# Patient Record
Sex: Male | Born: 1955 | Race: White | Hispanic: No | Marital: Single | Smoking: Never smoker
Health system: Southern US, Community
[De-identification: ages and names within clinical notes are randomized; demographics above are authoritative.]

## PROBLEM LIST (undated history)

## (undated) DIAGNOSIS — F419 Anxiety disorder, unspecified: Secondary | ICD-10-CM

## (undated) DIAGNOSIS — C801 Malignant (primary) neoplasm, unspecified: Secondary | ICD-10-CM

## (undated) DIAGNOSIS — K219 Gastro-esophageal reflux disease without esophagitis: Secondary | ICD-10-CM

## (undated) DIAGNOSIS — M169 Osteoarthritis of hip, unspecified: Secondary | ICD-10-CM

## (undated) DIAGNOSIS — E119 Type 2 diabetes mellitus without complications: Secondary | ICD-10-CM

## (undated) DIAGNOSIS — I509 Heart failure, unspecified: Secondary | ICD-10-CM

## (undated) DIAGNOSIS — K279 Peptic ulcer, site unspecified, unspecified as acute or chronic, without hemorrhage or perforation: Secondary | ICD-10-CM

## (undated) DIAGNOSIS — I10 Essential (primary) hypertension: Secondary | ICD-10-CM

## (undated) DIAGNOSIS — E669 Obesity, unspecified: Secondary | ICD-10-CM

## (undated) DIAGNOSIS — D509 Iron deficiency anemia, unspecified: Secondary | ICD-10-CM

## (undated) HISTORY — PX: GASTRIC BYPASS: SHX52

## (undated) HISTORY — PX: NASAL SINUS SURGERY: SHX719

## (undated) HISTORY — DX: Heart failure, unspecified: I50.9

## (undated) HISTORY — PX: COLONOSCOPY: SHX174

## (undated) HISTORY — DX: Essential (primary) hypertension: I10

## (undated) HISTORY — PX: ESOPHAGOGASTRODUODENOSCOPY: SHX1529

---

## 2015-03-25 ENCOUNTER — Emergency Department: Payer: 59

## 2015-03-25 ENCOUNTER — Emergency Department
Admission: EM | Admit: 2015-03-25 | Discharge: 2015-03-25 | Disposition: A | Discharge status: Home / Self Care | Payer: 59 | Attending: Emergency Medicine | Admitting: Emergency Medicine

## 2015-03-25 ENCOUNTER — Encounter: Payer: Self-pay | Admitting: Occupational Medicine

## 2015-03-25 DIAGNOSIS — Y998 Other external cause status: Secondary | ICD-10-CM | POA: Diagnosis not present

## 2015-03-25 DIAGNOSIS — M5431 Sciatica, right side: Secondary | ICD-10-CM | POA: Diagnosis not present

## 2015-03-25 DIAGNOSIS — M4726 Other spondylosis with radiculopathy, lumbar region: Secondary | ICD-10-CM | POA: Diagnosis not present

## 2015-03-25 DIAGNOSIS — W010XXA Fall on same level from slipping, tripping and stumbling without subsequent striking against object, initial encounter: Secondary | ICD-10-CM | POA: Diagnosis not present

## 2015-03-25 DIAGNOSIS — S90122A Contusion of left lesser toe(s) without damage to nail, initial encounter: Secondary | ICD-10-CM | POA: Diagnosis not present

## 2015-03-25 DIAGNOSIS — Y9389 Activity, other specified: Secondary | ICD-10-CM | POA: Diagnosis not present

## 2015-03-25 DIAGNOSIS — E119 Type 2 diabetes mellitus without complications: Secondary | ICD-10-CM | POA: Insufficient documentation

## 2015-03-25 DIAGNOSIS — Y9289 Other specified places as the place of occurrence of the external cause: Secondary | ICD-10-CM | POA: Diagnosis not present

## 2015-03-25 DIAGNOSIS — S3992XA Unspecified injury of lower back, initial encounter: Secondary | ICD-10-CM | POA: Diagnosis present

## 2015-03-25 HISTORY — DX: Type 2 diabetes mellitus without complications: E11.9

## 2015-03-25 MED ORDER — FENTANYL CITRATE (PF) 100 MCG/2ML IJ SOLN
100.0000 ug | Freq: Once | INTRAMUSCULAR | Status: AC
  Administered 2015-03-25: 100 ug via INTRAVENOUS

## 2015-03-25 MED ORDER — OXYCODONE-ACETAMINOPHEN 5-325 MG PO TABS
ORAL_TABLET | ORAL | Status: AC
  Administered 2015-03-25: 2 via ORAL
  Filled 2015-03-25: qty 2

## 2015-03-25 MED ORDER — OXYCODONE-ACETAMINOPHEN 5-325 MG PO TABS
2.0000 | ORAL_TABLET | Freq: Once | ORAL | Status: AC
  Administered 2015-03-25: 2 via ORAL

## 2015-03-25 MED ORDER — FENTANYL CITRATE (PF) 100 MCG/2ML IJ SOLN
INTRAMUSCULAR | Status: AC
  Administered 2015-03-25: 100 ug via INTRAVENOUS
  Filled 2015-03-25: qty 2

## 2015-03-25 MED ORDER — ONDANSETRON HCL 4 MG/2ML IJ SOLN
4.0000 mg | Freq: Once | INTRAMUSCULAR | Status: AC
  Administered 2015-03-25: 4 mg via INTRAVENOUS
  Filled 2015-03-25: qty 2

## 2015-03-25 MED ORDER — OXYCODONE-ACETAMINOPHEN 5-325 MG PO TABS: 2.0000 | ORAL_TABLET | Freq: Four times a day (QID) | ORAL | Status: DC | PRN

## 2015-03-25 MED ORDER — FENTANYL CITRATE (PF) 100 MCG/2ML IJ SOLN
50.0000 ug | Freq: Once | INTRAMUSCULAR | Status: AC
  Administered 2015-03-25: 50 ug via INTRAVENOUS
  Filled 2015-03-25: qty 2

## 2015-03-25 NOTE — ED Provider Notes (Signed)
[facility] Emergency Department Provider Note  ____________________________________________  Time seen: 5:30 AM  I have reviewed the triage vital signs and the nursing notes.   HISTORY  Chief Complaint Back Pain      HPI William Gorter. is a 59 y.o. male presents status post accidental trip and fall with 10 out of 10 low back pain that radiating down the posterior right leg as well as left third toe pain. Patient denies any weakness no numbness.     Past medical history Obesity  Past surgical history Gastric bypass  Allergies Opium  No family history on file.  Social History History  Substance Use Topics  . Smoking status: Not on file  . Smokeless tobacco: Not on file  . Alcohol Use: Not on file    Review of Systems  Constitutional: Negative for fever. Eyes: Negative for visual changes. ENT: Negative for sore throat. Cardiovascular: Negative for chest pain. Respiratory: Negative for shortness of breath. Gastrointestinal: Negative for abdominal pain, vomiting and diarrhea. Genitourinary: Negative for dysuria. Musculoskeletal: Positive for back pain and left third toe pain Skin: Negative for rash. Neurological: Negative for headaches, focal weakness or numbness.   10-point ROS otherwise negative.  ____________________________________________   PHYSICAL EXAM:  VITAL SIGNS: ED Triage Vitals  Enc Vitals Group     BP 03/25/15 0428 134/89 mmHg     Pulse Rate 03/25/15 0428 74     Resp --      Temp 03/25/15 0428 98.2 F (36.8 C)     Temp Source 03/25/15 0428 Oral     SpO2 03/25/15 0428 97 %     Weight 03/25/15 0428 298 lb (135.172 kg)     Height 03/25/15 0428 5\' 11"  (1.803 m)     Head Cir --      Peak Flow --      Pain Score 03/25/15 0430 10     Pain Loc --      Pain Edu? --      Excl. in [place]? --      Constitutional: Alert and oriented. Apparent distress Eyes: Conjunctivae are normal. PERRL. Normal extraocular  movements. ENT   Head: Normocephalic and atraumatic.   Nose: No congestion/rhinnorhea.   Mouth/Throat: Mucous membranes are moist.   Neck: No stridor. Hematological/Lymphatic/Immunilogical: No cervical lymphadenopathy. Cardiovascular: Normal rate, regular rhythm. Normal and symmetric distal pulses are present in all extremities. No murmurs, rubs, or gallops. Respiratory: Normal respiratory effort without tachypnea nor retractions. Breath sounds are clear and equal bilaterally. No wheezes/rales/rhonchi. Gastrointestinal: Soft and nontender. No distention. There is no CVA tenderness. Genitourinary: deferred Musculoskeletal: Nontender with normal range of motion in all extremities. No joint effusions.  No lower extremity tenderness nor edema. Neurologic:  Normal speech and language. No gross focal neurologic deficits are appreciated. Speech is normal.  Skin:  Skin is warm, dry and intact. No rash noted. Psychiatric: Mood and affect are normal. Speech and behavior are normal. Patient exhibits appropriate insight and judgment.  ____________________________________________     RADIOLOGY Narrative:    CLINICAL DATA: Fall last night, shooting back pain.  EXAM: LUMBAR SPINE - 2-3 VIEW  COMPARISON: None.  FINDINGS: Lumbar vertebral bodies intact, maintenance of lumbar lordosis. Mild L4-5, moderate L5-S1 disc height loss, endplate spurring consistent with degenerative disc. At Least mild facet arthropathy. No destructive bony lesions. Sacroiliac joints are symmetric. Large amount of retained large bowel stool, apparent bowel surgical anastomosis LEFT mid abdomen. Surgical clips at the GE junction.  IMPRESSION:  Degenerative lumbar spine without acute fracture deformity or malalignment.  Large amount of retained large bowel stool partially imaged.   Electronically Signed By: Elon Alas M.D. On: 03/25/2015 06:40          DG Foot Complete Left (Final  result) Result time: 03/25/15 06:38:14   Final result by Rad Results In Interface (03/25/15 06:38:14)   Narrative:   CLINICAL DATA: Fall last night, third digit injury, swelling.  EXAM: LEFT FOOT - COMPLETE 3+ VIEW  COMPARISON: None.  FINDINGS: There is no evidence of fracture or dislocation. Os perineum. Moderate plantar calcaneal spur. There is no evidence of arthropathy or other focal bone abnormality. Soft tissues are unremarkable.  IMPRESSION: Negative.   Electronically Signed By: Elon Alas M.D. On: 03/25/2015 06:38     INITIAL IMPRESSION / ASSESSMENT AND PLAN / ED COURSE  Pertinent labs & imaging results that were available during my care of the patient were reviewed by me and considered in my medical decision making (see chart for details).  Patient received IV fentanyl 25 mcg prior to my evaluation with minimal pain relief as such fentanyl 50 mcg given pain improvement.  ____________________________________________   FINAL CLINICAL IMPRESSION(S) / ED DIAGNOSES  Final diagnoses:  Sciatica, right  Toe contusion, left, initial encounter  Osteoarthritis of spine with radiculopathy, lumbar region      Gregor Hams, MD 03/25/15 [phone]

## 2015-03-25 NOTE — ED Notes (Signed)
Pt to ED via EMS c/o fall. C/o lower back pain and L 3rd toe pain.

## 2015-03-25 NOTE — Discharge Instructions (Signed)
Sciatica Sciatica is pain, weakness, numbness, or tingling along the path of the sciatic nerve. The nerve starts in the lower back and runs down the back of each leg. The nerve controls the muscles in the lower leg and in the back of the knee, while also providing sensation to the back of the thigh, lower leg, and the sole of your foot. Sciatica is a symptom of another medical condition. For instance, nerve damage or certain conditions, such as a herniated disk or bone spur on the spine, pinch or put pressure on the sciatic nerve. This causes the pain, weakness, or other sensations normally associated with sciatica. Generally, sciatica only affects one side of the body. CAUSES   Herniated or slipped disc.  Degenerative disk disease.  A pain disorder involving the narrow muscle in the buttocks (piriformis syndrome).  Pelvic injury or fracture.  Pregnancy.  Tumor (rare). SYMPTOMS  Symptoms can vary from mild to very severe. The symptoms usually travel from the low back to the buttocks and down the back of the leg. Symptoms can include:  Mild tingling or dull aches in the lower back, leg, or hip.  Numbness in the back of the calf or sole of the foot.  Burning sensations in the lower back, leg, or hip.  Sharp pains in the lower back, leg, or hip.  Leg weakness.  Severe back pain inhibiting movement. These symptoms may get worse with coughing, sneezing, laughing, or prolonged sitting or standing. Also, being overweight may worsen symptoms. DIAGNOSIS  Your caregiver will perform a physical exam to look for common symptoms of sciatica. He or she may ask you to do certain movements or activities that would trigger sciatic nerve pain. Other tests may be performed to find the cause of the sciatica. These may include:  Blood tests.  X-rays.  Imaging tests, such as an MRI or CT scan. TREATMENT  Treatment is directed at the cause of the sciatic pain. Sometimes, treatment is not necessary  and the pain and discomfort goes away on its own. If treatment is needed, your caregiver may suggest:  Over-the-counter medicines to relieve pain.  Prescription medicines, such as anti-inflammatory medicine, muscle relaxants, or narcotics.  Applying heat or ice to the painful area.  Steroid injections to lessen pain, irritation, and inflammation around the nerve.  Reducing activity during periods of pain.  Exercising and stretching to strengthen your abdomen and improve flexibility of your spine. Your caregiver may suggest losing weight if the extra weight makes the back pain worse.  Physical therapy.  Surgery to eliminate what is pressing or pinching the nerve, such as a bone spur or part of a herniated disk. HOME CARE INSTRUCTIONS   Only take over-the-counter or prescription medicines for pain or discomfort as directed by your caregiver.  Apply ice to the affected area for 20 minutes, 3-4 times a day for the first 48-72 hours. Then try heat in the same way.  Exercise, stretch, or perform your usual activities if these do not aggravate your pain.  Attend physical therapy sessions as directed by your caregiver.  Keep all follow-up appointments as directed by your caregiver.  Do not wear high heels or shoes that do not provide proper support.  Check your mattress to see if it is too soft. A firm mattress may lessen your pain and discomfort. SEEK IMMEDIATE MEDICAL CARE IF:   You lose control of your bowel or bladder (incontinence).  You have increasing weakness in the lower back, pelvis, buttocks,   or legs.  You have redness or swelling of your back.  You have a burning sensation when you urinate.  You have pain that gets worse when you lie down or awakens you at night.  Your pain is worse than you have experienced in the past.  Your pain is lasting longer than 4 weeks.  You are suddenly losing weight without reason. MAKE SURE YOU:  Understand these  instructions.  Will watch your condition.  Will get help right away if you are not doing well or get worse. Document Released: 08/20/2001 Document Revised: 02/25/2012 Document Reviewed: 01/05/2012 Exit[employer] Patient Information 2015 [employer]. This information is not intended to replace advice given to you by your health care provider. Make sure you discuss any questions you have with your health care provider.  

## 2015-08-29 ENCOUNTER — Other Ambulatory Visit: Payer: Self-pay | Admitting: Internal Medicine

## 2015-08-29 DIAGNOSIS — M5430 Sciatica, unspecified side: Secondary | ICD-10-CM

## 2015-08-29 DIAGNOSIS — M549 Dorsalgia, unspecified: Secondary | ICD-10-CM

## 2015-10-03 ENCOUNTER — Ambulatory Visit
Admission: RE | Admit: 2015-10-03 | Discharge: 2015-10-03 | Disposition: A | Discharge status: Home / Self Care | Payer: 59 | Source: Ambulatory Visit | Attending: Internal Medicine | Admitting: Internal Medicine

## 2015-10-03 DIAGNOSIS — M549 Dorsalgia, unspecified: Secondary | ICD-10-CM | POA: Insufficient documentation

## 2015-10-03 DIAGNOSIS — M5430 Sciatica, unspecified side: Secondary | ICD-10-CM | POA: Insufficient documentation

## 2016-08-07 ENCOUNTER — Encounter
Admission: RE | Admit: 2016-08-07 | Discharge: 2016-08-07 | Disposition: A | Discharge status: Home / Self Care | Payer: 59 | Source: Ambulatory Visit | Attending: Orthopedic Surgery | Admitting: Orthopedic Surgery

## 2016-08-07 DIAGNOSIS — Z01818 Encounter for other preprocedural examination: Secondary | ICD-10-CM | POA: Diagnosis not present

## 2016-08-07 DIAGNOSIS — E119 Type 2 diabetes mellitus without complications: Secondary | ICD-10-CM | POA: Insufficient documentation

## 2016-08-07 HISTORY — DX: Obesity, unspecified: E66.9

## 2016-08-07 HISTORY — DX: Gastro-esophageal reflux disease without esophagitis: K21.9

## 2016-08-07 HISTORY — DX: Osteoarthritis of hip, unspecified: M16.9

## 2016-08-07 HISTORY — DX: Iron deficiency anemia, unspecified: D50.9

## 2016-08-07 HISTORY — DX: Peptic ulcer, site unspecified, unspecified as acute or chronic, without hemorrhage or perforation: K27.9

## 2016-08-07 HISTORY — DX: Anxiety disorder, unspecified: F41.9

## 2016-08-07 LAB — CBC
HCT: 46 % (ref 40.0–52.0)
Hemoglobin: 15.2 g/dL (ref 13.0–18.0)
MCH: 29.2 pg (ref 26.0–34.0)
MCHC: 33.1 g/dL (ref 32.0–36.0)
MCV: 88 fL (ref 80.0–100.0)
PLATELETS: 223 10*3/uL (ref 150–440)
RBC: 5.22 MIL/uL (ref 4.40–5.90)
RDW: 15.4 % — AB (ref 11.5–14.5)
WBC: 8 10*3/uL (ref 3.8–10.6)

## 2016-08-07 LAB — BASIC METABOLIC PANEL
Anion gap: 6 (ref 5–15)
BUN: 15 mg/dL (ref 6–20)
CO2: 30 mmol/L (ref 22–32)
CREATININE: 0.68 mg/dL (ref 0.61–1.24)
Calcium: 9.2 mg/dL (ref 8.9–10.3)
Chloride: 102 mmol/L (ref 101–111)
GFR calc Af Amer: >60 mL/min (ref >60)
GFR calc non Af Amer: >60 mL/min (ref >60)
GLUCOSE: 97 mg/dL (ref 65–99)
Potassium: 4.4 mmol/L (ref 3.5–5.1)
Sodium: 138 mmol/L (ref 135–145)

## 2016-08-07 LAB — URINALYSIS COMPLETE WITH MICROSCOPIC (ARMC ONLY)
Bacteria, UA: NONE SEEN
Bilirubin Urine: NEGATIVE
Glucose, UA: NEGATIVE mg/dL
HGB URINE DIPSTICK: NEGATIVE
KETONES UR: NEGATIVE mg/dL
LEUKOCYTES UA: NEGATIVE
NITRITE: NEGATIVE
PH: 5 (ref 5.0–8.0)
PROTEIN: NEGATIVE mg/dL
SPECIFIC GRAVITY, URINE: 1.02 (ref 1.005–1.030)
Squamous Epithelial / LPF: NONE SEEN

## 2016-08-07 LAB — TYPE AND SCREEN
ABO/RH(D): A POS
Antibody Screen: NEGATIVE

## 2016-08-07 LAB — APTT: aPTT: 31 seconds (ref 24–36)

## 2016-08-07 LAB — SURGICAL PCR SCREEN
MRSA, PCR: NEGATIVE
Staphylococcus aureus: POSITIVE — AB

## 2016-08-07 LAB — PROTIME-INR
INR: 0.94
Prothrombin Time: 12.6 seconds (ref 11.4–15.2)

## 2016-08-07 LAB — SEDIMENTATION RATE: Sed Rate: 6 mm/hr (ref 0–20)

## 2016-08-07 NOTE — Patient Instructions (Addendum)
  Your procedure is scheduled on: Tuesday, August 20, 2016 Report to Same Day Surgery 2nd floor medical mall ([place] Entrance-take elevator on left to 2nd floor.  Check in with surgery information desk.) To find out your arrival time please call [phone] between 1PM - 3PM on Monday, December 11th  Remember: Instructions that are not followed completely may result in serious medical risk, up to and including death, or upon the discretion of your surgeon and anesthesiologist your surgery may need to be rescheduled.    _x___ 1. Do not eat food or drink liquids after midnight. No gum chewing or hard candies.     __x__ 2. No Alcohol for 24 hours before or after surgery.   __x__3. No Smoking for 24 prior to surgery.   ____  4. Bring all medications with you on the day of surgery if instructed.    __x__ 5. Notify your doctor if there is any change in your medical condition     (cold, fever, infections).     Do not wear jewelry, make-up, hairpins, clips or nail polish.  Do not wear lotions, powders, or perfumes. You may wear deodorant.  Do not shave 48 hours prior to surgery. Men may shave face and neck.  Do not bring valuables to the hospital.    [facility] is not responsible for any belongings or valuables.               Contacts, dentures or bridgework may not be worn into surgery.  Leave your suitcase in the car. After surgery it may be brought to your room.  For patients admitted to the hospital, discharge time is determined by your treatment team.   Patients discharged the day of surgery will not be allowed to drive home.  You will need someone to drive you home and stay with you the night of your procedure.    Please read over the following fact sheets that you were given:   [facility] Preparing for Surgery and or MRSA Information   _x___ Take these medicines the morning of surgery with A SIP OF WATER:    1.  PROTONIX (TAKE 1 TABLET THE NIGHT BEFORE AND 1 TABLET THE  MORNING OF SURGERY)  2.  TRAMADOL  3.  4.  5.  6.  ____Fleets enema or Magnesium Citrate as directed.   _x___ Use CHG Soap or sage wipes as directed on instruction sheet   ____ Use inhalers on the day of surgery and bring to hospital day of surgery  __X__ Stop metformin 2 days prior to surgery. LAST DOSE WILL BE ON Saturday, December 9th.   ____ Take 1/2 of usual insulin dose the night before surgery and none on the morning of           surgery.   ____ Stop Aspirin, Coumadin, Pllavix ,Eliquis, Effient, or Pradaxa  x__ Stop Anti-inflammatories such as Advil, Aleve, Ibuprofen, Motrin, Naproxen,          Naprosyn, Goodies powders or aspirin products. Ok to take Tylenol.   ____ Stop supplements until after surgery.    ____ Bring C-Pap to the hospital.

## 2016-08-07 NOTE — Pre-Procedure Instructions (Signed)
MEDICAL CLEARANCE REQUEST/EKG,AS INSTRUCTED BY DR J ADAMS CALLED AND FAXED TO DR B Caryl Comes. SPOKE WITH CONNIE ALSO FAXED AND CALLED TO CINDY AT DR [facility]

## 2016-08-08 LAB — URINE CULTURE: CULTURE: NO GROWTH

## 2016-08-08 NOTE — Pre-Procedure Instructions (Signed)
Positive staph called to dr Theodore Demark office No LMP for male patient. for William [name]

## 2016-08-13 NOTE — Pre-Procedure Instructions (Signed)
Referred by dr b Caryl Comes to cardiology and seen by dr Nehemiah Massed with echo done 08/12/16. To have stress test and see dr Nehemiah Massed again 08/15/16

## 2016-08-19 MED ORDER — TRANEXAMIC ACID 1000 MG/10ML IV SOLN
1500.0000 mg | INTRAVENOUS | Status: AC
  Administered 2016-08-20: 1500 mg via INTRAVENOUS
  Filled 2016-08-19: qty 15

## 2016-08-19 MED ORDER — CEFAZOLIN SODIUM-DEXTROSE 2-4 GM/100ML-% IV SOLN
2.0000 g | Freq: Once | INTRAVENOUS | Status: AC
  Administered 2016-08-20: 2 g via INTRAVENOUS

## 2016-08-19 NOTE — Pre-Procedure Instructions (Signed)
CLEARED BY DR KOWALSKI LOW RISK 

## 2016-08-20 ENCOUNTER — Encounter: Payer: Self-pay | Admitting: Anesthesiology

## 2016-08-20 ENCOUNTER — Inpatient Hospital Stay: Payer: 59 | Admitting: Certified Registered Nurse Anesthetist

## 2016-08-20 ENCOUNTER — Inpatient Hospital Stay: Payer: 59

## 2016-08-20 ENCOUNTER — Inpatient Hospital Stay
Admission: RE | Admit: 2016-08-20 | Discharge: 2016-08-22 | DRG: 470 | Disposition: A | Discharge status: Skilled Nursing Facility | Payer: 59 | Source: Ambulatory Visit | Attending: Orthopedic Surgery | Admitting: Orthopedic Surgery

## 2016-08-20 ENCOUNTER — Encounter
Admission: RE | Disposition: A | Discharge status: Skilled Nursing Facility | Payer: Self-pay | Source: Ambulatory Visit | Attending: Orthopedic Surgery

## 2016-08-20 DIAGNOSIS — Z8711 Personal history of peptic ulcer disease: Secondary | ICD-10-CM | POA: Diagnosis not present

## 2016-08-20 DIAGNOSIS — M1611 Unilateral primary osteoarthritis, right hip: Principal | ICD-10-CM | POA: Diagnosis present

## 2016-08-20 DIAGNOSIS — F419 Anxiety disorder, unspecified: Secondary | ICD-10-CM | POA: Diagnosis present

## 2016-08-20 DIAGNOSIS — M6281 Muscle weakness (generalized): Secondary | ICD-10-CM

## 2016-08-20 DIAGNOSIS — D509 Iron deficiency anemia, unspecified: Secondary | ICD-10-CM | POA: Diagnosis present

## 2016-08-20 DIAGNOSIS — R262 Difficulty in walking, not elsewhere classified: Secondary | ICD-10-CM

## 2016-08-20 DIAGNOSIS — Z419 Encounter for procedure for purposes other than remedying health state, unspecified: Secondary | ICD-10-CM

## 2016-08-20 DIAGNOSIS — Z9884 Bariatric surgery status: Secondary | ICD-10-CM | POA: Diagnosis not present

## 2016-08-20 DIAGNOSIS — G8918 Other acute postprocedural pain: Secondary | ICD-10-CM

## 2016-08-20 DIAGNOSIS — E669 Obesity, unspecified: Secondary | ICD-10-CM | POA: Diagnosis present

## 2016-08-20 DIAGNOSIS — E119 Type 2 diabetes mellitus without complications: Secondary | ICD-10-CM | POA: Diagnosis present

## 2016-08-20 DIAGNOSIS — D62 Acute posthemorrhagic anemia: Secondary | ICD-10-CM | POA: Diagnosis not present

## 2016-08-20 DIAGNOSIS — M25551 Pain in right hip: Secondary | ICD-10-CM | POA: Diagnosis present

## 2016-08-20 DIAGNOSIS — Z7984 Long term (current) use of oral hypoglycemic drugs: Secondary | ICD-10-CM | POA: Diagnosis not present

## 2016-08-20 DIAGNOSIS — Z833 Family history of diabetes mellitus: Secondary | ICD-10-CM | POA: Diagnosis not present

## 2016-08-20 DIAGNOSIS — K219 Gastro-esophageal reflux disease without esophagitis: Secondary | ICD-10-CM | POA: Diagnosis present

## 2016-08-20 DIAGNOSIS — Z6839 Body mass index (BMI) 39.0-39.9, adult: Secondary | ICD-10-CM | POA: Diagnosis not present

## 2016-08-20 HISTORY — PX: TOTAL HIP ARTHROPLASTY: SHX124

## 2016-08-20 LAB — GLUCOSE, CAPILLARY
GLUCOSE-CAPILLARY: 155 mg/dL — AB (ref 65–99)
Glucose-Capillary: 110 mg/dL — ABNORMAL HIGH (ref 65–99)
Glucose-Capillary: 107 mg/dL — ABNORMAL HIGH (ref 65–99)
Glucose-Capillary: 153 mg/dL — ABNORMAL HIGH (ref 65–99)

## 2016-08-20 LAB — CBC
HEMATOCRIT: 40.2 % (ref 40.0–52.0)
Hemoglobin: 13.8 g/dL (ref 13.0–18.0)
MCH: 29.5 pg (ref 26.0–34.0)
MCHC: 34.3 g/dL (ref 32.0–36.0)
MCV: 85.9 fL (ref 80.0–100.0)
PLATELETS: 203 10*3/uL (ref 150–440)
RBC: 4.68 MIL/uL (ref 4.40–5.90)
RDW: 15.2 % — AB (ref 11.5–14.5)
WBC: 9.4 10*3/uL (ref 3.8–10.6)

## 2016-08-20 LAB — CREATININE, SERUM
Creatinine, Ser: 0.6 mg/dL — ABNORMAL LOW (ref 0.61–1.24)
GFR calc non Af Amer: >60 mL/min (ref >60)

## 2016-08-20 LAB — ABO/RH: ABO/RH(D): A POS

## 2016-08-20 SURGERY — ARTHROPLASTY, HIP, TOTAL, ANTERIOR APPROACH
Anesthesia: Spinal | Laterality: Right | Wound class: Clean

## 2016-08-20 MED ORDER — ACETAMINOPHEN 10 MG/ML IV SOLN
INTRAVENOUS | Status: DC | PRN
  Administered 2016-08-20: 1000 mg via INTRAVENOUS

## 2016-08-20 MED ORDER — NEOMYCIN-POLYMYXIN B GU 40-200000 IR SOLN
Status: DC | PRN
  Administered 2016-08-20: 4 mL

## 2016-08-20 MED ORDER — VITAMIN B-12 1000 MCG PO TABS
2500.0000 ug | ORAL_TABLET | Freq: Every day | ORAL | Status: DC
  Administered 2016-08-21 – 2016-08-22 (×2): 2500 ug via ORAL
  Filled 2016-08-20 (×2): qty 3

## 2016-08-20 MED ORDER — BUPIVACAINE-EPINEPHRINE (PF) 0.25% -1:200000 IJ SOLN
INTRAMUSCULAR | Status: AC
  Filled 2016-08-20: qty 30

## 2016-08-20 MED ORDER — ZOLPIDEM TARTRATE 5 MG PO TABS
5.0000 mg | ORAL_TABLET | Freq: Every evening | ORAL | Status: DC | PRN
Start: 2016-08-20 — End: 2016-08-22
  Administered 2016-08-21 (×2): 5 mg via ORAL
  Filled 2016-08-20 (×2): qty 1

## 2016-08-20 MED ORDER — MAGNESIUM HYDROXIDE 400 MG/5ML PO SUSP
30.0000 mL | Freq: Every day | ORAL | Status: DC | PRN
  Administered 2016-08-21 – 2016-08-22 (×2): 30 mL via ORAL
  Filled 2016-08-20 (×2): qty 30

## 2016-08-20 MED ORDER — METHOCARBAMOL 500 MG PO TABS
500.0000 mg | ORAL_TABLET | Freq: Four times a day (QID) | ORAL | Status: DC | PRN
  Administered 2016-08-20 – 2016-08-21 (×5): 500 mg via ORAL
  Filled 2016-08-20 (×5): qty 1

## 2016-08-20 MED ORDER — ADULT MULTIVITAMIN W/MINERALS CH
1.0000 | ORAL_TABLET | Freq: Every day | ORAL | Status: DC
  Administered 2016-08-21 – 2016-08-22 (×2): 1 via ORAL
  Filled 2016-08-20 (×2): qty 1

## 2016-08-20 MED ORDER — FAMOTIDINE 20 MG PO TABS
20.0000 mg | ORAL_TABLET | Freq: Once | ORAL | Status: AC
  Administered 2016-08-20: 20 mg via ORAL

## 2016-08-20 MED ORDER — FLUOXETINE HCL 20 MG PO CAPS
20.0000 mg | ORAL_CAPSULE | Freq: Every evening | ORAL | Status: DC
  Administered 2016-08-20 – 2016-08-21 (×2): 20 mg via ORAL
  Filled 2016-08-20 (×2): qty 1

## 2016-08-20 MED ORDER — KETOROLAC TROMETHAMINE 15 MG/ML IJ SOLN
15.0000 mg | Freq: Once | INTRAMUSCULAR | Status: AC
  Administered 2016-08-20: 15 mg via INTRAVENOUS
  Filled 2016-08-20: qty 1

## 2016-08-20 MED ORDER — DIPHENHYDRAMINE HCL 12.5 MG/5ML PO ELIX
12.5000 mg | ORAL_SOLUTION | ORAL | Status: DC | PRN
Start: 2016-08-20 — End: 2016-08-22

## 2016-08-20 MED ORDER — METOCLOPRAMIDE HCL 10 MG PO TABS: 5.0000 mg | ORAL_TABLET | Freq: Three times a day (TID) | ORAL | Status: DC | PRN

## 2016-08-20 MED ORDER — SODIUM CHLORIDE 0.9 % IV SOLN
INTRAVENOUS | Status: DC
  Administered 2016-08-20 (×2): via INTRAVENOUS

## 2016-08-20 MED ORDER — KETAMINE HCL 50 MG/ML IJ SOLN
INTRAMUSCULAR | Status: DC | PRN
  Administered 2016-08-20: 25 mg via INTRAMUSCULAR

## 2016-08-20 MED ORDER — DOCUSATE SODIUM 100 MG PO CAPS
100.0000 mg | ORAL_CAPSULE | Freq: Two times a day (BID) | ORAL | Status: DC
  Administered 2016-08-20 – 2016-08-22 (×4): 100 mg via ORAL
  Filled 2016-08-20 (×4): qty 1

## 2016-08-20 MED ORDER — OXYCODONE HCL ER 20 MG PO T12A
20.0000 mg | EXTENDED_RELEASE_TABLET | Freq: Two times a day (BID) | ORAL | Status: DC
Start: 2016-08-20 — End: 2016-08-22
  Administered 2016-08-20 – 2016-08-22 (×4): 20 mg via ORAL
  Filled 2016-08-20 (×4): qty 1

## 2016-08-20 MED ORDER — ONDANSETRON HCL 4 MG/2ML IJ SOLN
INTRAMUSCULAR | Status: DC | PRN
  Administered 2016-08-20: 4 mg via INTRAVENOUS

## 2016-08-20 MED ORDER — FERROUS SULFATE 325 (65 FE) MG PO TABS
54.0000 mg | ORAL_TABLET | Freq: Every day | ORAL | Status: DC
  Administered 2016-08-21 – 2016-08-22 (×2): 325 mg via ORAL
  Filled 2016-08-20 (×2): qty 1

## 2016-08-20 MED ORDER — ACETAMINOPHEN 325 MG PO TABS: 650.0000 mg | ORAL_TABLET | Freq: Four times a day (QID) | ORAL | Status: DC | PRN

## 2016-08-20 MED ORDER — PROPOFOL 10 MG/ML IV BOLUS
INTRAVENOUS | Status: DC | PRN
  Administered 2016-08-20 (×2): 30 mg via INTRAVENOUS
  Administered 2016-08-20: 20 mg via INTRAVENOUS

## 2016-08-20 MED ORDER — FENTANYL CITRATE (PF) 100 MCG/2ML IJ SOLN
25.0000 ug | INTRAMUSCULAR | Status: DC | PRN
  Administered 2016-08-20 (×2): 25 ug via INTRAVENOUS

## 2016-08-20 MED ORDER — HYDROMORPHONE HCL 1 MG/ML IJ SOLN
1.0000 mg | INTRAMUSCULAR | Status: DC | PRN
  Administered 2016-08-20 – 2016-08-21 (×3): 1 mg via INTRAVENOUS
  Filled 2016-08-20 (×3): qty 1

## 2016-08-20 MED ORDER — ONDANSETRON HCL 4 MG/2ML IJ SOLN: 4.0000 mg | Freq: Four times a day (QID) | INTRAMUSCULAR | Status: DC | PRN

## 2016-08-20 MED ORDER — BIOTIN 10000 MCG PO TABS: 10000.0000 ug | ORAL_TABLET | Freq: Every day | ORAL | Status: DC

## 2016-08-20 MED ORDER — ACETAMINOPHEN 10 MG/ML IV SOLN
INTRAVENOUS | Status: AC
  Filled 2016-08-20: qty 100

## 2016-08-20 MED ORDER — CEFAZOLIN SODIUM-DEXTROSE 2-4 GM/100ML-% IV SOLN
INTRAVENOUS | Status: AC
  Filled 2016-08-20: qty 100

## 2016-08-20 MED ORDER — BISACODYL 10 MG RE SUPP
10.0000 mg | Freq: Every day | RECTAL | Status: DC | PRN
  Administered 2016-08-22: 10 mg via RECTAL
  Filled 2016-08-20: qty 1

## 2016-08-20 MED ORDER — METFORMIN HCL 500 MG PO TABS: 500.0000 mg | ORAL_TABLET | Freq: Two times a day (BID) | ORAL | Status: DC

## 2016-08-20 MED ORDER — ONDANSETRON HCL 4 MG/2ML IJ SOLN: 4.0000 mg | Freq: Once | INTRAMUSCULAR | Status: DC | PRN

## 2016-08-20 MED ORDER — PHENYLEPHRINE HCL 10 MG/ML IJ SOLN
INTRAMUSCULAR | Status: DC | PRN
  Administered 2016-08-20: 100 ug via INTRAVENOUS
  Administered 2016-08-20: 50 ug via INTRAVENOUS
  Administered 2016-08-20: 100 ug via INTRAVENOUS

## 2016-08-20 MED ORDER — LIDOCAINE HCL (CARDIAC) 20 MG/ML IV SOLN
INTRAVENOUS | Status: DC | PRN
  Administered 2016-08-20: 50 mg via INTRAVENOUS

## 2016-08-20 MED ORDER — FENTANYL CITRATE (PF) 100 MCG/2ML IJ SOLN
INTRAMUSCULAR | Status: AC
  Administered 2016-08-20: 25 ug via INTRAVENOUS
  Filled 2016-08-20: qty 2

## 2016-08-20 MED ORDER — MIDAZOLAM HCL 2 MG/2ML IJ SOLN
1.0000 mg | Freq: Once | INTRAMUSCULAR | Status: AC
  Administered 2016-08-20: 1 mg via INTRAVENOUS

## 2016-08-20 MED ORDER — SODIUM CHLORIDE 0.9 % IV SOLN
INTRAVENOUS | Status: DC
  Administered 2016-08-20 (×2): via INTRAVENOUS

## 2016-08-20 MED ORDER — MIDAZOLAM HCL 2 MG/2ML IJ SOLN
INTRAMUSCULAR | Status: AC
  Administered 2016-08-20: 1 mg via INTRAVENOUS
  Filled 2016-08-20: qty 2

## 2016-08-20 MED ORDER — PANTOPRAZOLE SODIUM 40 MG PO TBEC
40.0000 mg | DELAYED_RELEASE_TABLET | Freq: Every day | ORAL | Status: DC
  Administered 2016-08-21 – 2016-08-22 (×2): 40 mg via ORAL
  Filled 2016-08-20 (×2): qty 1

## 2016-08-20 MED ORDER — OXYCODONE HCL 5 MG PO TABS
5.0000 mg | ORAL_TABLET | ORAL | Status: DC | PRN
  Administered 2016-08-20 (×3): 5 mg via ORAL
  Administered 2016-08-20 – 2016-08-22 (×6): 10 mg via ORAL
  Filled 2016-08-20: qty 2
  Filled 2016-08-20: qty 1
  Filled 2016-08-20 (×4): qty 2
  Filled 2016-08-20: qty 1
  Filled 2016-08-20 (×2): qty 2

## 2016-08-20 MED ORDER — MENTHOL 3 MG MT LOZG
1.0000 | LOZENGE | OROMUCOSAL | Status: DC | PRN
  Filled 2016-08-20: qty 9

## 2016-08-20 MED ORDER — INSULIN ASPART 100 UNIT/ML ~~LOC~~ SOLN
0.0000 [IU] | Freq: Three times a day (TID) | SUBCUTANEOUS | Status: DC
  Administered 2016-08-20 – 2016-08-21 (×2): 3 [IU] via SUBCUTANEOUS
  Administered 2016-08-21 (×2): 2 [IU] via SUBCUTANEOUS
  Administered 2016-08-22: 3 [IU] via SUBCUTANEOUS
  Filled 2016-08-20: qty 3
  Filled 2016-08-20: qty 2
  Filled 2016-08-20 (×2): qty 3
  Filled 2016-08-20: qty 2

## 2016-08-20 MED ORDER — PHENOL 1.4 % MT LIQD
1.0000 | OROMUCOSAL | Status: DC | PRN
  Filled 2016-08-20: qty 177

## 2016-08-20 MED ORDER — METFORMIN HCL 500 MG PO TABS
1000.0000 mg | ORAL_TABLET | Freq: Every day | ORAL | Status: DC
  Administered 2016-08-21 – 2016-08-22 (×2): 1000 mg via ORAL
  Filled 2016-08-20 (×2): qty 2

## 2016-08-20 MED ORDER — FENTANYL CITRATE (PF) 100 MCG/2ML IJ SOLN
INTRAMUSCULAR | Status: DC | PRN
  Administered 2016-08-20 (×2): 50 ug via INTRAVENOUS

## 2016-08-20 MED ORDER — ONDANSETRON HCL 4 MG PO TABS: 4.0000 mg | ORAL_TABLET | Freq: Four times a day (QID) | ORAL | Status: DC | PRN

## 2016-08-20 MED ORDER — FAMOTIDINE 20 MG PO TABS
ORAL_TABLET | ORAL | Status: AC
  Administered 2016-08-20: 20 mg via ORAL
  Filled 2016-08-20: qty 1

## 2016-08-20 MED ORDER — METOCLOPRAMIDE HCL 5 MG/ML IJ SOLN: 5.0000 mg | Freq: Three times a day (TID) | INTRAMUSCULAR | Status: DC | PRN

## 2016-08-20 MED ORDER — METHOCARBAMOL 1000 MG/10ML IJ SOLN
500.0000 mg | Freq: Four times a day (QID) | INTRAVENOUS | Status: DC | PRN
  Filled 2016-08-20: qty 5

## 2016-08-20 MED ORDER — BUPIVACAINE-EPINEPHRINE 0.25% -1:200000 IJ SOLN
INTRAMUSCULAR | Status: DC | PRN
  Administered 2016-08-20: 30 mL

## 2016-08-20 MED ORDER — ACETAMINOPHEN 650 MG RE SUPP: 650.0000 mg | Freq: Four times a day (QID) | RECTAL | Status: DC | PRN

## 2016-08-20 MED ORDER — CEFAZOLIN SODIUM-DEXTROSE 2-4 GM/100ML-% IV SOLN
2.0000 g | Freq: Four times a day (QID) | INTRAVENOUS | Status: AC
  Administered 2016-08-20 – 2016-08-21 (×3): 2 g via INTRAVENOUS
  Filled 2016-08-20 (×3): qty 100

## 2016-08-20 MED ORDER — FAMOTIDINE 20 MG PO TABS
20.0000 mg | ORAL_TABLET | Freq: Two times a day (BID) | ORAL | Status: DC
  Administered 2016-08-21 – 2016-08-22 (×3): 20 mg via ORAL
  Filled 2016-08-20 (×3): qty 1

## 2016-08-20 MED ORDER — MAGNESIUM CITRATE PO SOLN
1.0000 | Freq: Once | ORAL | Status: DC | PRN
  Filled 2016-08-20: qty 296

## 2016-08-20 MED ORDER — METFORMIN HCL 500 MG PO TABS
500.0000 mg | ORAL_TABLET | Freq: Every day | ORAL | Status: DC
  Administered 2016-08-20 – 2016-08-21 (×2): 500 mg via ORAL
  Filled 2016-08-20 (×2): qty 1

## 2016-08-20 MED ORDER — BUPIVACAINE IN DEXTROSE 0.75-8.25 % IT SOLN
INTRATHECAL | Status: DC | PRN
Start: 2016-08-20 — End: 2016-08-20
  Administered 2016-08-20: 1.6 mL via INTRATHECAL

## 2016-08-20 MED ORDER — NEOMYCIN-POLYMYXIN B GU 40-200000 IR SOLN
Status: AC
  Filled 2016-08-20: qty 4

## 2016-08-20 MED ORDER — MIDAZOLAM HCL 5 MG/5ML IJ SOLN
INTRAMUSCULAR | Status: DC | PRN
  Administered 2016-08-20: 2 mg via INTRAVENOUS

## 2016-08-20 MED ORDER — PROPOFOL 500 MG/50ML IV EMUL
INTRAVENOUS | Status: DC | PRN
  Administered 2016-08-20: 75 ug/kg/min via INTRAVENOUS

## 2016-08-20 MED ORDER — ENOXAPARIN SODIUM 40 MG/0.4ML ~~LOC~~ SOLN
40.0000 mg | SUBCUTANEOUS | Status: DC
  Administered 2016-08-21 – 2016-08-22 (×2): 40 mg via SUBCUTANEOUS
  Filled 2016-08-20 (×2): qty 0.4

## 2016-08-20 MED ORDER — ALUM & MAG HYDROXIDE-SIMETH 200-200-20 MG/5ML PO SUSP: 30.0000 mL | ORAL | Status: DC | PRN

## 2016-08-20 SURGICAL SUPPLY — 49 items
BLADE SAW SAG 18.5X105 (BLADE) ×2 IMPLANT
BNDG COHESIVE 6X5 TAN STRL LF (GAUZE/BANDAGES/DRESSINGS) ×4 IMPLANT
CANISTER SUCT 1200ML W/VALVE (MISCELLANEOUS) ×2 IMPLANT
CAPT HIP TOTAL 3 ×2 IMPLANT
CATH FOL LEG HOLDER (MISCELLANEOUS) ×2 IMPLANT
CATH TRAY METER 16FR LF (MISCELLANEOUS) ×2 IMPLANT
CHLORAPREP W/TINT 26ML (MISCELLANEOUS) ×2 IMPLANT
DRAPE C-ARM XRAY 36X54 (DRAPES) ×2 IMPLANT
DRAPE INCISE IOBAN 66X60 STRL (DRAPES) IMPLANT
DRAPE POUCH INSTRU U-SHP 10X18 (DRAPES) ×2 IMPLANT
DRAPE SHEET LG 3/4 BI-LAMINATE (DRAPES) ×6 IMPLANT
DRAPE STERI IOBAN 125X83 (DRAPES) ×2 IMPLANT
DRAPE TABLE BACK 80X90 (DRAPES) ×2 IMPLANT
DRSG OPSITE POSTOP 4X8 (GAUZE/BANDAGES/DRESSINGS) ×4 IMPLANT
ELECT BLADE 6.5 EXT (BLADE) ×2 IMPLANT
ELECT REM PT RETURN 9FT ADLT (ELECTROSURGICAL) ×2
ELECTRODE REM PT RTRN 9FT ADLT (ELECTROSURGICAL) ×1 IMPLANT
GAUZE SPONGE 4X4 12PLY STRL (GAUZE/BANDAGES/DRESSINGS) ×2 IMPLANT
GLOVE BIOGEL PI IND STRL 9 (GLOVE) ×1 IMPLANT
GLOVE BIOGEL PI INDICATOR 9 (GLOVE) ×1
GLOVE SURG SYN 9.0  PF PI (GLOVE) ×2
GLOVE SURG SYN 9.0 PF PI (GLOVE) ×2 IMPLANT
GOWN SRG 2XL LVL 4 RGLN SLV (GOWNS) ×1 IMPLANT
GOWN STRL NON-REIN 2XL LVL4 (GOWNS) ×1
GOWN STRL REUS W/ TWL LRG LVL3 (GOWN DISPOSABLE) ×1 IMPLANT
GOWN STRL REUS W/TWL LRG LVL3 (GOWN DISPOSABLE) ×1
HEMOVAC 400CC 10FR (MISCELLANEOUS) IMPLANT
HOOD PEEL AWAY FLYTE STAYCOOL (MISCELLANEOUS) ×2 IMPLANT
KIT PREVENA INCISION MGT 13 (CANNISTER) ×2 IMPLANT
MAT BLUE FLOOR 46X72 FLO (MISCELLANEOUS) ×2 IMPLANT
NDL SAFETY 18GX1.5 (NEEDLE) ×2 IMPLANT
NEEDLE SPNL 18GX3.5 QUINCKE PK (NEEDLE) ×2 IMPLANT
NS IRRIG 1000ML POUR BTL (IV SOLUTION) ×2 IMPLANT
PACK HIP COMPR (MISCELLANEOUS) ×2 IMPLANT
SOL PREP PVP 2OZ (MISCELLANEOUS) ×2
SOLUTION PREP PVP 2OZ (MISCELLANEOUS) ×1 IMPLANT
STAPLER SKIN PROX 35W (STAPLE) ×2 IMPLANT
STRAP SAFETY BODY (MISCELLANEOUS) ×2 IMPLANT
SUT DVC 2 QUILL PDO  T11 36X36 (SUTURE) ×1
SUT DVC 2 QUILL PDO T11 36X36 (SUTURE) ×1 IMPLANT
SUT DVC QUILL MONODERM 30X30 (SUTURE) ×2 IMPLANT
SUT SILK 0 (SUTURE) ×1
SUT SILK 0 30XBRD TIE 6 (SUTURE) ×1 IMPLANT
SUT VIC AB 1 CT1 36 (SUTURE) ×2 IMPLANT
SYR 20CC LL (SYRINGE) ×2 IMPLANT
SYR 30ML LL (SYRINGE) ×2 IMPLANT
TAPE MICROFOAM 4IN (TAPE) ×2 IMPLANT
TOWEL OR 17X26 4PK STRL BLUE (TOWEL DISPOSABLE) ×2 IMPLANT
TUBE KAMVAC SUCTION (TUBING) IMPLANT

## 2016-08-20 NOTE — Anesthesia Preprocedure Evaluation (Addendum)
Anesthesia Evaluation  Patient identified by MRN, date of birth, ID band Patient awake    Reviewed: Allergy & Precautions, NPO status , Patient's Chart, lab work & pertinent test results, reviewed documented beta blocker date and time   Airway Mallampati: III  TM Distance: >3 FB     Dental  (+) Chipped   Pulmonary           Cardiovascular      Neuro/Psych Anxiety    GI/Hepatic PUD, GERD  Controlled,  Endo/Other  diabetes, Type 2Morbid obesity  Renal/GU      Musculoskeletal  (+) Arthritis ,   Abdominal   Peds  (+) ATTENTION DEFICIT DISORDER WITHOUT HYPERACTIVITY Hematology  (+) anemia ,   Anesthesia Other Findings Bypass. Seen by Nehemiah Massed for the abnormal EKG. Echo, stress test ok. 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  Reproductive/Obstetrics                           Anesthesia Physical Anesthesia Plan  ASA: III  Anesthesia Plan: Spinal   Post-op Pain Management:    Induction:   Airway Management Planned:   Additional Equipment:   Intra-op Plan:   Post-operative Plan:   Informed Consent: I have reviewed the patients History and Physical, chart, labs and discussed the procedure including the risks, benefits and alternatives for the proposed anesthesia with the patient or authorized representative who has indicated his/her understanding and acceptance.     Plan Discussed with: CRNA  Anesthesia Plan Comments:         Anesthesia Quick Evaluation

## 2016-08-20 NOTE — Progress Notes (Signed)

## 2016-08-20 NOTE — Progress Notes (Signed)
Patient given a total of 10mg , Oxycodone, 1mg  of Dilaudid, and Robaxin 500mg , but patient still in a 10/10 pain. Family expressed concerns about something more for pain. Notified Dr. Rudene Christians. Gave a one time order for Toradol 15mg  x1 now and OxyContin 20mg  BID

## 2016-08-20 NOTE — Anesthesia Procedure Notes (Signed)
Spinal  Patient location during procedure: OR Staffing Anesthesiologist: Gunnar Bulla Performed: anesthesiologist  Preanesthetic Checklist Completed: patient identified, site marked, surgical consent, pre-op evaluation, timeout performed, IV checked and risks and benefits discussed Spinal Block Patient position: sitting Prep: Betadine Patient monitoring: heart rate, cardiac monitor, continuous pulse ox and blood pressure Approach: midline Location: L3-4 Injection technique: single-shot Needle Needle type: Pencil-Tip  Needle gauge: 25 G Needle length: 9 cm Assessment Sensory level: T10 Additional Notes 1049 marcaine 1.35ml.

## 2016-08-20 NOTE — Transfer of Care (Signed)
Immediate Anesthesia Transfer of Care Note  Patient: William [name].  Procedure(s) Performed: Procedure(s): TOTAL HIP ARTHROPLASTY ANTERIOR APPROACH (Right)  Patient Location: PACU  Anesthesia Type:Spinal  Level of Consciousness: sedated  Airway & Oxygen Therapy: Patient Spontanous Breathing and Patient connected to face mask oxygen  Post-op Assessment: Report given to RN and Post -op Vital signs reviewed and stable  Post vital signs: Reviewed and stable  Last Vitals:  Vitals:   08/20/16 0924 08/20/16 1231  BP: (!) 149/71 113/68  Pulse: 84 74  Resp: 16 15  Temp: 36.9 C 36.2 C    Last Pain:  Vitals:   08/20/16 1231  TempSrc: Tympanic  PainSc:          Complications: No apparent anesthesia complications

## 2016-08-20 NOTE — Anesthesia Procedure Notes (Signed)
Date/Time: 08/20/2016 10:57 AM Performed by: Johnna Acosta Pre-anesthesia Checklist: Patient identified, Emergency Drugs available, Suction available, Patient being monitored and Timeout performed Oxygen Delivery Method: Simple face mask

## 2016-08-20 NOTE — H&P (Signed)
Reviewed paper H+P, will be scanned into chart. No changes noted.  

## 2016-08-20 NOTE — Progress Notes (Signed)
Patient is admitted to room 156 from surgery via room bed and OR staff. Prevena wound vac, NSL, TEDs, foot pumps, in place from OR. Bed alarm on for safety. Family at bedside. POC initiated.

## 2016-08-20 NOTE — Op Note (Signed)
08/20/2016  12:32 PM  PATIENT:  William [name].  60 y.o. male  PRE-OPERATIVE DIAGNOSIS:  OSTEOARTHRITIS HIP  POST-OPERATIVE DIAGNOSIS:  OSTEOARTHRITIS HIP  PROCEDURE:  Procedure(s): TOTAL HIP ARTHROPLASTY ANTERIOR APPROACH (Right)  SURGEON: Laurene Footman, MD  ASSISTANTS: none  ANESTHESIA:   spinal  EBL:  Total I/O In: 1000 [I.V.:1000] Out: 550 [Urine:350; Blood:200]  BLOOD ADMINISTERED:none  DRAINS: none   LOCAL MEDICATIONS USED:  MARCAINE     SPECIMEN:  Source of Specimen:  right femoral head  DISPOSITION OF SPECIMEN:  PATHOLOGY  COUNTS:  YES  TOURNIQUET:  * No tourniquets in log *  IMPLANTS: Medacta 6 standard AMIS stem, 60 mm Mpact cup DM with liner, M 28 mm head  DICTATION: .Dragon Dictation   The patient was brought to the operating room and after spinal anesthesia was obtained patient was placed on the operative table with the ipsilateral foot into the Medacta attachment, contralateral leg on a well-padded table. C-arm was brought in and preop template x-ray taken. After prepping and draping in usual sterile fashion appropriate patient identification and timeout procedures were completed. Anterior approach to the hip was obtained and centered over the greater trochanter and TFL muscle. The subcutaneous tissue was incised hemostasis being achieved by electrocautery. TFL fascia was incised and the muscle retracted laterally deep retractor placed. The lateral femoral circumflex vessels were identified and ligated. The anterior capsule was exposed and a capsulotomy performed. The neck was identified and a femoral neck cut carried out with a saw. The head was removed without difficulty and showed sclerotic femoral head and acetabulum. Reaming was carried out to 58 mm and a 60 mm cup trial gave appropriate tightness to the acetabular component a 60 cup was impacted into position. The leg was then externally rotated and ischiofemoral and pubofemoral releases carried out. The  femur was sequentially broached to a size 6, size 6 standard and M head trials were placed and the final components chosen. The 6 standard stem was inserted along with a M 28 mm head and 60 mm liner. The hip was reduced and was stable the wound was thoroughly irrigated with a dilute Betadine solution. The deep fascia view. Using a heavy Quill after infiltration of 30 cc of quarter percent Sensorcaine with epinephrine. 3-0 V-locl to close the skin with skin staples followed by wound vac Provena  PLAN OF CARE: Admit to inpatient

## 2016-08-20 NOTE — NC FL2 (Signed)
[place] LEVEL OF CARE SCREENING TOOL     IDENTIFICATION  Patient Name: William [name]. Birthdate: [birth year] Sex: male Admission Date (Current Location): 08/20/2016  [place] and [place] Number:  Engineering geologist and Address:  [facility], [address], [place], [place] [address]      Provider Number: [id number]  Attending Physician Name and Address:  Hessie Knows, MD  Relative Name and Phone Number:       Current Level of Care: Hospital Recommended Level of Care: [place] Prior Approval Number:    Date Approved/Denied:   PASRR Number:  ([id number])  Discharge Plan: SNF    Current Diagnoses: Patient Active Problem List   Diagnosis Date Noted  . 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  Obesity, unspecified    Encounter for blood transfusion    PUD (peptic ulcer disease)    Anxiety, unspecified    Diabetes mellitus type 2, uncomplicated (CMS-HCC)    Gastroesophageal reflux disease without esophagitis 10/28/2014   Lumbar degenerative disc disease  on films in ER after fall on 7/16  Anemia, iron deficiency       Orientation RESPIRATION BLADDER Height & Weight     Self, Time, Situation, Place  Normal Continent Weight: 285 lb (129.3 kg) Height:  5\' 11"  (180.3 cm)  BEHAVIORAL SYMPTOMS/MOOD NEUROLOGICAL BOWEL NUTRITION STATUS   (none)  (none) Continent Diet (Diet: Clear Liquid )  AMBULATORY STATUS COMMUNICATION OF NEEDS Skin   Extensive Assist Verbally Surgical wounds, Wound Vac (Incision: Right Hip. Provena wound vac disposable. )                       Personal Care Assistance Level of Assistance  Bathing, Feeding, Dressing Bathing Assistance: Limited assistance Feeding assistance: Independent Dressing Assistance: Limited assistance     Functional Limitations Info  Sight, Hearing, Speech Sight Info: Adequate Hearing  Info: Adequate Speech Info: Adequate    SPECIAL CARE FACTORS FREQUENCY  PT (By licensed PT), OT (By licensed OT)     PT Frequency:  (5) OT Frequency:  (5)            Contractures      Additional Factors Info  Code Status, Allergies Code Status Info:  (Full Code. ) Allergies Info:  (Morphine And Related, Nsaids, Opium)           Current Medications (08/20/2016):  This is the current hospital active medication list Current Facility-Administered Medications  Medication Dose Route Frequency Provider Last Rate Last Dose  . 0.9 %  sodium chloride infusion   Intravenous Continuous Hessie Knows, MD 100 mL/hr at 08/20/16 1421    . acetaminophen (TYLENOL) tablet 650 mg  650 mg Oral Q6H PRN Hessie Knows, MD       Or  . acetaminophen (TYLENOL) suppository 650 mg  650 mg Rectal Q6H PRN Hessie Knows, MD      . alum & mag hydroxide-simeth (MAALOX/MYLANTA) 200-200-20 MG/5ML suspension 30 mL  30 mL Oral Q4H PRN Hessie Knows, MD      . bisacodyl (DULCOLAX) suppository 10 mg  10 mg Rectal Daily PRN Hessie Knows, MD      . ceFAZolin (ANCEF) IVPB 2g/100 mL premix  2 g Intravenous Q6H Hessie Knows, MD      . diphenhydrAMINE (BENADRYL) 12.5 MG/5ML elixir 12.5-25 mg  12.5-25 mg Oral Q4H PRN Hessie Knows, MD      .  docusate sodium (COLACE) capsule 100 mg  100 mg Oral BID Hessie Knows, MD      . Derrill Memo ON 08/21/2016] enoxaparin (LOVENOX) injection 40 mg  40 mg Subcutaneous Q24H Hessie Knows, MD      . famotidine (PEPCID) tablet 20 mg  20 mg Oral BID Hessie Knows, MD      . ferrous sulfate tablet 325 mg  325 mg Oral Daily Hessie Knows, MD      . FLUoxetine (PROZAC) capsule 20 mg  20 mg Oral QPM Hessie Knows, MD      . HYDROmorphone (DILAUDID) injection 1 mg  1 mg Intravenous Q2H PRN Hessie Knows, MD   1 mg at 08/20/16 1543  . insulin aspart (novoLOG) injection 0-15 Units  0-15 Units Subcutaneous TID WC Hessie Knows, MD      . ketorolac (TORADOL) 15 MG/ML injection 15 mg  15 mg Intravenous Once  Hessie Knows, MD      . magnesium citrate solution 1 Bottle  1 Bottle Oral Once PRN Hessie Knows, MD      . magnesium hydroxide (MILK OF MAGNESIA) suspension 30 mL  30 mL Oral Daily PRN Hessie Knows, MD      . menthol-cetylpyridinium (CEPACOL) lozenge 3 mg  1 lozenge Oral PRN Hessie Knows, MD       Or  . phenol (CHLORASEPTIC) mouth spray 1 spray  1 spray Mouth/Throat PRN Hessie Knows, MD      . Derrill Memo ON 08/21/2016] metFORMIN (GLUCOPHAGE) tablet 1,000 mg  1,000 mg Oral Q breakfast Hessie Knows, MD      . metFORMIN (GLUCOPHAGE) tablet 500 mg  500 mg Oral Q supper Hessie Knows, MD      . methocarbamol (ROBAXIN) tablet 500 mg  500 mg Oral Q6H PRN Hessie Knows, MD   500 mg at 08/20/16 1419   Or  . methocarbamol (ROBAXIN) 500 mg in dextrose 5 % 50 mL IVPB  500 mg Intravenous Q6H PRN Hessie Knows, MD      . metoCLOPramide (REGLAN) tablet 5-10 mg  5-10 mg Oral Q8H PRN Hessie Knows, MD       Or  . metoCLOPramide (REGLAN) injection 5-10 mg  5-10 mg Intravenous Q8H PRN Hessie Knows, MD      . multivitamin with minerals tablet 1 tablet  1 tablet Oral Daily Hessie Knows, MD      . ondansetron [facility]) tablet 4 mg  4 mg Oral Q6H PRN Hessie Knows, MD       Or  . ondansetron [facility]) injection 4 mg  4 mg Intravenous Q6H PRN Hessie Knows, MD      . oxyCODONE (Oxy IR/ROXICODONE) immediate release tablet 5-10 mg  5-10 mg Oral Q3H PRN Hessie Knows, MD   5 mg at 08/20/16 1514  . oxyCODONE (OXYCONTIN) 12 hr tablet 20 mg  20 mg Oral Q12H Hessie Knows, MD      . pantoprazole (PROTONIX) EC tablet 40 mg  40 mg Oral Daily Hessie Knows, MD      . vitamin B-12 (CYANOCOBALAMIN) tablet 2,500 mcg  2,500 mcg Oral Daily Hessie Knows, MD      . zolpidem [facility]) tablet 5 mg  5 mg Oral QHS PRN,MR X 1 Hessie Knows, MD         Discharge Medications: Please see discharge summary for a list of discharge medications.  Relevant Imaging Results:  Relevant Lab Results:   Additional Information  (SSN: [id number])  Jayel Scaduto,  Veronia Beets, LCSW

## 2016-08-20 NOTE — Anesthesia Procedure Notes (Signed)
Spinal  Patient location during procedure: OR Staffing Anesthesiologist: Channell Quattrone Performed: anesthesiologist  Preanesthetic Checklist Completed: patient identified, site marked, surgical consent, pre-op evaluation, timeout performed, IV checked and risks and benefits discussed Spinal Block Patient position: sitting Prep: Betadine Patient monitoring: heart rate, cardiac monitor, continuous pulse ox and blood pressure Approach: midline Location: L3-4 Injection technique: single-shot Needle Needle type: Pencil-Tip  Needle gauge: 25 G Needle length: 9 cm Assessment Sensory level: T10     

## 2016-08-20 NOTE — Evaluation (Signed)
Physical Therapy Evaluation Patient Details Name: William [name]. MRN: [id number] DOB: [birth year] Today's Date: 08/20/2016   History of Present Illness  Pt is a 59 y/o M s/p R THA direct anterior approach.  Pt's PMH includes anxiety, obesity, anemia.  Clinical Impression  Pt is s/p R THA resulting in the deficits listed below (see PT Problem List). PTA pt was Ind with ADLs, IADLs and was using cane prn due to pain.  He currently requires min guard for bed mobility and transfers.  Unable to attempt ambulating today as pt reports feeling clammy and dizzy with transfer.  He has a full flight of steps to get to his bedroom upstairs.  Given pt's current mobility status and home layout, recommending SNF at d/c. Pt will benefit from skilled PT to increase their independence and safety with mobility to allow discharge to the venue listed below.     Follow Up Recommendations SNF    Equipment Recommendations  Rolling walker with 5" wheels    Recommendations for Other Services OT consult     Precautions / Restrictions Precautions Precautions: Fall Precaution Comments: direct anterior approach, no hip precautions Restrictions Weight Bearing Restrictions: Yes RLE Weight Bearing: Weight bearing as tolerated      Mobility  Bed Mobility Overal bed mobility: Needs Assistance Bed Mobility: Supine to Sit     Supine to sit: Min guard;HOB elevated     General bed mobility comments: Increased time and use of bed rail to pull to sitting.  Cues for sequencing.  Transfers Overall transfer level: Needs assistance Equipment used: Rolling walker (2 wheeled) Transfers: Sit to/from [facility] Sit to Stand: Min guard Stand pivot transfers: Min guard       General transfer comment: Upon first sit>stand from bed pt reports feeling "clamy and dizzy" and pt sat back down on EOB until these symptoms resolved within 1 minute.  On second attempt pt denied these symptoms and cues were  provided for managing RW.  Close min guard for safety as pt demonstrates mild instability.  Ambulation/Gait             General Gait Details: deferred this session due to reports of dizziness/clammy  Stairs            Wheelchair Mobility    Modified Rankin (Stroke Patients Only)       Balance Overall balance assessment: Needs assistance Sitting-balance support: No upper extremity supported;Feet supported Sitting balance-Leahy Scale: Fair Sitting balance - Comments: Pt very restless attempting to find a comfortable position sitting EOB   Standing balance support: Bilateral upper extremity supported;During functional activity Standing balance-Leahy Scale: Poor Standing balance comment: Relies on UE support from RW                             Pertinent Vitals/Pain Pain Assessment: 0-10 Pain Score:  (14/10 upon PT arrival, down to 6/10 at end of session) Pain Location: R hip Pain Descriptors / Indicators: Aching;Grimacing;Guarding;Moaning;Restless Pain Intervention(s): Limited activity within patient's tolerance;Monitored during session;Repositioned;Premedicated before session    [place] expects to be discharged to:: Private residence Living Arrangements: Alone Available Help at Discharge: Family;Friend(s);Available 24 hours/day Type of Home: House Home Access: Stairs to enter Entrance Stairs-Rails: Left;Right;Can reach both Entrance Stairs-Number of Steps: 4 Home Layout: Two level;Bed/bath upstairs Home Equipment: Cane - single point;Shower seat - built in;Grab bars - toilet      Prior Function Level of Independence: Independent  with assistive device(s)         Comments: Pt using cane as needed due to pain.  Otherwise Ind with all ADLs, IADLs, mobility.  Denies any falls over the past 6 months.  Works full-time as Forensic psychologist.     Hand Dominance        Extremity/Trunk Assessment   Upper Extremity  Assessment: Overall WFL for tasks assessed           Lower Extremity Assessment: RLE deficits/detail RLE Deficits / Details: limited ROM and strength as expected s/p R THA       Communication   Communication: No difficulties  Cognition Arousal/Alertness: Awake/alert Behavior During Therapy: Restless Overall Cognitive Status: Within Functional Limits for tasks assessed                      General Comments General comments (skin integrity, edema, etc.): Pt had regained full sensation BLEs before start of session    Exercises Total Joint Exercises Ankle Circles/Pumps: AROM;Both;10 reps;Supine Quad Sets: Strengthening;Both;10 reps;Supine Gluteal Sets: Strengthening;Both;10 reps;Supine Heel Slides: Strengthening;Right;10 reps;Supine Straight Leg Raises: AAROM;Strengthening;Right;10 reps;Supine Long Arc Quad: Strengthening;Right;10 reps;Seated   Assessment/Plan    PT Assessment Patient needs continued PT services  PT Problem List Decreased strength;Decreased range of motion;Decreased activity tolerance;Decreased balance;Decreased mobility;Decreased knowledge of use of DME;Decreased safety awareness;Decreased knowledge of precautions;Pain          PT Treatment Interventions DME instruction;Gait training;Stair training;Functional mobility training;Therapeutic activities;Therapeutic exercise;Balance training;Neuromuscular re-education;Patient/family education;Modalities    PT Goals (Current goals can be found in the Care Plan section)  Acute Rehab PT Goals Patient Stated Goal: decreased pain PT Goal Formulation: With patient Time For Goal Achievement: 08/27/16 Potential to Achieve Goals: Good    Frequency BID   Barriers to discharge Inaccessible home environment Full flight of steps to get to bedroom on second floor of home    Co-evaluation               End of Session Equipment Utilized During Treatment: Gait belt Activity Tolerance: Treatment limited  secondary to medical complications (Comment) (reports of dizziness) Patient left: in chair;with call bell/phone within reach;with chair alarm set;with nursing/sitter in room;with family/visitor present Nurse Communication: Mobility status;Weight bearing status         Time: 1437-1510 PT Time Calculation (min) (ACUTE ONLY): 33 min   Charges:   PT Evaluation $PT Eval Low Complexity: 1 Procedure PT Treatments $Therapeutic Exercise: 8-22 mins   PT G Codes:        Collie Siad PT, DPT 08/20/2016, 3:46 PM

## 2016-08-21 LAB — CBC
HCT: 38.2 % — ABNORMAL LOW (ref 40.0–52.0)
Hemoglobin: 13.1 g/dL (ref 13.0–18.0)
MCH: 29.7 pg (ref 26.0–34.0)
MCHC: 34.3 g/dL (ref 32.0–36.0)
MCV: 86.4 fL (ref 80.0–100.0)
PLATELETS: 192 10*3/uL (ref 150–440)
RBC: 4.43 MIL/uL (ref 4.40–5.90)
RDW: 15.2 % — AB (ref 11.5–14.5)
WBC: 9.3 10*3/uL (ref 3.8–10.6)

## 2016-08-21 LAB — BASIC METABOLIC PANEL
Anion gap: 4 — ABNORMAL LOW (ref 5–15)
BUN: 11 mg/dL (ref 6–20)
CHLORIDE: 105 mmol/L (ref 101–111)
CO2: 27 mmol/L (ref 22–32)
CREATININE: 0.6 mg/dL — AB (ref 0.61–1.24)
Calcium: 8.3 mg/dL — ABNORMAL LOW (ref 8.9–10.3)
GFR calc Af Amer: >60 mL/min (ref >60)
GFR calc non Af Amer: >60 mL/min (ref >60)
Glucose, Bld: 133 mg/dL — ABNORMAL HIGH (ref 65–99)
Potassium: 4.3 mmol/L (ref 3.5–5.1)
SODIUM: 136 mmol/L (ref 135–145)

## 2016-08-21 LAB — GLUCOSE, CAPILLARY
GLUCOSE-CAPILLARY: 127 mg/dL — AB (ref 65–99)
GLUCOSE-CAPILLARY: 109 mg/dL — AB (ref 65–99)
Glucose-Capillary: 146 mg/dL — ABNORMAL HIGH (ref 65–99)
Glucose-Capillary: 138 mg/dL — ABNORMAL HIGH (ref 65–99)
Glucose-Capillary: 191 mg/dL — ABNORMAL HIGH (ref 65–99)

## 2016-08-21 NOTE — Anesthesia Postprocedure Evaluation (Signed)
Anesthesia Post Note  Patient: William [name].  Procedure(s) Performed: Procedure(s) (LRB): TOTAL HIP ARTHROPLASTY ANTERIOR APPROACH (Right)  Patient location during evaluation: Nursing Unit Anesthesia Type: Spinal Level of consciousness: awake, oriented and awake and alert Pain management: pain level controlled Vital Signs Assessment: post-procedure vital signs reviewed and stable Respiratory status: spontaneous breathing, nonlabored ventilation and respiratory function stable Cardiovascular status: blood pressure returned to baseline and stable Postop Assessment: no headache and no backache Anesthetic complications: no    Last Vitals:  Vitals:   08/21/16 0056 08/21/16 0506  BP: 119/73 134/79  Pulse: 87 88  Resp: 18 16  Temp: 37.1 C 36.8 C    Last Pain:  Vitals:   08/21/16 0624  TempSrc:   PainSc: 8                  [employer]

## 2016-08-21 NOTE — Progress Notes (Signed)
Physical Therapy Treatment Patient Details Name: William [name]. MRN: [id number] DOB: Dec 15, 1955 Today's Date: 08/21/2016    History of Present Illness Pt is a 60 y/o M s/p R THA direct anterior approach.  Pt's PMH includes anxiety, obesity, anemia.    PT Comments    William [name] reports having very painful R ant thigh muscle spasms yesterday afternoon so time was spent on gentle stretching to R quad today.  Pt reported relief following.  He requires frequent cues for safe technique with transfers and ambulation and cues to remain on task as pt is very talkative.  He tolerated all therapeutic exercises well this date.  SNF remains most appropriate d/c plan given pt's current mobility status, the fact that he lives alone, and that he has a full flight to get to his upstairs bedroom at home.     Follow Up Recommendations  SNF     Equipment Recommendations  Rolling walker with 5" wheels    Recommendations for Other Services       Precautions / Restrictions Precautions Precautions: Fall Precaution Comments: direct anterior approach, no hip precautions Restrictions Weight Bearing Restrictions: Yes RLE Weight Bearing: Weight bearing as tolerated    Mobility  Bed Mobility Overal bed mobility: Needs Assistance Bed Mobility: Supine to Sit     Supine to sit: Min guard;HOB elevated     General bed mobility comments: Pt moves quickly and requires cues to slow down.  He uses his momentum to elevate trunk to upright.    Transfers Overall transfer level: Needs assistance Equipment used: Rolling walker (2 wheeled) Transfers: Sit to/from Stand Sit to Stand: Min guard         General transfer comment: Pt moves very quickly with 2 legs of RW coming off of floor.  Cues to perform more cautiously and push more through UE on bed rather than UE on RW.  Cues to reach back for armrests to sit.  Pt stood from bed x1 and from commode x2.  Ambulation/Gait Ambulation/Gait assistance: Min  guard Ambulation Distance (Feet): 90 Feet Assistive device: Rolling walker (2 wheeled) Gait Pattern/deviations: Step-to pattern;Decreased stride length;Decreased weight shift to right;Decreased stance time - right;Decreased step length - left;Antalgic Gait velocity: decreased Gait velocity interpretation: Below normal speed for age/gender General Gait Details: Cues for upright posture, forward gaze, and to relax shoulders.  Pt with step to gait pattern and cues provided for correct sequencing using RW.  Pt is very talkative and requires multiple cues to remain focused on task.     Stairs            Wheelchair Mobility    Modified Rankin (Stroke Patients Only)       Balance Overall balance assessment: Needs assistance Sitting-balance support: No upper extremity supported;Feet supported Sitting balance-Leahy Scale: Good     Standing balance support: Bilateral upper extremity supported;During functional activity Standing balance-Leahy Scale: Poor Standing balance comment: Relies on UE support from RW                    Cognition Arousal/Alertness: Awake/alert Behavior During Therapy: Anxious Overall Cognitive Status: Within Functional Limits for tasks assessed                      Exercises Total Joint Exercises Ankle Circles/Pumps: AROM;Both;10 reps;Seated Quad Sets: Strengthening;Both;10 reps;Seated Gluteal Sets: Strengthening;Both;10 reps;Seated Hip ABduction/ADduction: AAROM;Strengthening;Right;10 reps;Seated Straight Leg Raises: AAROM;Strengthening;Right;10 reps;Seated Long Arc Quad: Strengthening;Right;10 reps;Seated Knee Flexion: Right;Other (comment);Standing;10 reps;AROM (for  a gentle stretch) Other Exercises Other Exercises: Encouraged pt to perform Bil knee press and ankle pumps throughout day to prevent musculature from tightening and spasming.     General Comments General comments (skin integrity, edema, etc.): Sister, mother, and brother  in law present during session      Pertinent Vitals/Pain Pain Assessment: 0-10 Pain Score: 7  Pain Location: R hip and ant thigh Pain Descriptors / Indicators: Aching;Grimacing;Guarding;Moaning;Restless Pain Intervention(s): Limited activity within patient's tolerance;Monitored during session;Repositioned    Home Living                      Prior Function            PT Goals (current goals can now be found in the care plan section) Acute Rehab PT Goals Patient Stated Goal: decreased pain, to be able to walk again PT Goal Formulation: With patient Time For Goal Achievement: 08/27/16 Potential to Achieve Goals: Good Progress towards PT goals: Progressing toward goals    Frequency    BID      PT Plan Current plan remains appropriate    Co-evaluation             End of Session Equipment Utilized During Treatment: Gait belt Activity Tolerance: Patient limited by fatigue Patient left: in chair;with call bell/phone within reach;with chair alarm set;with family/visitor present     Time: [id number] PT Time Calculation (min) (ACUTE ONLY): 42 min  Charges:  $Gait Training: 8-22 mins $Therapeutic Exercise: 8-22 mins $Therapeutic Activity: 8-22 mins                    G Codes:      Collie Siad PT, DPT 08/21/2016, 9:34 AM

## 2016-08-21 NOTE — Evaluation (Signed)
Occupational Therapy Evaluation Patient Details Name: William [name]. MRN: [id number] DOB: 1955-10-24 Today's Date: 08/21/2016    History of Present Illness Pt. is a 60 y.o. male who was admitted for a RIght THA. Pt. PMHx includes: Anxiety, and anemia.   Clinical Impression   Pt. Is a 60 y.o. Male who was admitted to [facility] for a Right THA. Pt. Presents with impaired strength, weakness, pain, and decreased functional mobility which hinder his ability to complete ADLs, and IADLs. Pt. could benefit from skilled OT services for ADL training, A/E training, UE there. Ex, functional mobility, and pt. education about home modifications, and DME. Pt. plans to go to his mother's home for recovery.    Follow Up Recommendations  No OT follow up    Equipment Recommendations       Recommendations for Other Services       Precautions / Restrictions Precautions Precautions: Fall;Anterior Hip Precaution Comments: direct anterior approach, no hip precautions Restrictions Weight Bearing Restrictions: Yes RLE Weight Bearing: Weight bearing as tolerated              ADL Overall ADL's : Needs assistance/impaired Eating/Feeding: Set up   Grooming: Set up               Lower Body Dressing: Maximal assistance                 General ADL Comments: Pt. education was provided about A/E use for LE ADLs. Pt. reports having a sockaide at home. Pt. has modified home for independence prior to hospitatlization.     Vision     Perception     Praxis      Pertinent Vitals/Pain Pain Assessment: 0-10 Pain Score: 8  Pain Location: Right Hip Pain Descriptors / Indicators: Aching;Grimacing;Guarding;Moaning;Restless Pain Intervention(s): Limited activity within patient's tolerance;Monitored during session     Hand Dominance Right   Extremity/Trunk Assessment Upper Extremity Assessment Upper Extremity Assessment: Overall WFL for tasks assessed           Communication  Communication Communication: No difficulties   Cognition Arousal/Alertness: Awake/alert Behavior During Therapy: Anxious Overall Cognitive Status: Within Functional Limits for tasks assessed                     General Comments       Exercises   Shoulder Instructions     Home Living Family/patient expects to be discharged to:: Private residence (Pt. plans to go to his mother's home for recovery.) Living Arrangements: Alone Available Help at Discharge: Family;Friend(s);Available 24 hours/day Type of Home: House Home Access: Stairs to enter [employer] of Steps: 4 Entrance Stairs-Rails: Right;Left;Can reach both Home Layout: Two level;Bed/bath upstairs Alternate Level Stairs-Number of Steps: 15 Alternate Level Stairs-Rails: Right Bathroom Shower/Tub: Occupational psychologist: Handicapped height Bathroom Accessibility: Yes   Home Equipment: Cane - single point;Shower seat - built in;Grab bars - toilet;Hand held shower head;Grab bars - tub/shower          Prior Functioning/Environment Level of Independence: Independent with assistive device(s)        Comments: Pt. is independent with ADLs/IADLs, and works full-time as a Mudlogger of an Conservator, museum/gallery.        OT Problem List: Decreased strength;Pain;Decreased activity tolerance;Decreased knowledge of use of DME or AE;Impaired UE functional use;Decreased cognition;Impaired sensation   OT Treatment/Interventions: Self-care/ADL training;Therapeutic exercise;DME and/or AE instruction;Therapeutic activities;Energy conservation;Patient/family education    OT Goals(Current goals can be found in the care  plan section) Acute Rehab OT Goals Patient Stated Goal: To regain independence OT Goal Formulation: With patient Potential to Achieve Goals: Good  OT Frequency: Min 1X/week   Barriers to D/C:            Co-evaluation              End of Session    Activity Tolerance: Patient tolerated  treatment well Patient left: in bed;with call bell/phone within reach;with bed alarm set   Time: 1110-1133 OT Time Calculation (min): 23 min Charges:  OT General Charges $OT Visit: 1 Procedure OT Evaluation $OT Eval Moderate Complexity: 1 Procedure G-Codes:    Harrel Carina, MS, OTR/L 08/21/2016, 11:47 AM

## 2016-08-21 NOTE — Progress Notes (Signed)
Physical Therapy Treatment Patient Details Name: William [name]. MRN: [id number] DOB: Jul 12, 1956 Today's Date: 08/21/2016    History of Present Illness Pt is a 60 y/o M s/p R THA direct anterior approach.  Pt's PMH includes anxiety, obesity, anemia.    PT Comments    Mr. Tellinghuisen is making good progress but continues to require min guard assist while ambulating due to instability.  Cues provided to achieve step through gait pattern this session.  Pt tolerated all interventions well this date.  SNF remains most appropriate plan at d/c. Pt will benefit from continued skilled PT services to increase functional independence and safety.   Follow Up Recommendations  SNF     Equipment Recommendations  Rolling walker with 5" wheels    Recommendations for Other Services       Precautions / Restrictions Precautions Precautions: Fall Precaution Comments: direct anterior approach, no hip precautions Restrictions Weight Bearing Restrictions: Yes RLE Weight Bearing: Weight bearing as tolerated    Mobility  Bed Mobility Overal bed mobility: Needs Assistance Bed Mobility: Supine to Sit     Supine to sit: Min guard;HOB elevated     General bed mobility comments: Increased effort and use of bed rail to pull up to sitting.  Transfers Overall transfer level: Needs assistance Equipment used: Rolling walker (2 wheeled) Transfers: Sit to/from Stand Sit to Stand: Min guard         General transfer comment: Pt with improved technique to stand, pushing from bed with BUEs.  Min guard for safety as pt moves quickly.  Ambulation/Gait Ambulation/Gait assistance: Min guard Ambulation Distance (Feet): 120 Feet Assistive device: Rolling walker (2 wheeled) Gait Pattern/deviations: Step-to pattern;Decreased stride length;Decreased weight shift to right;Decreased stance time - right;Decreased step length - left;Antalgic;Step-through pattern Gait velocity: decreased Gait velocity interpretation:  Below normal speed for age/gender General Gait Details: Cues for upright posture, forward gaze, and to relax shoulders.  Pt with step to gait pattern initially.  Demonstration and cues for step through gait pattern, pt returned demonstration, although relies heavily on BUEs through RW during R stance phase.   Stairs            Wheelchair Mobility    Modified Rankin (Stroke Patients Only)       Balance Overall balance assessment: Needs assistance Sitting-balance support: No upper extremity supported;Feet supported Sitting balance-Leahy Scale: Good     Standing balance support: Bilateral upper extremity supported;During functional activity Standing balance-Leahy Scale: Fair Standing balance comment: Pt able to maintain static stance without UE support but requires RW for dynamic activities                    Cognition Arousal/Alertness: Awake/alert Behavior During Therapy: Anxious Overall Cognitive Status: Within Functional Limits for tasks assessed                      Exercises Total Joint Exercises Ankle Circles/Pumps: AROM;Both;10 reps;Seated Quad Sets: Strengthening;Both;10 reps;Seated Gluteal Sets: Strengthening;Both;Seated;Other (comment) (25 reps) Towel Squeeze: Strengthening;Both;10 reps;Seated Hip ABduction/ADduction: AAROM;Strengthening;Right;10 reps;Seated;Standing (10 seated, 10 standing) Straight Leg Raises: AAROM;Strengthening;Right;10 reps;Seated Long Arc Quad: Strengthening;Right;10 reps;Seated Knee Flexion: Right;Other (comment);Standing;AROM;20 reps (2x10; for a gentle stretch) Standing Hip Extension: Strengthening;Right;10 reps;Standing    General Comments        Pertinent Vitals/Pain Pain Assessment: 0-10 Pain Score: 7  Pain Location: R ant thigh Pain Descriptors / Indicators: Aching;Grimacing;Guarding;Moaning;Restless Pain Intervention(s): Limited activity within patient's tolerance;Monitored during session;Repositioned     Home Living Family/patient  expects to be discharged to:: Private residence (Pt. plans to go to his mother's home for recovery.) Living Arrangements: Alone Available Help at Discharge: Family;Friend(s);Available 24 hours/day Type of Home: House Home Access: Stairs to enter Entrance Stairs-Rails: Right;Left;Can reach both Home Layout: Two level;Bed/bath upstairs Home Equipment: Cane - single point;Shower seat - built in;Grab bars - toilet;Hand held shower head;Grab bars - tub/shower      Prior Function Level of Independence: Independent with assistive device(s)      Comments: Pt. is independent with ADLs/IADLs, and works full-time as a M[employer] of an [employer].   PT Goals (current goals can now be found in the care plan section) Acute Rehab PT Goals Patient Stated Goal: decreased pain, to be able to walk again PT Goal Formulation: With patient Time For Goal Achievement: 08/27/16 Potential to Achieve Goals: Good Progress towards PT goals: Progressing toward goals    Frequency    BID      PT Plan Current plan remains appropriate    Co-evaluation             End of Session Equipment Utilized During Treatment: Gait belt Activity Tolerance: Patient limited by fatigue;Patient limited by pain Patient left: in chair;with call bell/phone within reach;with chair alarm set     Time: 1253-1330 PT Time Calculation (min) (ACUTE ONLY): 37 min  Charges:  $Gait Training: 8-22 mins $Therapeutic Exercise: 8-22 mins                    G Codes:      Collie Siad PT, DPT 08/21/2016, 1:48 PM

## 2016-08-21 NOTE — Progress Notes (Signed)
   Subjective: 1 Day Post-Op Procedure(s) (LRB): TOTAL HIP ARTHROPLASTY ANTERIOR APPROACH (Right) Patient reports pain as moderate to severe Patient is complaining of muscle spasms anterior thigh Denies any CP, SOB, ABD pain. We will continue therapy today.  Plan is to go Home after hospital stay.  Objective: Vital signs in last 24 hours: Temp:  [97.3 F (36.3 C)-98.7 F (37.1 C)] 98.4 F (36.9 C) (12/13 0728) Pulse Rate:  [56-88] 87 (12/13 0728) Resp:  [12-20] 18 (12/13 0728) BP: (113-149)/(66-82) 143/82 (12/13 0728) SpO2:  [96 %-100 %] 100 % (12/13 0728) FiO2 (%):  [21 %] 21 % (12/12 1245) Weight:  [123.4 kg (272 lb)-129.3 kg (285 lb)] 129.3 kg (285 lb) (12/12 1359)  Intake/Output from previous day: 12/12 0701 - 12/13 0700 In: 3198.3 [P.O.:240; I.V.:2958.3] Out: 1400 [Urine:1200; Blood:200] Intake/Output this shift: No intake/output data recorded.   Recent Labs  08/20/16 1400 08/21/16 0517  HGB 13.8 13.1    Recent Labs  08/20/16 1400 08/21/16 0517  WBC 9.4 9.3  RBC 4.68 4.43  HCT 40.2 38.2*  PLT 203 192    Recent Labs  08/20/16 1400 08/21/16 0517  NA  --  136  K  --  4.3  CL  --  105  CO2  --  27  BUN  --  11  CREATININE 0.60* 0.60*  GLUCOSE  --  133*  CALCIUM  --  8.3*   No results for input(s): LABPT, INR in the last 72 hours.  EXAM General - Patient is Alert, Appropriate and Oriented Extremity - Neurovascular intact Sensation intact distally Intact pulses distally Dorsiflexion/Plantar flexion intact No cellulitis present Compartment soft Dressing - dressing C/D/I and wound vac intact Motor Function - intact, moving foot and toes well on exam.   Past Medical History:  Diagnosis Date  . Anxiety   . Diabetes mellitus without complication ([place])   . GERD (gastroesophageal reflux disease)   . Iron deficiency anemia   . Obesity   . Osteoarthritis of hip    right  . Peptic ulcer disease     Assessment/Plan:   1 Day Post-Op  Procedure(s) (LRB): TOTAL HIP ARTHROPLASTY ANTERIOR APPROACH (Right) Active Problems:   Primary localized osteoarthritis of right hip  Estimated body mass index is 39.75 kg/m as calculated from the following:   Height as of this encounter: 5\' 11"  (1.803 m).   Weight as of this encounter: 129.3 kg (285 lb). Advance diet Up with therapy  Needs BM Recheck labs in the am CM to assist with discharge Continue with Muscle relaxer   DVT Prophylaxis - Lovenox, Foot Pumps and TED hose Weight-Bearing as tolerated to right leg   T. Rachelle Hora, PA-C [place] 08/21/2016, 8:19 AM

## 2016-08-22 LAB — CBC
HCT: 37.5 % — ABNORMAL LOW (ref 40.0–52.0)
HEMOGLOBIN: 12.8 g/dL — AB (ref 13.0–18.0)
MCH: 29.7 pg (ref 26.0–34.0)
MCHC: 34.1 g/dL (ref 32.0–36.0)
MCV: 87 fL (ref 80.0–100.0)
PLATELETS: 169 10*3/uL (ref 150–440)
RBC: 4.31 MIL/uL — AB (ref 4.40–5.90)
RDW: 15 % — ABNORMAL HIGH (ref 11.5–14.5)
WBC: 9.4 10*3/uL (ref 3.8–10.6)

## 2016-08-22 LAB — GLUCOSE, CAPILLARY
GLUCOSE-CAPILLARY: 116 mg/dL — AB (ref 65–99)
GLUCOSE-CAPILLARY: 159 mg/dL — AB (ref 65–99)

## 2016-08-22 LAB — SURGICAL PATHOLOGY

## 2016-08-22 LAB — BASIC METABOLIC PANEL
ANION GAP: 3 — AB (ref 5–15)
BUN: 11 mg/dL (ref 6–20)
CHLORIDE: 107 mmol/L (ref 101–111)
CO2: 28 mmol/L (ref 22–32)
Calcium: 8.1 mg/dL — ABNORMAL LOW (ref 8.9–10.3)
Creatinine, Ser: 0.61 mg/dL (ref 0.61–1.24)
GFR calc Af Amer: >60 mL/min (ref >60)
Glucose, Bld: 123 mg/dL — ABNORMAL HIGH (ref 65–99)
POTASSIUM: 3.9 mmol/L (ref 3.5–5.1)
SODIUM: 138 mmol/L (ref 135–145)

## 2016-08-22 MED ORDER — OXYCODONE HCL 5 MG PO TABS: 5.0000 mg | ORAL_TABLET | ORAL | 0 refills | Status: DC | PRN

## 2016-08-22 MED ORDER — ENOXAPARIN SODIUM 40 MG/0.4ML ~~LOC~~ SOLN: 40.0000 mg | SUBCUTANEOUS | 0 refills | Status: DC

## 2016-08-22 MED ORDER — OXYCODONE HCL ER 10 MG PO T12A: 10.0000 mg | EXTENDED_RELEASE_TABLET | Freq: Two times a day (BID) | ORAL | 0 refills | Status: AC

## 2016-08-22 NOTE — Progress Notes (Signed)
Physical Therapy Treatment Patient Details Name: William [name]. MRN: [id number] DOB: 1956-01-07 Today's Date: 08/22/2016    History of Present Illness Pt is a 60 y/o M s/p R THA direct anterior approach.  Pt's PMH includes anxiety, obesity, anemia.    PT Comments    Session focused on gait and bed mobility training with home environment simulation.  Pt able to ambulate around unit this session without seated rest.  Practiced supine to/from sit several times with family and self assist techniques.  He remains unable to move his leg onto the bed without assist.  Sister was able to help and pt was able to do without assist with use of leg lifter.   Pt and family more confident with bed mobility skills after session.   Follow Up Recommendations  Home health PT     Equipment Recommendations  Rolling walker with 5" wheels    Recommendations for Other Services       Precautions / Restrictions Precautions Precautions: Fall Precaution Comments: direct anterior approach, no hip precautions Restrictions Weight Bearing Restrictions: Yes RLE Weight Bearing: Weight bearing as tolerated    Mobility  Bed Mobility Overal bed mobility: Needs Assistance Bed Mobility: Supine to Sit;Sit to Supine     Supine to sit: Min guard Sit to supine: Min guard   General bed mobility comments: Bed mobility training with pt and family.  Pt unable to lift le up without assist at his time.  Family educated on assisting and discussed leg lifter options.  Transfers Overall transfer level: Modified independent Equipment used: Rolling walker (2 wheeled) Transfers: Sit to/from Stand Sit to Stand: Min guard Stand pivot transfers: Min guard       General transfer comment: good safety  Ambulation/Gait Ambulation/Gait assistance: Min guard Ambulation Distance (Feet): 160 Feet Assistive device: Rolling walker (2 wheeled) Gait Pattern/deviations: Step-through pattern Gait velocity: decreased Gait  velocity interpretation: Below normal speed for age/gender General Gait Details: irregular step pattern at times but generally safe with no lob's   Stairs Stairs: Yes   Stair Management: One rail Right Number of Stairs: 8 General stair comments: generally safe with good technique  Wheelchair Mobility    Modified Rankin (Stroke Patients Only)       Balance Overall balance assessment: Needs assistance Sitting-balance support: No upper extremity supported;Feet supported Sitting balance-Leahy Scale: Good     Standing balance support: Bilateral upper extremity supported;During functional activity Standing balance-Leahy Scale: Good                      Cognition Arousal/Alertness: Awake/alert Behavior During Therapy: WFL for tasks assessed/performed Overall Cognitive Status: Within Functional Limits for tasks assessed                      Exercises Other Exercises Other Exercises: standing ROM RLE prior to gait.    General Comments        Pertinent Vitals/Pain Pain Assessment: 0-10 Pain Score: 4  Pain Location: R ant thigh Pain Descriptors / Indicators: Sore Pain Intervention(s): Limited activity within patient's tolerance;Patient requesting pain meds-RN notified    Home Living                      Prior Function            PT Goals (current goals can now be found in the care plan section) Progress towards PT goals: Progressing toward goals    Frequency  BID      PT Plan Discharge plan needs to be updated    Co-evaluation             End of Session Equipment Utilized During Treatment: Gait belt Activity Tolerance: Patient tolerated treatment well Patient left: in bed;with bed alarm set;with call bell/phone within reach;with family/visitor present     Time: 1100-1115 PT Time Calculation (min) (ACUTE ONLY): 15 min  Charges:  $Gait Training: 8-22 mins $Therapeutic Exercise: 8-22 mins                    G Codes:       Chesley Noon 08-Sep-2016, 11:50 AM

## 2016-08-22 NOTE — Discharge Summary (Signed)
Physician Discharge Summary  Patient ID: William [name]. MRN: [id number] DOB/AGE: 1955/10/02 60 y.o.  Admit date: 08/20/2016 Discharge date: 08/22/2016  Admission Diagnoses:  [place] HIP   Discharge Diagnoses: Patient Active Problem List   Diagnosis Date Noted  . Primary localized osteoarthritis of right hip 08/20/2016    Past Medical History:  Diagnosis Date  . Anxiety   . Diabetes mellitus without complication ([place])   . GERD (gastroesophageal reflux disease)   . Iron deficiency anemia   . Obesity   . Osteoarthritis of hip    right  . Peptic ulcer disease      Transfusion: none   Consultants (if any):   Discharged Condition: Improved  Hospital Course: William [name]. is an 60 y.o. male who was admitted 08/20/2016 with a diagnosis of right hip osteoarthritis and went to the operating room on 08/20/2016 and underwent the above named procedures.    Surgeries: Procedure(s): TOTAL HIP ARTHROPLASTY ANTERIOR APPROACH on 08/20/2016 Patient tolerated the surgery well. Taken to PACU where she was stabilized and then transferred to the orthopedic floor.  Started on Lovenox 40 q 24 hrs. Foot pumps applied bilaterally at 80 mm. Heels elevated on bed with rolled towels. No evidence of DVT. Negative [place]. Physical therapy started on day #1 for gait training and transfer. OT started day #1 for ADL and assisted devices.  Patient's foley was d/c on day #1. Patient's IV was d/c on day #2.  On post op day #2 patient was stable and ready for discharge to SNF. Patient unable to go home due to numerous amounts of steps throughout his home.  Implants: Medacta 6 standard AMIS stem, 60 mm Mpact cup DM with liner, M 28 mm head  He was given perioperative antibiotics:  Anti-infectives    Start     Dose/Rate Route Frequency Ordered Stop   08/20/16 1700  ceFAZolin (ANCEF) IVPB 2g/100 mL premix     2 g 200 mL/hr over 30 Minutes Intravenous Every 6 hours 08/20/16 1244 08/21/16 0554    08/20/16 0844  ceFAZolin (ANCEF) 2-4 GM/100ML-% IVPB    Comments:  SCHNIER, JULIE: cabinet override      08/20/16 0844 08/20/16 1109   08/20/16 0000  ceFAZolin (ANCEF) IVPB 2g/100 mL premix     2 g 200 mL/hr over 30 Minutes Intravenous  Once 08/19/16 2315 08/20/16 1124    .  He was given sequential compression devices, early ambulation, and Loveonx for DVT prophylaxis.  He benefited maximally from the hospital stay and there were no complications.     Remove wound vac on 08/29/16 (or sooner if alarm sounds for wound vac to be removed). Dispose of wound vac completely. Apply sterile dressing and change as needed. Keep incision clean and dry. Follow up with [facility] in 2 weeks for staple removal and steri strip application.   Recent vital signs:  Vitals:   08/22/16 0355 08/22/16 0751  BP: 123/72 129/76  Pulse: 79 81  Resp: 18   Temp: 98.4 F (36.9 C) 98.4 F (36.9 C)    Recent laboratory studies:  Lab Results  Component Value Date   HGB 12.8 (L) 08/22/2016   HGB 13.1 08/21/2016   HGB 13.8 08/20/2016   Lab Results  Component Value Date   WBC 9.4 08/22/2016   PLT 169 08/22/2016   Lab Results  Component Value Date   INR 0.94 08/07/2016   Lab Results  Component Value Date   NA 138 08/22/2016  K 3.9 08/22/2016   CL 107 08/22/2016   CO2 28 08/22/2016   BUN 11 08/22/2016   CREATININE 0.61 08/22/2016   GLUCOSE 123 (H) 08/22/2016    Discharge Medications:     Medication List    STOP taking these medications   traMADol 50 MG tablet Commonly known as:  ULTRAM     TAKE these medications   B-12 2500 MCG Tabs Take 2,500 mcg by mouth daily.   Biotin 10000 MCG Tabs Take 10,000 mcg by mouth daily.   doxylamine (Sleep) 25 MG tablet Commonly known as:  UNISOM Take 25 mg by mouth at bedtime as needed for sleep.   enoxaparin 40 MG/0.4ML injection Commonly known as:  LOVENOX Inject 0.4 mLs (40 mg total) into the skin daily.   FLUoxetine 20 MG  capsule Commonly known as:  PROZAC Take 20 mg by mouth every evening.   Iron 28 MG Tabs Take 54 mg by mouth daily.   metFORMIN 500 MG tablet Commonly known as:  GLUCOPHAGE Take 500 mg by mouth 2 (two) times daily with a meal. 2 tablets (1000 mg) in the am, 1 tablet (500 mg) in the pm   multivitamin with minerals Tabs tablet Take 1 tablet by mouth daily.   oxyCODONE 10 mg 12 hr tablet Commonly known as:  OXYCONTIN Take 1 tablet (10 mg total) by mouth every 12 (twelve) hours. X 7 days.   oxyCODONE 5 MG immediate release tablet Commonly known as:  Oxy IR/ROXICODONE Take 1-2 tablets (5-10 mg total) by mouth every 3 (three) hours as needed for breakthrough pain.   pantoprazole 40 MG tablet Commonly known as:  PROTONIX Take 40 mg by mouth daily.   ranitidine 150 MG tablet Commonly known as:  ZANTAC Take 150 mg by mouth every evening.   VIAGRA 100 MG tablet Generic drug:  sildenafil Take 100 mg by mouth as needed for erectile dysfunction.       Diagnostic Studies: Dg Hip Operative Unilat W Or W/o Pelvis Right  Result Date: 08/20/2016 CLINICAL DATA:  60 year old male undergoing right total hip arthroplasty from anterior approach. Initial encounter. EXAM: OPERATIVE RIGHT HIP (WITH PELVIS IF PERFORMED) 1 VIEWS TECHNIQUE: Fluoroscopic spot image(s) were submitted for interpretation post-operatively. COMPARISON:  None. FLUOROSCOPY TIME:  0 minutes 42 seconds FINDINGS: AP coned down intraoperative fluoroscopic view of the right hip at 1306 hours. Right hip arthroplasty hardware in place and appears normally aligned on this single view. The acetabular component appears intact. The visible femoral component is intact, the distal aspect is not included. IMPRESSION: Right hip arthroplasty with no adverse features visible. Electronically Signed   By: Genevie Ann M.D.   On: 08/20/2016 12:38   Dg Hip Unilat W Or W/o Pelvis 2-3 Views Right  Result Date: 08/20/2016 CLINICAL DATA:  Post right hip  replaced EXAM: DG HIP (WITH OR WITHOUT PELVIS) 2-3V RIGHT COMPARISON:  None. FINDINGS: The components of the right total hip replacement are in good position with no complicating features. IMPRESSION: Right total hip replacement.  No complicating features. Electronically Signed   By: Ivar Drape M.D.   On: 08/20/2016 13:26    Disposition: 01-Home or Self Care      Signed: Dorise Hiss [facility] 08/22/2016, 8:13 AM

## 2016-08-22 NOTE — Progress Notes (Signed)
PT is recommending home health. Per PT patient walked around the nurses station and did the stairs. Patient will D/C home today. RN case manager aware of above. Please reconsult if future social work needs arise. CSW signing off.   [employer], LCSW [phone]

## 2016-08-22 NOTE — Progress Notes (Signed)
DISCHARGE NOTE:  Pt given discharge instructions and prescriptions (oxycodone, OxyContin, Lovenox). Pt verbalized understanding. Pt wheeled to car by staff.

## 2016-08-22 NOTE — Discharge Instructions (Signed)
ANTERIOR APPROACH TOTAL HIP REPLACEMENT POSTOPERATIVE DIRECTIONS   Hip Rehabilitation, Guidelines Following Surgery  The results of a hip operation are greatly improved after range of motion and muscle strengthening exercises. Follow all safety measures which are given to protect your hip. If any of these exercises cause increased pain or swelling in your joint, decrease the amount until you are comfortable again. Then slowly increase the exercises. Call your caregiver if you have problems or questions.   HOME CARE INSTRUCTIONS  Remove items at home which could result in a fall. This includes throw rugs or furniture in walking pathways.   ICE to the affected hip every three hours for 30 minutes at a time and then as needed for pain and swelling.  Continue to use ice on the hip for pain and swelling from surgery. You may notice swelling that will progress down to the foot and ankle.  This is normal after surgery.  Elevate the leg when you are not up walking on it.    Continue to use the breathing machine which will help keep your temperature down.  It is common for your temperature to cycle up and down following surgery, especially at night when you are not up moving around and exerting yourself.  The breathing machine keeps your lungs expanded and your temperature down.  Do not place pillow under knee, focus on keeping the knee straight while resting  DIET You may resume your previous home diet once your are discharged from the hospital.  DRESSING / WOUND CARE / SHOWERING Keep dressing clean and dry. Change dressing only as needed. You may shower after your first follow up appointment with [facility]. Remove wound vac on 08/29/16 (or sooner if alarm sounds for wound vac to be removed), apply sterile dressing and change as needed. Keep incision clean and dry. Follow up with [place] [facility] in 2 weeks for staple removal and steri strip application.  ACTIVITY Walk with your walker as  instructed. Use walker as long as suggested by your caregivers. Avoid periods of inactivity such as sitting longer than an hour when not asleep. This helps prevent blood clots.  You may resume a sexual relationship in one month or when given the OK by your doctor.  You may return to work once you are cleared by your doctor.  Do not drive a car for 6 weeks or until released by you surgeon.  Do not drive while taking narcotics.  WEIGHT BEARING Weight bearing as tolerated. Use walker/cane as needed for at least 4 weeks post op.  POSTOPERATIVE CONSTIPATION PROTOCOL Constipation - defined medically as fewer than three stools per week and severe constipation as less than one stool per week.  One of the most common issues patients have following surgery is constipation.  Even if you have a regular bowel pattern at home, your normal regimen is likely to be disrupted due to multiple reasons following surgery.  Combination of anesthesia, postoperative narcotics, change in appetite and fluid intake all can affect your bowels.  In order to avoid complications following surgery, here are some recommendations in order to help you during your recovery period.  Colace (docusate) - Pick up an over-the-counter form of Colace or another stool softener and take twice a day as long as you are requiring postoperative pain medications.  Take with a full glass of water daily.  If you experience loose stools or diarrhea, hold the colace until you stool forms back up.  If your symptoms do not  get better within 1 week or if they get worse, check with your doctor.  Dulcolax (bisacodyl) - Pick up over-the-counter and take as directed by the product packaging as needed to assist with the movement of your bowels.  Take with a full glass of water.  Use this product as needed if not relieved by Colace only.   MiraLax (polyethylene glycol) - Pick up over-the-counter to have on hand.  MiraLax is a solution that will increase the  amount of water in your bowels to assist with bowel movements.  Take as directed and can mix with a glass of water, juice, soda, coffee, or tea.  Take if you go more than two days without a movement. Do not use MiraLax more than once per day. Call your doctor if you are still constipated or irregular after using this medication for 7 days in a row.  If you continue to have problems with postoperative constipation, please contact the office for further assistance and recommendations.  If you experience "the worst abdominal pain ever" or develop nausea or vomiting, please contact the office immediatly for further recommendations for treatment.  ITCHING  If you experience itching with your medications, try taking only a single pain pill, or even half a pain pill at a time.  You can also use Benadryl over the counter for itching or also to help with sleep.   TED HOSE STOCKINGS Wear the elastic stockings on both legs for six weeks following surgery during the day but you may remove then at night for sleeping.  MEDICATIONS See your medication summary on the After Visit Summary that the nursing staff will review with you prior to discharge.  You may have some home medications which will be placed on hold until you complete the course of blood thinner medication.  It is important for you to complete the blood thinner medication as prescribed by your surgeon.  Continue your approved medications as instructed at time of discharge.  PRECAUTIONS If you experience chest pain or shortness of breath - call 911 immediately for transfer to the hospital emergency department.  If you develop a fever greater that 101 F, purulent drainage from wound, increased redness or drainage from wound, foul odor from the wound/dressing, or calf pain - CONTACT YOUR SURGEON.                                                   FOLLOW-UP APPOINTMENTS Make sure you keep all of your appointments after your operation with your surgeon  and caregivers. You should call the office at the above phone number and make an appointment for approximately two weeks after the date of your surgery or on the date instructed by your surgeon outlined in the "After Visit Summary".  RANGE OF MOTION AND STRENGTHENING EXERCISES  These exercises are designed to help you keep full movement of your hip joint. Follow your caregiver's or physical therapist's instructions. Perform all exercises about fifteen times, three times per day or as directed. Exercise both hips, even if you have had only one joint replacement. These exercises can be done on a training (exercise) mat, on the floor, on a table or on a bed. Use whatever works the best and is most comfortable for you. Use music or television while you are exercising so that the exercises are a pleasant break  in your day. This will make your life better with the exercises acting as a break in routine you can look forward to.  Lying on your back, slowly slide your foot toward your buttocks, raising your knee up off the floor. Then slowly slide your foot back down until your leg is straight again.  Lying on your back spread your legs as far apart as you can without causing discomfort.  Lying on your side, raise your upper leg and foot straight up from the floor as far as is comfortable. Slowly lower the leg and repeat.  Lying on your back, tighten up the muscle in the front of your thigh (quadriceps muscles). You can do this by keeping your leg straight and trying to raise your heel off the floor. This helps strengthen the largest muscle supporting your knee.  Lying on your back, tighten up the muscles of your buttocks both with the legs straight and with the knee bent at a comfortable angle while keeping your heel on the floor.   IF YOU ARE TRANSFERRED TO A SKILLED REHAB FACILITY If the patient is transferred to a skilled rehab facility following release from the hospital, a list of the current medications  will be sent to the facility for the patient to continue.  When discharged from the skilled rehab facility, please have the facility set up the patient's [place] prior to being released. Also, the skilled facility will be responsible for providing the patient with their medications at time of release from the facility to include their pain medication, the muscle relaxants, and their blood thinner medication. If the patient is still at the rehab facility at time of the two week follow up appointment, the skilled rehab facility will also need to assist the patient in arranging follow up appointment in our office and any transportation needs.  MAKE SURE YOU:  Understand these instructions.  Get help right away if you are not doing well or get worse.    Pick up stool softner and laxative for home use following surgery while on pain medications. Continue to use ice for pain and swelling after surgery. Do not use any lotions or creams on the incision until instructed by your surgeon.

## 2016-08-22 NOTE — Care Management Note (Signed)
Case Management Note  Patient Details  Name: William [name]. MRN: [id number] Date of Birth: [birth year]  Subjective/Objective:  POD # 1 Right THA. Met with patient at bedside. He is sitting up in chair. Patient normally lives alone. He will be discharging home to his mother's house:   - [address] [place], [place] -Patient cell- [phone] -Mothers home #- [phone]   He has a cane but will need a walker. Ordered from [facility]. Offered choice of home health agency. Referral to [facility].  Pharmacy: [place] [phone]. Call Lovenox 40 mg # 14, no refills.  PCP is bert Caryl Comes. Discharging today.    Action/Plan:   Expected Discharge Date:  08/22/16               Expected Discharge Plan:  [place]  In-House Referral:     Discharge planning Services  CM Consult  Post Acute Care Choice:  Durable Medical Equipment, Home Health Choice offered to:  Patient  DME Arranged:    DME Agency:  [place]:  PT [place]:  [facility]  Status of Service:  In process, will continue to follow  If discussed at Long Length of Stay Meetings, dates discussed:    Additional Comments:  Jolly Mango, RN 08/22/2016, 10:49 AM

## 2016-08-22 NOTE — Progress Notes (Signed)
   Subjective: 2 Days Post-Op Procedure(s) (LRB): TOTAL HIP ARTHROPLASTY ANTERIOR APPROACH (Right) Patient reports pain as mild to severe Patient is well, and has had no acute complaints or problems Denies any CP, SOB, ABD pain. We will continue therapy today.  Plan is to go Skilled nursing facility after hospital stay.  Objective: Vital signs in last 24 hours: Temp:  [98.2 F (36.8 C)-99.2 F (37.3 C)] 98.4 F (36.9 C) (12/14 0751) Pulse Rate:  [79-88] 81 (12/14 0751) Resp:  [18] 18 (12/14 0355) BP: (123-140)/(65-80) 129/76 (12/14 0751) SpO2:  [97 %-100 %] 97 % (12/14 0751)  Intake/Output from previous day: 12/13 0701 - 12/14 0700 In: 960 [P.O.:960] Out: -  Intake/Output this shift: No intake/output data recorded.   Recent Labs  08/20/16 1400 08/21/16 0517 08/22/16 0431  HGB 13.8 13.1 12.8*    Recent Labs  08/21/16 0517 08/22/16 0431  WBC 9.3 9.4  RBC 4.43 4.31*  HCT 38.2* 37.5*  PLT 192 169    Recent Labs  08/21/16 0517 08/22/16 0431  NA 136 138  K 4.3 3.9  CL 105 107  CO2 27 28  BUN 11 11  CREATININE 0.60* 0.61  GLUCOSE 133* 123*  CALCIUM 8.3* 8.1*   No results for input(s): LABPT, INR in the last 72 hours.  EXAM General - Patient is Alert, Appropriate and Oriented Extremity - Neurovascular intact Sensation intact distally Intact pulses distally Dorsiflexion/Plantar flexion intact No cellulitis present Compartment soft Dressing - dressing C/D/I and wound vac intact, no drainage Motor Function - intact, moving foot and toes well on exam.   Past Medical History:  Diagnosis Date  . Anxiety   . Diabetes mellitus without complication ([place])   . GERD (gastroesophageal reflux disease)   . Iron deficiency anemia   . Obesity   . Osteoarthritis of hip    right  . Peptic ulcer disease     Assessment/Plan:   2 Days Post-Op Procedure(s) (LRB): TOTAL HIP ARTHROPLASTY ANTERIOR APPROACH (Right) Active Problems:   Primary localized  osteoarthritis of right hip   Acute post op blood loss anemia , Hgb stable  Estimated body mass index is 39.75 kg/m as calculated from the following:   Height as of this encounter: 5\' 11"  (1.803 m).   Weight as of this encounter: 129.3 kg (285 lb). Advance diet Up with therapy  Discharge to SNF today pending BM Follow up with [place] [facility] in 2 weeks.   DVT Prophylaxis - Lovenox, Foot Pumps and TED hose Weight-Bearing as tolerated to right leg   T. Rachelle Hora, PA-C [place] 08/22/2016, 8:18 AM

## 2016-08-26 ENCOUNTER — Emergency Department
Admission: EM | Admit: 2016-08-26 | Discharge: 2016-08-26 | Disposition: A | Discharge status: Home / Self Care | Payer: 59 | Attending: Emergency Medicine | Admitting: Emergency Medicine

## 2016-08-26 ENCOUNTER — Encounter: Payer: Self-pay | Admitting: Emergency Medicine

## 2016-08-26 DIAGNOSIS — Z7984 Long term (current) use of oral hypoglycemic drugs: Secondary | ICD-10-CM | POA: Insufficient documentation

## 2016-08-26 DIAGNOSIS — E119 Type 2 diabetes mellitus without complications: Secondary | ICD-10-CM | POA: Insufficient documentation

## 2016-08-26 DIAGNOSIS — K922 Gastrointestinal hemorrhage, unspecified: Secondary | ICD-10-CM | POA: Diagnosis not present

## 2016-08-26 DIAGNOSIS — K625 Hemorrhage of anus and rectum: Secondary | ICD-10-CM | POA: Diagnosis present

## 2016-08-26 LAB — CBC
HCT: 38.6 % — ABNORMAL LOW (ref 40.0–52.0)
HCT: 41.7 % (ref 40.0–52.0)
HEMOGLOBIN: 13.3 g/dL (ref 13.0–18.0)
Hemoglobin: 14 g/dL (ref 13.0–18.0)
MCH: 29.1 pg (ref 26.0–34.0)
MCH: 28.9 pg (ref 26.0–34.0)
MCHC: 34.3 g/dL (ref 32.0–36.0)
MCHC: 33.7 g/dL (ref 32.0–36.0)
MCV: 85 fL (ref 80.0–100.0)
MCV: 85.8 fL (ref 80.0–100.0)
PLATELETS: 302 10*3/uL (ref 150–440)
Platelets: 295 10*3/uL (ref 150–440)
RBC: 4.55 MIL/uL (ref 4.40–5.90)
RBC: 4.86 MIL/uL (ref 4.40–5.90)
RDW: 14.5 % (ref 11.5–14.5)
RDW: 14.4 % (ref 11.5–14.5)
WBC: 7.5 10*3/uL (ref 3.8–10.6)
WBC: 9.5 10*3/uL (ref 3.8–10.6)

## 2016-08-26 LAB — COMPREHENSIVE METABOLIC PANEL
ALBUMIN: 3.3 g/dL — AB (ref 3.5–5.0)
ALK PHOS: 57 U/L (ref 38–126)
ALT: 16 U/L — ABNORMAL LOW (ref 17–63)
ANION GAP: 7 (ref 5–15)
AST: 20 U/L (ref 15–41)
BILIRUBIN TOTAL: 0.4 mg/dL (ref 0.3–1.2)
BUN: 14 mg/dL (ref 6–20)
CALCIUM: 8.8 mg/dL — AB (ref 8.9–10.3)
CO2: 28 mmol/L (ref 22–32)
Chloride: 106 mmol/L (ref 101–111)
Creatinine, Ser: 0.79 mg/dL (ref 0.61–1.24)
GLUCOSE: 135 mg/dL — AB (ref 65–99)
Potassium: 4.2 mmol/L (ref 3.5–5.1)
Sodium: 141 mmol/L (ref 135–145)
TOTAL PROTEIN: 7 g/dL (ref 6.5–8.1)

## 2016-08-26 LAB — GLUCOSE, CAPILLARY: GLUCOSE-CAPILLARY: 139 mg/dL — AB (ref 65–99)

## 2016-08-26 NOTE — Discharge Instructions (Signed)
As we discussed please return for any further black stools, fast heart rate, shortness of breath, weakness, abdominal pain or any other new or concerning symptoms.

## 2016-08-26 NOTE — ED Notes (Signed)
Pt up to stat desk complaining about wait time.  Pt informed he was next to go back to see md.  Pt unhappy about lengthy wait.  Charge nurse aware Presenter, broadcasting.

## 2016-08-26 NOTE — ED Notes (Signed)
Pt out of room, states, "I am just going to leave, nobody has been back in here, he said he wants to do more blood work but I am just going to leave."  RN to pt bedside at this time to collect repeat blood specimen.

## 2016-08-26 NOTE — ED Triage Notes (Signed)
States had black stool x 1 last night. Patient is on Lovenox past hip surgery 12/12.

## 2016-08-26 NOTE — ED Notes (Signed)
Pt states he needs to go out to the lobby to get something to eat/drink because he is diabetic and has not had anything since this morning.  Pt requesting that Dr. Archie Balboa call him with his CBC results.

## 2016-08-26 NOTE — ED Provider Notes (Signed)
[facility] Emergency Department Provider Note  ____________________________________________   I have reviewed the triage vital signs and the nursing notes.   HISTORY  Chief Complaint Rectal Bleeding   History limited by: Not Limited   HPI William Brutus. is a 60 y.o. male who presents to the emergency department today because of concern for black stool. The patient recently underwent hip surgery and is currently self administering lovenox. He states that since discharge his stool has been normal until last night. He had a large bowel movement that was black. He denies any associated abdominal pain. Since that bowel movement he has had another bowel movement that was brown. He has a history of gastric ulcers and states he is on antiacids for that. He denies any shortness of breath, chest pain.   Past Medical History:  Diagnosis Date  . Anxiety   . Diabetes mellitus without complication ([place])   . GERD (gastroesophageal reflux disease)   . Iron deficiency anemia   . Obesity   . Osteoarthritis of hip    right  . Peptic ulcer disease     Patient Active Problem List   Diagnosis Date Noted  . Primary localized osteoarthritis of right hip 08/20/2016    Past Surgical History:  Procedure Laterality Date  . COLONOSCOPY    . ESOPHAGOGASTRODUODENOSCOPY    . GASTRIC BYPASS     2009  . NASAL SINUS SURGERY    . TOTAL HIP ARTHROPLASTY Right 08/20/2016   Procedure: TOTAL HIP ARTHROPLASTY ANTERIOR APPROACH;  Surgeon: Hessie Knows, MD;  Location: [facility] ORS;  Service: Orthopedics;  Laterality: Right;    Prior to Admission medications   Medication Sig Start Date End Date Taking? Authorizing Provider  Biotin 10000 MCG TABS Take 10,000 mcg by mouth daily.    Historical Provider, MD  Cyanocobalamin (B-12) 2500 MCG TABS Take 2,500 mcg by mouth daily.    Historical Provider, MD  doxylamine, Sleep, (UNISOM) 25 MG tablet Take 25 mg by mouth at bedtime as needed for  sleep.    Historical Provider, MD  enoxaparin (LOVENOX) 40 MG/0.4ML injection Inject 0.4 mLs (40 mg total) into the skin daily. 08/22/16 09/05/16  Duanne Guess, PA-C  Ferrous Sulfate (IRON) 28 MG TABS Take 54 mg by mouth daily.    Historical Provider, MD  FLUoxetine (PROZAC) 20 MG capsule Take 20 mg by mouth every evening.  07/20/16   Historical Provider, MD  metFORMIN (GLUCOPHAGE) 500 MG tablet Take 500 mg by mouth 2 (two) times daily with a meal. 2 tablets (1000 mg) in the am, 1 tablet (500 mg) in the pm    Historical Provider, MD  Multiple Vitamin (MULTIVITAMIN WITH MINERALS) TABS tablet Take 1 tablet by mouth daily.    Historical Provider, MD  oxyCODONE (OXY IR/ROXICODONE) 5 MG immediate release tablet Take 1-2 tablets (5-10 mg total) by mouth every 3 (three) hours as needed for breakthrough pain. 08/22/16   Duanne Guess, PA-C  oxyCODONE (OXYCONTIN) 10 mg 12 hr tablet Take 1 tablet (10 mg total) by mouth every 12 (twelve) hours. X 7 days. 08/22/16 08/29/16  Duanne Guess, PA-C  pantoprazole (PROTONIX) 40 MG tablet Take 40 mg by mouth daily.     Historical Provider, MD  ranitidine (ZANTAC) 150 MG tablet Take 150 mg by mouth every evening.    Historical Provider, MD  VIAGRA 100 MG tablet Take 100 mg by mouth as needed for erectile dysfunction.  07/13/16   Historical Provider, MD  Allergies Morphine and related; Nsaids; and Opium  Family History  Problem Relation Age of Onset  . Dementia Mother   . Diabetes Father     Social History Social History  Substance Use Topics  . Smoking status: Never Smoker  . Smokeless tobacco: Never Used  . Alcohol use No    Review of Systems  Constitutional: Negative for fever. Cardiovascular: Negative for chest pain. Respiratory: Negative for shortness of breath. Gastrointestinal: Negative for abdominal pain. Positive for a black stool last night. Genitourinary: Negative for dysuria. Musculoskeletal: Negative for back pain. Skin: Negative  for rash. Neurological: Negative for headaches, focal weakness or numbness.  10-point ROS otherwise negative.  ____________________________________________   PHYSICAL EXAM:  VITAL SIGNS: ED Triage Vitals [08/26/16 1235]  Enc Vitals Group     BP 122/66     Pulse Rate 81     Resp 18     Temp 99.6 F (37.6 C)     Temp Source Oral     SpO2 99 %     Weight 272 lb (123.4 kg)     Height 5\' 11"  (1.803 m)     Head Circumference      Peak Flow      Pain Score 0    Constitutional: Alert and oriented. Well appearing and in no distress. Eyes: Conjunctivae are normal. Normal extraocular movements. ENT   Head: Normocephalic and atraumatic.   Nose: No congestion/rhinnorhea.   Mouth/Throat: Mucous membranes are moist.   Neck: No stridor. Hematological/Lymphatic/Immunilogical: No cervical lymphadenopathy. Cardiovascular: Normal rate, regular rhythm.  No murmurs, rubs, or gallops.  Respiratory: Normal respiratory effort without tachypnea nor retractions. Breath sounds are clear and equal bilaterally. No wheezes/rales/rhonchi. Gastrointestinal: Soft and non tender. No rebound. No guarding.  GUIAC positive. No melanotic stool on glove.  Genitourinary: Deferred Musculoskeletal: Normal range of motion in all extremities. No lower extremity edema. Neurologic:  Normal speech and language. No gross focal neurologic deficits are appreciated.  Skin:  Skin is warm, dry and intact. No rash noted. Psychiatric: Mood and affect are normal. Speech and behavior are normal. Patient exhibits appropriate insight and judgment.  ____________________________________________    LABS (pertinent positives/negatives)  Labs Reviewed  COMPREHENSIVE METABOLIC PANEL - Abnormal; Notable for the following:       Result Value   Glucose, Bld 135 (*)    Calcium 8.8 (*)    Albumin 3.3 (*)    ALT 16 (*)    All other components within normal limits  CBC - Abnormal; Notable for the following:    HCT 38.6  (*)    All other components within normal limits  GLUCOSE, CAPILLARY - Abnormal; Notable for the following:    Glucose-Capillary 139 (*)    All other components within normal limits  CBC     ____________________________________________   EKG  None  ____________________________________________    RADIOLOGY  None  ____________________________________________   PROCEDURES  Procedures  ____________________________________________   INITIAL IMPRESSION / ASSESSMENT AND PLAN / ED COURSE  Pertinent labs & imaging results that were available during my care of the patient were reviewed by me and considered in my medical decision making (see chart for details).  Patient presents to the emergency department today because of episode of melena last night. Since that time patient has had a brown stool. On exam there was guaiac positive however no melena on the glove. Patient had a repeat CBC done which did not show any worsening of blood level. In fact there was  some improvement. I did discussion with the patient. At this point I do not feel patient is having significant GI bleed. It does sound like it's improving. Patient is on antiacids. Discussed return precautions with the patient for further bleeding, tachycardia or other concerns.  ____________________________________________   FINAL CLINICAL IMPRESSION(S) / ED DIAGNOSES  Final diagnoses:  Gastrointestinal hemorrhage, unspecified gastrointestinal hemorrhage type     Note: This dictation was prepared with Dragon dictation. Any transcriptional errors that result from this process are unintentional     Nance Pear, MD 08/26/16 [id number]

## 2016-09-16 DIAGNOSIS — Z96641 Presence of right artificial hip joint: Secondary | ICD-10-CM | POA: Diagnosis not present

## 2016-09-16 DIAGNOSIS — Z471 Aftercare following joint replacement surgery: Secondary | ICD-10-CM | POA: Diagnosis not present

## 2016-09-16 DIAGNOSIS — E119 Type 2 diabetes mellitus without complications: Secondary | ICD-10-CM | POA: Diagnosis not present

## 2016-09-17 DIAGNOSIS — Z96641 Presence of right artificial hip joint: Secondary | ICD-10-CM | POA: Diagnosis not present

## 2016-09-17 DIAGNOSIS — Z471 Aftercare following joint replacement surgery: Secondary | ICD-10-CM | POA: Diagnosis not present

## 2016-09-17 DIAGNOSIS — E119 Type 2 diabetes mellitus without complications: Secondary | ICD-10-CM | POA: Diagnosis not present

## 2016-09-20 DIAGNOSIS — Z471 Aftercare following joint replacement surgery: Secondary | ICD-10-CM | POA: Diagnosis not present

## 2016-09-20 DIAGNOSIS — Z96641 Presence of right artificial hip joint: Secondary | ICD-10-CM | POA: Diagnosis not present

## 2016-09-20 DIAGNOSIS — E119 Type 2 diabetes mellitus without complications: Secondary | ICD-10-CM | POA: Diagnosis not present

## 2016-10-02 DIAGNOSIS — Z96641 Presence of right artificial hip joint: Secondary | ICD-10-CM | POA: Diagnosis not present

## 2016-10-23 DIAGNOSIS — E782 Mixed hyperlipidemia: Secondary | ICD-10-CM | POA: Diagnosis not present

## 2016-10-23 DIAGNOSIS — R0602 Shortness of breath: Secondary | ICD-10-CM | POA: Diagnosis not present

## 2016-10-23 DIAGNOSIS — I447 Left bundle-branch block, unspecified: Secondary | ICD-10-CM | POA: Diagnosis not present

## 2016-11-22 DIAGNOSIS — D508 Other iron deficiency anemias: Secondary | ICD-10-CM | POA: Diagnosis not present

## 2016-11-22 DIAGNOSIS — Z125 Encounter for screening for malignant neoplasm of prostate: Secondary | ICD-10-CM | POA: Diagnosis not present

## 2016-11-22 DIAGNOSIS — E119 Type 2 diabetes mellitus without complications: Secondary | ICD-10-CM | POA: Diagnosis not present

## 2016-11-29 DIAGNOSIS — D508 Other iron deficiency anemias: Secondary | ICD-10-CM | POA: Diagnosis not present

## 2016-11-29 DIAGNOSIS — M5136 Other intervertebral disc degeneration, lumbar region: Secondary | ICD-10-CM | POA: Diagnosis not present

## 2016-11-29 DIAGNOSIS — E782 Mixed hyperlipidemia: Secondary | ICD-10-CM | POA: Diagnosis not present

## 2016-12-11 DIAGNOSIS — K629 Disease of anus and rectum, unspecified: Secondary | ICD-10-CM | POA: Diagnosis not present

## 2016-12-19 DIAGNOSIS — D049 Carcinoma in situ of skin, unspecified: Secondary | ICD-10-CM | POA: Diagnosis not present

## 2016-12-19 DIAGNOSIS — K629 Disease of anus and rectum, unspecified: Secondary | ICD-10-CM | POA: Diagnosis not present

## 2017-01-08 DIAGNOSIS — D0471 Carcinoma in situ of skin of right lower limb, including hip: Secondary | ICD-10-CM | POA: Diagnosis not present

## 2017-01-08 DIAGNOSIS — C210 Malignant neoplasm of anus, unspecified: Secondary | ICD-10-CM | POA: Diagnosis not present

## 2017-01-08 DIAGNOSIS — D0472 Carcinoma in situ of skin of left lower limb, including hip: Secondary | ICD-10-CM | POA: Diagnosis not present

## 2017-01-30 DIAGNOSIS — C210 Malignant neoplasm of anus, unspecified: Secondary | ICD-10-CM | POA: Diagnosis not present

## 2017-01-30 DIAGNOSIS — L905 Scar conditions and fibrosis of skin: Secondary | ICD-10-CM | POA: Diagnosis not present

## 2017-02-17 DIAGNOSIS — D099 Carcinoma in situ, unspecified: Secondary | ICD-10-CM | POA: Diagnosis not present

## 2017-02-28 DIAGNOSIS — D508 Other iron deficiency anemias: Secondary | ICD-10-CM | POA: Diagnosis not present

## 2017-02-28 DIAGNOSIS — E782 Mixed hyperlipidemia: Secondary | ICD-10-CM | POA: Diagnosis not present

## 2017-02-28 DIAGNOSIS — E119 Type 2 diabetes mellitus without complications: Secondary | ICD-10-CM | POA: Diagnosis not present

## 2017-03-07 DIAGNOSIS — E119 Type 2 diabetes mellitus without complications: Secondary | ICD-10-CM | POA: Diagnosis not present

## 2017-03-07 DIAGNOSIS — K219 Gastro-esophageal reflux disease without esophagitis: Secondary | ICD-10-CM | POA: Diagnosis not present

## 2017-03-07 DIAGNOSIS — I428 Other cardiomyopathies: Secondary | ICD-10-CM | POA: Diagnosis not present

## 2017-04-04 DIAGNOSIS — M65342 Trigger finger, left ring finger: Secondary | ICD-10-CM | POA: Diagnosis not present

## 2017-04-11 DIAGNOSIS — I428 Other cardiomyopathies: Secondary | ICD-10-CM | POA: Diagnosis not present

## 2017-04-11 DIAGNOSIS — E782 Mixed hyperlipidemia: Secondary | ICD-10-CM | POA: Diagnosis not present

## 2017-04-11 DIAGNOSIS — I447 Left bundle-branch block, unspecified: Secondary | ICD-10-CM | POA: Diagnosis not present

## 2017-05-19 DIAGNOSIS — Z86008 Personal history of in-situ neoplasm of other site: Secondary | ICD-10-CM | POA: Diagnosis not present

## 2017-05-30 DIAGNOSIS — H2513 Age-related nuclear cataract, bilateral: Secondary | ICD-10-CM | POA: Diagnosis not present

## 2017-05-30 DIAGNOSIS — E119 Type 2 diabetes mellitus without complications: Secondary | ICD-10-CM | POA: Diagnosis not present

## 2017-05-30 DIAGNOSIS — E089 Diabetes mellitus due to underlying condition without complications: Secondary | ICD-10-CM | POA: Diagnosis not present

## 2017-06-13 DIAGNOSIS — E119 Type 2 diabetes mellitus without complications: Secondary | ICD-10-CM | POA: Diagnosis not present

## 2017-06-13 DIAGNOSIS — D509 Iron deficiency anemia, unspecified: Secondary | ICD-10-CM | POA: Diagnosis not present

## 2017-06-20 DIAGNOSIS — K279 Peptic ulcer, site unspecified, unspecified as acute or chronic, without hemorrhage or perforation: Secondary | ICD-10-CM | POA: Diagnosis not present

## 2017-06-20 DIAGNOSIS — I428 Other cardiomyopathies: Secondary | ICD-10-CM | POA: Diagnosis not present

## 2017-06-20 DIAGNOSIS — E119 Type 2 diabetes mellitus without complications: Secondary | ICD-10-CM | POA: Diagnosis not present

## 2017-10-03 DIAGNOSIS — M65331 Trigger finger, right middle finger: Secondary | ICD-10-CM | POA: Diagnosis not present

## 2017-10-20 DIAGNOSIS — Z8589 Personal history of malignant neoplasm of other organs and systems: Secondary | ICD-10-CM | POA: Diagnosis not present

## 2017-11-07 DIAGNOSIS — D508 Other iron deficiency anemias: Secondary | ICD-10-CM | POA: Diagnosis not present

## 2017-11-07 DIAGNOSIS — E119 Type 2 diabetes mellitus without complications: Secondary | ICD-10-CM | POA: Diagnosis not present

## 2017-11-14 DIAGNOSIS — I428 Other cardiomyopathies: Secondary | ICD-10-CM | POA: Diagnosis not present

## 2017-11-14 DIAGNOSIS — E119 Type 2 diabetes mellitus without complications: Secondary | ICD-10-CM | POA: Diagnosis not present

## 2017-11-14 DIAGNOSIS — K219 Gastro-esophageal reflux disease without esophagitis: Secondary | ICD-10-CM | POA: Diagnosis not present

## 2018-01-16 DIAGNOSIS — M65342 Trigger finger, left ring finger: Secondary | ICD-10-CM | POA: Diagnosis not present

## 2018-02-20 DIAGNOSIS — E782 Mixed hyperlipidemia: Secondary | ICD-10-CM | POA: Diagnosis not present

## 2018-02-20 DIAGNOSIS — D508 Other iron deficiency anemias: Secondary | ICD-10-CM | POA: Diagnosis not present

## 2018-02-20 DIAGNOSIS — I447 Left bundle-branch block, unspecified: Secondary | ICD-10-CM | POA: Diagnosis not present

## 2018-02-20 DIAGNOSIS — R0602 Shortness of breath: Secondary | ICD-10-CM | POA: Diagnosis not present

## 2018-02-20 DIAGNOSIS — E119 Type 2 diabetes mellitus without complications: Secondary | ICD-10-CM | POA: Diagnosis not present

## 2018-02-27 DIAGNOSIS — I428 Other cardiomyopathies: Secondary | ICD-10-CM | POA: Diagnosis not present

## 2018-02-27 DIAGNOSIS — K219 Gastro-esophageal reflux disease without esophagitis: Secondary | ICD-10-CM | POA: Diagnosis not present

## 2018-02-27 DIAGNOSIS — I350 Nonrheumatic aortic (valve) stenosis: Secondary | ICD-10-CM | POA: Diagnosis not present

## 2018-04-24 DIAGNOSIS — Z85828 Personal history of other malignant neoplasm of skin: Secondary | ICD-10-CM | POA: Diagnosis not present

## 2018-05-29 DIAGNOSIS — Z23 Encounter for immunization: Secondary | ICD-10-CM | POA: Diagnosis not present

## 2018-07-24 DIAGNOSIS — E119 Type 2 diabetes mellitus without complications: Secondary | ICD-10-CM | POA: Diagnosis not present

## 2018-07-24 DIAGNOSIS — H524 Presbyopia: Secondary | ICD-10-CM | POA: Diagnosis not present

## 2018-08-10 DIAGNOSIS — M65331 Trigger finger, right middle finger: Secondary | ICD-10-CM | POA: Diagnosis not present

## 2019-11-24 ENCOUNTER — Other Ambulatory Visit: Payer: Self-pay | Admitting: Family Medicine

## 2019-11-24 ENCOUNTER — Inpatient Hospital Stay
Admission: EM | Admit: 2019-11-24 | Discharge: 2019-11-27 | DRG: 292 | Disposition: A | Discharge status: Home / Self Care | Payer: Managed Care, Other (non HMO) | Attending: Internal Medicine | Admitting: Internal Medicine

## 2019-11-24 ENCOUNTER — Inpatient Hospital Stay: Payer: Managed Care, Other (non HMO)

## 2019-11-24 ENCOUNTER — Ambulatory Visit
Admission: RE | Admit: 2019-11-24 | Discharge: 2019-11-24 | Disposition: A | Discharge status: Home / Self Care | Payer: Managed Care, Other (non HMO) | Source: Ambulatory Visit | Attending: Family Medicine | Admitting: Family Medicine

## 2019-11-24 ENCOUNTER — Other Ambulatory Visit: Payer: Self-pay

## 2019-11-24 DIAGNOSIS — R000 Tachycardia, unspecified: Secondary | ICD-10-CM | POA: Diagnosis present

## 2019-11-24 DIAGNOSIS — R0781 Pleurodynia: Secondary | ICD-10-CM

## 2019-11-24 DIAGNOSIS — R0602 Shortness of breath: Secondary | ICD-10-CM

## 2019-11-24 DIAGNOSIS — I700 Atherosclerosis of aorta: Secondary | ICD-10-CM | POA: Diagnosis present

## 2019-11-24 DIAGNOSIS — Z885 Allergy status to narcotic agent status: Secondary | ICD-10-CM | POA: Diagnosis not present

## 2019-11-24 DIAGNOSIS — J90 Pleural effusion, not elsewhere classified: Secondary | ICD-10-CM | POA: Diagnosis present

## 2019-11-24 DIAGNOSIS — I5043 Acute on chronic combined systolic (congestive) and diastolic (congestive) heart failure: Secondary | ICD-10-CM

## 2019-11-24 DIAGNOSIS — Z886 Allergy status to analgesic agent status: Secondary | ICD-10-CM | POA: Diagnosis not present

## 2019-11-24 DIAGNOSIS — I2510 Atherosclerotic heart disease of native coronary artery without angina pectoris: Secondary | ICD-10-CM | POA: Diagnosis present

## 2019-11-24 DIAGNOSIS — I110 Hypertensive heart disease with heart failure: Principal | ICD-10-CM | POA: Diagnosis present

## 2019-11-24 DIAGNOSIS — Z7901 Long term (current) use of anticoagulants: Secondary | ICD-10-CM

## 2019-11-24 DIAGNOSIS — Z6841 Body Mass Index (BMI) 40.0 and over, adult: Secondary | ICD-10-CM

## 2019-11-24 DIAGNOSIS — I160 Hypertensive urgency: Secondary | ICD-10-CM | POA: Diagnosis present

## 2019-11-24 DIAGNOSIS — I214 Non-ST elevation (NSTEMI) myocardial infarction: Secondary | ICD-10-CM | POA: Diagnosis present

## 2019-11-24 DIAGNOSIS — E119 Type 2 diabetes mellitus without complications: Secondary | ICD-10-CM

## 2019-11-24 DIAGNOSIS — K219 Gastro-esophageal reflux disease without esophagitis: Secondary | ICD-10-CM | POA: Diagnosis present

## 2019-11-24 DIAGNOSIS — Z9884 Bariatric surgery status: Secondary | ICD-10-CM | POA: Diagnosis not present

## 2019-11-24 DIAGNOSIS — Z96641 Presence of right artificial hip joint: Secondary | ICD-10-CM | POA: Diagnosis present

## 2019-11-24 DIAGNOSIS — F419 Anxiety disorder, unspecified: Secondary | ICD-10-CM | POA: Diagnosis present

## 2019-11-24 DIAGNOSIS — F32A Depression, unspecified: Secondary | ICD-10-CM | POA: Diagnosis present

## 2019-11-24 DIAGNOSIS — I4892 Unspecified atrial flutter: Secondary | ICD-10-CM | POA: Diagnosis present

## 2019-11-24 DIAGNOSIS — F329 Major depressive disorder, single episode, unspecified: Secondary | ICD-10-CM | POA: Diagnosis present

## 2019-11-24 DIAGNOSIS — I447 Left bundle-branch block, unspecified: Secondary | ICD-10-CM | POA: Diagnosis present

## 2019-11-24 DIAGNOSIS — I5023 Acute on chronic systolic (congestive) heart failure: Secondary | ICD-10-CM | POA: Diagnosis present

## 2019-11-24 DIAGNOSIS — I420 Dilated cardiomyopathy: Secondary | ICD-10-CM | POA: Diagnosis present

## 2019-11-24 DIAGNOSIS — I4891 Unspecified atrial fibrillation: Secondary | ICD-10-CM | POA: Diagnosis present

## 2019-11-24 DIAGNOSIS — I5041 Acute combined systolic (congestive) and diastolic (congestive) heart failure: Secondary | ICD-10-CM

## 2019-11-24 DIAGNOSIS — Z7984 Long term (current) use of oral hypoglycemic drugs: Secondary | ICD-10-CM | POA: Diagnosis not present

## 2019-11-24 DIAGNOSIS — I509 Heart failure, unspecified: Secondary | ICD-10-CM

## 2019-11-24 DIAGNOSIS — N2889 Other specified disorders of kidney and ureter: Secondary | ICD-10-CM | POA: Diagnosis present

## 2019-11-24 DIAGNOSIS — Z79899 Other long term (current) drug therapy: Secondary | ICD-10-CM

## 2019-11-24 HISTORY — DX: Malignant (primary) neoplasm, unspecified: C80.1

## 2019-11-24 LAB — APTT: aPTT: 29 seconds (ref 24–36)

## 2019-11-24 LAB — COMPREHENSIVE METABOLIC PANEL
ALT: 64 U/L — ABNORMAL HIGH (ref 0–44)
AST: 41 U/L (ref 15–41)
Albumin: 3.8 g/dL (ref 3.5–5.0)
Alkaline Phosphatase: 75 U/L (ref 38–126)
Anion gap: 10 (ref 5–15)
BUN: 19 mg/dL (ref 8–23)
CO2: 19 mmol/L — ABNORMAL LOW (ref 22–32)
Calcium: 8.6 mg/dL — ABNORMAL LOW (ref 8.9–10.3)
Chloride: 109 mmol/L (ref 98–111)
Creatinine, Ser: 0.84 mg/dL (ref 0.61–1.24)
GFR calc Af Amer: >60 mL/min (ref >60)
GFR calc non Af Amer: >60 mL/min (ref >60)
Glucose, Bld: 193 mg/dL — ABNORMAL HIGH (ref 70–99)
Potassium: 4.2 mmol/L (ref 3.5–5.1)
Sodium: 138 mmol/L (ref 135–145)
Total Bilirubin: 0.8 mg/dL (ref 0.3–1.2)
Total Protein: 6.8 g/dL (ref 6.5–8.1)

## 2019-11-24 LAB — CBC WITH DIFFERENTIAL/PLATELET
Abs Immature Granulocytes: 0.04 10*3/uL (ref 0.00–0.07)
Basophils Absolute: 0 10*3/uL (ref 0.0–0.1)
Basophils Relative: 0 %
Eosinophils Absolute: 0.1 10*3/uL (ref 0.0–0.5)
Eosinophils Relative: 1 %
HCT: 39.1 % (ref 39.0–52.0)
Hemoglobin: 12.5 g/dL — ABNORMAL LOW (ref 13.0–17.0)
Immature Granulocytes: 0 %
Lymphocytes Relative: 22 %
Lymphs Abs: 2.1 10*3/uL (ref 0.7–4.0)
MCH: 26 pg (ref 26.0–34.0)
MCHC: 32 g/dL (ref 30.0–36.0)
MCV: 81.3 fL (ref 80.0–100.0)
Monocytes Absolute: 0.9 10*3/uL (ref 0.1–1.0)
Monocytes Relative: 10 %
Neutro Abs: 6.3 10*3/uL (ref 1.7–7.7)
Neutrophils Relative %: 67 %
Platelets: 297 10*3/uL (ref 150–400)
RBC: 4.81 MIL/uL (ref 4.22–5.81)
RDW: 16.9 % — ABNORMAL HIGH (ref 11.5–15.5)
WBC: 9.5 10*3/uL (ref 4.0–10.5)
nRBC: 0 % (ref 0.0–0.2)

## 2019-11-24 LAB — BRAIN NATRIURETIC PEPTIDE: B Natriuretic Peptide: 837 pg/mL — ABNORMAL HIGH (ref 0.0–100.0)

## 2019-11-24 LAB — PROTIME-INR
INR: 1.1 (ref 0.8–1.2)
Prothrombin Time: 14.3 seconds (ref 11.4–15.2)

## 2019-11-24 LAB — TROPONIN I (HIGH SENSITIVITY)
Troponin I (High Sensitivity): 100 ng/L (ref <18)
Troponin I (High Sensitivity): 95 ng/L — ABNORMAL HIGH (ref <18)
Troponin I (High Sensitivity): 93 ng/L — ABNORMAL HIGH (ref <18)

## 2019-11-24 LAB — GLUCOSE, CAPILLARY: Glucose-Capillary: 222 mg/dL — ABNORMAL HIGH (ref 70–99)

## 2019-11-24 MED ORDER — SODIUM CHLORIDE 0.9% FLUSH
3.0000 mL | Freq: Two times a day (BID) | INTRAVENOUS | Status: DC
  Administered 2019-11-24 – 2019-11-27 (×5): 3 mL via INTRAVENOUS

## 2019-11-24 MED ORDER — IOHEXOL 350 MG/ML SOLN
75.0000 mL | Freq: Once | INTRAVENOUS | Status: AC | PRN
  Administered 2019-11-24: 75 mL via INTRAVENOUS

## 2019-11-24 MED ORDER — HEPARIN BOLUS VIA INFUSION
4000.0000 [IU] | Freq: Once | INTRAVENOUS | Status: AC
  Administered 2019-11-24: 4000 [IU] via INTRAVENOUS
  Filled 2019-11-24: qty 4000

## 2019-11-24 MED ORDER — ADULT MULTIVITAMIN W/MINERALS CH
1.0000 | ORAL_TABLET | Freq: Every day | ORAL | Status: DC
  Administered 2019-11-24 – 2019-11-27 (×4): 1 via ORAL
  Filled 2019-11-24 (×4): qty 1

## 2019-11-24 MED ORDER — SODIUM CHLORIDE 0.9 % IV SOLN: 250.0000 mL | INTRAVENOUS | Status: DC | PRN

## 2019-11-24 MED ORDER — HEPARIN (PORCINE) 25000 UT/250ML-% IV SOLN
1950.0000 [IU]/h | INTRAVENOUS | Status: DC
  Administered 2019-11-24: 18:00:00 1300 [IU]/h via INTRAVENOUS
  Administered 2019-11-26: 01:00:00 1950 [IU]/h via INTRAVENOUS
  Filled 2019-11-24 (×3): qty 250

## 2019-11-24 MED ORDER — NITROGLYCERIN 2 % TD OINT: 0.5000 [in_us] | TOPICAL_OINTMENT | Freq: Four times a day (QID) | TRANSDERMAL | Status: DC

## 2019-11-24 MED ORDER — VITAMIN B-12 1000 MCG PO TABS
2500.0000 ug | ORAL_TABLET | Freq: Every day | ORAL | Status: DC
  Administered 2019-11-24 – 2019-11-27 (×4): 2500 ug via ORAL
  Filled 2019-11-24: qty 3
  Filled 2019-11-24 (×2): qty 2.5
  Filled 2019-11-24: qty 3

## 2019-11-24 MED ORDER — CARVEDILOL 12.5 MG PO TABS
12.5000 mg | ORAL_TABLET | Freq: Two times a day (BID) | ORAL | Status: DC
  Administered 2019-11-24 – 2019-11-25 (×2): 12.5 mg via ORAL
  Filled 2019-11-24: qty 2
  Filled 2019-11-24: qty 1
  Filled 2019-11-24: qty 2

## 2019-11-24 MED ORDER — PANTOPRAZOLE SODIUM 40 MG PO TBEC
40.0000 mg | DELAYED_RELEASE_TABLET | Freq: Every day | ORAL | Status: DC
  Administered 2019-11-24 – 2019-11-27 (×4): 40 mg via ORAL
  Filled 2019-11-24 (×4): qty 1

## 2019-11-24 MED ORDER — ASPIRIN 81 MG PO CHEW
324.0000 mg | CHEWABLE_TABLET | Freq: Once | ORAL | Status: AC
  Administered 2019-11-24: 17:00:00 324 mg via ORAL
  Filled 2019-11-24: qty 4

## 2019-11-24 MED ORDER — CLOPIDOGREL BISULFATE 75 MG PO TABS: 75.0000 mg | ORAL_TABLET | Freq: Every day | ORAL | Status: DC

## 2019-11-24 MED ORDER — FUROSEMIDE 10 MG/ML IJ SOLN
40.0000 mg | Freq: Two times a day (BID) | INTRAMUSCULAR | Status: DC
  Administered 2019-11-24 – 2019-11-25 (×3): 40 mg via INTRAVENOUS
  Filled 2019-11-24 (×3): qty 4

## 2019-11-24 MED ORDER — SODIUM CHLORIDE 0.9% FLUSH: 3.0000 mL | INTRAVENOUS | Status: DC | PRN

## 2019-11-24 MED ORDER — ACETAMINOPHEN 325 MG PO TABS: 650.0000 mg | ORAL_TABLET | ORAL | Status: DC | PRN

## 2019-11-24 MED ORDER — INSULIN DETEMIR 100 UNIT/ML ~~LOC~~ SOLN
10.0000 [IU] | Freq: Every day | SUBCUTANEOUS | Status: DC
  Administered 2019-11-24 – 2019-11-27 (×4): 10 [IU] via SUBCUTANEOUS
  Filled 2019-11-24 (×5): qty 0.1

## 2019-11-24 MED ORDER — ONDANSETRON HCL 4 MG/2ML IJ SOLN: 4.0000 mg | Freq: Four times a day (QID) | INTRAMUSCULAR | Status: DC | PRN

## 2019-11-24 MED ORDER — FERROUS SULFATE 325 (65 FE) MG PO TABS
325.0000 mg | ORAL_TABLET | Freq: Every day | ORAL | Status: DC
  Administered 2019-11-24 – 2019-11-27 (×4): 325 mg via ORAL
  Filled 2019-11-24 (×4): qty 1

## 2019-11-24 MED ORDER — LISINOPRIL 10 MG PO TABS
10.0000 mg | ORAL_TABLET | Freq: Every day | ORAL | Status: DC
  Filled 2019-11-24: qty 1

## 2019-11-24 MED ORDER — BIOTIN 10000 MCG PO TABS: 10000.0000 ug | ORAL_TABLET | Freq: Every day | ORAL | Status: DC

## 2019-11-24 MED ORDER — FLUOXETINE HCL 20 MG PO CAPS
20.0000 mg | ORAL_CAPSULE | Freq: Every evening | ORAL | Status: DC
  Administered 2019-11-24 – 2019-11-26 (×3): 20 mg via ORAL
  Filled 2019-11-24 (×4): qty 1

## 2019-11-24 NOTE — ED Notes (Signed)
Pt given sandwich try and beverage. No other needs addressed.

## 2019-11-24 NOTE — ED Notes (Signed)
No chest pain.  Sinus tach on monitor.  Pt watching tv.  Waiting on admission

## 2019-11-24 NOTE — ED Notes (Signed)
First Nurse Note  This RN received call from Dr. Caryl Comes; advises admission due to bilateral pleural effusions noted on CT scan today.    Pt alert, NAD noted upon arrival.

## 2019-11-24 NOTE — ED Notes (Signed)
Dr Ubaldo Glassing in with pt now.

## 2019-11-24 NOTE — Consult Note (Addendum)
Cardiology Consultation Note    Patient ID: William [name]., MRN: [id number], DOB/AGE: 02/16/1956 64 y.o. Admit date: 11/24/2019   Date of Consult: 11/24/2019 Primary Physician: Adin Hector, MD Primary Cardiologist: Dr. Ubaldo Glassing  Chief Complaint: tachycardia Reason for Consultation: abnormal troponin Requesting MD: Dr. Francine Graven  HPI: William [name]. is a 64 y.o. male with history of anxiety, diabetes, hypertension, and chronic lbbb who presented to the er with several day history of weakness and fatigue that occurred after getting his [employer] covid 19 shot this past Saturday. In the er he was noted to have what appeard to be sinus tachycardia with his usual left bundle. He has no chest pain. hsTroponin drawn per er protocol was 100 with subsequent vlue of 95. [name] He denies any rest or exertional chest pain. Chest ct revealed no pulmonary emboli, moderate bilateral pleural  Effusions. There was noted to be a 4.6 cm calcified mass off the upper pole of the right kidney. BNP was 837. Pt wwas referred for admission.   Past Medical History:  Diagnosis Date  . Anxiety   . 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 Diabetes mellitus without complication ([place])   . GERD (gastroesophageal reflux disease)   . Iron deficiency anemia   . Obesity   . Osteoarthritis of hip    right  . Peptic ulcer disease       Surgical History:  Past Surgical History:  Procedure Laterality Date  . COLONOSCOPY    . ESOPHAGOGASTRODUODENOSCOPY    . GASTRIC BYPASS     2009  . NASAL SINUS SURGERY    . TOTAL HIP ARTHROPLASTY Right 08/20/2016   Procedure: TOTAL HIP ARTHROPLASTY ANTERIOR APPROACH;  Surgeon: Hessie Knows, MD;  Location: [facility] ORS;  Service: Orthopedics;  Laterality: Right;     Home Meds: Prior to Admission medications   Medication Sig Start Date End Date Taking? Authorizing Provider  Biotin 10000 MCG TABS Take 10,000 mcg by mouth daily.    [provider]  Cyanocobalamin (B-12)  2500 MCG TABS Take 2,500 mcg by mouth daily.    [provider]  doxylamine, Sleep, (UNISOM) 25 MG tablet Take 25 mg by mouth at bedtime as needed for sleep.    [provider]  enoxaparin (LOVENOX) 40 MG/0.4ML injection Inject 0.4 mLs (40 mg total) into the skin daily. 08/22/16 09/05/16  William Guess, PA-C  Ferrous Sulfate (IRON) 28 MG TABS Take 54 mg by mouth daily.    [provider]  FLUoxetine (PROZAC) 20 MG capsule Take 20 mg by mouth every evening.  07/20/16   [provider]  metFORMIN (GLUCOPHAGE) 500 MG tablet Take 500 mg by mouth 2 (two) times daily with a meal. 2 tablets (1000 mg) in the am, 1 tablet (500 mg) in the pm    [provider]  Multiple Vitamin (MULTIVITAMIN WITH MINERALS) TABS tablet Take 1 tablet by mouth daily.    [provider]  oxyCODONE (OXY IR/ROXICODONE) 5 MG immediate release tablet Take 1-2 tablets (5-10 mg total) by mouth every 3 (three) hours as needed for breakthrough pain. 08/22/16   William Guess, PA-C  pantoprazole (PROTONIX) 40 MG tablet Take 40 mg by mouth daily.     [provider]  ranitidine (ZANTAC) 150 MG tablet Take 150 mg by mouth every evening.    [provider]  VIAGRA 100 MG tablet Take 100 mg by mouth as needed  for erectile dysfunction.  07/13/16   [provider]    Inpatient Medications:     Allergies:  Allergies  Allergen Reactions  . Morphine And Related Hives  . Nsaids Other (See Comments)    Gastric bypass  . Opium Hives    Opiate drip when had gastric bypass drug unknown (morphine drip)    Social History   Socioeconomic History  . Marital status: Single    Spouse name: Not on file  . Number of children: Not on file  . Years of education: Not on file  . Highest education level: Not on file  Occupational History  . Not on file  Tobacco Use  . Smoking status: Never Smoker  . Smokeless tobacco: Never Used  Substance and Sexual Activity   . Alcohol use: No  . Drug use: No  . Sexual activity: Not on file  Other Topics Concern  . Not on file  Social History Narrative  . Not on file   Social Determinants of Health   Financial Resource Strain:   . Difficulty of Paying Living Expenses:   Food Insecurity:   . Worried About Charity fundraiser in the Last Year:   . Arboriculturist in the Last Year:   Transportation Needs:   . Film/video editor (Medical):   [name] Lack of Transportation (Non-Medical):   Physical Activity:   . Days of Exercise per Week:   . Minutes of Exercise per Session:   Stress:   . Feeling of Stress :   Social Connections:   . Frequency of Communication with Friends and Family:   . Frequency of Social Gatherings with Friends and Family:   . Attends Religious Services:   . Active Member of Clubs or Organizations:   . Attends Archivist Meetings:   [name] Marital Status:   Intimate Partner Violence:   . Fear of Current or Ex-Partner:   . Emotionally Abused:   [name] Physically Abused:   . Sexually Abused:      Family History  Problem Relation Age of Onset  . Dementia Mother   . Diabetes Father      Review of Systems: A 12-system review of systems was performed and is negative except as noted in the HPI.  Labs: No results for input(s): CKTOTAL, CKMB, TROPONINI in the last 72 hours. Lab Results  Component Value Date   WBC 9.5 11/24/2019   HGB 12.5 (L) 11/24/2019   HCT 39.1 11/24/2019   MCV 81.3 11/24/2019   PLT 297 11/24/2019    Recent Labs  Lab 11/24/19 1536  NA 138  K 4.2  CL 109  CO2 19*  BUN 19  CREATININE 0.84  CALCIUM 8.6*  PROT 6.8  BILITOT 0.8  ALKPHOS 75  ALT 64*  AST 41  GLUCOSE 193*   No results found for: CHOL, HDL, LDLCALC, TRIG No results found for: DDIMER  Radiology/Studies:  CT ANGIO CHEST PE W OR WO CONTRAST  Result Date: 11/24/2019 CLINICAL DATA:  Recent COVID vaccine. Labored breathing. Pleuritic chest pain, shortness of breath. EXAM: CT  ANGIOGRAPHY CHEST WITH CONTRAST TECHNIQUE: Multidetector CT imaging of the chest was performed using the standard protocol during bolus administration of intravenous contrast. Multiplanar CT image reconstructions and MIPs were obtained to evaluate the vascular anatomy. CONTRAST:  11mL OMNIPAQUE IOHEXOL 350 MG/ML SOLN COMPARISON:  None. FINDINGS: Cardiovascular: No filling defects in the pulmonary arteries to suggest pulmonary emboli. Heart is mildly enlarged. Coronary artery and aortic  calcifications. No aneurysm. Mediastinum/Nodes: No mediastinal, hilar, or axillary adenopathy. Trachea and esophagus are unremarkable. Thyroid unremarkable. Lungs/Pleura: Moderate bilateral pleural effusions. Dependent and bibasilar ground-glass atelectasis. No confluent opacities otherwise. Upper Abdomen: Imaging into the upper abdomen shows no acute findings. Rim calcified mass off the upper pole of the right kidney measures 4.6 cm. Musculoskeletal: Chest wall soft tissues are unremarkable. No acute bony abnormality. Review of the MIP images confirms the above findings. IMPRESSION: No evidence of pulmonary embolus. Moderate bilateral pleural effusions. Dependent and bibasilar atelectasis. 4.6 cm rim calcified mass off the upper pole of the right kidney. This could be further evaluated with elective renal ultrasound or abdominal CT/MRI. Coronary artery disease, aortic atherosclerosis. Electronically Signed   By: Rolm Baptise M.D.   On: 11/24/2019 14:13    Wt Readings from Last 3 Encounters:  11/24/19 135.6 kg  08/26/16 123.4 kg  08/20/16 129.3 kg    EKG: sinus tachycardia with lbbb  Physical Exam:  Blood pressure (!) 144/104, pulse (!) 132, temperature 97.6 F (36.4 C), temperature source Oral, resp. rate 18, height 5\' 9"  (1.753 m), weight 135.6 kg, SpO2 98 %. Body mass index is 44.15 kg/m. General: Well developed, well nourished, in no acute distress. Head: Normocephalic, atraumatic, sclera non-icteric, no xanthomas,  nares are without discharge.  Neck: Negative for carotid bruits. JVD not elevated. Lungs: Clear bilaterally to auscultation without wheezes, rales, or rhonchi. Breathing is unlabored. Heart: tachycardic with no murmur Abdomen: Soft, non-tender, non-distended with normoactive bowel sounds. No hepatomegaly. No rebound/guarding. No obvious abdominal masses. Msk:  Strength and tone appear normal for age. Extremities: No clubbing or cyanosis. No edema.  Distal pedal pulses are 2+ and equal bilaterally. Neuro: Alert and oriented X 3. No facial asymmetry. No focal deficit. Moves all extremities spontaneously. Psych:  Responds to questions appropriately with a normal affect.     Assessment and Plan  Patient is a 64 year old male with history of a mild cardiomyopathy, likely nonischemic given negative functional study in the past with chronic left bundle branch block who presented to the ER with complaints of shortness of breath and fatigue which occurred after getting a COVID-19 [employer] vaccine several days ago.  He has felt poorly since this occurred.  He presented to the emergency room with complaints of shortness of breath and fatigue.  He was noted to be in tachycardia probable sinus tachycardia although SVT could be playing a role.  He denied any chest pain but did have some shortness of breath.  Patient had a serum troponin drawn apparently per ER protocol which initially was 100 with subsequent value of 95.  Again had no chest pain.  1.  Elevated troponin-likely demand secondary to the tachycardia versus possible myocarditis.  No clinical evidence of acute coronary syndrome.  This does not appear to be a non-STEMI.  Troponin is only mildly elevated consistent with demand.  Does not appear to require heparinization for this.  We will proceed with an echocardiogram when heart rate improves to evaluate LV function.  Patient has a known ejection fraction 45% from previously.  Would gently hydrate  and agree with carvedilol and lisinopril. Would likely discontinue heparin and plavix unless clinical picture changes.  2.  Pleural effusions-etiology unclear.  Will further evaluate with an echo.  Based on current scenario, patient does not appear to require invasive evaluation from a cardiac standpoint as he has no chest pain.  Signed, Teodoro Spray MD 11/24/2019, 5:15 PM Pager: [phone]

## 2019-11-24 NOTE — Progress Notes (Addendum)
ANTICOAGULATION CONSULT NOTE - Initial Consult  Pharmacy Consult for heparin Indication: chest pain/ACS  Allergies  Allergen Reactions  . Morphine And Related Hives  . Nsaids Other (See Comments)    Gastric bypass  . Opium Hives    Opiate drip when had gastric bypass drug unknown (morphine drip)    Patient Measurements: Height: 5\' 9"  (175.3 cm) Weight: 299 lb (135.6 kg) IBW/kg (Calculated) : 70.7 Heparin Dosing Weight: 102.6 kg  Vital Signs: Temp: 97.6 F (36.4 C) (03/17 1537) Temp Source: Oral (03/17 1537) BP: 144/104 (03/17 1537) Pulse Rate: 132 (03/17 1537)  Labs: Recent Labs    11/24/19 1536 11/24/19 1645  HGB 12.5*  --   HCT 39.1  --   PLT 297  --   LABPROT  --  14.3  INR  --  1.1  CREATININE 0.84  --   TROPONINIHS 100*  --     Estimated Creatinine Clearance: 123.1 mL/min (by C-G formula based on SCr of 0.84 mg/dL).  Assessment: 64 yo male to start on heparin drip for chest pain/ACS. No oral anticoagulants noted on PTA med list.   Goal of Therapy:  Heparin level 0.3-0.7 units/ml Monitor platelets by anticoagulation protocol: Yes   Plan:  Add on aPTT - called lab Heparin bolus 4000 units IV x1 then 1300 units/hr (=13 ml/hr) HL in 6h after start of infusion CBC in AM  Pharmacy will continue to continue.  Rocky Morel 11/24/2019,5:33 PM

## 2019-11-24 NOTE — ED Provider Notes (Signed)
[facility] Emergency Department Provider Note  ____________________________________________  Time seen: Approximately 5:03 PM  I have reviewed the triage vital signs and the nursing notes.   HISTORY  Chief Complaint Chest Pain and Shortness of Breath    HPI William [name]. is a 64 y.o. male with a history of obesity, diabetes, GERD who comes to the ED due to chest pain or shortness of breath.  The patient received the [employer] Covid vaccine 5 days ago.  About 4 days ago he had gradual onset of symptoms with malaise, fatigue, orthopnea and PND, exertional shortness of breath and chest pain that are better with rest.  Pain is nonradiating.  Currently he feels okay sitting upright in the treatment bed.  Denies leg swelling.   Went to primary care who did a Covid test which was negative.  He had a CT angiogram done today which is negative for PE, no evidence of pericardial effusion, but does show moderate bilateral pleural effusions.   Past Medical History:  Diagnosis Date  . Anxiety   . 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 Diabetes mellitus without complication ([place])   . GERD (gastroesophageal reflux disease)   . Iron deficiency anemia   . Obesity   . Osteoarthritis of hip    right  . Peptic ulcer disease      Patient Active Problem List   Diagnosis Date Noted  . Primary localized osteoarthritis of right hip 08/20/2016     Past Surgical History:  Procedure Laterality Date  . COLONOSCOPY    . ESOPHAGOGASTRODUODENOSCOPY    . GASTRIC BYPASS     2009  . NASAL SINUS SURGERY    . TOTAL HIP ARTHROPLASTY Right 08/20/2016   Procedure: TOTAL HIP ARTHROPLASTY ANTERIOR APPROACH;  Surgeon: Hessie Knows, MD;  Location: [facility] ORS;  Service: Orthopedics;  Laterality: Right;     Prior to Admission medications   Medication Sig Start Date End Date Taking? Authorizing Provider  Biotin 10000 MCG TABS Take 10,000 mcg by mouth daily.    [provider]  Cyanocobalamin (B-12) 2500 MCG TABS Take 2,500 mcg by mouth daily.    [provider]  doxylamine, Sleep, (UNISOM) 25 MG tablet Take 25 mg by mouth at bedtime as needed for sleep.    [provider]  enoxaparin (LOVENOX) 40 MG/0.4ML injection Inject 0.4 mLs (40 mg total) into the skin daily. 08/22/16 09/05/16  Duanne Guess, PA-C  Ferrous Sulfate (IRON) 28 MG TABS Take 54 mg by mouth daily.    [provider]  FLUoxetine (PROZAC) 20 MG capsule Take 20 mg by mouth every evening.  07/20/16   [provider]  metFORMIN (GLUCOPHAGE) 500 MG tablet Take 500 mg by mouth 2 (two) times daily with a meal. 2 tablets (1000 mg) in the am, 1 tablet (500 mg) in the pm    [provider]  Multiple Vitamin (MULTIVITAMIN WITH MINERALS) TABS tablet Take 1 tablet by mouth daily.    [provider]  oxyCODONE (OXY IR/ROXICODONE) 5 MG immediate release tablet Take 1-2 tablets (5-10 mg total) by mouth every 3 (three) hours as needed for breakthrough pain. 08/22/16   Duanne Guess, PA-C  pantoprazole (PROTONIX) 40 MG tablet Take 40 mg by mouth daily.     [provider]  ranitidine (ZANTAC) 150 MG tablet Take 150 mg by mouth every evening.    [provider]  VIAGRA 100 MG tablet  Take 100 mg by mouth as needed for erectile dysfunction.  07/13/16   [provider]     Allergies Morphine and related, Nsaids, and Opium   Family History  Problem Relation Age of Onset  . Dementia Mother   . Diabetes Father     Social History Social History   Tobacco Use  . Smoking status: Never Smoker  . Smokeless tobacco: Never Used  Substance Use Topics  . Alcohol use: No  . Drug use: No    Review of Systems  Constitutional:   No fever or chills.  ENT:   No sore throat. No rhinorrhea. Cardiovascular:   Positive chest pain as above without syncope. Respiratory: Positive shortness of breath as above without  cough. Gastrointestinal:   Negative for abdominal pain, vomiting and diarrhea.  Musculoskeletal:   Negative for focal pain or swelling All other systems reviewed and are negative except as documented above in ROS and HPI.  ____________________________________________   PHYSICAL EXAM:  VITAL SIGNS: ED Triage Vitals  Enc Vitals Group     BP 11/24/19 1537 (!) 144/104     Pulse Rate 11/24/19 1537 (!) 132     Resp 11/24/19 1537 18     Temp 11/24/19 1537 97.6 F (36.4 C)     Temp Source 11/24/19 1537 Oral     SpO2 11/24/19 1537 98 %     Weight 11/24/19 1540 299 lb (135.6 kg)     Height 11/24/19 1540 5\' 9"  (1.753 m)     Head Circumference --      Peak Flow --      Pain Score 11/24/19 1540 6     Pain Loc --      Pain Edu? --      Excl. in [place]? --     Vital signs reviewed, nursing assessments reviewed.   Constitutional:   Alert and oriented. Non-toxic appearance. Eyes:   Conjunctivae are normal. EOMI. PERRL. ENT      Head:   Normocephalic and atraumatic.      Nose:   Wearing a mask.      Mouth/Throat:   Wearing a mask.      Neck:   No meningismus. Full ROM. Hematological/Lymphatic/Immunilogical:   No cervical lymphadenopathy. Cardiovascular:   Tachycardia, heart rate 130, wide-complex on monitor. Symmetric bilateral radial and DP pulses.  No murmurs. Cap refill less than 2 seconds. Respiratory:   Normal respiratory effort without tachypnea/retractions.  Breath sounds diminished bilateral bases. Gastrointestinal:   Soft and nontender. Non distended. There is no CVA tenderness.  No rebound, rigidity, or guarding. Musculoskeletal:   Normal range of motion in all extremities. No joint effusions.  No lower extremity tenderness.  No edema. Neurologic:   Normal speech and language.  Motor grossly intact. No acute focal neurologic deficits are appreciated.  Skin:    Skin is warm, dry and intact. No rash noted.  No petechiae, purpura, or  bullae.  ____________________________________________    LABS (pertinent positives/negatives) (all labs ordered are listed, but only abnormal results are displayed) Labs Reviewed  COMPREHENSIVE METABOLIC PANEL - Abnormal; Notable for the following components:      Result Value   CO2 19 (*)    Glucose, Bld 193 (*)    Calcium 8.6 (*)    ALT 64 (*)    All other components within normal limits  BRAIN NATRIURETIC PEPTIDE - Abnormal; Notable for the following components:   B Natriuretic Peptide 837.0 (*)    All other components  within normal limits  CBC WITH DIFFERENTIAL/PLATELET - Abnormal; Notable for the following components:   Hemoglobin 12.5 (*)    RDW 16.9 (*)    All other components within normal limits  TROPONIN I (HIGH SENSITIVITY) - Abnormal; Notable for the following components:   Troponin I (High Sensitivity) 100 (*)    All other components within normal limits  PROTIME-INR  APTT  TROPONIN I (HIGH SENSITIVITY)   ____________________________________________   EKG  Interpreted by me Sinus tachycardia rate 133.  Right axis, left bundle branch block.  No acute ischemic changes.  Patient was noted to have left bundle branch block on EKG during a cardiology clinic visit in June 2019  ____________________________________________    RADIOLOGY  CT ANGIO CHEST PE W OR WO CONTRAST  Result Date: 11/24/2019 CLINICAL DATA:  Recent COVID vaccine. Labored breathing. Pleuritic chest pain, shortness of breath. EXAM: CT ANGIOGRAPHY CHEST WITH CONTRAST TECHNIQUE: Multidetector CT imaging of the chest was performed using the standard protocol during bolus administration of intravenous contrast. Multiplanar CT image reconstructions and MIPs were obtained to evaluate the vascular anatomy. CONTRAST:  58mL OMNIPAQUE IOHEXOL 350 MG/ML SOLN COMPARISON:  None. FINDINGS: Cardiovascular: No filling defects in the pulmonary arteries to suggest pulmonary emboli. Heart is mildly enlarged. Coronary  artery and aortic calcifications. No aneurysm. Mediastinum/Nodes: No mediastinal, hilar, or axillary adenopathy. Trachea and esophagus are unremarkable. Thyroid unremarkable. Lungs/Pleura: Moderate bilateral pleural effusions. Dependent and bibasilar ground-glass atelectasis. No confluent opacities otherwise. Upper Abdomen: Imaging into the upper abdomen shows no acute findings. Rim calcified mass off the upper pole of the right kidney measures 4.6 cm. Musculoskeletal: Chest wall soft tissues are unremarkable. No acute bony abnormality. Review of the MIP images confirms the above findings. IMPRESSION: No evidence of pulmonary embolus. Moderate bilateral pleural effusions. Dependent and bibasilar atelectasis. 4.6 cm rim calcified mass off the upper pole of the right kidney. This could be further evaluated with elective renal ultrasound or abdominal CT/MRI. Coronary artery disease, aortic atherosclerosis. Electronically Signed   By: Rolm Baptise M.D.   On: 11/24/2019 14:13    ____________________________________________   PROCEDURES .Critical Care Performed by: Carrie Mew, MD Authorized by: Carrie Mew, MD   Critical care provider statement:    Critical care time (minutes):  35   Critical care time was exclusive of:  Separately billable procedures and treating other patients   Critical care was necessary to treat or prevent imminent or life-threatening deterioration of the following conditions:  Cardiac failure   Critical care was time spent personally by me on the following activities:  Development of treatment plan with patient or surrogate, discussions with consultants, evaluation of patient's response to treatment, examination of patient, obtaining history from patient or surrogate, ordering and performing treatments and interventions, ordering and review of laboratory studies, ordering and review of radiographic studies, pulse oximetry, re-evaluation of patient's condition and review  of old charts    ____________________________________________  DIFFERENTIAL DIAGNOSIS   Non-STEMI, acute on chronic systolic heart failure, myocarditis, vaccine adverse event  CLINICAL IMPRESSION / ASSESSMENT AND PLAN / ED COURSE  Medications ordered in the ED: Medications  heparin bolus via infusion 4,000 Units (has no administration in time range)    Followed by  heparin ADULT infusion 100 units/mL (25000 units/220mL sodium chloride 0.45%) (has no administration in time range)  aspirin chewable tablet 324 mg (324 mg Oral Given 11/24/19 1709)    Pertinent labs & imaging results that were available during my care of the patient were  reviewed by me and considered in my medical decision making (see chart for details).  [name] was evaluated in Emergency Department on 11/24/2019 for the symptoms described in the history of present illness. He was evaluated in the context of the global COVID-19 pandemic, which necessitated consideration that the patient might be at risk for infection with the SARS-CoV-2 virus that causes COVID-19. Institutional protocols and algorithms that pertain to the evaluation of patients at risk for COVID-19 are in a state of rapid change based on information released by regulatory bodies including the CDC and federal and state organizations. These policies and algorithms were followed during the patient's care in the ED.   Patient presents with exertional chest pain and shortness of breath as well as CHF symptoms.  CT scan from earlier today reviewed, negative for PE or pericardial effusion but does show bilateral pleural effusion.  Suspect patient has developed either NSTEMI or myocarditis which is precipitating acute CHF.  Will start heparin and plan to admit.  EKG is not consistent with ventricular tachycardia, blood pressure is normal, patient is nontoxic.  Electronic medical record reviewed, echo and nuclear medicine myocardial scan from 2017 showed EF of  45% at that time.  Clinical Course as of Nov 24 1734  Wed Nov 24, 2019  1701 Presentation d/w cardiology Dr. Ubaldo Glassing who agrees with current plan of heparin and admission, no additional recommendations at this time.   [PS]    Clinical Course User Index [PS] Carrie Mew, MD    ----------------------------------------- 5:36 PM on 11/24/2019 -----------------------------------------  Discussed with Dr. Ubaldo Glassing again after his bedside evaluation of the patient.  Agrees EKG is sinus.  No additional recs at this time.   ____________________________________________   FINAL CLINICAL IMPRESSION(S) / ED DIAGNOSES    Final diagnoses:  Acute on chronic systolic CHF (congestive heart failure) (HCC)  NSTEMI (non-ST elevated myocardial infarction) [facility])     ED Discharge Orders    None      Portions of this note were generated with dragon dictation software. Dictation errors may occur despite best attempts at proofreading.   Carrie Mew, MD 11/24/19 1736

## 2019-11-24 NOTE — H&P (Addendum)
History and Physical    [name] [id number] DOB: 1955-09-11 DOA: 11/24/2019  PCP: Adin Hector, MD   Patient coming from: Home  I have personally briefly reviewed patient's old medical records in [place]  Chief Complaint: Shortness of breath  HPI: William [name]. is a 64 y.o. male with medical history significant for morbid obesity, GERD, Diabetes mellitus and hypertension who presents to the ER for evaluation of shortness of breath mostly with exertion associated with 2 pillow orthopnea, PND, malaise and fatigue.  Patient states that he received his [place] and [place]le[place] COVID Vaccine about 5 days ago and his symptoms started 2 days after he received the vaccine.  He also complains of exertional chest pain but denies having any nausea, vomiting, diaphoresis or palpitation.  Denies having abdominal pain, no fever, chills, urinary frequency, nocturia or dysuria.  No headache or mental status changes.  He went to his primary care provider had a Covid test which was negative.  He had a CT angiogram done which was negative for PE but showed moderate bilateral pleural effusions and a calcified mass involving the right kidney. His labs revealed an elevated BNP and troponin was elevated at 100. 12 Lead EKG shows a wide complex tachycardia.  ED Course: Patient presents with exertional chest pain and shortness of breath as well as CHF symptoms.  CT scan from earlier today reviewed, negative for PE or pericardial effusion but does show bilateral pleural effusion.  Suspect patient has developed either NSTEMI or myocarditis which is precipitating acute CHF.  Will start heparin and plan to admit.  EKG is not consistent with ventricular tachycardia, blood pressure is normal, patient is nontoxic.  Electronic medical record reviewed, echo and nuclear medicine myocardial scan from 2017 showed EF of 45% at that time.  Review of Systems: As per HPI otherwise 10 point review of systems  negative.    Past Medical History:  Diagnosis Date  . Anxiety   . 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 Diabetes mellitus without complication ([place])   . GERD (gastroesophageal reflux disease)   . Iron deficiency anemia   . Obesity   . Osteoarthritis of hip    right  . Peptic ulcer disease     Past Surgical History:  Procedure Laterality Date  . COLONOSCOPY    . ESOPHAGOGASTRODUODENOSCOPY    . GASTRIC BYPASS     2009  . NASAL SINUS SURGERY    . TOTAL HIP ARTHROPLASTY Right 08/20/2016   Procedure: TOTAL HIP ARTHROPLASTY ANTERIOR APPROACH;  Surgeon: Hessie Knows, MD;  Location: [facility] ORS;  Service: Orthopedics;  Laterality: Right;     reports that he has never smoked. He has never used smokeless tobacco. He reports that he does not drink alcohol or use drugs.  Allergies  Allergen Reactions  . Morphine And Related Hives  . Nsaids Other (See Comments)    Gastric bypass  . Opium Hives    Opiate drip when had gastric bypass drug unknown (morphine drip)    Family History  Problem Relation Age of Onset  . Dementia Mother   . Diabetes Father      Prior to Admission medications   Medication Sig Start Date End Date Taking? Authorizing Provider  Biotin 10000 MCG TABS Take 10,000 mcg by mouth daily.    [provider]  Cyanocobalamin (B-12) 2500 MCG TABS Take 2,500 mcg by mouth daily.    [provider]  doxylamine, Sleep, (UNISOM) 25 MG tablet Take 25 mg by mouth at bedtime as needed for sleep.    [provider]  enoxaparin (LOVENOX) 40 MG/0.4ML injection Inject 0.4 mLs (40 mg total) into the skin daily. 08/22/16 09/05/16  Duanne Guess, PA-C  Ferrous Sulfate (IRON) 28 MG TABS Take 54 mg by mouth daily.    [provider]  FLUoxetine (PROZAC) 20 MG capsule Take 20 mg by mouth every evening.  07/20/16   [provider]  metFORMIN (GLUCOPHAGE) 500 MG tablet Take 500 mg by mouth 2 (two) times daily with a meal. 2 tablets (1000  mg) in the am, 1 tablet (500 mg) in the pm    [provider]  Multiple Vitamin (MULTIVITAMIN WITH MINERALS) TABS tablet Take 1 tablet by mouth daily.    [provider]  oxyCODONE (OXY IR/ROXICODONE) 5 MG immediate release tablet Take 1-2 tablets (5-10 mg total) by mouth every 3 (three) hours as needed for breakthrough pain. 08/22/16   Duanne Guess, PA-C  pantoprazole (PROTONIX) 40 MG tablet Take 40 mg by mouth daily.     [provider]  ranitidine (ZANTAC) 150 MG tablet Take 150 mg by mouth every evening.    [provider]  VIAGRA 100 MG tablet Take 100 mg by mouth as needed for erectile dysfunction.  07/13/16   [provider]    Physical Exam: Vitals:   11/24/19 1537 11/24/19 1540  BP: (!) 144/104   Pulse: (!) 132   Resp: 18   Temp: 97.6 F (36.4 C)   TempSrc: Oral   SpO2: 98%   Weight:  135.6 kg  Height:  5\' 9"  (1.753 m)     Vitals:   11/24/19 1537 11/24/19 1540  BP: (!) 144/104   Pulse: (!) 132   Resp: 18   Temp: 97.6 F (36.4 C)   TempSrc: Oral   SpO2: 98%   Weight:  135.6 kg  Height:  5\' 9"  (1.753 m)    Constitutional: NAD, alert and oriented x 3, Morbidly obese Eyes: PERRL, lids and conjunctivae normal ENMT: Mucous membranes are moist.  Neck: normal, supple, no masses, no thyromegaly Respiratory: Air entry in all lung fields, rales at the bases, no wheezing, no crackles. Normal respiratory effort. No accessory muscle use.  Cardiovascular: tachycardic, no murmurs / rubs / gallops. 2+ pedal edema . 2+ pedal pulses. No carotid bruits.  Abdomen: no tenderness, no masses palpated. No hepatosplenomegaly. Bowel sounds positive.  Musculoskeletal: no clubbing / cyanosis. No joint deformity upper and lower extremities.  Skin: no rashes, lesions, ulcers.  Neurologic: No gross focal neurologic deficit. Psychiatric: Normal mood and affect.   Labs on Admission: I have personally reviewed following labs and imaging  studies  CBC: Recent Labs  Lab 11/24/19 1536  WBC 9.5  NEUTROABS 6.3  HGB 12.5*  HCT 39.1  MCV 81.3  PLT 123XX123   Basic Metabolic Panel: Recent Labs  Lab 11/24/19 1536  NA 138  K 4.2  CL 109  CO2 19*  GLUCOSE 193*  BUN 19  CREATININE 0.84  CALCIUM 8.6*   GFR: Estimated Creatinine Clearance: 123.1 mL/min (by C-G formula based on SCr of 0.84 mg/dL). Liver Function Tests: Recent Labs  Lab 11/24/19 1536  AST 41  ALT 64*  ALKPHOS 75  BILITOT 0.8  PROT 6.8  ALBUMIN 3.8   No results for input(s): LIPASE, AMYLASE in the last 168 hours. No results for input(s): AMMONIA in the last 168 hours.  Coagulation Profile: Recent Labs  Lab 11/24/19 1645  INR 1.1   Cardiac Enzymes: No results for input(s): CKTOTAL, CKMB, CKMBINDEX, TROPONINI in the last 168 hours. BNP (last 3 results) No results for input(s): PROBNP in the last 8760 hours. HbA1C: No results for input(s): HGBA1C in the last 72 hours. CBG: No results for input(s): GLUCAP in the last 168 hours. Lipid Profile: No results for input(s): CHOL, HDL, LDLCALC, TRIG, CHOLHDL, LDLDIRECT in the last 72 hours. Thyroid Function Tests: No results for input(s): TSH, T4TOTAL, FREET4, T3FREE, THYROIDAB in the last 72 hours. Anemia Panel: No results for input(s): VITAMINB12, FOLATE, FERRITIN, TIBC, IRON, RETICCTPCT in the last 72 hours. Urine analysis:    Component Value Date/Time   COLORURINE YELLOW (A) 08/07/2016 1337   APPEARANCEUR CLEAR (A) 08/07/2016 1337   LABSPEC 1.020 08/07/2016 1337   PHURINE 5.0 08/07/2016 1337   GLUCOSEU NEGATIVE 08/07/2016 1337   HGBUR NEGATIVE 08/07/2016 1337   BILIRUBINUR NEGATIVE 08/07/2016 1337   KETONESUR NEGATIVE 08/07/2016 1337   PROTEINUR NEGATIVE 08/07/2016 1337   NITRITE NEGATIVE 08/07/2016 1337   LEUKOCYTESUR NEGATIVE 08/07/2016 1337    Radiological Exams on Admission: CT ANGIO CHEST PE W OR WO CONTRAST  Result Date: 11/24/2019 CLINICAL DATA:  Recent COVID vaccine. Labored  breathing. Pleuritic chest pain, shortness of breath. EXAM: CT ANGIOGRAPHY CHEST WITH CONTRAST TECHNIQUE: Multidetector CT imaging of the chest was performed using the standard protocol during bolus administration of intravenous contrast. Multiplanar CT image reconstructions and MIPs were obtained to evaluate the vascular anatomy. CONTRAST:  33mL OMNIPAQUE IOHEXOL 350 MG/ML SOLN COMPARISON:  None. FINDINGS: Cardiovascular: No filling defects in the pulmonary arteries to suggest pulmonary emboli. Heart is mildly enlarged. Coronary artery and aortic calcifications. No aneurysm. Mediastinum/Nodes: No mediastinal, hilar, or axillary adenopathy. Trachea and esophagus are unremarkable. Thyroid unremarkable. Lungs/Pleura: Moderate bilateral pleural effusions. Dependent and bibasilar ground-glass atelectasis. No confluent opacities otherwise. Upper Abdomen: Imaging into the upper abdomen shows no acute findings. Rim calcified mass off the upper pole of the right kidney measures 4.6 cm. Musculoskeletal: Chest wall soft tissues are unremarkable. No acute bony abnormality. Review of the MIP images confirms the above findings. IMPRESSION: No evidence of pulmonary embolus. Moderate bilateral pleural effusions. Dependent and bibasilar atelectasis. 4.6 cm rim calcified mass off the upper pole of the right kidney. This could be further evaluated with elective renal ultrasound or abdominal CT/MRI. Coronary artery disease, aortic atherosclerosis. Electronically Signed   By: Rolm Baptise M.D.   On: 11/24/2019 14:13    EKG: Independently reviewed.  Wide complex tachycardia  Assessment/Plan Principal Problem:   Acute on chronic combined systolic and diastolic CHF (congestive heart failure) (HCC) Active Problems:   NSTEMI (non-ST elevated myocardial infarction) (HCC)   Type 2 diabetes mellitus without complication (HCC)   GERD (gastroesophageal reflux disease)   Depression    Acute on chronic combined systolic and  diastolic dysfunction CHF Patient presents for evaluation of shortness of breath with exertion associated with 2 pillow orthopnea and PND He has elevated BNP levels and CT scan shows bilateral pleural effusions Patient had a 2D echocardiogram in 2017 that showed an LVEF of 40% Blood pressure was elevated, his diastolic BP Will place patient on Lasix 40 mg IV every 12 Optimize blood pressure control Start patient on carvedilol and lisinopril  Repeat 2D echocardiogram to assess LVEF  Non-ST elevation MI Patient has risk factors for coronary artery disease which include hypertension and diabetes mellitus Presents for new onset CHF and has elevated  troponin levels Will obtain serial troponin Place patient on a heparin drip Consult cardiology Continue Plavix and beta blockers  PRN Nitrates   Diabetes mellitus Place patient on Glucomander N.p.o. for now for possible procedure   Hypertensive urgency Optimize blood pressure control Will place patient on lisinopril, nitrates and Carvedilol   Depression Continue Fluoxetine   Renal mass Noted on CTA of the chest involving the right kidney Obtain renal ultrasound for further evaluation    Morbid Obesity (BMI [id number]) Complicates overall prognosis and care  DVT prophylaxis: Heparin Code Status: Full Family Communication: Plan of care was discussed with patient in detail and he verbalizes understanding and agrees with the plan Disposition Plan: Back to previous home environment Consults called: Cardiology    Marieclaire Bettenhausen MD [facility]     11/24/2019, 6:09 PM

## 2019-11-24 NOTE — ED Notes (Signed)
Pt sent to er from [facility] with sob.  No chest pain.  Sx began 4 days ago after receiving the J&J covid vaccine.  Pt in sinus tach.  No n/v/d.  Pt alert speech clear.  md at bedside.

## 2019-11-24 NOTE — ED Triage Notes (Signed)
Pt comes with CP and Sob since Sunday. Seen and tested for covid. Continues to get worse. Pt seen at [facility] clinic and labs with CT PE done. Shows pulm edema. EKG with LBBB and tachy. New labs done per Dr. Joni Fears.

## 2019-11-25 ENCOUNTER — Inpatient Hospital Stay: Admit: 2019-11-25 | Payer: Managed Care, Other (non HMO)

## 2019-11-25 ENCOUNTER — Inpatient Hospital Stay: Payer: Managed Care, Other (non HMO)

## 2019-11-25 LAB — HEPARIN LEVEL (UNFRACTIONATED)
Heparin Unfractionated: 0.17 IU/mL — ABNORMAL LOW (ref 0.30–0.70)
Heparin Unfractionated: 0.27 IU/mL — ABNORMAL LOW (ref 0.30–0.70)
Heparin Unfractionated: 0.27 IU/mL — ABNORMAL LOW (ref 0.30–0.70)

## 2019-11-25 LAB — BASIC METABOLIC PANEL
Anion gap: 10 (ref 5–15)
BUN: 22 mg/dL (ref 8–23)
CO2: 22 mmol/L (ref 22–32)
Calcium: 8.2 mg/dL — ABNORMAL LOW (ref 8.9–10.3)
Chloride: 106 mmol/L (ref 98–111)
Creatinine, Ser: 0.84 mg/dL (ref 0.61–1.24)
GFR calc Af Amer: >60 mL/min (ref >60)
GFR calc non Af Amer: >60 mL/min (ref >60)
Glucose, Bld: 192 mg/dL — ABNORMAL HIGH (ref 70–99)
Potassium: 3.7 mmol/L (ref 3.5–5.1)
Sodium: 138 mmol/L (ref 135–145)

## 2019-11-25 LAB — CBC
HCT: 36.4 % — ABNORMAL LOW (ref 39.0–52.0)
Hemoglobin: 11.3 g/dL — ABNORMAL LOW (ref 13.0–17.0)
MCH: 25.5 pg — ABNORMAL LOW (ref 26.0–34.0)
MCHC: 31 g/dL (ref 30.0–36.0)
MCV: 82 fL (ref 80.0–100.0)
Platelets: 272 10*3/uL (ref 150–400)
RBC: 4.44 MIL/uL (ref 4.22–5.81)
RDW: 16.6 % — ABNORMAL HIGH (ref 11.5–15.5)
WBC: 8.5 10*3/uL (ref 4.0–10.5)
nRBC: 0.2 % (ref 0.0–0.2)

## 2019-11-25 LAB — HIV ANTIBODY (ROUTINE TESTING W REFLEX): HIV Screen 4th Generation wRfx: NONREACTIVE

## 2019-11-25 LAB — BRAIN NATRIURETIC PEPTIDE: B Natriuretic Peptide: 806 pg/mL — ABNORMAL HIGH (ref 0.0–100.0)

## 2019-11-25 MED ORDER — DILTIAZEM HCL 30 MG PO TABS
30.0000 mg | ORAL_TABLET | Freq: Four times a day (QID) | ORAL | Status: DC
  Administered 2019-11-25: 11:00:00 30 mg via ORAL
  Filled 2019-11-25 (×2): qty 1

## 2019-11-25 MED ORDER — HEPARIN BOLUS VIA INFUSION
1500.0000 [IU] | Freq: Once | INTRAVENOUS | Status: AC
  Administered 2019-11-25: 1500 [IU] via INTRAVENOUS
  Filled 2019-11-25: qty 1500

## 2019-11-25 MED ORDER — DILTIAZEM HCL-DEXTROSE 125-5 MG/125ML-% IV SOLN (PREMIX)
5.0000 mg/h | INTRAVENOUS | Status: DC
  Administered 2019-11-25: 17:00:00 5 mg/h via INTRAVENOUS
  Filled 2019-11-25: qty 125

## 2019-11-25 MED ORDER — DILTIAZEM LOAD VIA INFUSION
10.0000 mg | Freq: Once | INTRAVENOUS | Status: AC
  Administered 2019-11-25: 16:00:00 10 mg via INTRAVENOUS
  Filled 2019-11-25: qty 10

## 2019-11-25 MED ORDER — LORAZEPAM 2 MG/ML IJ SOLN: 1.0000 mg | INTRAMUSCULAR | Status: DC | PRN

## 2019-11-25 MED ORDER — LORAZEPAM 0.5 MG PO TABS
0.5000 mg | ORAL_TABLET | ORAL | Status: DC | PRN
  Administered 2019-11-25 – 2019-11-26 (×3): 0.5 mg via ORAL
  Filled 2019-11-25 (×3): qty 1

## 2019-11-25 MED ORDER — LORAZEPAM 1 MG PO TABS
1.0000 mg | ORAL_TABLET | Freq: Once | ORAL | Status: AC
  Administered 2019-11-25: 1 mg via ORAL
  Filled 2019-11-25: qty 1

## 2019-11-25 MED ORDER — DIGOXIN 0.25 MG/ML IJ SOLN
0.2500 mg | Freq: Once | INTRAMUSCULAR | Status: AC
  Administered 2019-11-25: 18:00:00 0.25 mg via INTRAVENOUS
  Filled 2019-11-25: qty 2

## 2019-11-25 MED ORDER — LISINOPRIL 5 MG PO TABS: 2.5000 mg | ORAL_TABLET | Freq: Every day | ORAL | Status: DC

## 2019-11-25 NOTE — ED Notes (Signed)
Heparin dose change verified by Lorriane Shire, RN.

## 2019-11-25 NOTE — ED Notes (Signed)
Pt given warm wash cloth to clean self- offered pt soap but pt declined

## 2019-11-25 NOTE — TOC Initial Note (Signed)
Transition of Care (TOC) - Initial/Assessment Note    Patient Details  Name: William [name]. MRN: [id number] Date of Birth: December 03, 1955  Transition of Care [facility]) CM/SW Contact:    William Pancoast, RN Phone Number: 11/25/2019, 10:03 AM  Clinical Narrative:                 Spoke with patient at bedside. Patient lives alone, drives and is independent in care. Patient is concerned about eczema spot on his head and would like someone to look at it. RN CM discussed in great detail his heart failure diagnosis and need to monitor sodium, weight daily-report weight gain over 2 lbs to doctor, monitor swelling/fluid intake. Patient states he understands and is very grateful for the information. Patient is open to home health at discharge for nursing and physical therapy if needed. Advised nursing could continue further CHF training. Patient states he is overweight however he remains very active and able to go up and down stairs without difficulty in his home prior to illness.   Expected Discharge Plan: [place]     Patient Goals and CMS Choice Patient states their goals for this hospitalization and ongoing recovery are:: get home and get stronger      Expected Discharge Plan and Services Expected Discharge Plan: [place]       Living arrangements for the past 2 months: Single Family Home                                      Prior Living Arrangements/Services Living arrangements for the past 2 months: Single Family Home Lives with:: Self Patient language and need for interpreter reviewed:: Yes Do you feel safe going back to the place where you live?: Yes      Need for Family Participation in Patient Care: No (Comment) Care giver support system in place?: Yes (comment) Current home services: DME(cane) Criminal Activity/Legal Involvement Pertinent to Current Situation/Hospitalization: No - Comment as needed  Activities of Daily Living       Permission Sought/Granted Permission sought to share information with : Case Manager Permission granted to share information with : Yes, Verbal Permission Granted              Emotional Assessment Appearance:: Appears stated age Attitude/Demeanor/Rapport: Engaged, Self-Confident Affect (typically observed): Accepting, Afraid/Fearful Orientation: : Oriented to Self, Oriented to Place, Oriented to  Time, Oriented to Situation Alcohol / Substance Use: Never Used Psych Involvement: No (comment)  Admission diagnosis:  Acute CHF (congestive heart failure) ([place]) [I50.9] Patient Active Problem List   Diagnosis Date Noted  . Type 2 diabetes mellitus without complication ([place]) XX123456  . GERD (gastroesophageal reflux disease) 11/24/2019  . Acute on chronic combined systolic and diastolic CHF (congestive heart failure) ([place]) 11/24/2019  . Depression 11/24/2019  . Renal mass, right 11/24/2019  . Primary localized osteoarthritis of right hip 08/20/2016   PCP:  William Hector, MD Pharmacy:   [id number] - [place], [place] [address] Phone: [phone] Fax: [phone]     Social Determinants of Health (SDOH) Interventions    Readmission Risk Interventions No flowsheet data found.

## 2019-11-25 NOTE — Progress Notes (Signed)
[place] for heparin Indication: chest pain/ACS  Allergies  Allergen Reactions  . Morphine And Related Hives  . Nsaids Other (See Comments)    Gastric bypass  . Opium Hives    Opiate drip when had gastric bypass drug unknown (morphine drip)    Patient Measurements: Height: 5\' 9"  (175.3 cm) Weight: 299 lb (135.6 kg) IBW/kg (Calculated) : 70.7 Heparin Dosing Weight: 102.6 kg  Vital Signs: Temp: 97.6 F (36.4 C) (03/17 1537) Temp Source: Oral (03/17 1537) BP: 100/85 (03/17 2330) Pulse Rate: 128 (03/17 2315)  Labs: Recent Labs    11/24/19 1536 11/24/19 1645 11/24/19 2103 11/25/19 0032  HGB 12.5*  --   --   --   HCT 39.1  --   --   --   PLT 297  --   --   --   APTT  --  29  --   --   LABPROT  --  14.3  --   --   INR  --  1.1  --   --   HEPARINUNFRC  --   --   --  0.17*  CREATININE 0.84  --   --   --   TROPONINIHS 100* 95* 93*  --     Estimated Creatinine Clearance: 123.1 mL/min (by C-G formula based on SCr of 0.84 mg/dL).  Assessment: 64 yo male to start on heparin drip for chest pain/ACS. No oral anticoagulants noted on PTA med list.   Goal of Therapy:  Heparin level 0.3-0.7 units/ml Monitor platelets by anticoagulation protocol: Yes   Plan:  Add on aPTT - called lab Heparin bolus 4000 units IV x1 then 1300 units/hr (=13 ml/hr) HL in 6h after start of infusion CBC in AM  0318 0032 HL 0.17, subtherapeutic, will increase heparin to 1550 units/hr and recheck HL ~ 6 hours after rate increased.   [place] Crane, Hinda Lindor A 11/25/2019,1:29 AM

## 2019-11-25 NOTE — Progress Notes (Signed)
Patient Name: William [name]. Date of Encounter: 11/25/2019  Hospital Problem List     Principal Problem:   Acute on chronic combined systolic and diastolic CHF (congestive heart failure) (HCC) Active Problems:   Type 2 diabetes mellitus without complication (HCC)   GERD (gastroesophageal reflux disease)   Depression   Renal mass, right    Patient Profile     64 y.o. male with history of anxiety, diabetes, hypertension, and chronic lbbb who presented to the er with several day history of weakness and fatigue that occurred after getting his [employer] covid 19 shot this past Saturday. In the er he was noted to have what appeard to be sinus tachycardia with his usual left bundle. He has no chest pain. hsTroponin drawn per er protocol was 100 with subsequent vlue of 95. [name] He denies any rest or exertional chest pain. Chest ct revealed no pulmonary emboli, moderate bilateral pleural  Effusions. There was noted to be a 4.6 cm calcified mass off the upper pole of the right kidney. BNP was 837.   Subjective   Has diuresed 1.2 liters and feels better. Able to lay flat over night.   Inpatient Medications    . carvedilol  12.5 mg Oral BID WC  . ferrous sulfate  325 mg Oral Daily  . FLUoxetine  20 mg Oral QPM  . furosemide  40 mg Intravenous Q12H  . insulin detemir  10 Units Subcutaneous Daily  . lisinopril  2.5 mg Oral Daily  . multivitamin with minerals  1 tablet Oral Daily  . pantoprazole  40 mg Oral Daily  . sodium chloride flush  3 mL Intravenous Q12H  . vitamin B-12  2,500 mcg Oral Daily    Vital Signs    Vitals:   11/25/19 0730 11/25/19 0731 11/25/19 0737 11/25/19 0800  BP: (!) 108/92 (!) 108/92  102/88  Pulse: (!) 125 (!) 129 (!) 129   Resp: (!) 25  (!) 21 20  Temp:      TempSrc:      SpO2:   96%   Weight:      Height:        Intake/Output Summary (Last 24 hours) at 11/25/2019 0816 Last data filed at 11/25/2019 0803 Gross per 24 hour  Intake 150 ml  Output  1350 ml  Net -1200 ml   Filed Weights   11/24/19 1540  Weight: 135.6 kg    Physical Exam    GEN: Well nourished, well developed, in no acute distress.  HEENT: normal.  Neck: Supple, no JVD, carotid bruits, or masses. Cardiac: tachycardic Respiratory:  Respirations regular and unlabored, clear to auscultation bilaterally. GI: Soft, nontender, nondistended, BS + x 4. MS: no deformity or atrophy. Skin: warm and dry, no rash. Neuro:  Strength and sensation are intact. Psych: Normal affect.  Labs    CBC Recent Labs    11/24/19 1536 11/25/19 0634  WBC 9.5 8.5  NEUTROABS 6.3  --   HGB 12.5* 11.3*  HCT 39.1 36.4*  MCV 81.3 82.0  PLT 297 Q000111Q   Basic Metabolic Panel Recent Labs    11/24/19 1536 11/25/19 0634  NA 138 138  K 4.2 3.7  CL 109 106  CO2 19* 22  GLUCOSE 193* 192*  BUN 19 22  CREATININE 0.84 0.84  CALCIUM 8.6* 8.2*   Liver Function Tests Recent Labs    11/24/19 1536  AST 41  ALT 64*  ALKPHOS 75  BILITOT 0.8  PROT  6.8  ALBUMIN 3.8   No results for input(s): LIPASE, AMYLASE in the last 72 hours. Cardiac Enzymes No results for input(s): CKTOTAL, CKMB, CKMBINDEX, TROPONINI in the last 72 hours. BNP Recent Labs    11/24/19 1536  BNP 837.0*   D-Dimer No results for input(s): DDIMER in the last 72 hours. Hemoglobin A1C No results for input(s): HGBA1C in the last 72 hours. Fasting Lipid Panel No results for input(s): CHOL, HDL, LDLCALC, TRIG, CHOLHDL, LDLDIRECT in the last 72 hours. Thyroid Function Tests No results for input(s): TSH, T4TOTAL, T3FREE, THYROIDAB in the last 72 hours.  Invalid input(s): FREET3  Telemetry    Tachycardia. Svt vs afib  ECG    Tachycardia. Svt with lbbb  Radiology    CT ANGIO CHEST PE W OR WO CONTRAST  Result Date: 11/24/2019 CLINICAL DATA:  Recent COVID vaccine. Labored breathing. Pleuritic chest pain, shortness of breath. EXAM: CT ANGIOGRAPHY CHEST WITH CONTRAST TECHNIQUE: Multidetector CT imaging of the  chest was performed using the standard protocol during bolus administration of intravenous contrast. Multiplanar CT image reconstructions and MIPs were obtained to evaluate the vascular anatomy. CONTRAST:  83mL OMNIPAQUE IOHEXOL 350 MG/ML SOLN COMPARISON:  None. FINDINGS: Cardiovascular: No filling defects in the pulmonary arteries to suggest pulmonary emboli. Heart is mildly enlarged. Coronary artery and aortic calcifications. No aneurysm. Mediastinum/Nodes: No mediastinal, hilar, or axillary adenopathy. Trachea and esophagus are unremarkable. Thyroid unremarkable. Lungs/Pleura: Moderate bilateral pleural effusions. Dependent and bibasilar ground-glass atelectasis. No confluent opacities otherwise. Upper Abdomen: Imaging into the upper abdomen shows no acute findings. Rim calcified mass off the upper pole of the right kidney measures 4.6 cm. Musculoskeletal: Chest wall soft tissues are unremarkable. No acute bony abnormality. Review of the MIP images confirms the above findings. IMPRESSION: No evidence of pulmonary embolus. Moderate bilateral pleural effusions. Dependent and bibasilar atelectasis. 4.6 cm rim calcified mass off the upper pole of the right kidney. This could be further evaluated with elective renal ultrasound or abdominal CT/MRI. Coronary artery disease, aortic atherosclerosis. Electronically Signed   By: Rolm Baptise M.D.   On: 11/24/2019 14:13   US RENAL  Result Date: 11/24/2019 CLINICAL DATA:  Right renal mass on CT EXAM: RENAL / URINARY TRACT ULTRASOUND COMPLETE COMPARISON:  CT 11/24/2019 FINDINGS: Right Kidney: Renal measurements: 12.7 x 7.3 x 6.6 cm = volume: 319 mL. Cortical echogenicity is normal. No hydronephrosis. Cyst in the upper pole measuring 5.1 x 4.1 x 4.4 cm corresponding to the CT abnormality. Additional cyst in the lower pole measuring 3 x 2.6 x 2.6 cm. Left Kidney: Renal measurements: 11.4 x 6.7 x 6.2 cm = volume: 247 mL. Echogenicity within normal limits. No mass or  hydronephrosis visualized. Bladder: Poorly visible.  Bladder is empty Other: None. IMPRESSION: 1. Right renal cysts. 2. The kidneys are otherwise normal. Electronically Signed   By: Donavan Foil M.D.   On: 11/24/2019 18:57    Assessment & Plan    Patient is a 64 year old male with history of a mild cardiomyopathy, likely nonischemic given negative functional study in the past with chronic left bundle branch block who presented to the ER with complaints of shortness of breath and fatigue which occurred after getting a COVID-19 [employer] vaccine several days ago.  He has felt poorly since this occurred.  He presented to the emergency room with complaints of shortness of breath and fatigue.  He was noted to be in tachycardia probable sinus tachycardia although SVT could be playing a role.  He  denied any chest pain but did have some shortness of breath.  Patient had a serum troponin drawn apparently per ER protocol which initially was 100 with subsequent value of 95.    1.  Elevated troponin-likely demand secondary to the tachycardia versus possible myocarditis.  No clinical evidence of acute coronary syndrome.  This does not appear to be a non-STEMI.  Troponin is only mildly elevated consistent with demand and remains flat.  .  We will proceed with an echocardiogram when heart rate improves to evaluate LV function.  Patient has a known ejection fraction 45% from previously.  Would gently hydrate and will discontinue lisinopril due to soft blood pressure and add cardizem 30 q 6 to attempt rate control. This is not a nstemi  2.  Pleural effusions-etiology unclear.  Will further evaluate with an echo.  Based on current scenario, patient does not appear to require invasive evaluation from a cardiac standpoint as he has no chest pain. Continue with diuresis.   3 renal mass-appears to be cysts.   Signed, Javier Docker Calle Schader MD 11/25/2019, 8:16 AM  Pager: [phone]

## 2019-11-25 NOTE — Progress Notes (Signed)
[place] for heparin Indication: chest pain/ACS  Allergies  Allergen Reactions  . Morphine And Related Hives  . Nsaids Other (See Comments)    Gastric bypass  . Opium Hives    Opiate drip when had gastric bypass drug unknown (morphine drip)    Patient Measurements: Height: 5\' 9"  (175.3 cm) Weight: 299 lb (135.6 kg) IBW/kg (Calculated) : 70.7 Heparin Dosing Weight: 102.6 kg  Vital Signs: BP: 102/88 (03/18 0800) Pulse Rate: 129 (03/18 0737)  Labs: Recent Labs    11/24/19 1536 11/24/19 1645 11/24/19 2103 11/25/19 0032 11/25/19 0634 11/25/19 0758  HGB 12.5*  --   --   --  11.3*  --   HCT 39.1  --   --   --  36.4*  --   PLT 297  --   --   --  272  --   APTT  --  29  --   --   --   --   LABPROT  --  14.3  --   --   --   --   INR  --  1.1  --   --   --   --   HEPARINUNFRC  --   --   --  0.17*  --  0.27*  CREATININE 0.84  --   --   --  0.84  --   TROPONINIHS 100* 95* 93*  --   --   --     Estimated Creatinine Clearance: 123.1 mL/min (by C-G formula based on SCr of 0.84 mg/dL).  Assessment: 64 yo male to start on heparin drip for chest pain/ACS. No oral anticoagulants noted on PTA med list.   3/18 Infusion started at 1300 units/hr 3/18 0032 HL 0.17, increase to 1550 units/hr 3/18 0758 HL 0.27, bolus 1500 units, increase rate to 1750 units/hr  Goal of Therapy:  Heparin level 0.3-0.7 units/ml Monitor platelets by anticoagulation protocol: Yes   Plan:  3/18 0758 HL 0.27 is slightly subtherapeutic. Will order Heparin 1500 units IV bolus and increase drip rate to 1750 units/hr. Will check Heparin level in 6 hours. Daily CBC while on Heparin drip.  Paulina Fusi, PharmD, BCPS 11/25/2019 9:30 AM

## 2019-11-25 NOTE — ED Notes (Signed)
Pt calling out for help- pt states that he feels like he is having difficulty breathing and may be having an anxiety attack- pt denies chest pain- pt O2 sats stay stable at 96%- Dr Kurtis Bushman made aware of pt concerns

## 2019-11-25 NOTE — ED Notes (Signed)
Provider at bedside

## 2019-11-25 NOTE — ED Notes (Signed)
Rounding provider, Rufina Falco, notified of patient's sustained heart rate around 128bpm.  No further orders at this time.  Patient resting quietly in bed at this time.

## 2019-11-25 NOTE — Progress Notes (Signed)
[place] for heparin Indication: chest pain/ACS  Allergies  Allergen Reactions  . Morphine And Related Hives  . Nsaids Other (See Comments)    Gastric bypass  . Opium Hives    Opiate drip when had gastric bypass drug unknown (morphine drip)    Patient Measurements: Height: 5\' 9"  (175.3 cm) Weight: 299 lb (135.6 kg) IBW/kg (Calculated) : 70.7 Heparin Dosing Weight: 102.6 kg  Vital Signs: Temp: 98.2 F (36.8 C) (03/18 1623) Temp Source: Oral (03/18 1623) BP: 87/65 (03/18 1701) Pulse Rate: 130 (03/18 1701)  Labs: Recent Labs    11/24/19 1536 11/24/19 1645 11/24/19 2103 11/25/19 0032 11/25/19 0634 11/25/19 0758 11/25/19 1639  HGB 12.5*  --   --   --  11.3*  --   --   HCT 39.1  --   --   --  36.4*  --   --   PLT 297  --   --   --  272  --   --   APTT  --  29  --   --   --   --   --   LABPROT  --  14.3  --   --   --   --   --   INR  --  1.1  --   --   --   --   --   HEPARINUNFRC  --   --   --  0.17*  --  0.27* 0.27*  CREATININE 0.84  --   --   --  0.84  --   --   TROPONINIHS 100* 95* 93*  --   --   --   --     Estimated Creatinine Clearance: 123.1 mL/min (by C-G formula based on SCr of 0.84 mg/dL).  Assessment: 64 yo male to start on heparin drip for chest pain/ACS. No oral anticoagulants noted on PTA med list.   3/18 Infusion started at 1300 units/hr 3/18 0032 HL 0.17, increase to 1550 units/hr 3/18 0758 HL 0.27, bolus 1500 units, increase rate to 1750 units/hr  Goal of Therapy:  Heparin level 0.3-0.7 units/ml Monitor platelets by anticoagulation protocol: Yes   Plan:  3/18 1639 HL 0.27 again,  slightly subtherapeutic. Will order Heparin 1500 units IV bolus and increase drip rate to 1950 units/hr. Will check Heparin level in 6 hours. Daily CBC while on Heparin drip.  Chinita Greenland PharmD Clinical Pharmacist 11/25/2019

## 2019-11-25 NOTE — ED Notes (Signed)
Pt assisted to toilet to have BM 

## 2019-11-25 NOTE — Plan of Care (Signed)
?  Problem: Education: ?Goal: Ability to verbalize understanding of medication therapies will improve ?Outcome: Progressing ?  ?Problem: Education: ?Goal: Knowledge of General Education information will improve ?Description: Including pain rating scale, medication(s)/side effects and non-pharmacologic comfort measures ?Outcome: Progressing ?  ?

## 2019-11-25 NOTE — Progress Notes (Signed)
PROGRESS NOTE    William [name].  XY:[id number] DOB: Dec 28, 1955 DOA: 11/24/2019 PCP: Adin Hector, MD    Brief Narrative:  William [name]. is a 64 y.o. male with medical history significant for morbid obesity, GERD, Diabetes mellitus and hypertension who presents to the ER for evaluation of shortness of breath mostly with exertion associated with 2 pillow orthopnea, PND, malaise and fatigue.  Patient states that he received his [employer] COVID Vaccine about 5 days ago and his symptoms started 2 days after he received the vaccine.  He also complains of exertional chest pain but denies having any nausea, vomiting, diaphoresis or palpitation.      Consultants:   cards  Procedures:  CTA chest: IMPRESSION: No evidence of pulmonary embolus. Moderate bilateral pleural effusions. Dependent and bibasilar atelectasis. 4.6 cm rim calcified mass off the upper pole of the right kidney. This could be further evaluated with elective renal ultrasound or abdominal CT/MRI. Coronary artery disease, aortic atherosclerosis.  Antimicrobials:   none   Subjective: Pt very anxious and snappy. .states cant breath, panicky, but o2 sat on RM 96%. No chest pain. States the "specialist dont know what they are doing". Aggressive with me , but tells me "sorry I am not trying to lash out at you".  Objective: Vitals:   11/25/19 1030 11/25/19 1100 11/25/19 1200 11/25/19 1623  BP:    99/66  Pulse:    (!) 115  Resp: 19 (!) 21 (!) 21   Temp:    98.2 F (36.8 C)  TempSrc:    Oral  SpO2:    95%  Weight:      Height:        Intake/Output Summary (Last 24 hours) at 11/25/2019 1627 Last data filed at 11/25/2019 0803 Gross per 24 hour  Intake 150 ml  Output 1350 ml  Net -1200 ml   Filed Weights   11/24/19 1540  Weight: 135.6 kg    Examination:  General exam: anxious, snappy Respiratory system: Clear to auscultation. Respiratory effort normal. Cardiovascular system: S1 & S2  heard, RRR. No JVD, murmurs, rubs, gallops or clicks. No pedal edema. Gastrointestinal system: Abdomen is nondistended, soft and nontender. Normal bowel sounds heard. Central nervous system: Alert and oriented. No focal neurological deficits. Extremities: +b/l edema.  Skin: warm, dry Psychiatry: Judgement and insight appear normal. Mood & affect appropriate.     Data Reviewed: I have personally reviewed following labs and imaging studies  CBC: Recent Labs  Lab 11/24/19 1536 11/25/19 0634  WBC 9.5 8.5  NEUTROABS 6.3  --   HGB 12.5* 11.3*  HCT 39.1 36.4*  MCV 81.3 82.0  PLT 297 Q000111Q   Basic Metabolic Panel: Recent Labs  Lab 11/24/19 1536 11/25/19 0634  NA 138 138  K 4.2 3.7  CL 109 106  CO2 19* 22  GLUCOSE 193* 192*  BUN 19 22  CREATININE 0.84 0.84  CALCIUM 8.6* 8.2*   GFR: Estimated Creatinine Clearance: 123.1 mL/min (by C-G formula based on SCr of 0.84 mg/dL). Liver Function Tests: Recent Labs  Lab 11/24/19 1536  AST 41  ALT 64*  ALKPHOS 75  BILITOT 0.8  PROT 6.8  ALBUMIN 3.8   No results for input(s): LIPASE, AMYLASE in the last 168 hours. No results for input(s): AMMONIA in the last 168 hours. Coagulation Profile: Recent Labs  Lab 11/24/19 1645  INR 1.1   Cardiac Enzymes: No results for input(s): CKTOTAL, CKMB, CKMBINDEX, TROPONINI in the last  168 hours. BNP (last 3 results) No results for input(s): PROBNP in the last 8760 hours. HbA1C: No results for input(s): HGBA1C in the last 72 hours. CBG: Recent Labs  Lab 11/24/19 2113  GLUCAP 222*   Lipid Profile: No results for input(s): CHOL, HDL, LDLCALC, TRIG, CHOLHDL, LDLDIRECT in the last 72 hours. Thyroid Function Tests: No results for input(s): TSH, T4TOTAL, FREET4, T3FREE, THYROIDAB in the last 72 hours. Anemia Panel: No results for input(s): VITAMINB12, FOLATE, FERRITIN, TIBC, IRON, RETICCTPCT in the last 72 hours. Sepsis Labs: No results for input(s): PROCALCITON, LATICACIDVEN in the last  168 hours.  No results found for this or any previous visit (from the past 240 hour(s)).       Radiology Studies: DG Chest 2 View  Result Date: 11/25/2019 CLINICAL DATA:  Short of breath. EXAM: CHEST - 2 VIEW COMPARISON:  11/24/2019 FINDINGS: Mild cardiac enlargement. Small bilateral pleural effusions and pulmonary vascular congestion are again seen. No airspace consolidation. Visualized osseous structures unremarkable. IMPRESSION: 1. Mild CHF.  Unchanged. Electronically Signed   By: Kerby Moors M.D.   On: 11/25/2019 08:46   CT ANGIO CHEST PE W OR WO CONTRAST  Result Date: 11/24/2019 CLINICAL DATA:  Recent COVID vaccine. Labored breathing. Pleuritic chest pain, shortness of breath. EXAM: CT ANGIOGRAPHY CHEST WITH CONTRAST TECHNIQUE: Multidetector CT imaging of the chest was performed using the standard protocol during bolus administration of intravenous contrast. Multiplanar CT image reconstructions and MIPs were obtained to evaluate the vascular anatomy. CONTRAST:  56mL OMNIPAQUE IOHEXOL 350 MG/ML SOLN COMPARISON:  None. FINDINGS: Cardiovascular: No filling defects in the pulmonary arteries to suggest pulmonary emboli. Heart is mildly enlarged. Coronary artery and aortic calcifications. No aneurysm. Mediastinum/Nodes: No mediastinal, hilar, or axillary adenopathy. Trachea and esophagus are unremarkable. Thyroid unremarkable. Lungs/Pleura: Moderate bilateral pleural effusions. Dependent and bibasilar ground-glass atelectasis. No confluent opacities otherwise. Upper Abdomen: Imaging into the upper abdomen shows no acute findings. Rim calcified mass off the upper pole of the right kidney measures 4.6 cm. Musculoskeletal: Chest wall soft tissues are unremarkable. No acute bony abnormality. Review of the MIP images confirms the above findings. IMPRESSION: No evidence of pulmonary embolus. Moderate bilateral pleural effusions. Dependent and bibasilar atelectasis. 4.6 cm rim calcified mass off the upper  pole of the right kidney. This could be further evaluated with elective renal ultrasound or abdominal CT/MRI. Coronary artery disease, aortic atherosclerosis. Electronically Signed   By: Rolm Baptise M.D.   On: 11/24/2019 14:13   US RENAL  Result Date: 11/24/2019 CLINICAL DATA:  Right renal mass on CT EXAM: RENAL / URINARY TRACT ULTRASOUND COMPLETE COMPARISON:  CT 11/24/2019 FINDINGS: Right Kidney: Renal measurements: 12.7 x 7.3 x 6.6 cm = volume: 319 mL. Cortical echogenicity is normal. No hydronephrosis. Cyst in the upper pole measuring 5.1 x 4.1 x 4.4 cm corresponding to the CT abnormality. Additional cyst in the lower pole measuring 3 x 2.6 x 2.6 cm. Left Kidney: Renal measurements: 11.4 x 6.7 x 6.2 cm = volume: 247 mL. Echogenicity within normal limits. No mass or hydronephrosis visualized. Bladder: Poorly visible.  Bladder is empty Other: None. IMPRESSION: 1. Right renal cysts. 2. The kidneys are otherwise normal. Electronically Signed   By: Donavan Foil M.D.   On: 11/24/2019 18:57        Scheduled Meds: . carvedilol  12.5 mg Oral BID WC  . diltiazem  10 mg Intravenous Once  . ferrous sulfate  325 mg Oral Daily  . FLUoxetine  20 mg Oral  QPM  . furosemide  40 mg Intravenous Q12H  . insulin detemir  10 Units Subcutaneous Daily  . multivitamin with minerals  1 tablet Oral Daily  . pantoprazole  40 mg Oral Daily  . sodium chloride flush  3 mL Intravenous Q12H  . vitamin B-12  2,500 mcg Oral Daily   Continuous Infusions: . sodium chloride    . diltiazem (CARDIZEM) infusion    . heparin 1,750 Units/hr (11/25/19 1114)    Assessment & Plan:   Principal Problem:   Acute on chronic combined systolic and diastolic CHF (congestive heart failure) (HCC) Active Problems:   Type 2 diabetes mellitus without complication (HCC)   GERD (gastroesophageal reflux disease)   Depression   Renal mass, right   Acute on chronic combined systolic and diastolic dysfunction CHF He has elevated BNP  levels and CT scan shows bilateral pleural effusions Patient had a 2D echocardiogram in 2017 that showed an LVEF of 40% Echo pending. continue Lasix 40 mg IV every 12 Continue  carvedilol   Elevated Troponin- per cards not NSTEMI. Demand ischemia 2/2 tachycardia and chf Echo pending Continue  heparin drip Cardiology following Continue Plavix and beta blockers  PRN Nitrates  aflutter rvr.  On heparin gtt Started on cardizem and coreg Will d/w cards about TEE ...>if no  Clot , do CV?   Diabetes mellitus Place patient on Glu[place]ander Carb control   Hypertensive urgency- resolved Optimize blood pressure control continue cardizem, coreg, lasix   Depression Continue Fluoxetine  Anxiety-while in hospital.  Will start prn ativan   Renal mass Noted on CTA of the chest involving the right kidney renal ultrasound revealed renal cyst.    Morbid Obesity (BMI 44.15) Will need to lose weight .    DVT prophylaxis: Heparin Code Status: Full Family Communication: None at bedside Disposition Plan: Back to home Barrier: Patient still with symptomatic CHF and elevated troponin.  May need TEE CV.       LOS: 1 day   Time spent: 45 minutes with more than 50% COC    Nolberto Hanlon, MD [facility] Pager [phone]  If 7PM-7AM, please contact night-coverage [email or web address] Password [facility] 11/25/2019, 4:27 PM

## 2019-11-25 NOTE — ED Notes (Signed)
Pt sitting on side of bed- NAD noted- pt denies needs at this time

## 2019-11-25 NOTE — Progress Notes (Signed)
Heart rate irregular, 120-130.  Dr Ubaldo Glassing is aware.

## 2019-11-26 ENCOUNTER — Inpatient Hospital Stay: Payer: Managed Care, Other (non HMO) | Admitting: Anesthesiology

## 2019-11-26 ENCOUNTER — Encounter
Admission: EM | Disposition: A | Discharge status: Home / Self Care | Payer: Self-pay | Source: Home / Self Care | Attending: Internal Medicine

## 2019-11-26 ENCOUNTER — Inpatient Hospital Stay
Admit: 2019-11-26 | Discharge: 2019-11-26 | Disposition: A | Discharge status: Home / Self Care | Payer: Managed Care, Other (non HMO) | Attending: Cardiology | Admitting: Cardiology

## 2019-11-26 HISTORY — PX: CARDIOVERSION: SHX1299

## 2019-11-26 LAB — BASIC METABOLIC PANEL
Anion gap: 10 (ref 5–15)
BUN: 30 mg/dL — ABNORMAL HIGH (ref 8–23)
CO2: 21 mmol/L — ABNORMAL LOW (ref 22–32)
Calcium: 8.3 mg/dL — ABNORMAL LOW (ref 8.9–10.3)
Chloride: 105 mmol/L (ref 98–111)
Creatinine, Ser: 1.05 mg/dL (ref 0.61–1.24)
GFR calc Af Amer: >60 mL/min (ref >60)
GFR calc non Af Amer: >60 mL/min (ref >60)
Glucose, Bld: 190 mg/dL — ABNORMAL HIGH (ref 70–99)
Potassium: 3.7 mmol/L (ref 3.5–5.1)
Sodium: 136 mmol/L (ref 135–145)

## 2019-11-26 LAB — CBC
HCT: 36.5 % — ABNORMAL LOW (ref 39.0–52.0)
Hemoglobin: 11.6 g/dL — ABNORMAL LOW (ref 13.0–17.0)
MCH: 25.8 pg — ABNORMAL LOW (ref 26.0–34.0)
MCHC: 31.8 g/dL (ref 30.0–36.0)
MCV: 81.3 fL (ref 80.0–100.0)
Platelets: 277 10*3/uL (ref 150–400)
RBC: 4.49 MIL/uL (ref 4.22–5.81)
RDW: 16.5 % — ABNORMAL HIGH (ref 11.5–15.5)
WBC: 9.5 10*3/uL (ref 4.0–10.5)
nRBC: 0.5 % — ABNORMAL HIGH (ref 0.0–0.2)

## 2019-11-26 LAB — ECHOCARDIOGRAM COMPLETE
Height: 69 in
Weight: 5571.2 oz

## 2019-11-26 LAB — GLUCOSE, CAPILLARY: Glucose-Capillary: 189 mg/dL — ABNORMAL HIGH (ref 70–99)

## 2019-11-26 LAB — HEPARIN LEVEL (UNFRACTIONATED)
Heparin Unfractionated: 0.55 IU/mL (ref 0.30–0.70)
Heparin Unfractionated: 0.7 IU/mL (ref 0.30–0.70)

## 2019-11-26 SURGERY — CARDIOVERSION
Anesthesia: General

## 2019-11-26 MED ORDER — AMIODARONE HCL IN DEXTROSE 360-4.14 MG/200ML-% IV SOLN
INTRAVENOUS | Status: AC
  Filled 2019-11-26: qty 200

## 2019-11-26 MED ORDER — FUROSEMIDE 20 MG PO TABS: 10.0000 mg | ORAL_TABLET | Freq: Every day | ORAL | Status: DC | PRN

## 2019-11-26 MED ORDER — AMIODARONE HCL IN DEXTROSE 360-4.14 MG/200ML-% IV SOLN: 30.0000 mg/h | INTRAVENOUS | Status: DC

## 2019-11-26 MED ORDER — CARVEDILOL 3.125 MG PO TABS
3.1250 mg | ORAL_TABLET | Freq: Two times a day (BID) | ORAL | Status: DC
  Administered 2019-11-26 – 2019-11-27 (×2): 3.125 mg via ORAL
  Filled 2019-11-26 (×2): qty 1

## 2019-11-26 MED ORDER — SODIUM CHLORIDE 0.9 % IV SOLN: INTRAVENOUS | Status: DC | PRN

## 2019-11-26 MED ORDER — AMIODARONE HCL IN DEXTROSE 360-4.14 MG/200ML-% IV SOLN
60.0000 mg/h | INTRAVENOUS | Status: DC
  Administered 2019-11-26 (×2): 60 mg/h via INTRAVENOUS
  Filled 2019-11-26: qty 200

## 2019-11-26 MED ORDER — APIXABAN 5 MG PO TABS
5.0000 mg | ORAL_TABLET | Freq: Two times a day (BID) | ORAL | Status: DC
  Administered 2019-11-26 – 2019-11-27 (×3): 5 mg via ORAL
  Filled 2019-11-26 (×3): qty 1

## 2019-11-26 MED ORDER — SACUBITRIL-VALSARTAN 24-26 MG PO TABS
0.5000 | ORAL_TABLET | Freq: Two times a day (BID) | ORAL | Status: DC
  Administered 2019-11-26 (×2): 0.5 via ORAL
  Filled 2019-11-26 (×2): qty 1

## 2019-11-26 MED ORDER — MENTHOL 3 MG MT LOZG
1.0000 | LOZENGE | OROMUCOSAL | Status: DC | PRN
  Administered 2019-11-26: 3 mg via ORAL
  Filled 2019-11-26: qty 9

## 2019-11-26 MED ORDER — AMIODARONE LOAD VIA INFUSION
150.0000 mg | Freq: Once | INTRAVENOUS | Status: AC
  Administered 2019-11-26: 09:00:00 150 mg via INTRAVENOUS
  Filled 2019-11-26: qty 83.34

## 2019-11-26 MED ORDER — PROPOFOL 10 MG/ML IV BOLUS
INTRAVENOUS | Status: DC | PRN
  Administered 2019-11-26 (×2): 30 mg via INTRAVENOUS
  Administered 2019-11-26: 70 mg via INTRAVENOUS

## 2019-11-26 MED ORDER — AMIODARONE HCL 200 MG PO TABS
200.0000 mg | ORAL_TABLET | Freq: Two times a day (BID) | ORAL | Status: DC
  Administered 2019-11-26 – 2019-11-27 (×3): 200 mg via ORAL
  Filled 2019-11-26 (×3): qty 1

## 2019-11-26 NOTE — Progress Notes (Signed)
ReDS Pro not appropriate for this pt due to BMI < 38. 

## 2019-11-26 NOTE — Transfer of Care (Signed)
Immediate Anesthesia Transfer of Care Note  Patient: William [name].  Procedure(s) Performed: CARDIOVERSION (N/A )  Patient Location: Cath Lab  Anesthesia Type:MAC  Level of Consciousness: drowsy and responds to stimulation  Airway & Oxygen Therapy: Patient Spontanous Breathing and Patient connected to face mask oxygen  Post-op Assessment: Report given to RN and Post -op Vital signs reviewed and stable  Post vital signs: Reviewed and stable  Last Vitals:  Vitals Value Taken Time  BP 106/88 11/26/19 1154  Temp    Pulse 70 11/26/19 1200  Resp 23 11/26/19 1200  SpO2 99 % 11/26/19 1200    Last Pain:  Vitals:   11/26/19 1038  TempSrc: Oral  PainSc: 0-No pain      Patients Stated Pain Goal: 0 (14/99/69 2493)  Complications: No apparent anesthesia complications

## 2019-11-26 NOTE — Progress Notes (Signed)
[place] for heparin Indication: chest pain/ACS  Allergies  Allergen Reactions  . Morphine And Related Hives  . Nsaids Other (See Comments)    Gastric bypass  . Opium Hives    Opiate drip when had gastric bypass drug unknown (morphine drip)    Patient Measurements: Height: 5\' 9"  (175.3 cm) Weight: 299 lb (135.6 kg) IBW/kg (Calculated) : 70.7 Heparin Dosing Weight: 102.6 kg  Vital Signs: Temp: 98.2 F (36.8 C) (03/18 1623) Temp Source: Oral (03/18 1623) BP: 108/53 (03/18 1912) Pulse Rate: 108 (03/18 1912)  Labs: Recent Labs     0000 11/24/19 1536 11/24/19 1645 11/24/19 2103 11/25/19 0032 11/25/19 [id number] 11/25/19 0758 11/25/19 1639 11/26/19 0118  HGB   < > 12.5*  --   --   --  11.3*  --   --  11.6*  HCT  --  39.1  --   --   --  36.4*  --   --  36.5*  PLT  --  297  --   --   --  272  --   --  277  APTT  --   --  29  --   --   --   --   --   --   LABPROT  --   --  14.3  --   --   --   --   --   --   INR  --   --  1.1  --   --   --   --   --   --   HEPARINUNFRC  --   --   --   --    < >  --  0.27* 0.27* 0.55  CREATININE  --  0.84  --   --   --  0.84  --   --  1.05  TROPONINIHS  --  100* 95* 93*  --   --   --   --   --    < > = values in this interval not displayed.    Estimated Creatinine Clearance: 98.5 mL/min (by C-G formula based on SCr of 1.05 mg/dL).  Assessment: 64 yo male to start on heparin drip for chest pain/ACS. No oral anticoagulants noted on PTA med list.   3/18 Infusion started at 1300 units/hr 3/18 0032 HL 0.17, increase to 1550 units/hr 3/18 0758 HL 0.27, bolus 1500 units, increase rate to 1750 units/hr 3/19 0118 HL 0.55, therapeutic x 1.  CBC stable  Goal of Therapy:  Heparin level 0.3-0.7 units/ml Monitor platelets by anticoagulation protocol: Yes   Plan:  3/19 0118 HL 0.55, therapeutic.  Will continue drip at 1950 units/hr. Will check Heparin level in 6 hours to confirm. Daily CBC while on Heparin  drip.  Hart Robinsons, PharmD Clinical Pharmacist 11/26/2019

## 2019-11-26 NOTE — Anesthesia Postprocedure Evaluation (Signed)
Anesthesia Post Note  Patient: William [name].  Procedure(s) Performed: CARDIOVERSION (N/A )  Patient location during evaluation: Other (specials recovery) Anesthesia Type: General Level of consciousness: awake and alert and oriented Pain management: pain level controlled Vital Signs Assessment: post-procedure vital signs reviewed and stable Respiratory status: spontaneous breathing, nonlabored ventilation and respiratory function stable Cardiovascular status: blood pressure returned to baseline and stable Postop Assessment: no signs of nausea or vomiting Anesthetic complications: no     Last Vitals:  Vitals:   11/26/19 1159 11/26/19 1200  BP:  (!) 100/58  Pulse: 71 70  Resp: (!) 24 20  Temp:    SpO2: 98% 99%    Last Pain:  Vitals:   11/26/19 1038  TempSrc: Oral  PainSc: 0-No pain                 Camrynn Mcclintic

## 2019-11-26 NOTE — Progress Notes (Signed)
*  PRELIMINARY RESULTS* Echocardiogram 2D Echocardiogram has been performed.  Sherrie Sport 11/26/2019, 8:15 AM

## 2019-11-26 NOTE — Anesthesia Preprocedure Evaluation (Signed)
Anesthesia Evaluation  Patient identified by MRN, date of birth, ID band Patient awake    Reviewed: Allergy & Precautions, NPO status , Patient's Chart, lab work & pertinent test results  History of Anesthesia Complications Negative for: history of anesthetic complications  Airway Mallampati: II  TM Distance: >3 FB Neck ROM: Full    Dental no notable dental hx.    Pulmonary neg pulmonary ROS, neg sleep apnea, neg COPD,    breath sounds clear to auscultation- rhonchi (-) wheezing      Cardiovascular (-) angina+CHF  (-) CAD, (-) Past MI and (-) Cardiac Stents + dysrhythmias Atrial Fibrillation  Rhythm:Regular Rate:Normal - Systolic murmurs and - Diastolic murmurs    Neuro/Psych neg Seizures PSYCHIATRIC DISORDERS Anxiety Depression negative neurological ROS     GI/Hepatic Neg liver ROS, PUD, GERD  ,  Endo/Other  diabetes, Oral Hypoglycemic Agents  Renal/GU negative Renal ROS     Musculoskeletal  (+) Arthritis ,   Abdominal (+) + obese,   Peds  Hematology  (+) anemia ,   Anesthesia Other Findings Past Medical History: No date: Anxiety No date: Cancer [facility])     Comment:  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 No date: Diabetes mellitus without complication (HCC) No date: GERD (gastroesophageal reflux disease) No date: Iron deficiency anemia No date: Obesity No date: Osteoarthritis of hip     Comment:  right No date: Peptic ulcer disease   Reproductive/Obstetrics                             Anesthesia Physical Anesthesia Plan  ASA: III  Anesthesia Plan: General   Post-op Pain Management:    Induction: Intravenous  PONV Risk Score and Plan: 1 and Propofol infusion  Airway Management Planned: Natural Airway  Additional Equipment:   Intra-op Plan:   Post-operative Plan:   Informed Consent: I have reviewed the patients History and Physical, chart, labs and discussed the procedure including the  risks, benefits and alternatives for the proposed anesthesia with the patient or authorized representative who has indicated his/her understanding and acceptance.     Dental advisory given  Plan Discussed with: CRNA and Anesthesiologist  Anesthesia Plan Comments:         Anesthesia Quick Evaluation

## 2019-11-26 NOTE — Progress Notes (Signed)
Patient Name: William [name]. Date of Encounter: 11/26/2019  Hospital Problem List     Principal Problem:   Acute on chronic combined systolic and diastolic CHF (congestive heart failure) (HCC) Active Problems:   Type 2 diabetes mellitus without complication (HCC)   GERD (gastroesophageal reflux disease)   Depression   Renal mass, right    Patient Profile     64 y.o.malewith history ofanxiety, diabetes, hypertension, and chronic lbbb who presented to the er with several day history of weakness and fatigue that occurred after getting his [employer] covid 19 shot this past Saturday. In the er he was noted to have what appeard to be sinus tachycardia/flutter with his usual left bundle. He has no chest pain. hsTroponin drawn per er protocol was 100 with subsequent vlue of 95. [name] He denies any rest or exertional chest pain. Chest ct revealed no pulmonary emboli, moderate bilateral pleural Effusions. There was noted to be a 4.6 cm calcified mass off the upper pole of the right kidney. BNP was 837.  Subjective   Remains tachycardic with rates fairly steady at one 29-1 thirty.  This is consistent with probable atrial flutter with 2-1 block.  Unaware of his heart beating fast.  Somewhat weakened.  Inpatient Medications    . amiodarone  150 mg Intravenous Once  . ferrous sulfate  325 mg Oral Daily  . FLUoxetine  20 mg Oral QPM  . insulin detemir  10 Units Subcutaneous Daily  . multivitamin with minerals  1 tablet Oral Daily  . pantoprazole  40 mg Oral Daily  . sodium chloride flush  3 mL Intravenous Q12H  . vitamin B-12  2,500 mcg Oral Daily    Vital Signs    Vitals:   11/25/19 1653 11/25/19 1701 11/25/19 1912 11/26/19 0457  BP: (!) 75/65 (!) 87/65 (!) 108/53 95/65  Pulse: 66 (!) 130 (!) 108 (!) 123  Resp:    20  Temp:    98.3 F (36.8 C)  TempSrc:    Oral  SpO2: 92% 95%  99%  Weight:    (!) 157.9 kg  Height:        Intake/Output Summary (Last 24 hours) at  11/26/2019 0725 Last data filed at 11/26/2019 0501 Gross per 24 hour  Intake --  Output 1550 ml  Net -1550 ml   Filed Weights   11/24/19 1540 11/26/19 0457  Weight: 135.6 kg (!) 157.9 kg    Physical Exam    GEN: Well nourished, well developed, in no acute distress.  HEENT: normal.  Neck: Supple, no JVD, carotid bruits, or masses. Cardiac: RRR, no murmurs, rubs, or gallops. No clubbing, cyanosis, edema.  Radials/DP/PT 2+ and equal bilaterally.  Respiratory:  Respirations regular and unlabored, clear to auscultation bilaterally. GI: Soft, nontender, nondistended, BS + x 4. MS: no deformity or atrophy. Skin: warm and dry, no rash. Neuro:  Strength and sensation are intact. Psych: Normal affect.  Labs    CBC Recent Labs    11/24/19 1536 11/24/19 1536 11/25/19 0634 11/26/19 0118  WBC 9.5   < > 8.5 9.5  NEUTROABS 6.3  --   --   --   HGB 12.5*   < > 11.3* 11.6*  HCT 39.1   < > 36.4* 36.5*  MCV 81.3   < > 82.0 81.3  PLT 297   < > 272 277   < > = values in this interval not displayed.   Basic Metabolic Panel Recent Labs  11/25/19 0634 11/26/19 0118  NA 138 136  K 3.7 3.7  CL 106 105  CO2 22 21*  GLUCOSE 192* 190*  BUN 22 30*  CREATININE 0.84 1.05  CALCIUM 8.2* 8.3*   Liver Function Tests Recent Labs    11/24/19 1536  AST 41  ALT 64*  ALKPHOS 75  BILITOT 0.8  PROT 6.8  ALBUMIN 3.8   No results for input(s): LIPASE, AMYLASE in the last 72 hours. Cardiac Enzymes No results for input(s): CKTOTAL, CKMB, CKMBINDEX, TROPONINI in the last 72 hours. BNP Recent Labs    11/24/19 1536 11/25/19 0634  BNP 837.0* 806.0*   D-Dimer No results for input(s): DDIMER in the last 72 hours. Hemoglobin A1C No results for input(s): HGBA1C in the last 72 hours. Fasting Lipid Panel No results for input(s): CHOL, HDL, LDLCALC, TRIG, CHOLHDL, LDLDIRECT in the last 72 hours. Thyroid Function Tests No results for input(s): TSH, T4TOTAL, T3FREE, THYROIDAB in the last 72  hours.  Invalid input(s): FREET3  Telemetry    Probable atrial flutter with 2-1 block versus atrial tachycardia with left bundle which is chronic  ECG    Left bundle branch block with tachycardia atypical atrial flutter versus sinus tach.  Radiology    DG Chest 2 View  Result Date: 11/25/2019 CLINICAL DATA:  Short of breath. EXAM: CHEST - 2 VIEW COMPARISON:  11/24/2019 FINDINGS: Mild cardiac enlargement. Small bilateral pleural effusions and pulmonary vascular congestion are again seen. No airspace consolidation. Visualized osseous structures unremarkable. IMPRESSION: 1. Mild CHF.  Unchanged. Electronically Signed   By: Kerby Moors M.D.   On: 11/25/2019 08:46   CT ANGIO CHEST PE W OR WO CONTRAST  Result Date: 11/24/2019 CLINICAL DATA:  Recent COVID vaccine. Labored breathing. Pleuritic chest pain, shortness of breath. EXAM: CT ANGIOGRAPHY CHEST WITH CONTRAST TECHNIQUE: Multidetector CT imaging of the chest was performed using the standard protocol during bolus administration of intravenous contrast. Multiplanar CT image reconstructions and MIPs were obtained to evaluate the vascular anatomy. CONTRAST:  79mL OMNIPAQUE IOHEXOL 350 MG/ML SOLN COMPARISON:  None. FINDINGS: Cardiovascular: No filling defects in the pulmonary arteries to suggest pulmonary emboli. Heart is mildly enlarged. Coronary artery and aortic calcifications. No aneurysm. Mediastinum/Nodes: No mediastinal, hilar, or axillary adenopathy. Trachea and esophagus are unremarkable. Thyroid unremarkable. Lungs/Pleura: Moderate bilateral pleural effusions. Dependent and bibasilar ground-glass atelectasis. No confluent opacities otherwise. Upper Abdomen: Imaging into the upper abdomen shows no acute findings. Rim calcified mass off the upper pole of the right kidney measures 4.6 cm. Musculoskeletal: Chest wall soft tissues are unremarkable. No acute bony abnormality. Review of the MIP images confirms the above findings. IMPRESSION: No  evidence of pulmonary embolus. Moderate bilateral pleural effusions. Dependent and bibasilar atelectasis. 4.6 cm rim calcified mass off the upper pole of the right kidney. This could be further evaluated with elective renal ultrasound or abdominal CT/MRI. Coronary artery disease, aortic atherosclerosis. Electronically Signed   By: Rolm Baptise M.D.   On: 11/24/2019 14:13   US RENAL  Result Date: 11/24/2019 CLINICAL DATA:  Right renal mass on CT EXAM: RENAL / URINARY TRACT ULTRASOUND COMPLETE COMPARISON:  CT 11/24/2019 FINDINGS: Right Kidney: Renal measurements: 12.7 x 7.3 x 6.6 cm = volume: 319 mL. Cortical echogenicity is normal. No hydronephrosis. Cyst in the upper pole measuring 5.1 x 4.1 x 4.4 cm corresponding to the CT abnormality. Additional cyst in the lower pole measuring 3 x 2.6 x 2.6 cm. Left Kidney: Renal measurements: 11.4 x 6.7 x 6.2 cm =  volume: 247 mL. Echogenicity within normal limits. No mass or hydronephrosis visualized. Bladder: Poorly visible.  Bladder is empty Other: None. IMPRESSION: 1. Right renal cysts. 2. The kidneys are otherwise normal. Electronically Signed   By: Donavan Foil M.D.   On: 11/24/2019 18:57    Assessment & Plan    Patient is a 64 year old male with history of a mild cardiomyopathy, likely nonischemic given negative functional study in the past with chronic left bundle branch block who presented to the ER with complaints of shortness of breath and fatigue which occurred after getting a COVID-19 [employer] vaccine several days ago. He has felt poorly since this occurred. He presented to the emergency room with complaints of shortness of breath and fatigue. He was noted to be in tachycardia probable sinus tachycardia although SVT could be playing a role.  He has a chronic left bundle making rhythm somewhat difficult to assess. He denied any chest pain but did have some shortness of breath. Patient had a serum troponin drawn apparently per ER protocol which  initially was 100 with subsequent value of 95.  1.  Atrial flutter-Will attempt cardioversion later on today.  Prior to cardioversion will give IV bolus of amiodarone.  We will continue with heparin.  After cardioversion will discontinue heparin and placed on Eliquis.  Will continue IV amiodarone load over the next 24 hours if tolerated hemodynamically.  Then will convert to p.o.  Will attempt short course of amiodarone given his age.  Risk and benefits of cardioversion discussed with the patient.  2. Elevated troponin-likely demand secondary to the tachycardia versus possible myocarditis. No clinical evidence of acute coronary syndrome. This does not appear to be a non-STEMI. Troponin is only mildly elevated consistent with demand and remains flat. . We will proceed with an echocardiogram when heart rate improves to evaluate LV function. Patient has a known ejection fraction 45% from previously. . This is not a nstemi  3. Pleural effusions-etiology unclear. Will further evaluate with an echo. Based on current scenario, patient does not appear to require invasive evaluation from a cardiac standpoint as he has no chest pain. Continue with diuresis.   4 renal mass-appears to be cysts.   Signed, Javier Docker Yitzchok Carriger MD 11/26/2019, 7:25 AM  Pager: [phone]

## 2019-11-26 NOTE — CV Procedure (Signed)
Electrical Cardioversion Procedure Note   Procedure: Electrical Cardioversion Indications:  Atrial Flutter  Procedure Details Consent: Risks of procedure as well as the alternatives and risks of each were explained to the (patient/caregiver).  Consent for procedure obtained. Time Out: Verified patient identification, verified procedure, site/side was marked, verified correct patient position, special equipment/implants available, medications/allergies/relevent history reviewed, required imaging and test results available.  Performed  Patient placed on cardiac monitor, pulse oximetry, supplemental oxygen as necessary.  Sedation given: Propofol as per anesthesia Pacer pads placed anterior and posterior chest.  Cardioverted 1 time(s).  Cardioverted at biphasic 100J.  Evaluation Findings: Post procedure EKG shows: NSR LBBB rate of 75. Complications: None Patient did tolerate procedure well.   Lujean Amel MD 11/26/19 11:45 am

## 2019-11-26 NOTE — Progress Notes (Signed)
PROGRESS NOTE    William [name].  [id number] DOB: September 24, 1955 DOA: 11/24/2019 PCP: Adin Hector, MD    Brief Narrative:  William [name]. is a 64 y.o. male with medical history significant for morbid obesity, GERD, Diabetes mellitus and hypertension who presents to the ER for evaluation of shortness of breath mostly with exertion associated with 2 pillow orthopnea, PND, malaise and fatigue.  Patient states that he received his [place] [employer] [place] COVID Vaccine about 5 days ago and his symptoms started 2 days after he received the vaccine.  He also complains of exertional chest pain but denies having any nausea, vomiting, diaphoresis or palpitation.      Consultants:   cards  Procedures: s/p CV CTA chest: IMPRESSION: No evidence of pulmonary embolus. Moderate bilateral pleural effusions. Dependent and bibasilar atelectasis. 4.6 cm rim calcified mass off the upper pole of the right kidney. This could be further evaluated with elective renal ultrasound or abdominal CT/MRI. Coronary artery disease, aortic atherosclerosis.  Antimicrobials:   none   Subjective: Pt very anxious and snappy. .states cant breath, panicky, but o2 sat on RM 96%. No chest pain. States the "specialist dont know what they are doing". Aggressive with me , but tells me "sorry I am not trying to lash out at you".  Objective: Vitals:   11/25/19 1701 11/25/19 1912 11/26/19 0457 11/26/19 0809  BP: (!) 87/65 (!) 108/53 95/65 104/82  Pulse: (!) 130 (!) 108 (!) 123 96  Resp:   20 18  Temp:   98.3 F (36.8 C) 98.4 F (36.9 C)  TempSrc:   Oral   SpO2: 95%  99%   Weight:   (!) 157.9 kg   Height:        Intake/Output Summary (Last 24 hours) at 11/26/2019 0854 Last data filed at 11/26/2019 0501 Gross per 24 hour  Intake --  Output 1050 ml  Net -1050 ml   Filed Weights   11/24/19 1540 11/26/19 0457  Weight: 135.6 kg (!) 157.9 kg    Examination:  General exam: comfortable , calm, sitting in  chair Respiratory system: Clear to auscultation. Respiratory effort normal. Cardiovascular system: S1 & S2 heard, RRR. No JVD, murmurs, rubs, gallops or clicks.  Gastrointestinal system: Abdomen is nondistended, obese,soft and nontender. Normal bowel sounds heard. Central nervous system: Alert and oriented. No focal neurological deficits. Extremities: +b/l edema.  Skin: warm, dry Psychiatry: Judgement and insight appear normal. Mood & affect appropriate.     Data Reviewed: I have personally reviewed following labs and imaging studies  CBC: Recent Labs  Lab 11/24/19 1536 11/25/19 0634 11/26/19 0118  WBC 9.5 8.5 9.5  NEUTROABS 6.3  --   --   HGB 12.5* 11.3* 11.6*  HCT 39.1 36.4* 36.5*  MCV 81.3 82.0 81.3  PLT 297 272 [id number]   Basic Metabolic Panel: Recent Labs  Lab 11/24/19 1536 11/25/19 0634 11/26/19 0118  NA 138 138 136  K 4.2 3.7 3.7  CL 109 106 105  CO2 19* 22 21*  GLUCOSE 193* 192* 190*  BUN 19 22 30*  CREATININE 0.84 0.84 1.05  CALCIUM 8.6* 8.2* 8.3*   GFR: Estimated Creatinine Clearance: 107.6 mL/min (by C-G formula based on SCr of 1.05 mg/dL). Liver Function Tests: Recent Labs  Lab 11/24/19 1536  AST 41  ALT 64*  ALKPHOS 75  BILITOT 0.8  PROT 6.8  ALBUMIN 3.8   No results for input(s): LIPASE, AMYLASE in the last 168 hours. No results  for input(s): AMMONIA in the last 168 hours. Coagulation Profile: Recent Labs  Lab 11/24/19 1645  INR 1.1   Cardiac Enzymes: No results for input(s): CKTOTAL, CKMB, CKMBINDEX, TROPONINI in the last 168 hours. BNP (last 3 results) No results for input(s): PROBNP in the last 8760 hours. HbA1C: No results for input(s): HGBA1C in the last 72 hours. CBG: Recent Labs  Lab 11/24/19 2113  GLUCAP 222*   Lipid Profile: No results for input(s): CHOL, HDL, LDLCALC, TRIG, CHOLHDL, LDLDIRECT in the last 72 hours. Thyroid Function Tests: No results for input(s): TSH, T4TOTAL, FREET4, T3FREE, THYROIDAB in the last 72  hours. Anemia Panel: No results for input(s): VITAMINB12, FOLATE, FERRITIN, TIBC, IRON, RETICCTPCT in the last 72 hours. Sepsis Labs: No results for input(s): PROCALCITON, LATICACIDVEN in the last 168 hours.  No results found for this or any previous visit (from the past 240 hour(s)).       Radiology Studies: DG Chest 2 View  Result Date: 11/25/2019 CLINICAL DATA:  Short of breath. EXAM: CHEST - 2 VIEW COMPARISON:  11/24/2019 FINDINGS: Mild cardiac enlargement. Small bilateral pleural effusions and pulmonary vascular congestion are again seen. No airspace consolidation. Visualized osseous structures unremarkable. IMPRESSION: 1. Mild CHF.  Unchanged. Electronically Signed   By: Kerby Moors M.D.   On: 11/25/2019 08:46   CT ANGIO CHEST PE W OR WO CONTRAST  Result Date: 11/24/2019 CLINICAL DATA:  Recent COVID vaccine. Labored breathing. Pleuritic chest pain, shortness of breath. EXAM: CT ANGIOGRAPHY CHEST WITH CONTRAST TECHNIQUE: Multidetector CT imaging of the chest was performed using the standard protocol during bolus administration of intravenous contrast. Multiplanar CT image reconstructions and MIPs were obtained to evaluate the vascular anatomy. CONTRAST:  67mL OMNIPAQUE IOHEXOL 350 MG/ML SOLN COMPARISON:  None. FINDINGS: Cardiovascular: No filling defects in the pulmonary arteries to suggest pulmonary emboli. Heart is mildly enlarged. Coronary artery and aortic calcifications. No aneurysm. Mediastinum/Nodes: No mediastinal, hilar, or axillary adenopathy. Trachea and esophagus are unremarkable. Thyroid unremarkable. Lungs/Pleura: Moderate bilateral pleural effusions. Dependent and bibasilar ground-glass atelectasis. No confluent opacities otherwise. Upper Abdomen: Imaging into the upper abdomen shows no acute findings. Rim calcified mass off the upper pole of the right kidney measures 4.6 cm. Musculoskeletal: Chest wall soft tissues are unremarkable. No acute bony abnormality. Review of the  MIP images confirms the above findings. IMPRESSION: No evidence of pulmonary embolus. Moderate bilateral pleural effusions. Dependent and bibasilar atelectasis. 4.6 cm rim calcified mass off the upper pole of the right kidney. This could be further evaluated with elective renal ultrasound or abdominal CT/MRI. Coronary artery disease, aortic atherosclerosis. Electronically Signed   By: Rolm Baptise M.D.   On: 11/24/2019 14:13   US RENAL  Result Date: 11/24/2019 CLINICAL DATA:  Right renal mass on CT EXAM: RENAL / URINARY TRACT ULTRASOUND COMPLETE COMPARISON:  CT 11/24/2019 FINDINGS: Right Kidney: Renal measurements: 12.7 x 7.3 x 6.6 cm = volume: 319 mL. Cortical echogenicity is normal. No hydronephrosis. Cyst in the upper pole measuring 5.1 x 4.1 x 4.4 cm corresponding to the CT abnormality. Additional cyst in the lower pole measuring 3 x 2.6 x 2.6 cm. Left Kidney: Renal measurements: 11.4 x 6.7 x 6.2 cm = volume: 247 mL. Echogenicity within normal limits. No mass or hydronephrosis visualized. Bladder: Poorly visible.  Bladder is empty Other: None. IMPRESSION: 1. Right renal cysts. 2. The kidneys are otherwise normal. Electronically Signed   By: Donavan Foil M.D.   On: 11/24/2019 18:57  Scheduled Meds: . amiodarone  150 mg Intravenous Once  . ferrous sulfate  325 mg Oral Daily  . FLUoxetine  20 mg Oral QPM  . insulin detemir  10 Units Subcutaneous Daily  . multivitamin with minerals  1 tablet Oral Daily  . pantoprazole  40 mg Oral Daily  . sodium chloride flush  3 mL Intravenous Q12H  . vitamin B-12  2,500 mcg Oral Daily   Continuous Infusions: . sodium chloride    . amiodarone     Followed by  . amiodarone    . heparin 1,950 Units/hr (11/26/19 0108)    Assessment & Plan:   Principal Problem:   Acute on chronic combined systolic and diastolic CHF (congestive heart failure) (HCC) Active Problems:   Type 2 diabetes mellitus without complication (HCC)   GERD (gastroesophageal  reflux disease)   Depression   Renal mass, right   Acute on chronic combined systolic and diastolic dysfunction CHF He has elevated BNP levels and CT scan shows bilateral pleural effusions Echo <20% switched Lasix 40 mg IV every 12 to po 10mg  prn Continue  Carvedilol Cards following   Elevated Troponin- per cards not NSTEMI. Demand ischemia 2/2 tachycardia and chf Cardiology following PRN Nitrates Echo <20% On beta blk  aflutter rvr.  Heparin gt d/c, started on Eliquis Started on  Coreg and  entrestoS/p CV today, in sr On IV amiodarone for the next 24 hours then for short course of amiodarone p.o.    Diabetes mellitus Place patient on Glucomander Carb control   Hypertensive urgency- resolved Optimize blood pressure control continue cardizem, coreg, lasix   Depression Continue Fluoxetine  Anxiety-while in hospital.  Will start prn ativan   Renal mass Noted on CTA of the chest involving the right kidney renal ultrasound revealed renal cyst.    Morbid Obesity (BMI 44.15) Will need to lose weight .    DVT prophylaxis: eliquis Code Status: Full Family Communication: None at bedside Disposition Plan: Back to home Barrier:on iv amiodarone, s/p CV. If stable will d/c in am if cleared by cards       LOS: 2 days   Time spent: 45 minutes with more than 50% COC    Nolberto Hanlon, MD [facility] Pager [phone]  If 7PM-7AM, please contact night-coverage [email or web address] Password [facility] 11/26/2019, 8:54 AM Patient ID: William [name]., male   DOB: Sep 06, 1956, 64 y.o.   MRN: [id number]

## 2019-11-27 LAB — BASIC METABOLIC PANEL
Anion gap: 10 (ref 5–15)
BUN: 28 mg/dL — ABNORMAL HIGH (ref 8–23)
CO2: 21 mmol/L — ABNORMAL LOW (ref 22–32)
Calcium: 8.3 mg/dL — ABNORMAL LOW (ref 8.9–10.3)
Chloride: 107 mmol/L (ref 98–111)
Creatinine, Ser: 0.84 mg/dL (ref 0.61–1.24)
GFR calc Af Amer: >60 mL/min (ref >60)
GFR calc non Af Amer: >60 mL/min (ref >60)
Glucose, Bld: 188 mg/dL — ABNORMAL HIGH (ref 70–99)
Potassium: 3.6 mmol/L (ref 3.5–5.1)
Sodium: 138 mmol/L (ref 135–145)

## 2019-11-27 LAB — CBC
HCT: 36.4 % — ABNORMAL LOW (ref 39.0–52.0)
Hemoglobin: 11.4 g/dL — ABNORMAL LOW (ref 13.0–17.0)
MCH: 25.9 pg — ABNORMAL LOW (ref 26.0–34.0)
MCHC: 31.3 g/dL (ref 30.0–36.0)
MCV: 82.5 fL (ref 80.0–100.0)
Platelets: 257 10*3/uL (ref 150–400)
RBC: 4.41 MIL/uL (ref 4.22–5.81)
RDW: 16.4 % — ABNORMAL HIGH (ref 11.5–15.5)
WBC: 8.5 10*3/uL (ref 4.0–10.5)
nRBC: 0.2 % (ref 0.0–0.2)

## 2019-11-27 LAB — PROTIME-INR
INR: 1.3 — ABNORMAL HIGH (ref 0.8–1.2)
Prothrombin Time: 16.2 seconds — ABNORMAL HIGH (ref 11.4–15.2)

## 2019-11-27 MED ORDER — FUROSEMIDE 20 MG PO TABS: 10.0000 mg | ORAL_TABLET | Freq: Every day | ORAL | 0 refills | Status: DC | PRN

## 2019-11-27 MED ORDER — APIXABAN 5 MG PO TABS: 5.0000 mg | ORAL_TABLET | Freq: Two times a day (BID) | ORAL | 0 refills | Status: DC

## 2019-11-27 MED ORDER — AMIODARONE HCL 200 MG PO TABS: 200.0000 mg | ORAL_TABLET | Freq: Two times a day (BID) | ORAL | 0 refills | Status: DC

## 2019-11-27 MED ORDER — FERROUS SULFATE 325 (65 FE) MG PO TABS: 325.0000 mg | ORAL_TABLET | Freq: Every day | ORAL | 0 refills | Status: DC

## 2019-11-27 MED ORDER — CARVEDILOL 3.125 MG PO TABS: 3.1250 mg | ORAL_TABLET | Freq: Two times a day (BID) | ORAL | 0 refills | Status: DC

## 2019-11-27 MED ORDER — HYDROCORTISONE 1 % EX CREA
1.0000 "application " | TOPICAL_CREAM | Freq: Three times a day (TID) | CUTANEOUS | Status: DC | PRN
  Filled 2019-11-27: qty 28

## 2019-11-27 NOTE — Discharge Summary (Signed)
William [name]. [id number] DOB: 1955/10/02 DOA: 11/24/2019  PCP: William Hector, MD  Admit date: 11/24/2019 Discharge date: 11/27/2019  Admitted From: Home Disposition: Home  Recommendations for Outpatient Follow-up:  1. Follow up with PCP in 1 week 2. Please obtain BMP/CBC in one week 3. Dr. Ubaldo Glassing in 1 week Heart failure clinic in 1 week We will need to have sleep study done can be ordered by primary care Home Health: None   Discharge Condition:Stable CODE STATUS: Full Diet recommendation: Heart Healthy, less than 2 g sodium Brief/Interim Summary: William Sampsonis a 64 y.o.malewith medical history significant formorbidobesity, GERD, Diabetes mellitus and hypertension who presents to the ERfor evaluation of shortness of breath mostly with exertion associated with 2 pillow orthopnea,PND, malaiseand fatigue.Patient states that he received his [employer] 5 days ago and his symptomsstarted 2 days after he received the vaccine.He also complains of exertional chest pain butdenies having any nausea,vomiting,diaphoresis or palpitation.Cardiomyopathy with EF of less than 20%.  He was also found with a flutter with rapid ventricular response.  Was started on Cardizem initially and then switched to IV amiodarone.  He had cardioversion and switch from IV heparin to Eliquis.  He has remained in sinus rhythm.  He was started on Entresto and carvedilol however due to his low blood pressure Entresto was discontinued and will be revisited as outpatient.  Cardiology felt he was stable to be discharged home today.  Will need to follow-up with heart failure clinic within a week.  Discussed with patient risk and complications of anticoagulation including brain hemorrhage and GI bleed and he verbalizes an understanding.  A note was given for his job.  He will need a sleep study as outpatient.  He was instructed if he gains more than 3 pounds he should call heart  failure clinic about taking a diuretic.  Was recommended for him to lose weight and exercise as tolerated.  Discharge Diagnoses:  Principal Problem:   Acute on chronic combined systolic and diastolic CHF (congestive heart failure) (HCC) Active Problems:   Type 2 diabetes mellitus without complication (HCC)   GERD (gastroesophageal reflux disease)   Depression   Renal mass, right    Discharge Instructions  Discharge Instructions    AMB referral to CHF clinic   Complete by: As directed    Amb Referral to Cardiac Rehabilitation   Complete by: As directed    Diagnosis: Heart Failure (see criteria below if ordering Phase II)   Heart Failure Type: Chronic Systolic & Diastolic   After initial evaluation and assessments completed: Virtual Based Care may be provided alone or in conjunction with Phase 2 Cardiac Rehab based on patient barriers.: Yes   Call MD for:  difficulty breathing, headache or visual disturbances   Complete by: As directed    Diet - low sodium heart healthy   Complete by: As directed    Discharge instructions   Complete by: As directed    Weight daily, if weight increases by 3lbs call heart failure clinic before taking lasix Low sodium diet <2gm daily No NSAIDS or ibuprofen   Increase activity slowly   Complete by: As directed      Allergies as of 11/27/2019      Reactions   Morphine And Related Hives   Nsaids Other (See Comments)   Gastric bypass   Opium Hives   Opiate drip when had gastric bypass drug unknown (morphine drip)      Medication List  STOP taking these medications   Viagra 100 MG tablet Generic drug: sildenafil     TAKE these medications   amiodarone 200 MG tablet Commonly known as: PACERONE Take 1 tablet (200 mg total) by mouth 2 (two) times daily. Notes to patient: Tonight at 9P.    apixaban 5 MG Tabs tablet Commonly known as: ELIQUIS Take 1 tablet (5 mg total) by mouth 2 (two) times daily. Notes to patient: Tonight at [place].     B-12 2500 MCG Tabs Take 1,000 mcg by mouth daily. Notes to patient: Tomorrow at 9A.    carvedilol 3.125 MG tablet Commonly known as: COREG Take 1 tablet (3.125 mg total) by mouth 2 (two) times daily with a meal. Notes to patient: Today with dinner.    ferrous sulfate 325 (65 FE) MG tablet Take 1 tablet (325 mg total) by mouth daily. Start taking on: November 28, 2019 What changed:   medication strength  how much to take Notes to patient: Tomorrow at [place].    FLUoxetine 20 MG capsule Commonly known as: PROZAC Take 20 mg by mouth every evening. Notes to patient: Tonight at 6P.    furosemide 20 MG tablet Commonly known as: LASIX Take 0.5 tablets (10 mg total) by mouth daily as needed for fluid (SOB or weight is up by 3lbs).   Jardiance 25 MG Tabs tablet Generic drug: empagliflozin Take 25 mg by mouth daily. Notes to patient: Today when home.    metFORMIN 1000 MG tablet Commonly known as: GLUCOPHAGE Take 1,000 mg by mouth 2 (two) times daily with a meal. Notes to patient: Today with dinner.    multivitamin with minerals Tabs tablet Take 1 tablet by mouth daily. Notes to patient: Tomorrow at 9A.    pantoprazole 40 MG tablet Commonly known as: PROTONIX Take 40 mg by mouth every evening. Notes to patient: Tomorrow at [place].    pravastatin 40 MG tablet Commonly known as: PRAVACHOL Take 40 mg by mouth at bedtime. Notes to patient: Tonight at bedtime.       Follow-up Information    [facility] Follow up on 12/03/2019.   Specialty: Cardiology Why: at 9:00am. Enter through the [facility] entrance Contact information: [address]       William Spray, MD Follow up in 1 week(s).   Specialty: Cardiology Contact information: [place] [address] [phone]        William Hector, MD Follow up.   Specialty: Internal  Medicine Contact information: [address] [facility] [place] [address] [phone]          Allergies  Allergen Reactions  . Morphine And Related Hives  . Nsaids Other (See Comments)    Gastric bypass  . Opium Hives    Opiate drip when had gastric bypass drug unknown (morphine drip)    Consultations:  Cardiology   Procedures/Studies: DG Chest 2 View  Result Date: 11/25/2019 CLINICAL DATA:  Short of breath. EXAM: CHEST - 2 VIEW COMPARISON:  11/24/2019 FINDINGS: Mild cardiac enlargement. Small bilateral pleural effusions and pulmonary vascular congestion are again seen. No airspace consolidation. Visualized osseous structures unremarkable. IMPRESSION: 1. Mild CHF.  Unchanged. Electronically Signed   By: Kerby Moors M.D.   On: 11/25/2019 08:46   CT ANGIO CHEST PE W OR WO CONTRAST  Result Date: 11/24/2019 CLINICAL DATA:  Recent COVID vaccine. Labored breathing. Pleuritic chest  pain, shortness of breath. EXAM: CT ANGIOGRAPHY CHEST WITH CONTRAST TECHNIQUE: Multidetector CT imaging of the chest was performed using the standard protocol during bolus administration of intravenous contrast. Multiplanar CT image reconstructions and MIPs were obtained to evaluate the vascular anatomy. CONTRAST:  30mL OMNIPAQUE IOHEXOL 350 MG/ML SOLN COMPARISON:  None. FINDINGS: Cardiovascular: No filling defects in the pulmonary arteries to suggest pulmonary emboli. Heart is mildly enlarged. Coronary artery and aortic calcifications. No aneurysm. Mediastinum/Nodes: No mediastinal, hilar, or axillary adenopathy. Trachea and esophagus are unremarkable. Thyroid unremarkable. Lungs/Pleura: Moderate bilateral pleural effusions. Dependent and bibasilar ground-glass atelectasis. No confluent opacities otherwise. Upper Abdomen: Imaging into the upper abdomen shows no acute findings. Rim calcified mass off the upper pole of the right kidney measures 4.6 cm. Musculoskeletal: Chest wall soft  tissues are unremarkable. No acute bony abnormality. Review of the MIP images confirms the above findings. IMPRESSION: No evidence of pulmonary embolus. Moderate bilateral pleural effusions. Dependent and bibasilar atelectasis. 4.6 cm rim calcified mass off the upper pole of the right kidney. This could be further evaluated with elective renal ultrasound or abdominal CT/MRI. Coronary artery disease, aortic atherosclerosis. Electronically Signed   By: Rolm Baptise M.D.   On: 11/24/2019 14:13   US RENAL  Result Date: 11/24/2019 CLINICAL DATA:  Right renal mass on CT EXAM: RENAL / URINARY TRACT ULTRASOUND COMPLETE COMPARISON:  CT 11/24/2019 FINDINGS: Right Kidney: Renal measurements: 12.7 x 7.3 x 6.6 cm = volume: 319 mL. Cortical echogenicity is normal. No hydronephrosis. Cyst in the upper pole measuring 5.1 x 4.1 x 4.4 cm corresponding to the CT abnormality. Additional cyst in the lower pole measuring 3 x 2.6 x 2.6 cm. Left Kidney: Renal measurements: 11.4 x 6.7 x 6.2 cm = volume: 247 mL. Echogenicity within normal limits. No mass or hydronephrosis visualized. Bladder: Poorly visible.  Bladder is empty Other: None. IMPRESSION: 1. Right renal cysts. 2. The kidneys are otherwise normal. Electronically Signed   By: Donavan Foil M.D.   On: 11/24/2019 18:57   ECHOCARDIOGRAM COMPLETE  Result Date: 11/26/2019    ECHOCARDIOGRAM REPORT   Patient Name:   William [name]. Date of Exam: 11/26/2019 Medical Rec #:  [id number]        Height:       69.0 in Accession #:    [id number]       Weight:       348.2 lb Date of Birth:  [birth year]        BSA:          2.615 m Patient Age:    4 years         BP:           104/82 mmHg Patient Gender: M                HR:           130 bpm. Exam Location:  [facility] Procedure: 2D Echo, Cardiac Doppler and Color Doppler Indications:     Cardiomyopathy -unspecified 425.9  History:         Patient has no prior history of Echocardiogram examinations.                  Risk Factors:Diabetes.   Sonographer:     Sherrie Sport RDCS (AE) Referring Phys:  [place] Diagnosing Phys: Yolonda Kida MD  Sonographer Comments: No apical window. IMPRESSIONS  1. Left ventricular ejection fraction, by estimation, is <20%. The left ventricle has severely decreased  function. The left ventricle demonstrates global hypokinesis. The left ventricular internal cavity size was moderately to severely dilated. Left ventricular diastolic parameters are consistent with Grade I diastolic dysfunction (impaired relaxation).  2. Right ventricular systolic function is moderately reduced. The right ventricular size is mildly enlarged. There is normal pulmonary artery systolic pressure.  3. Left atrial size was mildly dilated.  4. Right atrial size was mildly dilated.  5. The mitral valve is grossly normal. Mild mitral valve regurgitation.  6. The aortic valve is grossly normal. Aortic valve regurgitation is not visualized. FINDINGS  Left Ventricle: Left ventricular ejection fraction, by estimation, is <20%. The left ventricle has severely decreased function. The left ventricle demonstrates global hypokinesis. The left ventricular internal cavity size was moderately to severely dilated. There is no left ventricular hypertrophy. Left ventricular diastolic parameters are consistent with Grade I diastolic dysfunction (impaired relaxation). Right Ventricle: The right ventricular size is mildly enlarged. No increase in right ventricular wall thickness. Right ventricular systolic function is moderately reduced. There is normal pulmonary artery systolic pressure. The tricuspid regurgitant velocity is 1.82 m/s, and with an assumed right atrial pressure of 10 mmHg, the estimated right ventricular systolic pressure is 123XX123 mmHg. Left Atrium: Left atrial size was mildly dilated. Right Atrium: Right atrial size was mildly dilated. Pericardium: There is no evidence of pericardial effusion. Mitral Valve: The mitral valve is grossly  normal. Mild mitral valve regurgitation. Tricuspid Valve: The tricuspid valve is normal in structure. Tricuspid valve regurgitation is mild. Aortic Valve: The aortic valve is grossly normal. Aortic valve regurgitation is not visualized. Pulmonic Valve: The pulmonic valve was grossly normal. Pulmonic valve regurgitation is not visualized. Aorta: The aortic root is normal in size and structure. IAS/Shunts: No atrial level shunt detected by color flow Doppler.  LEFT VENTRICLE PLAX 2D LVIDd:         5.88 cm LVIDs:         5.52 cm LV PW:         1.29 cm LV IVS:        1.20 cm LVOT diam:     2.10 cm LVOT Area:     3.46 cm  LEFT ATRIUM         Index LA diam:    4.80 cm 1.84 cm/m   AORTA Ao Root diam: 2.90 cm TRICUSPID VALVE TR Peak grad:   13.2 mmHg TR Vmax:        182.00 cm/s  SHUNTS Systemic Diam: 2.10 cm Yolonda Kida MD Electronically signed by Yolonda Kida MD Signature Date/Time: 11/26/2019/9:02:10 AM    Final        Subjective: Feeling much better.  Complaining of lower extremity edema but no shortness of breath. Discharge Exam: Vitals:   11/27/19 0540 11/27/19 0736  BP: 104/60 104/74  Pulse: 70 66  Resp:  17  Temp:  97.9 F (36.6 C)  SpO2:  100%   Vitals:   11/26/19 1945 11/27/19 0507 11/27/19 0540 11/27/19 0736  BP: (!) 86/54 (!) 84/56 104/60 104/74  Pulse: 75 66 70 66  Resp:  16  17  Temp:  97.7 F (36.5 C)  97.9 F (36.6 C)  TempSrc:  Oral  Oral  SpO2:  97%  100%  Weight:   (!) 158.4 kg   Height:        General: Pt is alert, awake, not in acute distress Cardiovascular: RRR, S1/S2 +, no rubs, no gallops Respiratory: CTA bilaterally, no wheezing, no rhonchi Abdominal: Soft,  NT, ND, bowel sounds + Extremities: Positive edema bilaterally pitting no cyanosis    The results of significant diagnostics from this hospitalization (including imaging, microbiology, ancillary and laboratory) are listed below for reference.     Microbiology: No results found for this or any  previous visit (from the past 240 hour(s)).   Labs: BNP (last 3 results) Recent Labs    11/24/19 1536 11/25/19 0634  BNP 837.0* [id number]*   Basic Metabolic Panel: Recent Labs  Lab 11/24/19 1536 11/25/19 0634 11/26/19 0118 11/27/19 0503  NA 138 138 136 138  K 4.2 3.7 3.7 3.6  CL 109 106 105 107  CO2 19* 22 21* 21*  GLUCOSE 193* 192* 190* 188*  BUN 19 22 30* 28*  CREATININE 0.84 0.84 1.05 0.84  CALCIUM 8.6* 8.2* 8.3* 8.3*   Liver Function Tests: Recent Labs  Lab 11/24/19 1536  AST 41  ALT 64*  ALKPHOS 75  BILITOT 0.8  PROT 6.8  ALBUMIN 3.8   No results for input(s): LIPASE, AMYLASE in the last 168 hours. No results for input(s): AMMONIA in the last 168 hours. CBC: Recent Labs  Lab 11/24/19 1536 11/25/19 0634 11/26/19 0118 11/27/19 0503  WBC 9.5 8.5 9.5 8.5  NEUTROABS 6.3  --   --   --   HGB 12.5* 11.3* 11.6* 11.4*  HCT 39.1 36.4* 36.5* 36.4*  MCV 81.3 82.0 81.3 82.5  PLT 297 272 277 257   Cardiac Enzymes: No results for input(s): CKTOTAL, CKMB, CKMBINDEX, TROPONINI in the last 168 hours. BNP: Invalid input(s): POCBNP CBG: Recent Labs  Lab 11/24/19 2113 11/26/19 1105  GLUCAP 222* 189*   D-Dimer No results for input(s): DDIMER in the last 72 hours. Hgb A1c No results for input(s): HGBA1C in the last 72 hours. Lipid Profile No results for input(s): CHOL, HDL, LDLCALC, TRIG, CHOLHDL, LDLDIRECT in the last 72 hours. Thyroid function studies No results for input(s): TSH, T4TOTAL, T3FREE, THYROIDAB in the last 72 hours.  Invalid input(s): FREET3 Anemia work up No results for input(s): VITAMINB12, FOLATE, FERRITIN, TIBC, IRON, RETICCTPCT in the last 72 hours. Urinalysis    Component Value Date/Time   COLORURINE YELLOW (A) 08/07/2016 1337   APPEARANCEUR CLEAR (A) 08/07/2016 1337   LABSPEC 1.020 08/07/2016 1337   PHURINE 5.0 08/07/2016 1337   GLUCOSEU NEGATIVE 08/07/2016 1337   HGBUR NEGATIVE 08/07/2016 1337   BILIRUBINUR NEGATIVE 08/07/2016 1337    KETONESUR NEGATIVE 08/07/2016 1337   PROTEINUR NEGATIVE 08/07/2016 1337   NITRITE NEGATIVE 08/07/2016 1337   LEUKOCYTESUR NEGATIVE 08/07/2016 1337   Sepsis Labs Invalid input(s): PROCALCITONIN,  WBC,  LACTICIDVEN Microbiology No results found for this or any previous visit (from the past 240 hour(s)). Acute on chronic combined systolic and diastolic dysfunction CHF He has elevated BNP levelsandCT scan shows bilateral pleural effusions Echo <20% switched Lasix 40 mg IV every 12 to po 10mg  prn Continue  Carvedilol Cards following Entresto discontinued due to hypotension  Elevated Troponin- per cards not NSTEMI. Demand ischemia 2/2 tachycardia and chf Cardiology following PRN Nitrates Echo <20% On beta blk  aflutter rvr.  Heparin gt d/c, started on Eliquis Started on  Coreg S/p CV today, in sr On IV amiodarone for the next 24 hours then for short course of amiodarone p.o.    Diabetes mellitus Place patient on Glu[place]ander Carb control   Hypertensive urgency- resolved Optimize blood pressure control Continue coreg and lasix as needed   Depression Continue Fluoxetine  Anxiety-while in hospital.  Will start prn ativan  Renal mass Noted on CTA of the chest involving the right kidney renal ultrasound revealed renal cyst.    Morbid Obesity (BMI 44.15) Will need to lose weight .  Time coordinating discharge: Over 30 minutes  SIGNED:   Nolberto Hanlon, MD  [facility] 11/27/2019, 1:31 PM Pager   If 7PM-7AM, please contact night-coverage [email or web address] Password [email or web address]

## 2019-11-27 NOTE — Plan of Care (Signed)
?  Problem: Education: ?Goal: Ability to demonstrate management of disease process will improve ?Outcome: Progressing ?Goal: Ability to verbalize understanding of medication therapies will improve ?Outcome: Progressing ?Goal: Individualized Educational Video(s) ?Outcome: Progressing ?  ?Problem: Activity: ?Goal: Capacity to carry out activities will improve ?Outcome: Progressing ?  ?Problem: Cardiac: ?Goal: Ability to achieve and maintain adequate cardiopulmonary perfusion will improve ?Outcome: Progressing ?  ?Problem: Education: ?Goal: Knowledge of General Education information will improve ?Description: Including pain rating scale, medication(s)/side effects and non-pharmacologic comfort measures ?Outcome: Progressing ?  ?Problem: Health Behavior/Discharge Planning: ?Goal: Ability to manage health-related needs will improve ?Outcome: Progressing ?  ?Problem: Clinical Measurements: ?Goal: Ability to maintain clinical measurements within normal limits will improve ?Outcome: Progressing ?Goal: Will remain free from infection ?Outcome: Progressing ?Goal: Diagnostic test results will improve ?Outcome: Progressing ?Goal: Respiratory complications will improve ?Outcome: Progressing ?Goal: Cardiovascular complication will be avoided ?Outcome: Progressing ?  ?Problem: Nutrition: ?Goal: Adequate nutrition will be maintained ?Outcome: Progressing ?  ?Problem: Coping: ?Goal: Level of anxiety will decrease ?Outcome: Progressing ?  ?Problem: Elimination: ?Goal: Will not experience complications related to bowel motility ?Outcome: Progressing ?Goal: Will not experience complications related to urinary retention ?Outcome: Progressing ?  ?Problem: Pain Managment: ?Goal: General experience of comfort will improve ?Outcome: Progressing ?  ?Problem: Safety: ?Goal: Ability to remain free from injury will improve ?Outcome: Progressing ?  ?Problem: Skin Integrity: ?Goal: Risk for impaired skin integrity will decrease ?Outcome:  Progressing ?  ?

## 2019-11-27 NOTE — Discharge Instructions (Signed)
Angina  Angina is extreme discomfort in the chest, neck, arm, jaw, or back. The discomfort is caused by a lack of blood in the middle layer of the heart wall (myocardium). There are four types of angina:  Stable angina. This is triggered by vigorous activity or exercise. It goes away when you rest or take angina medicine.  Unstable angina. This is a warning sign and can lead to a heart attack (acute coronary syndrome). This is a medical emergency. Symptoms come at rest and last a long time.  Microvascular angina. This affects the small coronary arteries. Symptoms include feeling tired and being short of breath.  Prinzmetal or variant angina. This is caused by a tightening (spasm) of the arteries that go to your heart. What are the causes? This condition is caused by atherosclerosis. This is the buildup of fat and cholesterol (plaque) in your arteries. The plaque may narrow or block the artery. Other causes of angina include:  Sudden tightening of the muscles of the arteries in the heart (coronary spasm).  Small artery disease (microvascular dysfunction).  Problems with any of your heart valves (heart valve disease).  A tear in an artery in your heart (coronary artery dissection).  Diseases of the heart muscle (cardiomyopathy), or other heart diseases. What increases the risk? You are more likely to develop this condition if you have:  High cholesterol.  High blood pressure (hypertension).  Diabetes.  A family history of heart disease.  An inactive (sedentary) lifestyle, or you do not exercise enough.  Depression.  Had radiation treatment to the left side of your chest. Other risk factors include:  Using tobacco.  Being obese.  Eating a diet high in saturated fats.  Being exposed to high stress or triggers of stress.  Using drugs, such as cocaine. Women have a greater risk for angina if:  They are older than 55.  They have gone through menopause (are  postmenopausal). What are the signs or symptoms? Common symptoms of this condition in both men and women may include:  Chest pain, which may: ? Feel like a crushing or squeezing in the chest, or like a tightness, pressure, fullness, or heaviness in the chest. ? Last for more than a few minutes at a time, or it may stop and come back (recur) over the course of a few minutes.  Pain in the neck, arm, jaw, or back.  Unexplained heartburn or indigestion.  Shortness of breath.  Nausea.  Sudden cold sweats. Women and people with diabetes may have unusual (atypical) symptoms, such as:  Fatigue.  Unexplained feelings of nervousness or anxiety.  Unexplained weakness.  Dizziness or fainting. How is this diagnosed? This condition may be diagnosed based on:  Your symptoms and medical history.  Electrocardiogram (ECG) to measure the electrical activity in your heart.  Blood tests.  Stress test to look for signs of blockage when your heart is stressed.  CT angiogram to examine your heart and the blood flow to it.  Coronary angiogram to check your coronary arteries for blockage. How is this treated? Angina may be treated with:  Medicines to: ? Prevent blood clots and heart attack. ? Relax blood vessels and improve blood flow to the heart (nitrates). ? Reduce blood pressure, improve the pumping action of the heart, and relax blood vessels that are spasming. ? Reduce cholesterol and help treat atherosclerosis.  A procedure to widen a narrowed or blocked coronary artery (angioplasty). A mesh tube may be placed in a coronary artery to   keep it open (coronary stenting).  Surgery to allow blood to go around a blocked artery (coronary artery bypass surgery). Follow these instructions at home: Medicines  Take over-the-counter and prescription medicines only as told by your health care provider.  Do not take the following medicines unless your health care provider approves: ? NSAIDs,  such as ibuprofen or naproxen. ? Vitamin supplements that contain vitamin A, vitamin E, or both. ? Hormone replacement therapy that contains estrogen with or without progestin. Eating and drinking   Eat a heart-healthy diet. This includes plenty of fresh fruits and vegetables, whole grains, low-fat (lean) protein, and low-fat dairy products.  Follow instructions from your health care provider about eating or drinking restrictions. Activity  Follow an exercise program approved by your health care provider.  Consider joining a cardiac rehabilitation program.  Take a break when you feel fatigued. Plan rest periods in your daily activities. Lifestyle   Do not use any products that contain nicotine or tobacco, such as cigarettes, e-cigarettes, and chewing tobacco. If you need help quitting, ask your health care provider.  If your health care provider says you can drink alcohol: ? Limit how much you use to:  0-1 drink a day for nonpregnant women.  0-2 drinks a day for men. ? Be aware of how much alcohol is in your drink. In the [place] one drink equals one 12 oz bottle of beer (355 mL), one 5 oz glass of wine (148 mL), or one 1 oz glass of hard liquor (44 mL). General instructions  Maintain a healthy weight.  Learn to manage stress.  Keep your vaccinations up to date. Get the flu (influenza) vaccine every year.  Talk to your health care provider if you feel depressed. Take a depression screening test to see if you are at risk for depression.  Work with your health care provider to manage other health conditions, such as hypertension or diabetes.  Keep all follow-up visits as told by your health care provider. This is important. Get help right away if:  You have pain in your chest, neck, arm, jaw, or back, and the pain: ? Lasts more than a few minutes. ? Is recurring. ? Is not relieved by taking medicines under the tongue (sublingual nitroglycerin). ? Increases in intensity or  frequency.  You have a lot of sweating without cause.  You have unexplained: ? Heartburn or indigestion. ? Shortness of breath or difficulty breathing. ? Nausea or vomiting. ? Fatigue. ? Feelings of nervousness or anxiety. ? Weakness.  You have sudden light-headedness or dizziness.  You faint. These symptoms may represent a serious problem that is an emergency. Do not wait to see if the symptoms will go away. Get medical help right away. Call your local emergency services (911 in the [place] Do not drive yourself to the hospital. Summary  Angina is extreme discomfort in the chest, neck, arm, jaw, or back that is caused by a lack of blood in the heart wall.  There are many symptoms of angina. They include chest pain, unexplained heartburn or indigestion, sudden cold sweats, and fatigue.  Angina may be treated with behavioral changes, medicine, or surgery.  Symptoms of angina may represent an emergency. Get medical help right away. Call your local emergency services (911 in the [place] Do not drive yourself to the hospital. This information is not intended to replace advice given to you by your health care provider. Make sure you discuss any questions you have with your health care provider.   Document Revised: 04/13/2018 Document Reviewed: 04/13/2018 Elsevier Patient Education  2020 [employer]  

## 2019-11-27 NOTE — Progress Notes (Signed)
[facility] Cardiology    SUBJECTIVE: Patient feels somewhat better today still has episodes of hypotension he was found to have a severe dilated cardiomyopathy ejection fraction less than 25%.  No chest pain status post cardioversion for atrial fibrillation.  Patient still has some hypotension improved shortness of breath but still has significant leg edema.  Patient has been advised for discharge today but recommend early follow-up for further assessment treatment treatment for heart failure   Vitals:   11/26/19 1945 11/27/19 0507 11/27/19 0540 11/27/19 0736  BP: (!) 86/54 (!) 84/56 104/60 104/74  Pulse: 75 66 70 66  Resp:  16  17  Temp:  97.7 F (36.5 C)  97.9 F (36.6 C)  TempSrc:  Oral  Oral  SpO2:  97%  100%  Weight:   (!) 158.4 kg   Height:         Intake/Output Summary (Last 24 hours) at 11/27/2019 1150 Last data filed at 11/26/2019 1232 Gross per 24 hour  Intake 580 ml  Output --  Net 580 ml      PHYSICAL EXAM  General: Well developed, well nourished, in no acute distress HEENT:  Normocephalic and atramatic Neck:  No JVD.  Lungs: Clear bilaterally to auscultation and percussion. Heart: HRRR . Normal S1 and S2 with S3 gallops or murmurs.  Abdomen: Bowel sounds are positive, abdomen soft and non-tender  Msk:  Back normal, normal gait. Normal strength and tone for age. Extremities: No clubbing, cyanosis or 3+edema.   Neuro: Alert and oriented X 3. Psych:  Good affect, responds appropriately   LABS: Basic Metabolic Panel: Recent Labs    11/26/19 0118 11/27/19 0503  NA 136 138  K 3.7 3.6  CL 105 107  CO2 21* 21*  GLUCOSE 190* 188*  BUN 30* 28*  CREATININE 1.05 0.84  CALCIUM 8.3* 8.3*   Liver Function Tests: Recent Labs    11/24/19 1536  AST 41  ALT 64*  ALKPHOS 75  BILITOT 0.8  PROT 6.8  ALBUMIN 3.8   No results for input(s): LIPASE, AMYLASE in the last 72 hours. CBC: Recent Labs    11/24/19 1536 11/25/19 0634 11/26/19 0118 11/27/19 0503  WBC  9.5   < > 9.5 8.5  NEUTROABS 6.3  --   --   --   HGB 12.5*   < > 11.6* 11.4*  HCT 39.1   < > 36.5* 36.4*  MCV 81.3   < > 81.3 82.5  PLT 297   < > 277 257   < > = values in this interval not displayed.   Cardiac Enzymes: No results for input(s): CKTOTAL, CKMB, CKMBINDEX, TROPONINI in the last 72 hours. BNP: Invalid input(s): POCBNP D-Dimer: No results for input(s): DDIMER in the last 72 hours. Hemoglobin A1C: No results for input(s): HGBA1C in the last 72 hours. Fasting Lipid Panel: No results for input(s): CHOL, HDL, LDLCALC, TRIG, CHOLHDL, LDLDIRECT in the last 72 hours. Thyroid Function Tests: No results for input(s): TSH, T4TOTAL, T3FREE, THYROIDAB in the last 72 hours.  Invalid input(s): FREET3 Anemia Panel: No results for input(s): VITAMINB12, FOLATE, FERRITIN, TIBC, IRON, RETICCTPCT in the last 72 hours.  ECHOCARDIOGRAM COMPLETE  Result Date: 11/26/2019    ECHOCARDIOGRAM REPORT   Patient Name:   William [name]. Date of Exam: 11/26/2019 Medical Rec #:  [id number]        Height:       69.0 in Accession #:    [id number]  Weight:       348.2 lb Date of Birth:  Jun 09, 1956        BSA:          2.615 m Patient Age:    64 years         BP:           104/82 mmHg Patient Gender: M                HR:           130 bpm. Exam Location:  [facility] Procedure: 2D Echo, Cardiac Doppler and Color Doppler Indications:     Cardiomyopathy -unspecified 425.9  History:         Patient has no prior history of Echocardiogram examinations.                  Risk Factors:Diabetes.  Sonographer:     Sherrie Sport RDCS (AE) Referring Phys:  [place] Diagnosing Phys: Yolonda Kida MD  Sonographer Comments: No apical window. IMPRESSIONS  1. Left ventricular ejection fraction, by estimation, is <20%. The left ventricle has severely decreased function. The left ventricle demonstrates global hypokinesis. The left ventricular internal cavity size was moderately to severely dilated. Left ventricular  diastolic parameters are consistent with Grade I diastolic dysfunction (impaired relaxation).  2. Right ventricular systolic function is moderately reduced. The right ventricular size is mildly enlarged. There is normal pulmonary artery systolic pressure.  3. Left atrial size was mildly dilated.  4. Right atrial size was mildly dilated.  5. The mitral valve is grossly normal. Mild mitral valve regurgitation.  6. The aortic valve is grossly normal. Aortic valve regurgitation is not visualized. FINDINGS  Left Ventricle: Left ventricular ejection fraction, by estimation, is <20%. The left ventricle has severely decreased function. The left ventricle demonstrates global hypokinesis. The left ventricular internal cavity size was moderately to severely dilated. There is no left ventricular hypertrophy. Left ventricular diastolic parameters are consistent with Grade I diastolic dysfunction (impaired relaxation). Right Ventricle: The right ventricular size is mildly enlarged. No increase in right ventricular wall thickness. Right ventricular systolic function is moderately reduced. There is normal pulmonary artery systolic pressure. The tricuspid regurgitant velocity is 1.82 m/s, and with an assumed right atrial pressure of 10 mmHg, the estimated right ventricular systolic pressure is 123XX123 mmHg. Left Atrium: Left atrial size was mildly dilated. Right Atrium: Right atrial size was mildly dilated. Pericardium: There is no evidence of pericardial effusion. Mitral Valve: The mitral valve is grossly normal. Mild mitral valve regurgitation. Tricuspid Valve: The tricuspid valve is normal in structure. Tricuspid valve regurgitation is mild. Aortic Valve: The aortic valve is grossly normal. Aortic valve regurgitation is not visualized. Pulmonic Valve: The pulmonic valve was grossly normal. Pulmonic valve regurgitation is not visualized. Aorta: The aortic root is normal in size and structure. IAS/Shunts: No atrial level shunt  detected by color flow Doppler.  LEFT VENTRICLE PLAX 2D LVIDd:         5.88 cm LVIDs:         5.52 cm LV PW:         1.29 cm LV IVS:        1.20 cm LVOT diam:     2.10 cm LVOT Area:     3.46 cm  LEFT ATRIUM         Index LA diam:    4.80 cm 1.84 cm/m   AORTA Ao Root diam: 2.90 cm TRICUSPID VALVE TR  Peak grad:   13.2 mmHg TR Vmax:        182.00 cm/s  SHUNTS Systemic Diam: 2.10 cm Taishaun Levels D Carnell Casamento MD Electronically signed by Yolonda Kida MD Signature Date/Time: 11/26/2019/9:02:10 AM    Final      Echo dilated severely depressed left ventricular function less than 25% consistent with a dilated cardiomyopathy unclear etiology  TELEMETRY: Normal sinus rhythm left bundle branch block nonspecific T2 changes:  ASSESSMENT AND PLAN:  Principal Problem:   Acute on chronic combined systolic and diastolic CHF (congestive heart failure) (HCC) Active Problems:   Type 2 diabetes mellitus without complication (HCC)   GERD (gastroesophageal reflux disease)   Depression   Renal mass, right Dilated cardiomyopathy Atrial fibrillation atrial flutter Hypotension Obesity . Plan Patient should be stable enough for discharge today Recommend amiodarone 200 mg once a day Eliquis 5 mg twice a day for anticoagulation Recommend heart failure therapy and further evaluation of cardiomyopathy We will try to reinstitute low-dose Entresto and Coreg as an outpatient we will limited because of hypotension at this point Will prescribe Lasix therapy for leg edema as tolerated Patient should be referred to heart failure clinic Consider AICD if heart function does not improve Recommend functional study or cardiac cath for evaluation of cardiomyopathy to rule out an ischemic component Recommend weight loss exercise portion control Continue diabetes management At some point patient will probably need a sleep study to rule out obstructive sleep apnea Consider evaluation for CRT with underlying bundle branch block  cardiomyopathy with heart failure Close follow-up as an outpatient advised patient to come in and be seen Tuesday morning either with cardiology with Dr. Cristopher Estimable, MD 11/27/2019 11:50 AM

## 2019-11-30 ENCOUNTER — Telehealth: Payer: Self-pay | Admitting: Family

## 2019-11-30 NOTE — Telephone Encounter (Signed)
LVM on 3/22 and 3/23 to confirm new patient chf clinic appointment we have scheduled for 3/26 and to see how he is doing since he was discharged from the hospital. Asked patient to call back to confirm when able.   Alyse Low, [place]

## 2019-12-03 ENCOUNTER — Ambulatory Visit: Payer: 59 | Admitting: Family

## 2019-12-15 NOTE — Progress Notes (Signed)
Patient ID: William Niece., male    DOB: [birth year], 65 y.o.   MRN: [id number]  HPI  William [name] is a 64 y/o male with a history of DM, HTN, GERD, PUD, anxiety, anemia, gastric bypass and heart failure.   Echo report from 11/26/19 reviewed and showed an EF of <20% along with William.   Admitted 11/24/19 due to acute on chronic HF along with AF with RVR. Cardiology consult obtained. Initially needed IV amiodarone. William [name] was stopped due to hypotension. Was cardioverted. Discharged after 3 days.   He presents today for his initial visit with a chief complaint of a productive cough. He describes this as having been present since his received the Botines vaccine a few days prior to recent hospitalization. He has associated pedal edema but says that it's very mild and "nothing like it was in the hospital or right after discharge". He denies any difficulty sleeping, dizziness, abdominal distention, palpitations, chest pain, shortness of breath, fatigue or weight gain.   Has been taking 40mg  of furosemide daily for a couple of weeks (per PCP's instructions) and has noticed a marked improvement in his weight and swelling (~ 50 pounds decrease since hospital discharge)  Past Medical History:  Diagnosis Date  . Anxiety   . 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 CHF (congestive heart failure) ([place])   . Diabetes mellitus without complication (William [name])   . GERD (gastroesophageal reflux disease)   . Hypertension   . Iron deficiency anemia   . Obesity   . Osteoarthritis of hip    right  . Peptic ulcer disease    Past Surgical History:  Procedure Laterality Date  . CARDIOVERSION N/A 11/26/2019   Procedure: CARDIOVERSION;  Surgeon: William Spray, MD;  Location: [facility] ORS;  Service: Cardiovascular;  Laterality: N/A;  . COLONOSCOPY    . ESOPHAGOGASTRODUODENOSCOPY    . GASTRIC BYPASS     2009  . NASAL SINUS SURGERY    . TOTAL HIP ARTHROPLASTY Right 08/20/2016   Procedure: TOTAL HIP  ARTHROPLASTY ANTERIOR APPROACH;  Surgeon: Hessie Knows, MD;  Location: [facility] ORS;  Service: Orthopedics;  Laterality: Right;   Family History  Problem Relation Age of Onset  . Dementia Mother   . Diabetes Father    Social History   Tobacco Use  . Smoking status: Never Smoker  . Smokeless tobacco: Never Used  Substance Use Topics  . Alcohol use: No   Allergies  Allergen Reactions  . Morphine And Related Hives  . Nsaids Other (See Comments)    Gastric bypass  . Opium Hives    Opiate drip when had gastric bypass drug unknown (morphine drip)   Prior to Admission medications   Medication Sig Start Date End Date Taking? Authorizing Provider  amiodarone (PACERONE) 200 MG tablet Take 1 tablet (200 mg total) by mouth 2 (two) times daily. Patient taking differently: Take 200 mg by mouth daily.  11/27/19  Yes William Hanlon, MD  apixaban (ELIQUIS) 5 MG TABS tablet Take 1 tablet (5 mg total) by mouth 2 (two) times daily. 11/27/19  Yes William Hanlon, MD  carvedilol (COREG) 3.125 MG tablet Take 1 tablet (3.125 mg total) by mouth 2 (two) times daily with a meal. 11/27/19  Yes William Hanlon, MD  Cyanocobalamin (B-12) 2500 MCG TABS Take 1,000 mcg by mouth daily.    Yes [provider]  ferrous sulfate 325 (65 FE) MG tablet Take 1 tablet (325 mg total)  by mouth daily. 11/28/19  Yes William Hanlon, MD  FLUoxetine (PROZAC) 20 MG capsule Take 20 mg by mouth every evening.  07/20/16  Yes [provider]  furosemide (LASIX) 20 MG tablet Take 0.5 tablets (10 mg total) by mouth daily as needed for fluid (SOB or weight is up by 3lbs). Patient taking differently: Take 40 mg by mouth daily.  11/27/19  Yes William Hanlon, MD  JARDIANCE 25 MG TABS tablet Take 25 mg by mouth daily. 11/02/19  Yes [provider]  metFORMIN (GLUCOPHAGE) 1000 MG tablet Take 1,000 mg by mouth 2 (two) times daily with a meal.    Yes [provider]  Multiple Vitamin (MULTIVITAMIN WITH MINERALS) TABS tablet Take  1 tablet by mouth daily.   Yes [provider]  pantoprazole (PROTONIX) 40 MG tablet Take 40 mg by mouth every evening.    Yes [provider]  pravastatin (PRAVACHOL) 40 MG tablet Take 40 mg by mouth at bedtime.  09/22/19  Yes [provider]    Review of Systems  Constitutional: Negative for appetite change and fatigue.  HENT: Negative for congestion, postnasal drip and sore throat.   Eyes: Negative.   Respiratory: Positive for cough. Negative for chest tightness, shortness of breath and wheezing.   Cardiovascular: Positive for leg swelling (very little). Negative for chest pain and palpitations.  Gastrointestinal: Negative for abdominal distention and abdominal pain.  Endocrine: Negative.   Genitourinary: Negative.   Musculoskeletal: Negative for back pain and neck pain.  Skin: Negative.   Allergic/Immunologic: Negative.   Neurological: Negative for dizziness and light-headedness.  Hematological: Negative for adenopathy. Does not bruise/bleed easily.  Psychiatric/Behavioral: Negative for dysphoric mood and sleep disturbance (sleeping on 1 pillow). The patient is nervous/anxious.     Vitals:   12/17/19 0950  BP: (!) 135/92  Pulse: 79  Resp: 18  SpO2: 99%  Weight: 290 lb 8 oz (131.8 kg)  Height: 5\' 9"  (1.753 m)   Wt Readings from Last 3 Encounters:  12/17/19 290 lb 8 oz (131.8 kg)  11/27/19 (!) 349 lb 4.8 oz (158.4 kg)  08/26/16 272 lb (123.4 kg)   Lab Results  Component Value Date   CREATININE 0.84 11/27/2019   CREATININE 1.05 11/26/2019   CREATININE 0.84 11/25/2019     Physical Exam Vitals and nursing note reviewed.  Constitutional:      Appearance: Normal appearance.  HENT:     Head: Normocephalic and atraumatic.  Cardiovascular:     Rate and Rhythm: Normal rate and regular rhythm.  Pulmonary:     Effort: Pulmonary effort is normal. No respiratory distress.     Breath sounds: No wheezing or rales.  Abdominal:     General: Abdomen  is flat. There is no distension.     Palpations: Abdomen is soft.  Musculoskeletal:        General: No tenderness.     Cervical back: Normal range of motion and neck supple.     Right lower leg: Edema (trace pitting) present.     Left lower leg: Edema (trace pitting) present.  Skin:    General: Skin is warm and dry.  Neurological:     General: No focal deficit present.     Mental Status: He is alert and oriented to person, place, and time.  Psychiatric:        Mood and Affect: Mood is anxious.        Behavior: Behavior normal.        Thought  Content: Thought content normal.      Assessment & Plan:  1: Chronic heart failure with reduced ejection fraction- - NYHA I - euvolemic today - weighing daily and reports 50 pound weight loss since being discharged and most in the last couple of weeks with diuretic increase; instructed to call for an overnight weight gain of >2 pounds or a weekly weight gain of >5 pounds - not adding salt and has been trying to keep daily sodium intake to 2000mg / day; written dietary information and low sodium cookbook were given to him - discussed using entresto but patient is hesitant because while admitted, his BP dropped when entresto was tried; he is willing to check his BP a few times/ week for the next couple of weeks and is willing to discuss using entresto at his next visit - saw cardiology William [name]) 02/20/18 - will check BMP/ Magnesium levels today since he's been taking higher dose of diuretic for the last couple of weeks - BNP 11/25/19 was 806.0 - PharmD reconciled medications w/ patient  2: HTN- - BP looks ok today; he is going to get a BP cuff and start checking it and recording it; he will bring BP log to next appointment - had telemedicine with PCP William [name]) 11/19/19 - BMP 11/27/19 reviewed and showed sodium 138, potassium 3.6, creatinine 0.84 and GFR >60  3: DM- - A1c 11/12/19 was 8.2% - glucose in clinic today was 159 - had gastric bypass in  2009 when he weighed in the 500's   Patient did not bring his medications nor a list. Each medication was verbally reviewed with the patient and he was encouraged to bring the bottles to every visit to confirm accuracy of list.   Return in 2 weeks or sooner for any questions/problems before then.

## 2019-12-17 ENCOUNTER — Ambulatory Visit: Payer: Managed Care, Other (non HMO) | Attending: Family | Admitting: Family

## 2019-12-17 ENCOUNTER — Encounter: Payer: Self-pay | Admitting: Family

## 2019-12-17 ENCOUNTER — Other Ambulatory Visit: Payer: Self-pay

## 2019-12-17 VITALS — BP 135/92 | HR 79 | Resp 18 | Ht 69.0 in | Wt 290.5 lb

## 2019-12-17 DIAGNOSIS — I5022 Chronic systolic (congestive) heart failure: Secondary | ICD-10-CM | POA: Diagnosis not present

## 2019-12-17 DIAGNOSIS — Z9884 Bariatric surgery status: Secondary | ICD-10-CM | POA: Insufficient documentation

## 2019-12-17 DIAGNOSIS — Z885 Allergy status to narcotic agent status: Secondary | ICD-10-CM | POA: Insufficient documentation

## 2019-12-17 DIAGNOSIS — Z79899 Other long term (current) drug therapy: Secondary | ICD-10-CM | POA: Insufficient documentation

## 2019-12-17 DIAGNOSIS — E119 Type 2 diabetes mellitus without complications: Secondary | ICD-10-CM | POA: Diagnosis not present

## 2019-12-17 DIAGNOSIS — Z7984 Long term (current) use of oral hypoglycemic drugs: Secondary | ICD-10-CM | POA: Diagnosis not present

## 2019-12-17 DIAGNOSIS — I5043 Acute on chronic combined systolic (congestive) and diastolic (congestive) heart failure: Secondary | ICD-10-CM

## 2019-12-17 DIAGNOSIS — Z7901 Long term (current) use of anticoagulants: Secondary | ICD-10-CM | POA: Insufficient documentation

## 2019-12-17 DIAGNOSIS — I10 Essential (primary) hypertension: Secondary | ICD-10-CM

## 2019-12-17 DIAGNOSIS — I110 Hypertensive heart disease with heart failure: Secondary | ICD-10-CM | POA: Insufficient documentation

## 2019-12-17 LAB — BASIC METABOLIC PANEL
Anion gap: 10 (ref 5–15)
BUN: 20 mg/dL (ref 8–23)
CO2: 26 mmol/L (ref 22–32)
Calcium: 8.8 mg/dL — ABNORMAL LOW (ref 8.9–10.3)
Chloride: 103 mmol/L (ref 98–111)
Creatinine, Ser: 1.04 mg/dL (ref 0.61–1.24)
GFR calc Af Amer: >60 mL/min (ref >60)
GFR calc non Af Amer: >60 mL/min (ref >60)
Glucose, Bld: 133 mg/dL — ABNORMAL HIGH (ref 70–99)
Potassium: 4.5 mmol/L (ref 3.5–5.1)
Sodium: 139 mmol/L (ref 135–145)

## 2019-12-17 LAB — MAGNESIUM: Magnesium: 2.3 mg/dL (ref 1.7–2.4)

## 2019-12-17 LAB — GLUCOSE, CAPILLARY: Glucose-Capillary: 159 mg/dL — ABNORMAL HIGH (ref 70–99)

## 2019-12-17 NOTE — Patient Instructions (Addendum)
Continue weighing daily and call for an overnight weight gain of > 2 pounds or a weekly weight gain of >5 pounds.    Begin checking your blood pressure at home every few days and bring BP readings to your next appointment.

## 2019-12-17 NOTE — Progress Notes (Signed)
[facility] - PHARMACIST COUNSELING NOTE  ADHERENCE ASSESSMENT  Adherence strategy: takes his medications with breakfast and with dinner.    Do you ever forget to take your medication? [] Yes (1) [x] No (0)  Do you ever skip doses due to side effects? [] Yes (1) [x] No (0)  Do you have trouble affording your medicines? [] Yes (1) [x] No (0)  Are you ever unable to pick up your medication due to transportation difficulties? [] Yes (1) [x] No (0)  Do you ever stop taking your medications because you don't believe they are helping? [] Yes (1) [x] No (0)  Total score _0______     Guideline-Directed Medical Therapy/Evidence Based Medicine  ACE/ARB/ARNI: None Beta Blocker: coreg 3.125 mg BID Aldosterone Antagonist: None Diuretic: Lasix 40 mg daily    SUBJECTIVE  HPI: 64 yr old male presents to the clinic for heart failure management. medical history significant formorbidobesity, GERD, Diabetes mellitus and hypertension who presents to the ERfor evaluation of shortness of breath mostly with exertion associated with 2 pillow orthopnea,PND, malaiseand fatigue. ptient states he received the [employer] vaccine and thinks heart failure occured due to the vaccine. No new complaints and state he is feeling great and more energized. Pt ordered food from [employer] - low sodium diet.   Past Medical History:  Diagnosis Date  . Anxiety   . 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 CHF (congestive heart failure) ([place])   . Diabetes mellitus without complication ([place])   . GERD (gastroesophageal reflux disease)   . Iron deficiency anemia   . Obesity   . Osteoarthritis of hip    right  . Peptic ulcer disease         OBJECTIVE   Vital signs: HR 79, BP 135/92, weight (pounds) 290 ECHO: Date 3/19, EF < 20,   BMP Latest Ref Rng & Units 11/27/2019 11/26/2019 11/25/2019  Glucose 70 - 99 mg/dL 188(H) 190(H) 192(H)  BUN 8 - 23 mg/dL 28(H) 30(H) 22  Creatinine 0.61 -  1.24 mg/dL 0.84 1.05 0.84  Sodium 135 - 145 mmol/L 138 136 138  Potassium 3.5 - 5.1 mmol/L 3.6 3.7 3.7  Chloride 98 - 111 mmol/L 107 105 106  CO2 22 - 32 mmol/L 21(L) 21(L) 22  Calcium 8.9 - 10.3 mg/dL 8.3(L) 8.3(L) 8.2(L)    ASSESSMENT Patient is doing well. Lasix was increased from 20 mg to 40 mg and pt has lost a good amount of weight. Patient is not on a ACEi/ARB/ARNI. Blood pressure is reasonable. Last labs were taken 3/20. Pt understand to weight himself daily and to call the clinic if weight gain > 3 lb in one night, and to get a blood pressure machine to monitor blood pressure. Patient was educated on all of his medications. Amiodarone was decreased from 200 mg BID to 200 mg daily.    PLAN Assess patient to initiate ACEI/ARB/ARNI. May need to have labs drawn. Patient is on amiodarone, we should maintain a K+ > 4 and Mg > 2.     Time spent: 30 minutes  Oswald Hillock, Pharm.D, BCPS Clinical Pharmacist 12/17/2019 10:44 AM    Current Outpatient Medications:  .  amiodarone (PACERONE) 200 MG tablet, Take 1 tablet (200 mg total) by mouth 2 (two) times daily. (Patient taking differently: Take 200 mg by mouth daily. ), Disp: 30 tablet, Rfl: 0 .  apixaban (ELIQUIS) 5 MG TABS tablet, Take 1 tablet (5 mg total) by mouth 2 (two) times daily., Disp:  60 tablet, Rfl: 0 .  carvedilol (COREG) 3.125 MG tablet, Take 1 tablet (3.125 mg total) by mouth 2 (two) times daily with a meal., Disp: 60 tablet, Rfl: 0 .  Cyanocobalamin (B-12) 2500 MCG TABS, Take 1,000 mcg by mouth daily. , Disp: , Rfl:  .  ferrous sulfate 325 (65 FE) MG tablet, Take 1 tablet (325 mg total) by mouth daily., Disp: 60 tablet, Rfl: 0 .  FLUoxetine (PROZAC) 20 MG capsule, Take 20 mg by mouth every evening. , Disp: , Rfl: 3 .  furosemide (LASIX) 20 MG tablet, Take 0.5 tablets (10 mg total) by mouth daily as needed for fluid (SOB or weight is up by 3lbs). (Patient taking differently: Take 40 mg by mouth daily. ), Disp: 30 tablet,  Rfl: 0 .  JARDIANCE 25 MG TABS tablet, Take 25 mg by mouth daily., Disp: , Rfl:  .  metFORMIN (GLUCOPHAGE) 1000 MG tablet, Take 1,000 mg by mouth 2 (two) times daily with a meal. , Disp: , Rfl:  .  Multiple Vitamin (MULTIVITAMIN WITH MINERALS) TABS tablet, Take 1 tablet by mouth daily., Disp: , Rfl:  .  pantoprazole (PROTONIX) 40 MG tablet, Take 40 mg by mouth every evening. , Disp: , Rfl:  .  pravastatin (PRAVACHOL) 40 MG tablet, Take 40 mg by mouth at bedtime. , Disp: , Rfl:    COUNSELING POINTS/CLINICAL PEARLS Carvedilol (Goal: weight less than 85 kg is 25 mg BID, weight greater than 85 kg is 50 mg BID)  Patient should avoid activities requiring coordination until drug  effects are realized, as drug may cause dizziness.  This drug may cause diarrhea, nausea, vomiting, arthralgia, back pain, myalgia, headache, vision disorder, erectile dysfunction, reduced libido, or fatigue.  Instruct patient to report signs/symptoms of adverse cardiovascular effects such as hypotension (especially in elderly patients), arrhythmias, syncope, palpitations, angina, or edema.  Drug may mask symptoms of hypoglycemia. Advise diabetic patients to carefully monitor blood sugar levels.  Patient should take drug with food.  Advise patient against sudden discontinuation of drug. Furosemide  Drug causes sun-sensitivity. Advise patient to use sunscreen and  avoid tanning beds. Patient should avoid activities requiring coordination until drug effects are realized, as drug may cause dizziness, vertigo, or blurred vision. This drug may cause hyperglycemia, hyperuricemia, constipation, diarrhea, loss of appetite, nausea, vomiting, purpuric disorder, cramps, spasticity, asthenia, headache, paresthesia, or scaling eczema. Instruct patient to report unusual bleeding/bruising or signs/symptoms of hypotension, infection, pancreatitis, or ototoxicity (tinnitus, hearing impairment). Advise patient to report signs/symptoms of a  severe skin reactions (flu-like symptoms, spreading red rash, or skin/mucous membrane blistering) or erythema multiforme. Instruct patient to eat high-potassium foods during drug therapy, as  directed by healthcare professional.  Patient should not drink alcohol while taking this drug.   DRUGS TO AVOID IN HEART FAILURE  Drug or Class Mechanism  Analgesics . NSAIDs . COX-2 inhibitors . Glucocorticoids  Sodium and water retention, increased systemic vascular resistance, decreased response to diuretics   Diabetes Medications . Metformin . Thiazolidinediones o Rosiglitazone (Avandia) o Pioglitazone (Actos) . DPP4 Inhibitors o Saxagliptin (Onglyza) o Sitagliptin (Januvia)   Lactic acidosis Possible calcium channel blockade   Unknown  Antiarrhythmics . Class I  o Flecainide o Disopyramide . Class III o Sotalol . Other o Dronedarone  Negative inotrope, proarrhythmic   Proarrhythmic, beta blockade  Negative inotrope  Antihypertensives . Alpha Blockers o Doxazosin . Calcium Channel Blockers o Diltiazem o Verapamil o Nifedipine . Central Alpha Adrenergics o Moxonidine . Peripheral Vasodilators o  Minoxidil  Increases renin and aldosterone  Negative inotrope    Possible sympathetic withdrawal  Unknown  Anti-infective . Itraconazole . Amphotericin B  Negative inotrope Unknown  Hematologic . Anagrelide . Cilostazol   Possible inhibition of PD IV Inhibition of PD III causing arrhythmias  Neurologic/Psychiatric . Stimulants . Anti-Seizure Drugs o Carbamazepine o Pregabalin . Antidepressants o Tricyclics o Citalopram . Parkinsons o Bromocriptine o Pergolide o Pramipexole . Antipsychotics o Clozapine . Antimigraine o Ergotamine o Methysergide . Appetite suppressants . Bipolar o Lithium  Peripheral alpha and beta agonist activity  Negative inotrope and chronotrope Calcium channel blockade  Negative inotrope, proarrhythmic Dose-dependent QT  prolongation  Excessive serotonin activity/valvular damage Excessive serotonin activity/valvular damage Unknown  IgE mediated hypersensitivy, calcium channel blockade  Excessive serotonin activity/valvular damage Excessive serotonin activity/valvular damage Valvular damage  Direct myofibrillar degeneration, adrenergic stimulation  Antimalarials . Chloroquine . Hydroxychloroquine Intracellular inhibition of lysosomal enzymes  Urologic Agents . Alpha Blockers o Doxazosin o Prazosin o Tamsulosin o Terazosin  Increased renin and aldosterone  Adapted from Page RL, et al. "Drugs That May Cause or Exacerbate Heart Failure: A Scientific Statement from the [employer]." Circulation 2016; O8193432. DOI: 10.1161/CIR.0000000000000426   MEDICATION ADHERENCES TIPS AND STRATEGIES 1. Taking medication as prescribed improves patient outcomes in heart failure (reduces hospitalizations, improves symptoms, increases survival) 2. Side effects of medications can be managed by decreasing doses, switching agents, stopping drugs, or adding additional therapy. Please let someone in the [facility] know if you have having bothersome side effects so we can modify your regimen. Do not alter your medication regimen without talking to [place].  3. Medication reminders can help patients remember to take drugs on time. If you are missing or forgetting doses you can try linking behaviors, using pill boxes, or an electronic reminder like an alarm on your phone or an app. Some people can also get automated phone calls as medication reminders.

## 2019-12-30 NOTE — Progress Notes (Signed)
Patient ID: Willeen [name]., male    DOB: 1956/07/23, 64 y.o.   MRN: [id number]  HPI  William [name] William [name] is a 64 y/o male with a history of DM, HTN, GERD, PUD, anxiety, anemia, gastric bypass and heart failure.   Echo report from 11/26/19 reviewed and showed an EF of <20% along with William [name].   Admitted 11/24/19 due to acute on chronic HF along with AF with RVR. Cardiology consult obtained. Initially needed IV amiodarone. William [name] was stopped due to hypotension. Was cardioverted. Discharged after 3 days.   He presents today for a follow-up visit with a chief complaint of minimal fatigue upon moderate exertion. He describes this as chronic in nature having been present for several months although is markedly improved and he says that it is very minimal at this time. He has associated anxiety along with this. He denies any difficulty sleeping, dizziness, abdominal distention, palpitations, pedal edema, chest pain, wheezing, shortness of breath, cough or weight gain.   He brings his home BP readings and his BP has ranged from 118-142/ 69-88.  Past Medical History:  Diagnosis Date  . Anxiety   . 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 CHF (congestive heart failure) ([place] [name])   . Diabetes mellitus without complication (William [name])   . GERD (gastroesophageal reflux disease)   . Hypertension   . Iron deficiency anemia   . Obesity   . Osteoarthritis of hip    right  . Peptic ulcer disease    Past Surgical History:  Procedure Laterality Date  . CARDIOVERSION N/A 11/26/2019   Procedure: CARDIOVERSION;  Surgeon: William Spray, MD;  Location: [facility] ORS;  Service: Cardiovascular;  Laterality: N/A;  . COLONOSCOPY    . ESOPHAGOGASTRODUODENOSCOPY    . GASTRIC BYPASS     2009  . NASAL SINUS SURGERY    . TOTAL HIP ARTHROPLASTY Right 08/20/2016   Procedure: TOTAL HIP ARTHROPLASTY ANTERIOR APPROACH;  Surgeon: William Knows, MD;  Location: [facility] ORS;  Service: Orthopedics;  Laterality: Right;   Family History  Problem  Relation Age of Onset  . Dementia Mother   . Diabetes Father    Social History   Tobacco Use  . Smoking status: Never Smoker  . Smokeless tobacco: Never Used  Substance Use Topics  . Alcohol use: No   Allergies  Allergen Reactions  . Morphine And Related Hives  . Nsaids Other (See Comments)    Gastric bypass  . Opium Hives    Opiate drip when had gastric bypass drug unknown (morphine drip)   Prior to Admission medications   Medication Sig Start Date End Date Taking? Authorizing Provider  amiodarone (PACERONE) 200 MG tablet Take 1 tablet (200 mg total) by mouth 2 (two) times daily. Patient taking differently: Take 200 mg by mouth daily.  11/27/19  Yes William Hanlon, MD  apixaban (ELIQUIS) 5 MG TABS tablet Take 1 tablet (5 mg total) by mouth 2 (two) times daily. 11/27/19  Yes William Hanlon, MD  carvedilol (COREG) 3.125 MG tablet Take 1 tablet (3.125 mg total) by mouth 2 (two) times daily with a meal. 11/27/19  Yes William Hanlon, MD  Cyanocobalamin (B-12) 2500 MCG TABS Take 5,000 mcg by mouth daily.    Yes [provider]  ferrous sulfate 325 (65 FE) MG tablet Take 1 tablet (325 mg total) by mouth daily. 11/28/19  Yes William Hanlon, MD  FLUoxetine (PROZAC) 20 MG capsule Take 20 mg by mouth every evening.  07/20/16  Yes [provider]  furosemide (LASIX) 20 MG tablet Take 0.5 tablets (10 mg total) by mouth daily as needed for fluid (SOB or weight is up by 3lbs). Patient taking differently: Take 40 mg by mouth daily.  11/27/19  Yes William Hanlon, MD  JARDIANCE 25 MG TABS tablet Take 25 mg by mouth daily. 11/02/19  Yes [provider]  metFORMIN (GLUCOPHAGE) 1000 MG tablet Take 1,000 mg by mouth 2 (two) times daily with a meal.    Yes [provider]  Multiple Vitamin (MULTIVITAMIN WITH MINERALS) TABS tablet Take 1 tablet by mouth daily.   Yes [provider]  pantoprazole (PROTONIX) 40 MG tablet Take 40 mg by mouth every evening.    Yes [provider]  pravastatin (PRAVACHOL) 40 MG tablet Take 40 mg by mouth at bedtime.  09/22/19  Yes [provider]     Review of Systems  Constitutional: Positive for fatigue (very little). Negative for appetite change.  HENT: Negative for congestion, postnasal drip and sore throat.   Eyes: Negative.   Respiratory: Negative for cough, chest tightness, shortness of breath and wheezing.   Cardiovascular: Negative for chest pain, palpitations and leg swelling.  Gastrointestinal: Negative for abdominal distention and abdominal pain.  Endocrine: Negative.   Genitourinary: Negative.   Musculoskeletal: Negative for back pain and neck pain.  Skin: Negative.   Allergic/Immunologic: Negative.   Neurological: Negative for dizziness and light-headedness.  Hematological: Negative for adenopathy. Does not bruise/bleed easily.  Psychiatric/Behavioral: Negative for dysphoric mood and sleep disturbance (sleeping on 1 pillow). The patient is nervous/anxious.    Vitals:   12/31/19 0823  BP: (!) 144/87  Pulse: 74  Resp: 18  SpO2: 97%  Weight: (!) 322 lb 8 oz (146.3 kg)  Height: 5\' 9"  (1.753 m)   Wt Readings from Last 3 Encounters:  12/31/19 (!) 322 lb 8 oz (146.3 kg)  12/17/19 290 lb 8 oz (131.8 kg)  11/27/19 (!) 349 lb 4.8 oz (158.4 kg)   Lab Results  Component Value Date   CREATININE 1.04 12/17/2019   CREATININE 0.84 11/27/2019   CREATININE 1.05 11/26/2019    Physical Exam Vitals and nursing note reviewed.  Constitutional:      Appearance: Normal appearance.  HENT:     Head: Normocephalic and atraumatic.  Cardiovascular:     Rate and Rhythm: Normal rate and regular rhythm.  Pulmonary:     Effort: Pulmonary effort is normal. No respiratory distress.     Breath sounds: No wheezing or rales.  Abdominal:     General: Abdomen is flat. There is no distension.     Palpations: Abdomen is soft.  Musculoskeletal:        General: No tenderness.     Cervical back: Normal range of  motion and neck supple.     Right lower leg: No edema.     Left lower leg: No edema.  Skin:    General: Skin is warm and dry.  Neurological:     General: No focal deficit present.     Mental Status: He is alert and oriented to person, place, and time.  Psychiatric:        Mood and Affect: Mood is anxious.        Behavior: Behavior normal.        Thought Content: Thought content normal.      Assessment & Plan:  1: Chronic heart failure with reduced ejection fraction- - NYHA II - euvolemic today - weighing  daily & says that his home weight has stayed ~ 317 pounds; reminded to call for an overnight weight gain of >2 pounds or a weekly weight gain of >5 pounds - doubt our last office weight was correct as today's weight is 22 pounds heavier - not adding salt and has been trying to keep daily sodium intake to 2000mg / day - based on home BP readings, will begin entresto 24/26mg  BID; voucher given to patient for the 1st month's dose - will get BMP at his next office visit - titrate carvedilol/entresto as able at future visits - saw cardiology Nehemiah Massed) 02/20/18 - BNP 11/25/19 was 806.0  2: HTN- - BP looks good today; beginning entresto per above - had telemedicine with PCP Caryl Comes) 11/19/19 - BMP 12/17/19 reviewed and showed sodium 139, potassium 4.5, creatinine 1.04 and GFR >60  3: DM- - A1c 11/12/19 was 8.2% - glucose at home yesterday was 149 - had gastric bypass in 2009 when he weighed in the 500's   Medication bottles were reviewed.   Return in 3 weeks or sooner for any questions/problems before then.

## 2019-12-31 ENCOUNTER — Encounter: Payer: Self-pay | Admitting: Family

## 2019-12-31 ENCOUNTER — Other Ambulatory Visit: Payer: Self-pay

## 2019-12-31 ENCOUNTER — Ambulatory Visit: Payer: Managed Care, Other (non HMO) | Attending: Family | Admitting: Family

## 2019-12-31 VITALS — BP 144/87 | HR 74 | Resp 18 | Ht 69.0 in | Wt 322.5 lb

## 2019-12-31 DIAGNOSIS — Z79899 Other long term (current) drug therapy: Secondary | ICD-10-CM | POA: Diagnosis not present

## 2019-12-31 DIAGNOSIS — Z7984 Long term (current) use of oral hypoglycemic drugs: Secondary | ICD-10-CM | POA: Insufficient documentation

## 2019-12-31 DIAGNOSIS — Z7901 Long term (current) use of anticoagulants: Secondary | ICD-10-CM | POA: Insufficient documentation

## 2019-12-31 DIAGNOSIS — Z833 Family history of diabetes mellitus: Secondary | ICD-10-CM | POA: Insufficient documentation

## 2019-12-31 DIAGNOSIS — D509 Iron deficiency anemia, unspecified: Secondary | ICD-10-CM | POA: Diagnosis not present

## 2019-12-31 DIAGNOSIS — I5022 Chronic systolic (congestive) heart failure: Secondary | ICD-10-CM | POA: Insufficient documentation

## 2019-12-31 DIAGNOSIS — Z6841 Body Mass Index (BMI) 40.0 and over, adult: Secondary | ICD-10-CM | POA: Insufficient documentation

## 2019-12-31 DIAGNOSIS — E119 Type 2 diabetes mellitus without complications: Secondary | ICD-10-CM | POA: Diagnosis not present

## 2019-12-31 DIAGNOSIS — Z8711 Personal history of peptic ulcer disease: Secondary | ICD-10-CM | POA: Insufficient documentation

## 2019-12-31 DIAGNOSIS — K219 Gastro-esophageal reflux disease without esophagitis: Secondary | ICD-10-CM | POA: Diagnosis not present

## 2019-12-31 DIAGNOSIS — Z885 Allergy status to narcotic agent status: Secondary | ICD-10-CM | POA: Diagnosis not present

## 2019-12-31 DIAGNOSIS — Z96641 Presence of right artificial hip joint: Secondary | ICD-10-CM | POA: Diagnosis not present

## 2019-12-31 DIAGNOSIS — Z85048 Personal history of other malignant neoplasm of rectum, rectosigmoid junction, and anus: Secondary | ICD-10-CM | POA: Insufficient documentation

## 2019-12-31 DIAGNOSIS — I509 Heart failure, unspecified: Secondary | ICD-10-CM | POA: Diagnosis present

## 2019-12-31 DIAGNOSIS — Z9884 Bariatric surgery status: Secondary | ICD-10-CM | POA: Diagnosis not present

## 2019-12-31 DIAGNOSIS — I10 Essential (primary) hypertension: Secondary | ICD-10-CM

## 2019-12-31 DIAGNOSIS — Z886 Allergy status to analgesic agent status: Secondary | ICD-10-CM | POA: Insufficient documentation

## 2019-12-31 DIAGNOSIS — I110 Hypertensive heart disease with heart failure: Secondary | ICD-10-CM | POA: Insufficient documentation

## 2019-12-31 DIAGNOSIS — E669 Obesity, unspecified: Secondary | ICD-10-CM | POA: Insufficient documentation

## 2019-12-31 DIAGNOSIS — F419 Anxiety disorder, unspecified: Secondary | ICD-10-CM | POA: Diagnosis not present

## 2019-12-31 MED ORDER — SACUBITRIL-VALSARTAN 24-26 MG PO TABS: 1.0000 | ORAL_TABLET | Freq: Two times a day (BID) | ORAL | 3 refills | Status: DC

## 2019-12-31 NOTE — Patient Instructions (Addendum)
Continue weighing daily and call for an overnight weight gain of > 2 pounds or a weekly weight gain of >5 pounds.   Start taking entresto 24/26mg  twice daily

## 2020-01-06 ENCOUNTER — Other Ambulatory Visit: Payer: Self-pay | Admitting: Family

## 2020-01-06 MED ORDER — SACUBITRIL-VALSARTAN 24-26 MG PO TABS: 1.0000 | ORAL_TABLET | Freq: Two times a day (BID) | ORAL | 3 refills | Status: DC

## 2020-01-21 ENCOUNTER — Ambulatory Visit: Payer: Managed Care, Other (non HMO) | Admitting: Family

## 2020-01-27 NOTE — Progress Notes (Signed)
Patient ID: William [name]., male    DOB: Jan 10, 1956, 64 y.o.   MRN: [id number]  HPI  [name] is a 64 y/o male with a history of DM, HTN, GERD, PUD, anxiety, anemia, gastric bypass and heart failure.   Echo report from 11/26/19 reviewed and showed an EF of <20% along with MR.   Admitted 11/24/19 due to acute on chronic HF along with AF with RVR. Cardiology consult obtained. Initially needed IV amiodarone. Delene Loll was stopped due to hypotension. Was cardioverted. Discharged after 3 days.   He presents today for a follow-up visit with a chief complaint of minimal fatigue upon moderate exertion. He describes this as being present for several months but is markedly improved from his admission back in March 2021. He has anxiety along with this. He denies any difficulty sleeping, dizziness, abdominal distention, palpitations, pedal edema, chest pain, shortness of breath, cough or weight gain.   Home BP's look good since starting entresto 24/26mg  BID at last visit. Says that he only drinks 2 cups of coffee in the morning and 1 other cup of fluids during the day.   Past Medical History:  Diagnosis Date  . Anxiety   . 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 CHF (congestive heart failure) ([place])   . Diabetes mellitus without complication ([place])   . GERD (gastroesophageal reflux disease)   . Hypertension   . Iron deficiency anemia   . Obesity   . Osteoarthritis of hip    right  . Peptic ulcer disease    Past Surgical History:  Procedure Laterality Date  . CARDIOVERSION N/A 11/26/2019   Procedure: CARDIOVERSION;  Surgeon: Teodoro Spray, MD;  Location: [facility] ORS;  Service: Cardiovascular;  Laterality: N/A;  . COLONOSCOPY    . ESOPHAGOGASTRODUODENOSCOPY    . GASTRIC BYPASS     2009  . NASAL SINUS SURGERY    . TOTAL HIP ARTHROPLASTY Right 08/20/2016   Procedure: TOTAL HIP ARTHROPLASTY ANTERIOR APPROACH;  Surgeon: Hessie Knows, MD;  Location: [facility] ORS;  Service: Orthopedics;  Laterality:  Right;   Family History  Problem Relation Age of Onset  . Dementia Mother   . Diabetes Father    Social History   Tobacco Use  . Smoking status: Never Smoker  . Smokeless tobacco: Never Used  Substance Use Topics  . Alcohol use: No   Allergies  Allergen Reactions  . Morphine And Related Hives  . Nsaids Other (See Comments)    Gastric bypass  . Opium Hives    Opiate drip when had gastric bypass drug unknown (morphine drip)    Prior to Admission medications   Medication Sig Start Date End Date Taking? Authorizing Provider  amiodarone (PACERONE) 200 MG tablet Take 1 tablet (200 mg total) by mouth 2 (two) times daily. Patient taking differently: Take 200 mg by mouth daily.  11/27/19  Yes Nolberto Hanlon, MD  apixaban (ELIQUIS) 5 MG TABS tablet Take 1 tablet (5 mg total) by mouth 2 (two) times daily. 11/27/19  Yes Nolberto Hanlon, MD  carvedilol (COREG) 3.125 MG tablet Take 1 tablet (3.125 mg total) by mouth 2 (two) times daily with a meal. 11/27/19  Yes Nolberto Hanlon, MD  Cyanocobalamin (B-12) 2500 MCG TABS Take 5,000 mcg by mouth daily.    Yes [provider]  ferrous sulfate 325 (65 FE) MG tablet Take 1 tablet (325 mg total) by mouth daily. 11/28/19  Yes Nolberto Hanlon, MD  FLUoxetine (PROZAC) 20 MG  capsule Take 20 mg by mouth every evening.  07/20/16  Yes [provider]  furosemide (LASIX) 20 MG tablet Take 0.5 tablets (10 mg total) by mouth daily as needed for fluid (SOB or weight is up by 3lbs). Patient taking differently: Take 40 mg by mouth daily.  11/27/19  Yes Nolberto Hanlon, MD  JARDIANCE 25 MG TABS tablet Take 25 mg by mouth daily. 11/02/19  Yes [provider]  metFORMIN (GLUCOPHAGE) 1000 MG tablet Take 1,000 mg by mouth 2 (two) times daily with a meal.    Yes [provider]  Multiple Vitamin (MULTIVITAMIN WITH MINERALS) TABS tablet Take 1 tablet by mouth daily.   Yes [provider]  pantoprazole (PROTONIX) 40 MG tablet Take 40 mg by mouth  every evening.    Yes [provider]  pravastatin (PRAVACHOL) 40 MG tablet Take 40 mg by mouth at bedtime.  09/22/19  Yes [provider]  sacubitril-valsartan (ENTRESTO) 24-26 MG Take 1 tablet by mouth 2 (two) times daily. 01/06/20  Yes Alisa Graff, FNP     Review of Systems  Constitutional: Positive for fatigue (very little). Negative for appetite change.  HENT: Negative for congestion, postnasal drip and sore throat.   Eyes: Negative.   Respiratory: Negative for cough, chest tightness, shortness of breath and wheezing.   Cardiovascular: Negative for chest pain, palpitations and leg swelling.  Gastrointestinal: Negative for abdominal distention and abdominal pain.  Endocrine: Negative.   Genitourinary: Negative.   Musculoskeletal: Negative for back pain and neck pain.  Skin: Negative.   Allergic/Immunologic: Negative.   Neurological: Negative for dizziness and light-headedness.  Hematological: Negative for adenopathy. Does not bruise/bleed easily.  Psychiatric/Behavioral: Negative for dysphoric mood and sleep disturbance (sleeping on 1 pillow). The patient is nervous/anxious.    Vitals:   01/28/20 1036  BP: 124/81  Pulse: 74  Resp: 18  SpO2: 98%  Weight: (!) 321 lb 6 oz (145.8 kg)  Height: 5\' 11"  (1.803 m)   Wt Readings from Last 3 Encounters:  01/28/20 (!) 321 lb 6 oz (145.8 kg)  12/31/19 (!) 322 lb 8 oz (146.3 kg)  12/17/19 290 lb 8 oz (131.8 kg)   Lab Results  Component Value Date   CREATININE 1.04 12/17/2019   CREATININE 0.84 11/27/2019   CREATININE 1.05 11/26/2019     Physical Exam Vitals and nursing note reviewed.  Constitutional:      Appearance: Normal appearance.  HENT:     Head: Normocephalic and atraumatic.  Cardiovascular:     Rate and Rhythm: Normal rate and regular rhythm.  Pulmonary:     Effort: Pulmonary effort is normal. No respiratory distress.     Breath sounds: No wheezing or rales.  Abdominal:     General: Abdomen is  flat. There is no distension.     Palpations: Abdomen is soft.  Musculoskeletal:        General: No tenderness.     Cervical back: Normal range of motion and neck supple.     Right lower leg: No edema.     Left lower leg: No edema.  Skin:    General: Skin is warm and dry.  Neurological:     General: No focal deficit present.     Mental Status: He is alert and oriented to person, place, and time.  Psychiatric:        Mood and Affect: Mood is anxious.        Behavior: Behavior normal.  Thought Content: Thought content normal.      Assessment & Plan:  1: Chronic heart failure with reduced ejection fraction- - NYHA II - euvolemic today - weighing daily; reminded to call for an overnight weight gain of >2 pounds or a weekly weight gain of >5 pounds - weight down 1 pound from last visit here 1 month ago - not adding salt and has been trying to keep daily sodium intake to 2000mg / day - entresto 24/26mg  BID started at last visit and tolerated without known side effects - will get BMP today - will increase carvedilol today to 6.25mg  BID; advised patient that he could finish up what he has by taking 2 tablets BID until his new RX arrives and then he'll go back to 1 tablet BID - continue to titrate carvedilol/entresto as able at future visits - saw cardiology Nehemiah Massed) 02/20/18 but is planning on seeing one of his partners - explained that he needed to increase his fluid intake to closer to 60 ounces daily especially as the hot/ humid weather begins - BNP 11/25/19 was 806.0  2: HTN- - BP looks good today - had telemedicine with PCP Caryl Comes) 11/19/19 - BMP 12/17/19 reviewed and showed sodium 139, potassium 4.5, creatinine 1.04 and GFR >60  3: DM- - A1c 11/12/19 was 8.2% - glucose at home today was 149 - had gastric bypass in 2009 when he weighed in the 500's   Medication bottles were reviewed.   Return in 1 month or sooner for any questions/problems before then.

## 2020-01-28 ENCOUNTER — Ambulatory Visit: Payer: Managed Care, Other (non HMO) | Attending: Family | Admitting: Family

## 2020-01-28 ENCOUNTER — Encounter: Payer: Self-pay | Admitting: Family

## 2020-01-28 ENCOUNTER — Other Ambulatory Visit: Payer: Self-pay

## 2020-01-28 VITALS — BP 124/81 | HR 74 | Resp 18 | Ht 71.0 in | Wt 321.4 lb

## 2020-01-28 DIAGNOSIS — F329 Major depressive disorder, single episode, unspecified: Secondary | ICD-10-CM | POA: Diagnosis not present

## 2020-01-28 DIAGNOSIS — K219 Gastro-esophageal reflux disease without esophagitis: Secondary | ICD-10-CM | POA: Diagnosis not present

## 2020-01-28 DIAGNOSIS — E119 Type 2 diabetes mellitus without complications: Secondary | ICD-10-CM

## 2020-01-28 DIAGNOSIS — I4892 Unspecified atrial flutter: Secondary | ICD-10-CM | POA: Insufficient documentation

## 2020-01-28 DIAGNOSIS — Z7901 Long term (current) use of anticoagulants: Secondary | ICD-10-CM | POA: Insufficient documentation

## 2020-01-28 DIAGNOSIS — I5022 Chronic systolic (congestive) heart failure: Secondary | ICD-10-CM | POA: Diagnosis not present

## 2020-01-28 DIAGNOSIS — Z7984 Long term (current) use of oral hypoglycemic drugs: Secondary | ICD-10-CM | POA: Insufficient documentation

## 2020-01-28 DIAGNOSIS — F419 Anxiety disorder, unspecified: Secondary | ICD-10-CM | POA: Insufficient documentation

## 2020-01-28 DIAGNOSIS — I110 Hypertensive heart disease with heart failure: Secondary | ICD-10-CM | POA: Diagnosis not present

## 2020-01-28 DIAGNOSIS — R5383 Other fatigue: Secondary | ICD-10-CM | POA: Insufficient documentation

## 2020-01-28 DIAGNOSIS — E118 Type 2 diabetes mellitus with unspecified complications: Secondary | ICD-10-CM | POA: Diagnosis not present

## 2020-01-28 DIAGNOSIS — E669 Obesity, unspecified: Secondary | ICD-10-CM | POA: Diagnosis not present

## 2020-01-28 DIAGNOSIS — Z9884 Bariatric surgery status: Secondary | ICD-10-CM | POA: Insufficient documentation

## 2020-01-28 DIAGNOSIS — I10 Essential (primary) hypertension: Secondary | ICD-10-CM

## 2020-01-28 DIAGNOSIS — Z79899 Other long term (current) drug therapy: Secondary | ICD-10-CM | POA: Insufficient documentation

## 2020-01-28 DIAGNOSIS — D509 Iron deficiency anemia, unspecified: Secondary | ICD-10-CM | POA: Diagnosis not present

## 2020-01-28 LAB — BASIC METABOLIC PANEL
Anion gap: 11 (ref 5–15)
BUN: 16 mg/dL (ref 8–23)
CO2: 27 mmol/L (ref 22–32)
Calcium: 8.7 mg/dL — ABNORMAL LOW (ref 8.9–10.3)
Chloride: 101 mmol/L (ref 98–111)
Creatinine, Ser: 0.92 mg/dL (ref 0.61–1.24)
GFR calc Af Amer: >60 mL/min (ref >60)
GFR calc non Af Amer: >60 mL/min (ref >60)
Glucose, Bld: 143 mg/dL — ABNORMAL HIGH (ref 70–99)
Potassium: 4.5 mmol/L (ref 3.5–5.1)
Sodium: 139 mmol/L (ref 135–145)

## 2020-01-28 MED ORDER — SACUBITRIL-VALSARTAN 24-26 MG PO TABS: 1.0000 | ORAL_TABLET | Freq: Two times a day (BID) | ORAL | 3 refills | Status: DC

## 2020-01-28 MED ORDER — CARVEDILOL 6.25 MG PO TABS: 6.2500 mg | ORAL_TABLET | Freq: Two times a day (BID) | ORAL | 3 refills | Status: DC

## 2020-01-28 NOTE — Patient Instructions (Addendum)
Continue weighing daily and call for an overnight weight gain of > 2 pounds or a weekly weight gain of >5 pounds.  Increase carvedilol by taking 2 tablets twice daily until gone. When you get the new bottle mailed to you, you will then go back to taking 1 tablet twice daily.

## 2020-01-28 NOTE — Progress Notes (Signed)
[place] - PHARMACIST COUNSELING NOTE  ADHERENCE ASSESSMENT  Adherence strategy: Takes his medications with breakfast and with dinner. Utilizes a pillbox at home.   Do you ever forget to take your medication? [] Yes (1) [x] No (0)  Do you ever skip doses due to side effects? [] Yes (1) [x] No (0)  Do you have trouble affording your medicines? [] Yes (1) [x] No (0)  Are you ever unable to pick up your medication due to transportation difficulties? [] Yes (1) [x] No (0)  Do you ever stop taking your medications because you don't believe they are helping? [] Yes (1) [x] No (0)  Total score 0    Recommendations given to patient about increasing adherence: None  Guideline-Directed Medical Therapy/Evidence Based Medicine  ACE/ARB/ARNI: Entresto 24-26 mg BID Beta Blocker: Carvedilol 3.125 mg BID Aldosterone Antagonist: None Diuretic: Furosemide 40 mg daily    SUBJECTIVE  HPI: Patient is a 64 y/o M with PMH as below who presents to CHF clinic for follow-up. He was hospitalized 3/17-3/20 for acute CHF exacerbation & atrial flutter with RVR and was cardioverted. It is thought that there was likely correlation to receipt of J&J vaccine and new-onset heart failure.   Past Medical History:  Diagnosis Date  . Anxiety   . 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 CHF (congestive heart failure) ([place])   . Diabetes mellitus without complication ([place])   . GERD (gastroesophageal reflux disease)   . Hypertension   . Iron deficiency anemia   . Obesity   . Osteoarthritis of hip    right  . Peptic ulcer disease       OBJECTIVE   Vital signs: HR 74, BP 124/81, weight (pounds) 321 ECHO: Date 11/26/19, EF <20%, notes: LV global hypokinesis, LV internal cavity moderately - severely dilated, grade 1 diastolic dysfunction. RV systolic function moderately reduced , RV mildly enlarged. LA mildly dilated, RA mildly dilated. Mild MVR, mild TVR.  BMP Latest Ref Rng  & Units 12/17/2019 11/27/2019 11/26/2019  Glucose 70 - 99 mg/dL 133(H) 188(H) 190(H)  BUN 8 - 23 mg/dL 20 28(H) 30(H)  Creatinine 0.61 - 1.24 mg/dL 1.04 0.84 1.05  Sodium 135 - 145 mmol/L 139 138 136  Potassium 3.5 - 5.1 mmol/L 4.5 3.6 3.7  Chloride 98 - 111 mmol/L 103 107 105  CO2 22 - 32 mmol/L 26 21(L) 21(L)  Calcium 8.9 - 10.3 mg/dL 8.8(L) 8.3(L) 8.3(L)    ASSESSMENT  Patient is well appearing and in no acute distress. He brought his medications with him to today's visit. He is compliant with his medication regimen. Denies adverse effects of therapies / missed doses. He is maintaining a log of blood pressures & HR. He checks his BP/HR before meds and after meds. Upon review of recorded pressures , he is consistently normotensive with normal HR. There was one isolated episode of what appears to be orthostatic hypotension after patient took medications and became dizzy / lightheaded after standing up too quickly. No falls. He is weighing himself daily and reports weights have been stable. His weight today is 321 lbs and his weight at his last visit on 12/31/19 was 322 lbs. He reports sleeping well - utilizes two pillows. Denies PND.   PLAN  1). HFrEF -GDMT: carvedilol 3.125 mg BID, Entresto 24-26 mg BID -Also on Jardiance 25 mg daily -Fluid management with furosemide 40 mg daily -Continue daily weights -Continue low sodium diet -Discussed with provider - increase carvedilol to 6.25 mg  BID and check BMP today. RTC 1 month  2). Hypertension -Normotensive today -He is checking BP daily at home. Pressures have been normal -Antihypertensive regimen includes carvedilol 3.125 mg BID, Entresto 24-26 mg BID, furosemide 40 mg daily  3). Atrial flutter -HR 74 today. He is checking HR at home ; no bradycardia or tachycardia -Previous cardioverted during 11/2019 hospitalization -Rhythm control with amiodarone 200 mg daily -Rate control with carvedilol 3.125 mg BID -Anticoagulation with apixaban 5  mg BID  4). T2DM, un-controlled -Last HgbA1c was 8.2% on 11/12/19 which had decreased from 9.5% on 07/09/19 -Antihyperglycemic regimen includes Jardiance 25 mg daily, metformin 1000 mg BID -He is checking BG at home QAM -Pravastatin 40 mg QHS (moderate intensity) ; lipid panel 11/12/19 with TG 163, LDL 94, HDL 46  5). Iron deficiency anemia -Hgb 12.3, MCV 82.6 on 11/24/19 -Ferrous sulfate 325 mg daily  6). GERD -Pantoprazole 40 mg every evening  7). Depression -Fluoxetine 20 mg QHS   Time spent: 25 minutes  [place] Resident 01/28/2020 9:38 AM    Current Outpatient Medications:  .  amiodarone (PACERONE) 200 MG tablet, Take 1 tablet (200 mg total) by mouth 2 (two) times daily. (Patient taking differently: Take 200 mg by mouth daily. ), Disp: 30 tablet, Rfl: 0 .  apixaban (ELIQUIS) 5 MG TABS tablet, Take 1 tablet (5 mg total) by mouth 2 (two) times daily., Disp: 60 tablet, Rfl: 0 .  carvedilol (COREG) 3.125 MG tablet, Take 1 tablet (3.125 mg total) by mouth 2 (two) times daily with a meal., Disp: 60 tablet, Rfl: 0 .  Cyanocobalamin (B-12) 2500 MCG TABS, Take 5,000 mcg by mouth daily. , Disp: , Rfl:  .  ferrous sulfate 325 (65 FE) MG tablet, Take 1 tablet (325 mg total) by mouth daily., Disp: 60 tablet, Rfl: 0 .  FLUoxetine (PROZAC) 20 MG capsule, Take 20 mg by mouth every evening. , Disp: , Rfl: 3 .  furosemide (LASIX) 20 MG tablet, Take 0.5 tablets (10 mg total) by mouth daily as needed for fluid (SOB or weight is up by 3lbs). (Patient taking differently: Take 40 mg by mouth daily. ), Disp: 30 tablet, Rfl: 0 .  JARDIANCE 25 MG TABS tablet, Take 25 mg by mouth daily., Disp: , Rfl:  .  metFORMIN (GLUCOPHAGE) 1000 MG tablet, Take 1,000 mg by mouth 2 (two) times daily with a meal. , Disp: , Rfl:  .  Multiple Vitamin (MULTIVITAMIN WITH MINERALS) TABS tablet, Take 1 tablet by mouth daily., Disp: , Rfl:  .  pantoprazole (PROTONIX) 40 MG tablet, Take 40 mg by mouth every  evening. , Disp: , Rfl:  .  pravastatin (PRAVACHOL) 40 MG tablet, Take 40 mg by mouth at bedtime. , Disp: , Rfl:  .  sacubitril-valsartan (ENTRESTO) 24-26 MG, Take 1 tablet by mouth 2 (two) times daily., Disp: 180 tablet, Rfl: 3   COUNSELING POINTS/CLINICAL PEARLS  Carvedilol (Goal: weight less than 85 kg is 25 mg BID, weight greater than 85 kg is 50 mg BID)  Patient should avoid activities requiring coordination until drug effects are realized, as drug may cause dizziness.  This drug may cause diarrhea, nausea, vomiting, arthralgia, back pain, myalgia, headache, vision disorder, erectile dysfunction, reduced libido, or fatigue.  Instruct patient to report signs/symptoms of adverse cardiovascular effects such as hypotension (especially in elderly patients), arrhythmias, syncope, palpitations, angina, or edema.  Drug may mask symptoms of hypoglycemia. Advise diabetic patients to carefully monitor blood sugar levels.  Patient  should take drug with food.  Advise patient against sudden discontinuation of drug. Entresto (Goal: 97/103 mg twice daily)  Warn male patient to avoid pregnancy during therapy and to report a pregnancy to a physician.  Advise patient to report symptomatic hypotension.  Side effects may include hyperkalemia, cough, dizziness, or renal failure. Furosemide  Drug causes sun-sensitivity. Advise patient to use sunscreen and avoid tanning beds. Patient should avoid activities requiring coordination until drug effects are realized, as drug may cause dizziness, vertigo, or blurred vision. This drug may cause hyperglycemia, hyperuricemia, constipation, diarrhea, loss of appetite, nausea, vomiting, purpuric disorder, cramps, spasticity, asthenia, headache, paresthesia, or scaling eczema. Instruct patient to report unusual bleeding/bruising or signs/symptoms of hypotension, infection, pancreatitis, or ototoxicity (tinnitus, hearing impairment). Advise patient to report signs/symptoms  of a severe skin reactions (flu-like symptoms, spreading red rash, or skin/mucous membrane blistering) or erythema multiforme. Instruct patient to eat high-potassium foods during drug therapy, as directed by healthcare professional.  Patient should not drink alcohol while taking this drug.  DRUGS TO AVOID IN HEART FAILURE  Drug or Class Mechanism  Analgesics . NSAIDs . COX-2 inhibitors . Glucocorticoids  Sodium and water retention, increased systemic vascular resistance, decreased response to diuretics   Diabetes Medications . Metformin . Thiazolidinediones o Rosiglitazone (Avandia) o Pioglitazone (Actos) . DPP4 Inhibitors o Saxagliptin (Onglyza) o Sitagliptin (Januvia)   Lactic acidosis Possible calcium channel blockade   Unknown  Antiarrhythmics . Class I  o Flecainide o Disopyramide . Class III o Sotalol . Other o Dronedarone  Negative inotrope, proarrhythmic   Proarrhythmic, beta blockade  Negative inotrope  Antihypertensives . Alpha Blockers o Doxazosin . Calcium Channel Blockers o Diltiazem o Verapamil o Nifedipine . Central Alpha Adrenergics o Moxonidine . Peripheral Vasodilators o Minoxidil  Increases renin and aldosterone  Negative inotrope    Possible sympathetic withdrawal  Unknown  Anti-infective . Itraconazole . Amphotericin B  Negative inotrope Unknown  Hematologic . Anagrelide . Cilostazol   Possible inhibition of PD IV Inhibition of PD III causing arrhythmias  Neurologic/Psychiatric . Stimulants . Anti-Seizure Drugs o Carbamazepine o Pregabalin . Antidepressants o Tricyclics o Citalopram . Parkinsons o Bromocriptine o Pergolide o Pramipexole . Antipsychotics o Clozapine . Antimigraine o Ergotamine o Methysergide . Appetite suppressants . Bipolar o Lithium  Peripheral alpha and beta agonist activity  Negative inotrope and chronotrope Calcium channel blockade  Negative inotrope, proarrhythmic Dose-dependent  QT prolongation  Excessive serotonin activity/valvular damage Excessive serotonin activity/valvular damage Unknown  IgE mediated hypersensitivy, calcium channel blockade  Excessive serotonin activity/valvular damage Excessive serotonin activity/valvular damage Valvular damage  Direct myofibrillar degeneration, adrenergic stimulation  Antimalarials . Chloroquine . Hydroxychloroquine Intracellular inhibition of lysosomal enzymes  Urologic Agents . Alpha Blockers o Doxazosin o Prazosin o Tamsulosin o Terazosin  Increased renin and aldosterone  Adapted from Page RL, et al. "Drugs That May Cause or Exacerbate Heart Failure: A Scientific Statement from the [place]." Circulation 2016; [id number]. DOI: 10.1161/CIR.0000000000000426   MEDICATION ADHERENCES TIPS AND STRATEGIES 1. Taking medication as prescribed improves patient outcomes in heart failure (reduces hospitalizations, improves symptoms, increases survival) 2. Side effects of medications can be managed by decreasing doses, switching agents, stopping drugs, or adding additional therapy. Please let someone in the [facility] know if you have having bothersome side effects so we can modify your regimen. Do not alter your medication regimen without talking to [place].  3. Medication reminders can help patients remember to take drugs on time. If you are missing or forgetting  doses you can try linking behaviors, using pill boxes, or an electronic reminder like an alarm on your phone or an app. Some people can also get automated phone calls as medication reminders.

## 2020-03-17 ENCOUNTER — Ambulatory Visit: Payer: Managed Care, Other (non HMO) | Admitting: Family

## 2020-04-12 ENCOUNTER — Ambulatory Visit: Payer: Managed Care, Other (non HMO) | Attending: Family | Admitting: Family

## 2020-04-12 ENCOUNTER — Other Ambulatory Visit: Payer: Self-pay

## 2020-04-12 ENCOUNTER — Encounter: Payer: Self-pay | Admitting: Family

## 2020-04-12 VITALS — BP 142/83 | HR 70 | Resp 18 | Ht 71.0 in | Wt 323.4 lb

## 2020-04-12 DIAGNOSIS — K219 Gastro-esophageal reflux disease without esophagitis: Secondary | ICD-10-CM | POA: Insufficient documentation

## 2020-04-12 DIAGNOSIS — Z886 Allergy status to analgesic agent status: Secondary | ICD-10-CM | POA: Insufficient documentation

## 2020-04-12 DIAGNOSIS — Z9884 Bariatric surgery status: Secondary | ICD-10-CM | POA: Diagnosis not present

## 2020-04-12 DIAGNOSIS — F419 Anxiety disorder, unspecified: Secondary | ICD-10-CM | POA: Diagnosis not present

## 2020-04-12 DIAGNOSIS — I10 Essential (primary) hypertension: Secondary | ICD-10-CM

## 2020-04-12 DIAGNOSIS — Z7984 Long term (current) use of oral hypoglycemic drugs: Secondary | ICD-10-CM | POA: Diagnosis not present

## 2020-04-12 DIAGNOSIS — E119 Type 2 diabetes mellitus without complications: Secondary | ICD-10-CM | POA: Diagnosis not present

## 2020-04-12 DIAGNOSIS — Z833 Family history of diabetes mellitus: Secondary | ICD-10-CM | POA: Insufficient documentation

## 2020-04-12 DIAGNOSIS — Z7901 Long term (current) use of anticoagulants: Secondary | ICD-10-CM | POA: Insufficient documentation

## 2020-04-12 DIAGNOSIS — Z96641 Presence of right artificial hip joint: Secondary | ICD-10-CM | POA: Insufficient documentation

## 2020-04-12 DIAGNOSIS — I110 Hypertensive heart disease with heart failure: Secondary | ICD-10-CM | POA: Insufficient documentation

## 2020-04-12 DIAGNOSIS — E669 Obesity, unspecified: Secondary | ICD-10-CM | POA: Insufficient documentation

## 2020-04-12 DIAGNOSIS — Z8711 Personal history of peptic ulcer disease: Secondary | ICD-10-CM | POA: Insufficient documentation

## 2020-04-12 DIAGNOSIS — I5022 Chronic systolic (congestive) heart failure: Secondary | ICD-10-CM | POA: Diagnosis present

## 2020-04-12 DIAGNOSIS — Z85048 Personal history of other malignant neoplasm of rectum, rectosigmoid junction, and anus: Secondary | ICD-10-CM | POA: Insufficient documentation

## 2020-04-12 DIAGNOSIS — Z79899 Other long term (current) drug therapy: Secondary | ICD-10-CM | POA: Diagnosis not present

## 2020-04-12 DIAGNOSIS — Z885 Allergy status to narcotic agent status: Secondary | ICD-10-CM | POA: Diagnosis not present

## 2020-04-12 NOTE — Progress Notes (Signed)
Patient ID: William [name]., male    DOB: [birth year], 64 y.o.   MRN: [id number]  HPI  [name] is a 64 y/o male with a history of DM, HTN, GERD, PUD, anxiety, anemia, gastric bypass and heart failure.   Echo report from 11/26/19 reviewed and showed an EF of <20% along with MR.   Admitted 11/24/19 due to acute on chronic HF along with AF with RVR. Cardiology consult obtained. Initially needed IV amiodarone. Delene Loll was stopped due to hypotension. Was cardioverted. Discharged after 3 days.   He presents today for a follow-up visit with a chief complaint of minimal fatigue upon moderate exertion. He describes this as chronic in nature having been present for several years. He has noticed intermittent light-headedness upon standing since his carvedilol was increased. Doesn't occur on a consistent basis but he does notice that if he doesn't drink enough water, that it tends to occur. He denies any difficulty sleeping, abdominal distention, palpitations, pedal edema, chest pain, shortness of breath, cough or weight gain.   Past Medical History:  Diagnosis Date  . Anxiety   . 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 CHF (congestive heart failure) ([place])   . Diabetes mellitus without complication ([place])   . GERD (gastroesophageal reflux disease)   . Hypertension   . Iron deficiency anemia   . Obesity   . Osteoarthritis of hip    right  . Peptic ulcer disease    Past Surgical History:  Procedure Laterality Date  . CARDIOVERSION N/A 11/26/2019   Procedure: CARDIOVERSION;  Surgeon: Teodoro Spray, MD;  Location: [facility] ORS;  Service: Cardiovascular;  Laterality: N/A;  . COLONOSCOPY    . ESOPHAGOGASTRODUODENOSCOPY    . GASTRIC BYPASS     2009  . NASAL SINUS SURGERY    . TOTAL HIP ARTHROPLASTY Right 08/20/2016   Procedure: TOTAL HIP ARTHROPLASTY ANTERIOR APPROACH;  Surgeon: Hessie Knows, MD;  Location: [facility] ORS;  Service: Orthopedics;  Laterality: Right;   Family History  Problem Relation Age  of Onset  . Dementia Mother   . Diabetes Father    Social History   Tobacco Use  . Smoking status: Never Smoker  . Smokeless tobacco: Never Used  Substance Use Topics  . Alcohol use: No   Allergies  Allergen Reactions  . Morphine And Related Hives  . Nsaids Other (See Comments)    Gastric bypass  . Opium Hives    Opiate drip when had gastric bypass drug unknown (morphine drip)   Prior to Admission medications   Medication Sig Start Date End Date Taking? Authorizing Provider  amiodarone (PACERONE) 200 MG tablet Take 1 tablet (200 mg total) by mouth 2 (two) times daily. Patient taking differently: Take 200 mg by mouth daily.  11/27/19  Yes Nolberto Hanlon, MD  apixaban (ELIQUIS) 5 MG TABS tablet Take 1 tablet (5 mg total) by mouth 2 (two) times daily. 11/27/19  Yes Nolberto Hanlon, MD  carvedilol (COREG) 6.25 MG tablet Take 1 tablet (6.25 mg total) by mouth 2 (two) times daily. 01/28/20  Yes Tyronza Happe A, FNP  Cyanocobalamin (B-12) 2500 MCG TABS Take 5,000 mcg by mouth daily.    Yes [provider]  ferrous sulfate 325 (65 FE) MG tablet Take 1 tablet (325 mg total) by mouth daily. 11/28/19  Yes Nolberto Hanlon, MD  FLUoxetine (PROZAC) 20 MG capsule Take 20 mg by mouth every evening.  07/20/16  Yes [provider]  furosemide (LASIX)  20 MG tablet Take 0.5 tablets (10 mg total) by mouth daily as needed for fluid (SOB or weight is up by 3lbs). Patient taking differently: Take 40 mg by mouth daily.  11/27/19  Yes Nolberto Hanlon, MD  JARDIANCE 25 MG TABS tablet Take 25 mg by mouth daily. 11/02/19  Yes [provider]  metFORMIN (GLUCOPHAGE) 1000 MG tablet Take 1,000 mg by mouth 2 (two) times daily with a meal.    Yes [provider]  Multiple Vitamin (MULTIVITAMIN WITH MINERALS) TABS tablet Take 1 tablet by mouth daily.   Yes [provider]  pantoprazole (PROTONIX) 40 MG tablet Take 40 mg by mouth every evening.    Yes [provider]  pravastatin  (PRAVACHOL) 40 MG tablet Take 40 mg by mouth at bedtime.  09/22/19  Yes [provider]  sacubitril-valsartan (ENTRESTO) 24-26 MG Take 1 tablet by mouth 2 (two) times daily. 01/28/20  Yes Alisa Graff, FNP    Review of Systems  Constitutional: Positive for fatigue (very little). Negative for appetite change.  HENT: Negative for congestion, postnasal drip and sore throat.   Eyes: Negative.   Respiratory: Negative for cough, chest tightness, shortness of breath and wheezing.   Cardiovascular: Negative for chest pain, palpitations and leg swelling.  Gastrointestinal: Negative for abdominal distention and abdominal pain.  Endocrine: Negative.   Genitourinary: Negative.   Musculoskeletal: Negative for back pain and neck pain.  Skin: Negative.   Allergic/Immunologic: Negative.   Neurological: Positive for light-headedness (at times). Negative for dizziness.  Hematological: Negative for adenopathy. Does not bruise/bleed easily.  Psychiatric/Behavioral: Negative for dysphoric mood and sleep disturbance (sleeping on 1 pillow). The patient is nervous/anxious.    Vitals:   04/12/20 0917  BP: (!) 142/83  Pulse: 70  Resp: 18  SpO2: 97%  Weight: (!) 323 lb 6 oz (146.7 kg)  Height: 5\' 11"  (1.803 m)   Wt Readings from Last 3 Encounters:  04/12/20 (!) 323 lb 6 oz (146.7 kg)  01/28/20 (!) 321 lb 6 oz (145.8 kg)  12/31/19 (!) 322 lb 8 oz (146.3 kg)   Lab Results  Component Value Date   CREATININE 0.92 01/28/2020   CREATININE 1.04 12/17/2019   CREATININE 0.84 11/27/2019    Physical Exam Vitals and nursing note reviewed.  Constitutional:      Appearance: Normal appearance.  HENT:     Head: Normocephalic and atraumatic.  Cardiovascular:     Rate and Rhythm: Normal rate and regular rhythm.  Pulmonary:     Effort: Pulmonary effort is normal. No respiratory distress.     Breath sounds: No wheezing or rales.  Abdominal:     General: Abdomen is flat. There is no distension.      Palpations: Abdomen is soft.  Musculoskeletal:        General: No tenderness.     Cervical back: Normal range of motion and neck supple.     Right lower leg: No edema.     Left lower leg: No edema.  Skin:    General: Skin is warm and dry.  Neurological:     General: No focal deficit present.     Mental Status: He is alert and oriented to person, place, and time.  Psychiatric:        Mood and Affect: Mood is anxious.        Behavior: Behavior normal.        Thought Content: Thought content normal.      Assessment & Plan:  1: Chronic heart failure with reduced ejection fraction- - NYHA II - euvolemic today - weighing daily; reminded to call for an overnight weight gain of >2 pounds or a weekly weight gain of >5 pounds - weight up 2 pounds from last visit here 2.5 months ago - not adding salt and has been trying to keep daily sodium intake to 2000mg / day - carvedilol increased to 6.25mg  BID at last visit; has noticed some postural dizziness at times - will not titrate medications today due to intermittent dizziness; has had some low BP's at home per his BP record (108/62); consider titration at next visit - saw cardiology Nehemiah Massed) 02/20/18 but is planning on seeing one of his partners - BNP 11/25/19 was 806.0 - had [employer] COVID vaccine March 2021 with severe reaction afterwards resulting in hospitalization  2: HTN- - BP mildly elevated today but most home BP's look good and some in the 110's/ 70's - had telemedicine with PCP Caryl Comes) 11/19/19 - BMP 12/17/19 reviewed and showed sodium 139, potassium 4.5, creatinine 1.04 and GFR >60  3: DM- - A1c 11/12/19 was 8.2% - glucose at home today was 145 after eating - had gastric bypass in 2009 when he weighed in the 500's   Medication bottles were reviewed.   Return in 3 months or sooner for any questions/problems before then

## 2020-04-12 NOTE — Patient Instructions (Signed)
Continue weighing daily and call for an overnight weight gain of > 2 pounds or a weekly weight gain of >5 pounds. 

## 2020-07-12 NOTE — Progress Notes (Signed)
[name] ID: William Niece., male    DOB: 25-Mar-1956, 64 y.o.   MRN: [id number]  HPI  William [name] is a 64 y/o male with a history of DM, HTN, GERD, PUD, anxiety, anemia, gastric bypass and heart failure.   Echo report from 11/26/19 reviewed and showed an EF of <20% along with William.   Has not been admitted or been in William ED in William last 6 months.   William [name] presents today for a follow-up visit with a chief complaint of minimal fatigue upon moderate exertion. William [name] describes this as chronic in nature having been present for several years. William [name] has associated anxiety along with this. William [name] denies any difficulty sleeping, dizziness, abdominal distention, palpitations, pedal edema, chest pain, shortness of breath, cough or weight gain.   Has concern about getting William [place] [employer] booster vaccine due to his severe reaction to William original dose.   Past Medical History:  Diagnosis Date  . Anxiety   . 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 CHF (congestive heart failure) ([place])   . Diabetes mellitus without complication ([place])   . GERD (gastroesophageal reflux disease)   . Hypertension   . Iron deficiency anemia   . Obesity   . Osteoarthritis of hip    right  . Peptic ulcer disease    Past Surgical History:  Procedure Laterality Date  . CARDIOVERSION N/A 11/26/2019   Procedure: CARDIOVERSION;  Surgeon: Teodoro Spray, MD;  Location: [facility] ORS;  Service: Cardiovascular;  Laterality: N/A;  . COLONOSCOPY    . ESOPHAGOGASTRODUODENOSCOPY    . GASTRIC BYPASS     2009  . NASAL SINUS SURGERY    . TOTAL HIP ARTHROPLASTY Right 08/20/2016   Procedure: TOTAL HIP ARTHROPLASTY ANTERIOR APPROACH;  Surgeon: Hessie Knows, MD;  Location: [facility] ORS;  Service: Orthopedics;  Laterality: Right;   Family History  Problem Relation Age of Onset  . Dementia Mother   . Diabetes Father    Social History   Tobacco Use  . Smoking status: Never Smoker  . Smokeless tobacco: Never Used  Substance Use Topics  . Alcohol use:  No   Allergies  Allergen Reactions  . Morphine And Related Hives  . Nsaids Other (See Comments)    Gastric bypass  . Opium Hives    Opiate drip when had gastric bypass drug unknown (morphine drip)   Prior to Admission medications   Medication Sig Start Date End Date Taking? Authorizing Provider  amiodarone (PACERONE) 200 MG tablet Take 1 tablet (200 mg total) by mouth 2 (two) times daily. [name] taking differently: Take 200 mg by mouth daily.  11/27/19  Yes Nolberto Hanlon, MD  apixaban (ELIQUIS) 5 MG TABS tablet Take 1 tablet (5 mg total) by mouth 2 (two) times daily. 11/27/19  Yes Nolberto Hanlon, MD  carvedilol (COREG) 6.25 MG tablet Take 1 tablet (6.25 mg total) by mouth 2 (two) times daily. 01/28/20  Yes Neli Fofana A, FNP  Cyanocobalamin (B-12) 2500 MCG TABS Take 5,000 mcg by mouth daily.    Yes [provider]  ferrous sulfate 325 (65 FE) MG tablet Take 1 tablet (325 mg total) by mouth daily. 11/28/19  Yes Nolberto Hanlon, MD  FLUoxetine (PROZAC) 20 MG capsule Take 20 mg by mouth every evening.  07/20/16  Yes [provider]  furosemide (LASIX) 20 MG tablet Take 0.5 tablets (10 mg total) by mouth daily as needed for fluid (SOB or weight is up by 3lbs).  [name] taking differently: Take 40 mg by mouth daily.  11/27/19  Yes Nolberto Hanlon, MD  JARDIANCE 25 MG TABS tablet Take 25 mg by mouth daily. 11/02/19  Yes [provider]  metFORMIN (GLUCOPHAGE) 1000 MG tablet Take 1,000 mg by mouth 2 (two) times daily with a meal.    Yes [provider]  Multiple Vitamin (MULTIVITAMIN WITH MINERALS) TABS tablet Take 1 tablet by mouth daily.   Yes [provider]  pantoprazole (PROTONIX) 40 MG tablet Take 40 mg by mouth every evening.    Yes [provider]  pravastatin (PRAVACHOL) 40 MG tablet Take 40 mg by mouth at bedtime.  09/22/19  Yes [provider]  sacubitril-valsartan (ENTRESTO) 24-26 MG Take 1 tablet by mouth 2 (two) times daily. 01/28/20   Yes Alisa Graff, FNP     Review of Systems  Constitutional: Positive for fatigue (very little). Negative for appetite change.  HENT: Negative for congestion, postnasal drip and sore throat.   Eyes: Negative.   Respiratory: Negative for cough, chest tightness, shortness of breath and wheezing.   Cardiovascular: Negative for chest pain, palpitations and leg swelling.  Gastrointestinal: Negative for abdominal distention and abdominal pain.  Endocrine: Negative.   Genitourinary: Negative.   Musculoskeletal: Negative for back pain and neck pain.  Skin: Negative.   Allergic/Immunologic: Negative.   Neurological: Negative for dizziness and light-headedness.  Hematological: Negative for adenopathy. Does not bruise/bleed easily.  Psychiatric/Behavioral: Negative for dysphoric mood and sleep disturbance (sleeping on 1 pillow). William [name] is nervous/anxious.    Vitals:   07/13/20 0903  BP: 121/72  Pulse: 73  Resp: 20  SpO2: 100%  Weight: (!) 324 lb (147 kg)  Height: 5\' 11"  (1.803 m)   Wt Readings from Last 3 Encounters:  07/13/20 (!) 324 lb (147 kg)  04/12/20 (!) 323 lb 6 oz (146.7 kg)  01/28/20 (!) 321 lb 6 oz (145.8 kg)   Lab Results  Component Value Date   CREATININE 0.92 01/28/2020   CREATININE 1.04 12/17/2019   CREATININE 0.84 11/27/2019    Physical Exam Vitals and nursing note reviewed.  Constitutional:      Appearance: Normal appearance.  HENT:     Head: Normocephalic and atraumatic.  Cardiovascular:     Rate and Rhythm: Normal rate and regular rhythm.  Pulmonary:     Effort: Pulmonary effort is normal. No respiratory distress.     Breath sounds: No wheezing or rales.  Abdominal:     General: Abdomen is flat. There is no distension.     Palpations: Abdomen is soft.  Musculoskeletal:        General: No tenderness.     Cervical back: Normal range of motion and neck supple.     Right lower leg: No edema.     Left lower leg: No edema.  Skin:    General: Skin  is warm and dry.  Neurological:     General: No focal deficit present.     Mental Status: William [name] is alert and oriented to person, place, and time.  Psychiatric:        Mood and Affect: Mood is anxious.        Behavior: Behavior normal.        Thought Content: Thought content normal.      Assessment & Plan:  1: Chronic heart failure with reduced ejection fraction- - NYHA II - euvolemic today - weighing daily; reminded to call for an overnight weight gain of >2 pounds or  a weekly weight gain of >5 pounds - weight stable from last visit here 3 months ago - not adding salt and has been trying to keep daily sodium intake to 2000mg / day - saw cardiology Nehemiah Massed) 02/20/18 & has decided to return to him on 08/18/20 - will increase his entresto to 49/51mg  BID; William [name] can finish his current bottle by taking 2 tablets BID until gone and then when William [name] gets William new RX, William [name] will resume taking 1 tablet BID - check BMP at next visit - BNP 11/25/19 was 806.0 - had [employer] COVID vaccine March 2021 with severe reaction afterwards resulting in hospitalization; advised him that William [name] should probably consider getting William pfizer or moderna booster vaccine instead of J & J; [name] says that William [name]'s more comfortable doing that  2: HTN- - BP looks good today; home BP's ranging from 105-142/68-89 - had telemedicine with PCP Caryl Comes) 11/19/19 - BMP 12/17/19 reviewed and showed sodium 139, potassium 4.5, creatinine 1.04 and GFR >60  3: DM- - A1c 11/12/19 was 8.2% - glucose at home today was 144 - had gastric bypass in 2009 when William [name] weighed in William 500's   Medication bottles were reviewed.   Return in 5 weeks or sooner for any questions/problems before then.

## 2020-07-13 ENCOUNTER — Encounter: Payer: Self-pay | Admitting: Family

## 2020-07-13 ENCOUNTER — Ambulatory Visit: Payer: Managed Care, Other (non HMO) | Attending: Family | Admitting: Family

## 2020-07-13 ENCOUNTER — Other Ambulatory Visit: Payer: Self-pay

## 2020-07-13 VITALS — BP 121/72 | HR 73 | Resp 20 | Ht 71.0 in | Wt 324.0 lb

## 2020-07-13 DIAGNOSIS — Z79899 Other long term (current) drug therapy: Secondary | ICD-10-CM | POA: Insufficient documentation

## 2020-07-13 DIAGNOSIS — Z8711 Personal history of peptic ulcer disease: Secondary | ICD-10-CM | POA: Diagnosis not present

## 2020-07-13 DIAGNOSIS — Z2883 Immunization not carried out due to unavailability of vaccine: Secondary | ICD-10-CM | POA: Diagnosis not present

## 2020-07-13 DIAGNOSIS — Z885 Allergy status to narcotic agent status: Secondary | ICD-10-CM | POA: Diagnosis not present

## 2020-07-13 DIAGNOSIS — Z7185 Encounter for immunization safety counseling: Secondary | ICD-10-CM | POA: Insufficient documentation

## 2020-07-13 DIAGNOSIS — Z887 Allergy status to serum and vaccine status: Secondary | ICD-10-CM | POA: Insufficient documentation

## 2020-07-13 DIAGNOSIS — I5022 Chronic systolic (congestive) heart failure: Secondary | ICD-10-CM | POA: Diagnosis not present

## 2020-07-13 DIAGNOSIS — I110 Hypertensive heart disease with heart failure: Secondary | ICD-10-CM | POA: Diagnosis not present

## 2020-07-13 DIAGNOSIS — F419 Anxiety disorder, unspecified: Secondary | ICD-10-CM | POA: Diagnosis not present

## 2020-07-13 DIAGNOSIS — I10 Essential (primary) hypertension: Secondary | ICD-10-CM

## 2020-07-13 DIAGNOSIS — K219 Gastro-esophageal reflux disease without esophagitis: Secondary | ICD-10-CM | POA: Insufficient documentation

## 2020-07-13 DIAGNOSIS — Z886 Allergy status to analgesic agent status: Secondary | ICD-10-CM | POA: Insufficient documentation

## 2020-07-13 DIAGNOSIS — E119 Type 2 diabetes mellitus without complications: Secondary | ICD-10-CM | POA: Diagnosis not present

## 2020-07-13 DIAGNOSIS — Z9884 Bariatric surgery status: Secondary | ICD-10-CM | POA: Diagnosis not present

## 2020-07-13 DIAGNOSIS — Z7984 Long term (current) use of oral hypoglycemic drugs: Secondary | ICD-10-CM | POA: Insufficient documentation

## 2020-07-13 DIAGNOSIS — Z7901 Long term (current) use of anticoagulants: Secondary | ICD-10-CM | POA: Insufficient documentation

## 2020-07-13 MED ORDER — SACUBITRIL-VALSARTAN 49-51 MG PO TABS: 1.0000 | ORAL_TABLET | Freq: Two times a day (BID) | ORAL | 3 refills | Status: DC

## 2020-07-13 NOTE — Patient Instructions (Addendum)
Continue weighing daily and call for an overnight weight gain of > 2 pounds or a weekly weight gain of >5 pounds.   Double your current entresto dose by taking 2 tablets every morning and 2 tablets every evening. When you get your new bottle of entresto (49/51mg ) you will then resume taking 1 tablet twice daily

## 2020-08-24 ENCOUNTER — Ambulatory Visit: Payer: Managed Care, Other (non HMO) | Admitting: Family

## 2020-09-11 ENCOUNTER — Other Ambulatory Visit: Payer: Self-pay | Admitting: Family

## 2020-09-11 MED ORDER — CARVEDILOL 6.25 MG PO TABS: 6.2500 mg | ORAL_TABLET | Freq: Two times a day (BID) | ORAL | 3 refills | Status: DC

## 2020-09-24 IMAGING — CT CT ANGIO CHEST
2 of 6 series · 18 of 46 positions shown · IV contrast (APPLIED)
Comparison: None.

CLINICAL DATA: Recent COVID vaccine. Labored breathing. Pleuritic
chest pain, shortness of breath.

EXAM:
CT ANGIOGRAPHY CHEST WITH CONTRAST
TECHNIQUE: Multidetector CT imaging of the chest was performed using the
standard protocol during bolus administration of intravenous
contrast. Multiplanar CT image reconstructions and MIPs were
obtained to evaluate the vascular anatomy.
CONTRAST:  75mL OMNIPAQUE IOHEXOL 350 MG/ML SOLN

[Series 5: thins · axial · 0.90mm/px · z∈[-168,+146]mm · 16 of 344 slices shown]
[im 15/344  lung]
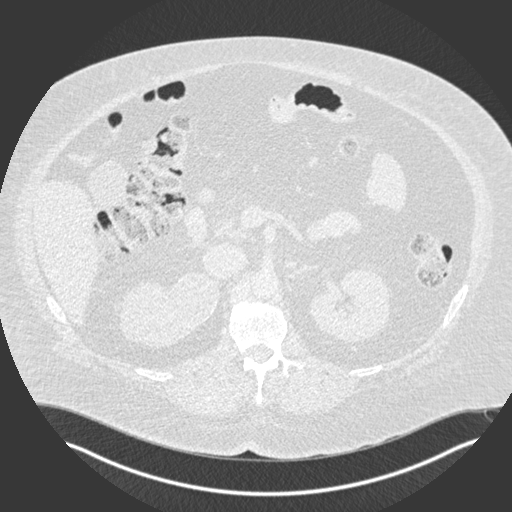
[im 45/344  soft-tissue]
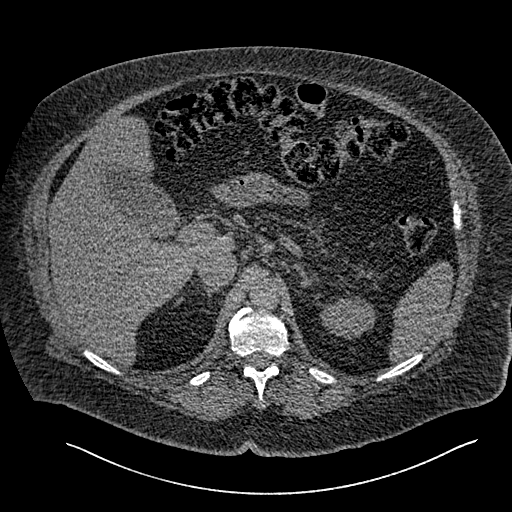
[im 60/344  lung]
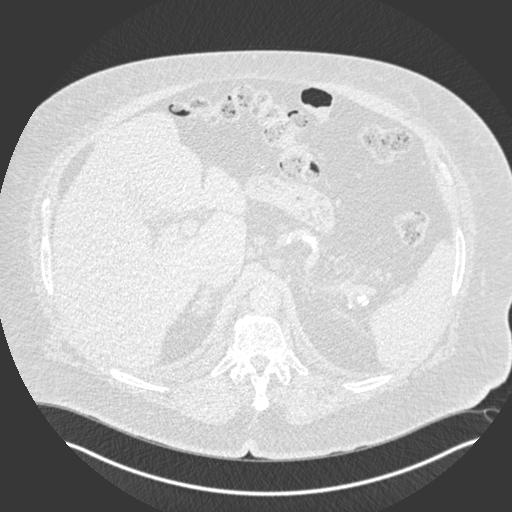
[im 75/344  soft-tissue]
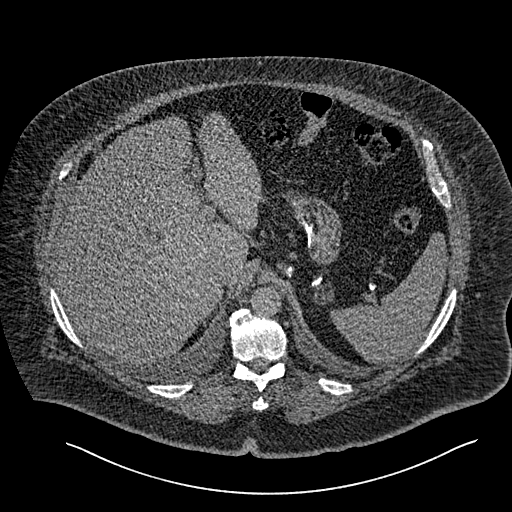
[im 105/344  lung]
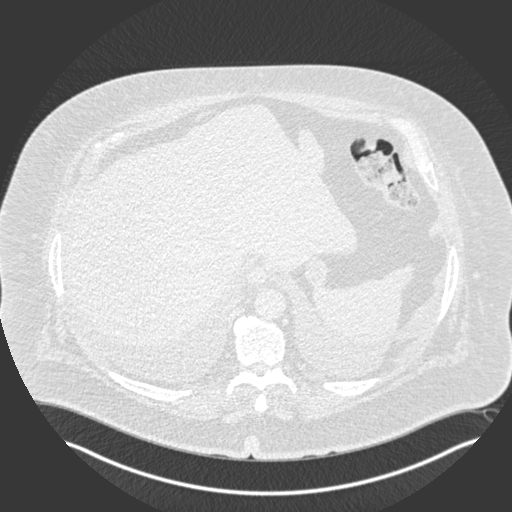
[im 120/344  soft-tissue]
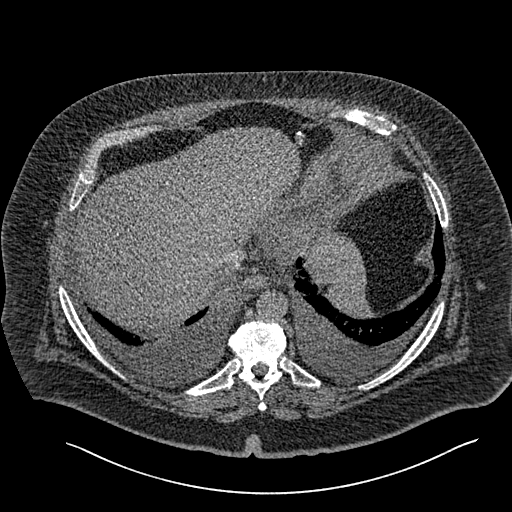
[im 135/344  lung]
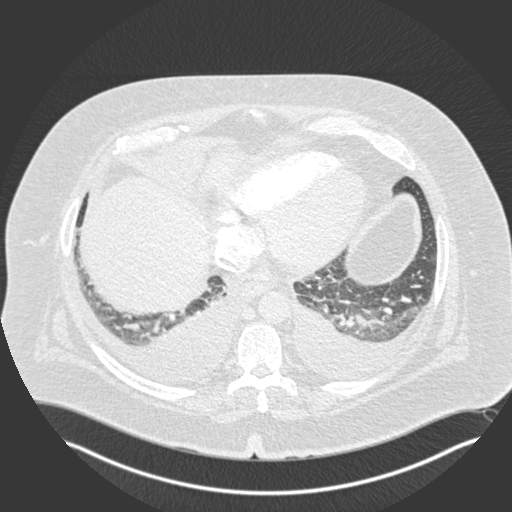
[im 165/344  soft-tissue]
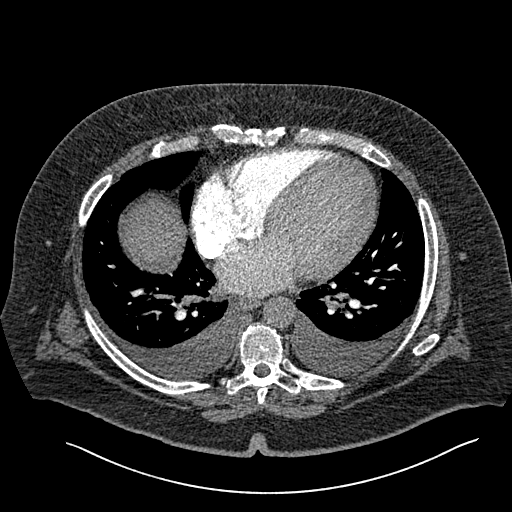
[im 179/344  lung]
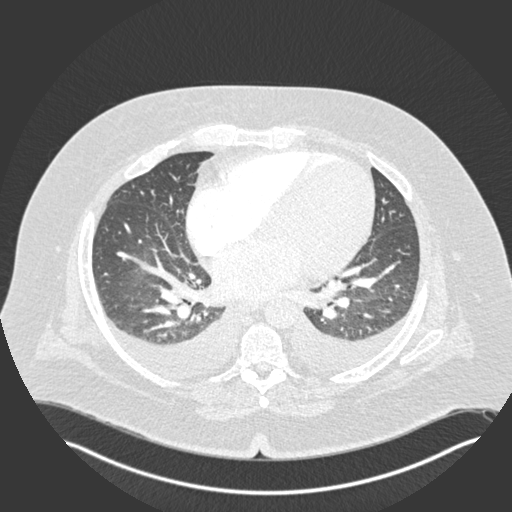
[im 209/344  soft-tissue]
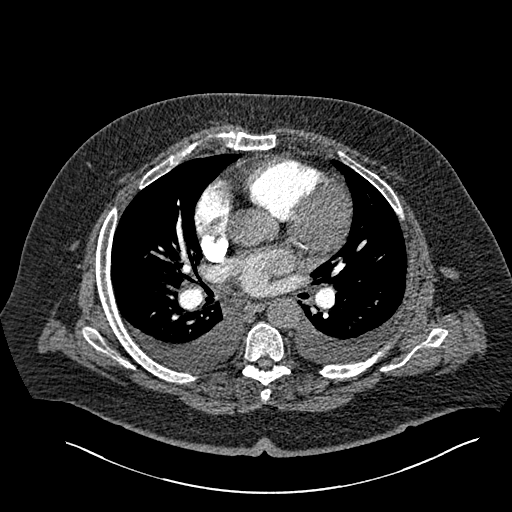
[im 224/344  lung]
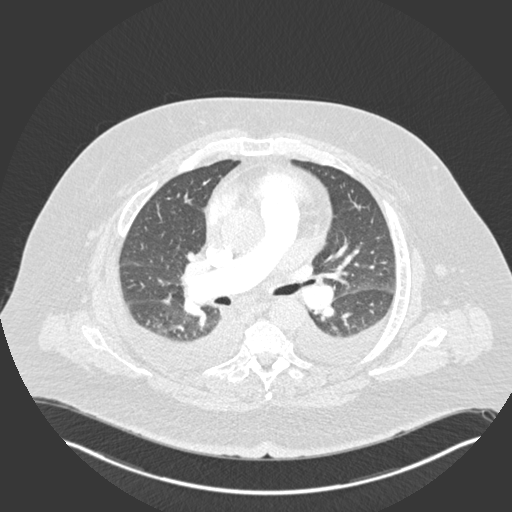
[im 239/344  soft-tissue]
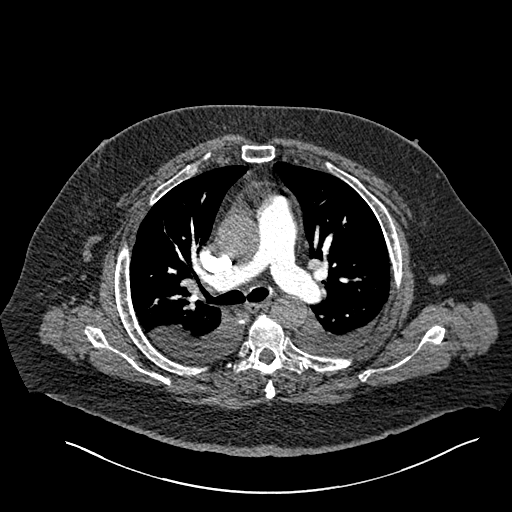
[im 269/344  lung]
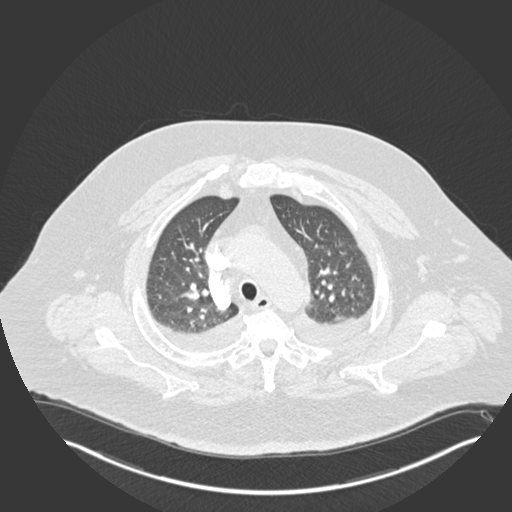
[im 284/344  soft-tissue]
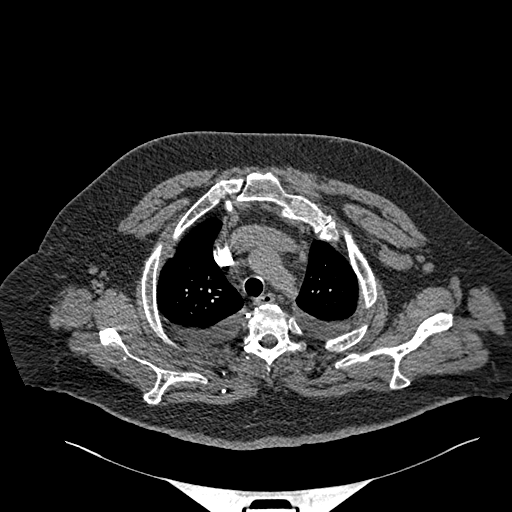
[im 299/344  lung]
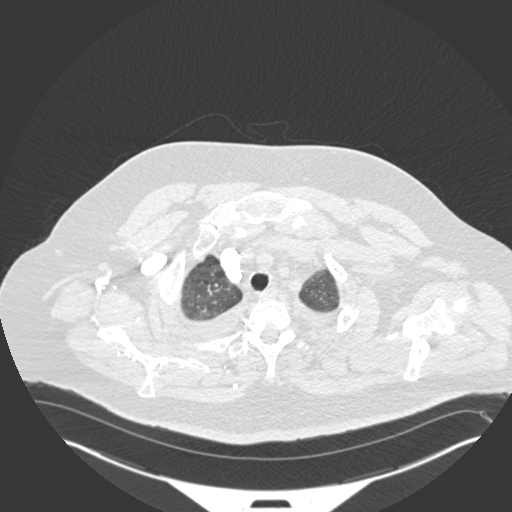
[im 329/344  soft-tissue]
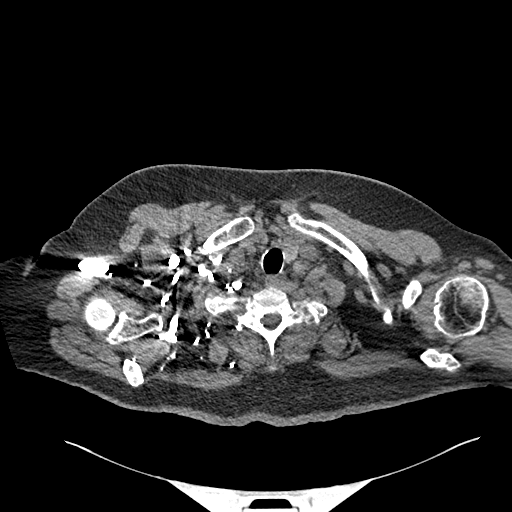

[Series 7: coronal mpr · coronal · 0.70mm/px · 2 of 108 slices shown]
[im 36/108  soft-tissue]
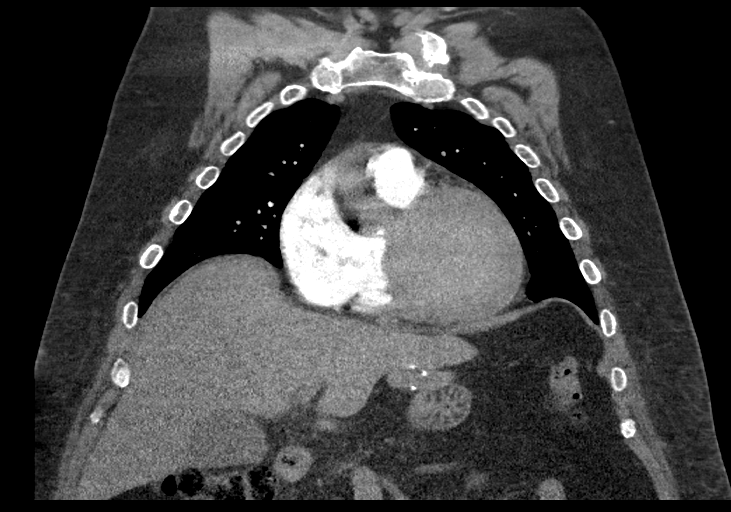
[im 72/108  soft-tissue]
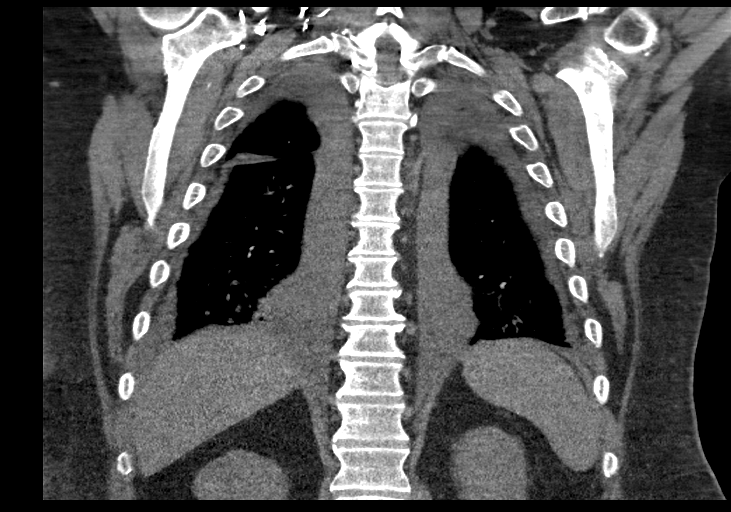

[18 of 46 positions shown; findings below may reference images not displayed]

FINDINGS: Cardiovascular: No filling defects in the pulmonary arteries to
suggest pulmonary emboli. Heart is mildly enlarged. Coronary artery
and aortic calcifications. No aneurysm.

Mediastinum/Nodes: No mediastinal, hilar, or axillary adenopathy.
Trachea and esophagus are unremarkable. Thyroid unremarkable.

Lungs/Pleura: Moderate bilateral pleural effusions. Dependent and
bibasilar ground-glass atelectasis. No confluent opacities
otherwise.

Upper Abdomen: Imaging into the upper abdomen shows no acute
findings. Rim calcified mass off the upper pole of the right kidney
measures 4.6 cm.

Musculoskeletal: Chest wall soft tissues are unremarkable. No acute
bony abnormality.

Review of the MIP images confirms the above findings.
IMPRESSION: No evidence of pulmonary embolus.

Moderate bilateral pleural effusions. Dependent and bibasilar
atelectasis.

4.6 cm rim calcified mass off the upper pole of the right kidney.
This could be further evaluated with elective renal ultrasound or
abdominal CT/MRI.

Coronary artery disease, aortic atherosclerosis.

## 2020-09-25 IMAGING — CR DG CHEST 2V
2 series · 2 of 2 positions shown · non-contrast
Comparison: 11/24/2019

CLINICAL DATA: Short of breath.

EXAM:
CHEST - 2 VIEW

[chest pa]
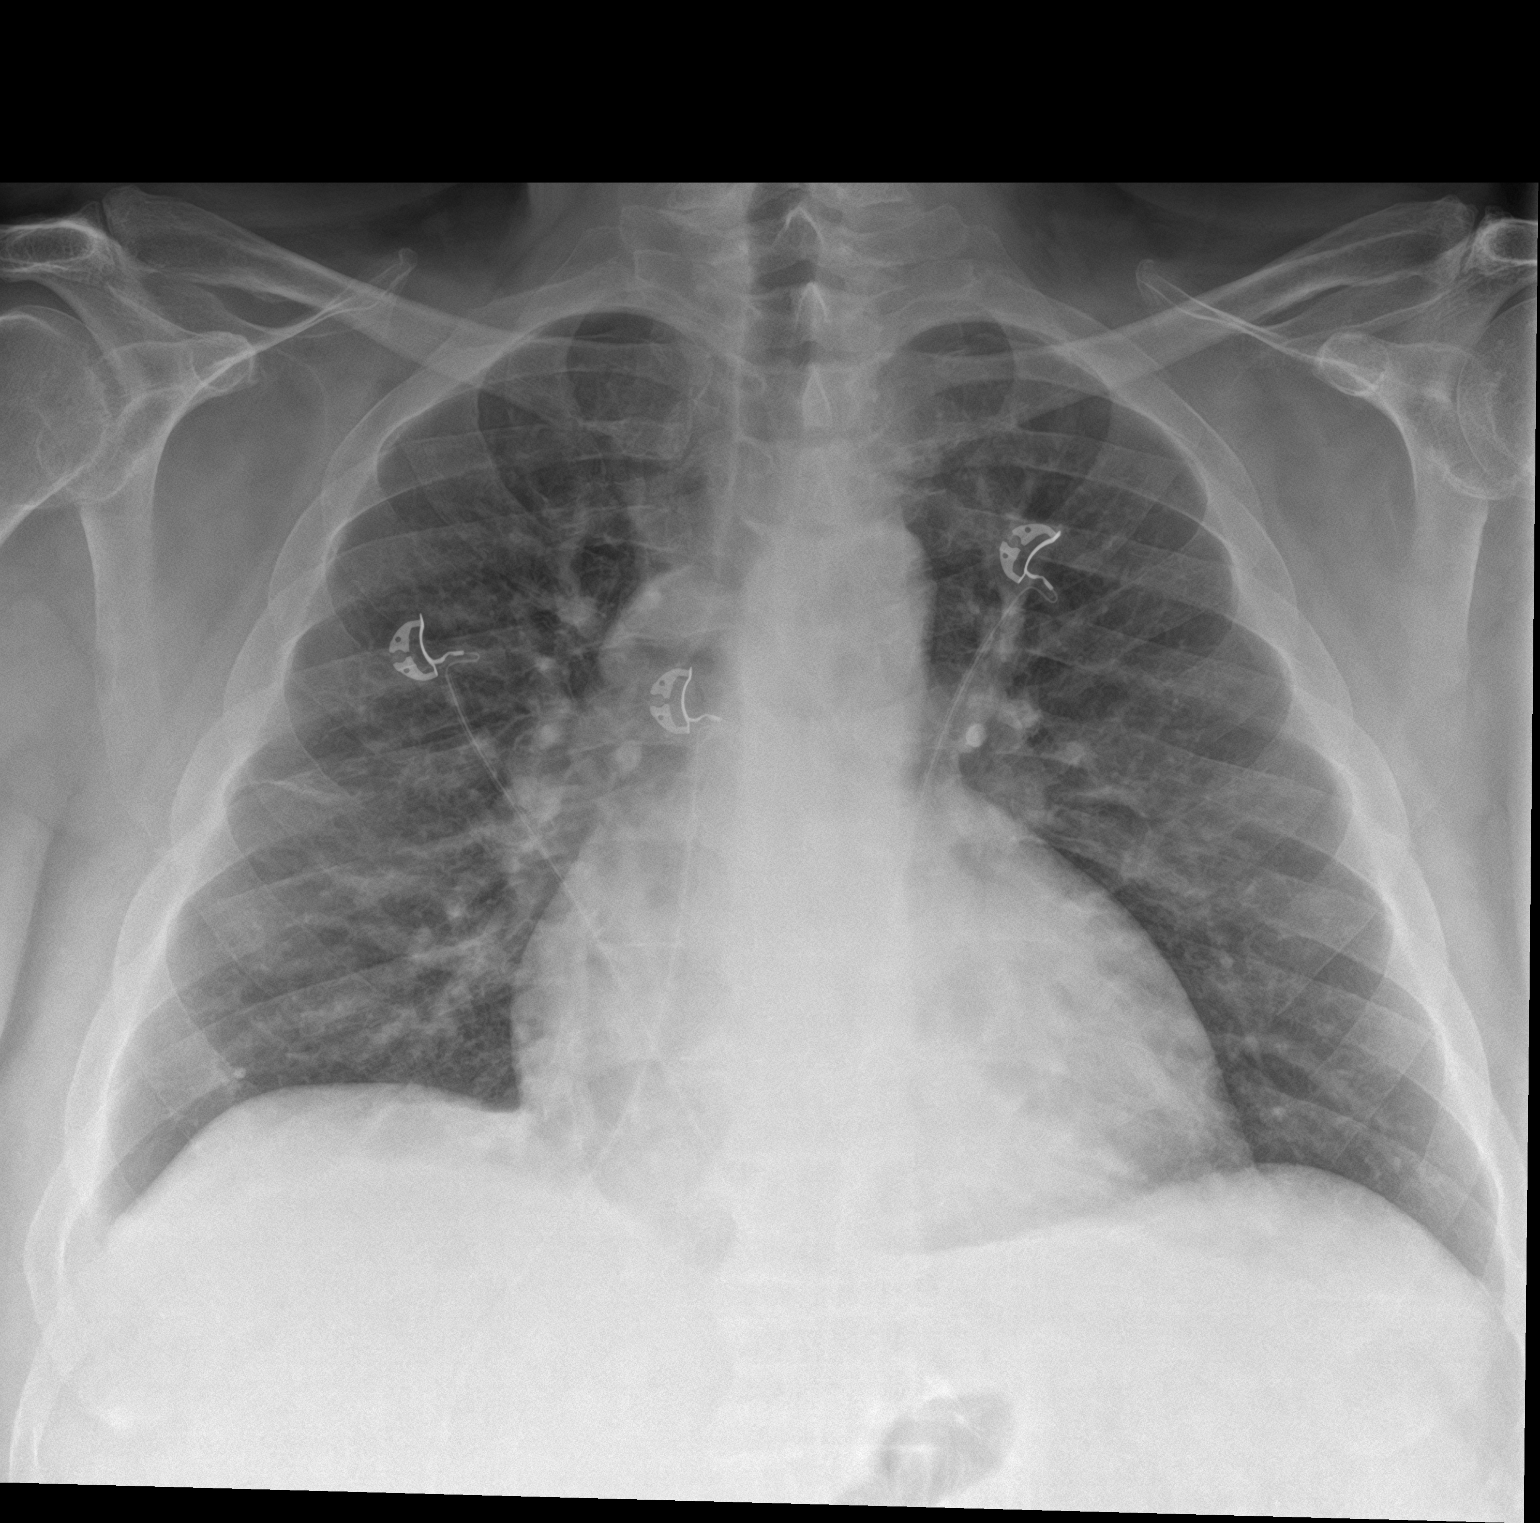

[chest lat]
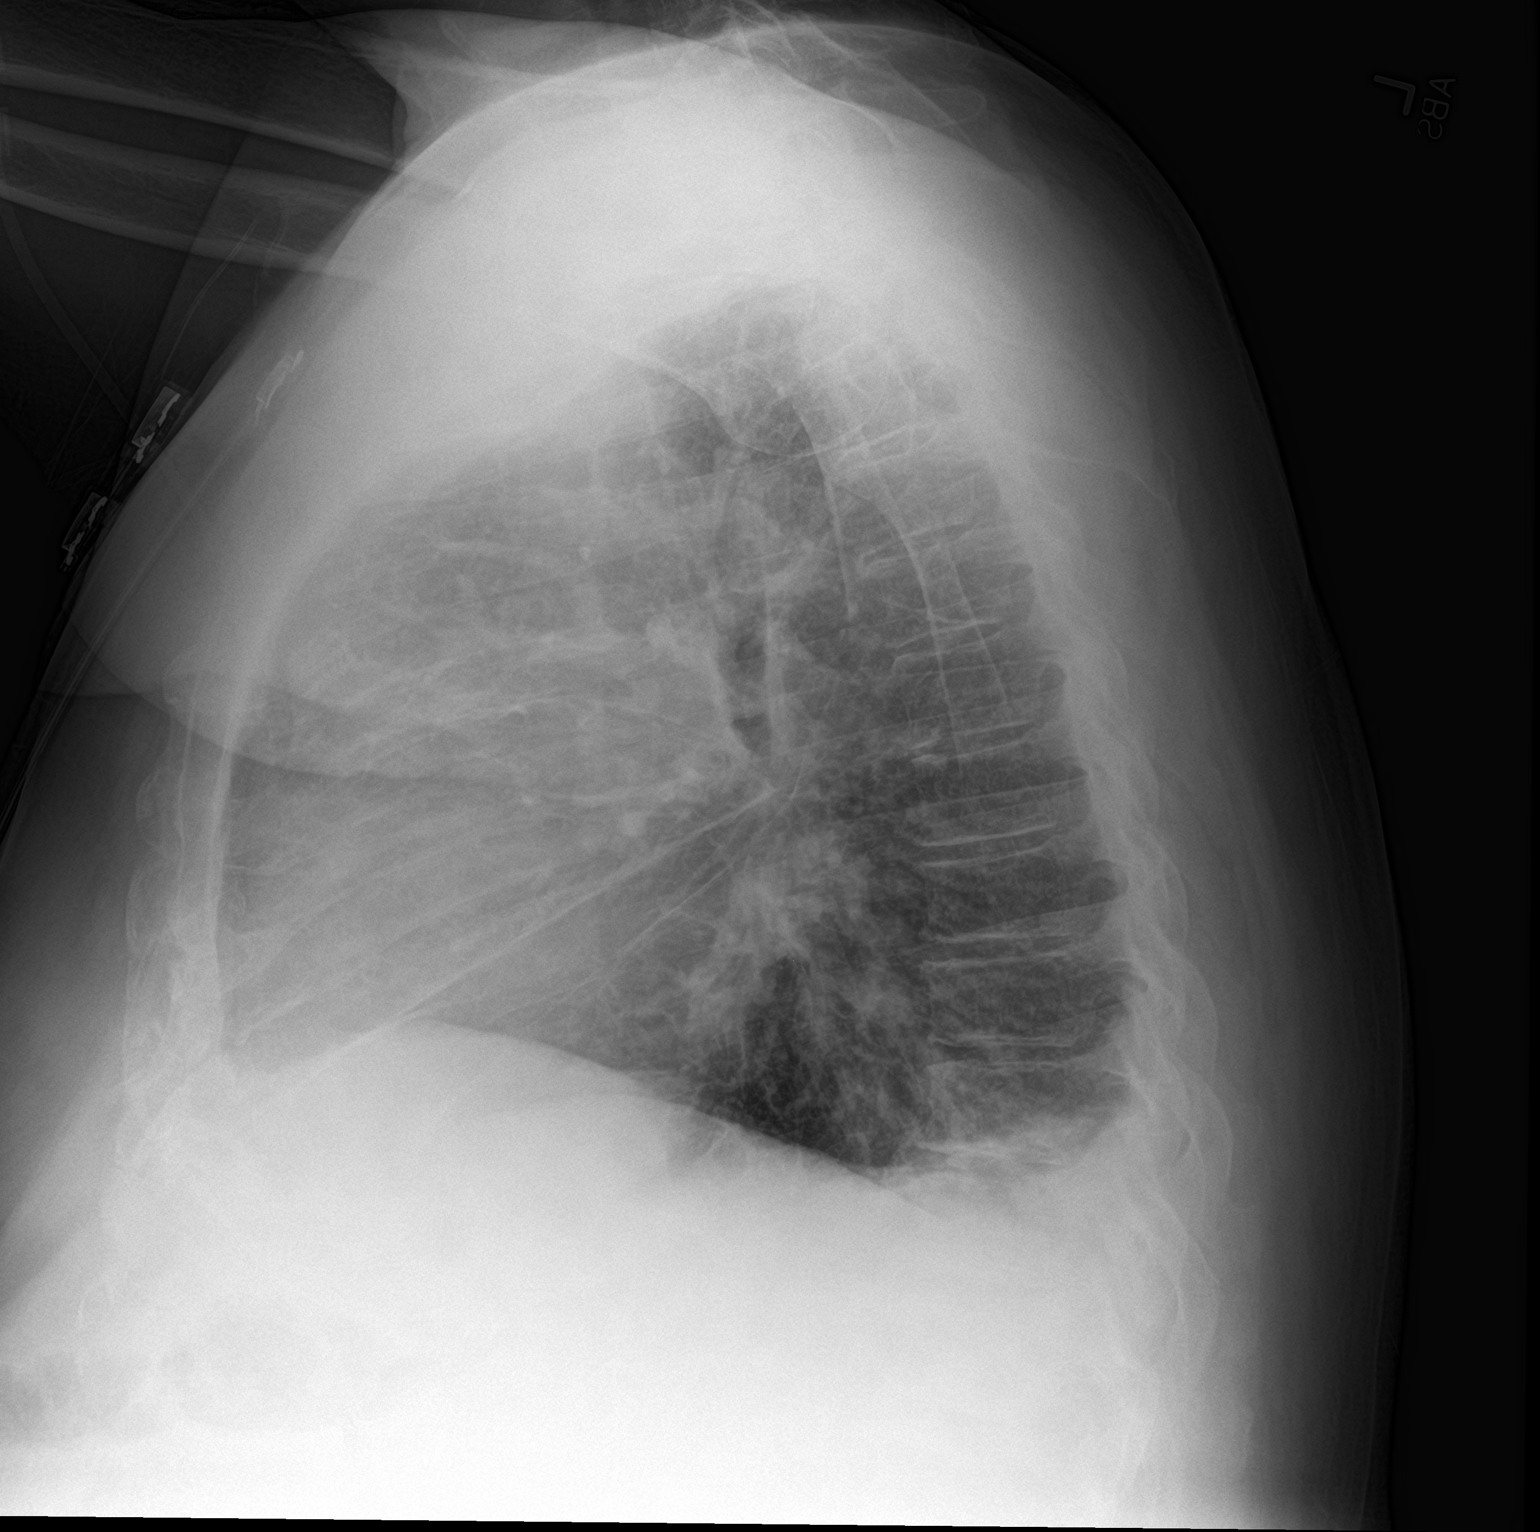

[2 of 2 positions shown; findings below may reference images not displayed]

FINDINGS: Mild cardiac enlargement. Small bilateral pleural effusions and
pulmonary vascular congestion are again seen. No airspace
consolidation. Visualized osseous structures unremarkable.
IMPRESSION: 1. Mild CHF.  Unchanged.

## 2020-09-27 ENCOUNTER — Ambulatory Visit: Payer: Managed Care, Other (non HMO) | Admitting: Family

## 2020-11-02 ENCOUNTER — Other Ambulatory Visit: Payer: Self-pay

## 2020-11-02 ENCOUNTER — Encounter: Payer: Self-pay | Admitting: Family

## 2020-11-02 ENCOUNTER — Ambulatory Visit: Payer: Managed Care, Other (non HMO) | Attending: Family | Admitting: Family

## 2020-11-02 VITALS — BP 131/86 | HR 41 | Resp 18 | Ht 71.0 in | Wt 330.1 lb

## 2020-11-02 DIAGNOSIS — E119 Type 2 diabetes mellitus without complications: Secondary | ICD-10-CM | POA: Diagnosis not present

## 2020-11-02 DIAGNOSIS — Z7984 Long term (current) use of oral hypoglycemic drugs: Secondary | ICD-10-CM | POA: Insufficient documentation

## 2020-11-02 DIAGNOSIS — Z7901 Long term (current) use of anticoagulants: Secondary | ICD-10-CM | POA: Diagnosis not present

## 2020-11-02 DIAGNOSIS — Z9884 Bariatric surgery status: Secondary | ICD-10-CM | POA: Diagnosis not present

## 2020-11-02 DIAGNOSIS — I509 Heart failure, unspecified: Secondary | ICD-10-CM | POA: Insufficient documentation

## 2020-11-02 DIAGNOSIS — I10 Essential (primary) hypertension: Secondary | ICD-10-CM

## 2020-11-02 DIAGNOSIS — I110 Hypertensive heart disease with heart failure: Secondary | ICD-10-CM | POA: Insufficient documentation

## 2020-11-02 DIAGNOSIS — I5022 Chronic systolic (congestive) heart failure: Secondary | ICD-10-CM

## 2020-11-02 NOTE — Patient Instructions (Addendum)
Continue weighing daily and call for an overnight weight gain of > 2 pounds or a weekly weight gain of >5 pounds.   Call us after your echo is done to schedule an appointment

## 2020-11-02 NOTE — Progress Notes (Signed)
Patient ID: William William [name]., male    DOB: 12/14/1955, 65 y.o.   MRN: [id number]  HPI  William William [name] is William [name] 65 y/o male with William [name] history of DM, HTN, GERD, PUD, anxiety, anemia, gastric bypass and heart failure.   Echo report from 11/26/19 reviewed and showed an EF of <20% along with William.   Has not been admitted or been in the ED in the last 6 months.   He presents today for William [name] follow-up visit with William [name] chief complaint of minimal fatigue upon moderate exertion. He describes this has chronic in nature having been present for about the last year. He has associated anxiety and gradual weight gain along with this. He denies any abdominal distention, palpitations, pedal edema, chest pain, wheezing, shortness of breath, cough or dizziness.   Did receive his Moderna covid booster without any issues or complications. Has echo scheduled for 01/12/21  Past Medical History:  Diagnosis Date  . Anxiety   . 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 CHF (congestive heart failure) ([place])   . Diabetes mellitus without complication ([place])   . GERD (gastroesophageal reflux disease)   . Hypertension   . Iron deficiency anemia   . Obesity   . Osteoarthritis of hip    right  . Peptic ulcer disease    Past Surgical History:  Procedure Laterality Date  . CARDIOVERSION N/William [name] 11/26/2019   Procedure: CARDIOVERSION;  Surgeon: William William [name], William William [name];  Location: [facility] ORS;  Service: Cardiovascular;  Laterality: N/William [name];  . COLONOSCOPY    . ESOPHAGOGASTRODUODENOSCOPY    . GASTRIC BYPASS     2009  . NASAL SINUS SURGERY    . TOTAL HIP ARTHROPLASTY Right 08/20/2016   Procedure: TOTAL HIP ARTHROPLASTY ANTERIOR APPROACH;  Surgeon: William William [name], William William [name];  Location: [facility] ORS;  Service: Orthopedics;  Laterality: Right;   Family History  Problem Relation Age of Onset  . Dementia Mother   . Diabetes Father    Social History   Tobacco Use  . Smoking status: Never Smoker  . Smokeless tobacco: Never Used  Substance Use Topics  . Alcohol use: No    Allergies  Allergen Reactions  . Morphine And Related Hives  . Nsaids Other (See Comments)    Gastric bypass  . Opium Hives    Opiate drip when had gastric bypass drug unknown (morphine drip)   Prior to Admission medications   Medication Sig Start Date End Date Taking? Authorizing William William [name]  amiodarone (PACERONE) 200 MG tablet Take 1 tablet (200 mg total) by mouth 2 (two) times daily. Patient taking differently: Take 200 mg by mouth daily. 11/27/19  Yes William William [name], William William [name]  apixaban (ELIQUIS) 5 MG TABS tablet Take 1 tablet (5 mg total) by mouth 2 (two) times daily. 11/27/19  Yes William William [name], William William [name]  carvedilol (COREG) 6.25 MG tablet Take 1 tablet (6.25 mg total) by mouth 2 (two) times daily. 09/11/20  Yes William William [name], William William [name]  Cyanocobalamin (B-12) 2500 MCG TABS Take 5,000 mcg by mouth daily.    Yes William William [name], Historical, William William [name]  ferrous sulfate 325 (65 FE) MG tablet Take 1 tablet (325 mg total) by mouth daily. 11/28/19  Yes William William [name], William William [name]  FLUoxetine (PROZAC) 20 MG capsule Take 20 mg by mouth every evening.  07/20/16  Yes William William [name], Historical, William William [name]  furosemide (LASIX) 20 MG tablet Take 0.5 tablets (10 mg total) by mouth daily as needed for fluid (SOB or weight is up by 3lbs). Patient taking  differently: Take 40 mg by mouth daily. 11/27/19  Yes William William [name], William William [name]  JARDIANCE 25 MG TABS tablet Take 25 mg by mouth daily. 11/02/19  Yes William William [name], Historical, William William [name]  metFORMIN (GLUCOPHAGE) 1000 MG tablet Take 1,000 mg by mouth 2 (two) times daily with William [name] meal.    Yes William William [name], Historical, William William [name]  Multiple Vitamin (MULTIVITAMIN WITH MINERALS) TABS tablet Take 1 tablet by mouth daily.   Yes William William [name], Historical, William William [name]  pantoprazole (PROTONIX) 40 MG tablet Take 40 mg by mouth every evening.    Yes William William [name], Historical, William William [name]  pravastatin (PRAVACHOL) 40 MG tablet Take 40 mg by mouth at bedtime.  09/22/19  Yes William William [name], Historical, William William [name]  sacubitril-valsartan (ENTRESTO) 24-26 MG Take 1 tablet by mouth 2 (two) times daily.   Yes William William [name],  Historical, William William [name]  sildenafil (VIAGRA) 25 MG tablet Take 25 mg by mouth as needed for erectile dysfunction.   Yes William William [name], Historical, William William [name]    Review of Systems  Constitutional: Positive for fatigue (very little). Negative for appetite change.  HENT: Negative for congestion, postnasal drip and sore throat.   Eyes: Negative.   Respiratory: Negative for cough, chest tightness, shortness of breath and wheezing.   Cardiovascular: Negative for chest pain, palpitations and leg swelling.  Gastrointestinal: Negative for abdominal distention and abdominal pain.  Endocrine: Negative.   Genitourinary: Negative.   Musculoskeletal: Negative for back pain and neck pain.  Skin: Negative.   Allergic/Immunologic: Negative.   Neurological: Negative for dizziness and light-headedness.  Hematological: Negative for adenopathy. Does not bruise/bleed easily.  Psychiatric/Behavioral: Negative for dysphoric mood and sleep disturbance (sleeping on 1 pillow). The patient is nervous/anxious.    Vitals:   11/02/20 1143  BP: 131/86  Pulse: (!) 41  Resp: 18  SpO2: 98%  Weight: (!) 330 lb 2 oz (149.7 kg)  Height: 5\' 11"  (1.803 m)   Wt Readings from Last 3 Encounters:  11/02/20 (!) 330 lb 2 oz (149.7 kg)  07/13/20 (!) 324 lb (147 kg)  04/12/20 (!) 323 lb 6 oz (146.7 kg)   Lab Results  Component Value Date   CREATININE 0.92 01/28/2020   CREATININE 1.04 12/17/2019   CREATININE 0.84 11/27/2019    Physical Exam Vitals and nursing note reviewed.  Constitutional:      Appearance: Normal appearance.  HENT:     Head: Normocephalic and atraumatic.  Cardiovascular:     Rate and Rhythm: Regular rhythm. Bradycardia present.  Pulmonary:     Effort: Pulmonary effort is normal. No respiratory distress.     Breath sounds: No wheezing or rales.  Abdominal:     General: Abdomen is flat. There is no distension.     Palpations: Abdomen is soft.  Musculoskeletal:        General: No tenderness.     Cervical back:  Normal range of motion and neck supple.     Right lower leg: No edema.     Left lower leg: No edema.  Skin:    General: Skin is warm and dry.  Neurological:     General: No focal deficit present.     Mental Status: He is alert and oriented to person, place, and time.  Psychiatric:        Mood and Affect: Mood is anxious.        Behavior: Behavior normal.        Thought Content: Thought content normal.      Assessment & Plan:  1: Chronic heart failure with reduced ejection fraction- - NYHA  II - euvolemic today - weighing daily; reminded to call for an overnight weight gain of >2 pounds or William [name] weekly weight gain of >5 pounds - weight up 6 pounds from last visit here 4 months ago - not adding salt and has been trying to keep daily sodium intake to 2000mg / day - HR in the 60's at home per review of his log so unable to titrate carvedilol - home BP ranges from 103-126/ 62-77 so may not be able to titrate entresto further - on jardiance; consider adding spironolactone after echo - saw cardiology William William [name]) 10/27/20 & had echo scheduled 01/12/21 - BNP 11/25/19 was 806.0 - had [employer] COVID vaccine March 2021 with severe reaction afterwards resulting in hospitalization; patient has since received Moderna booster and reports not having any issues with the booster  2: HTN- - BP looks good today - had telemedicine with PCP William William [name]) 11/19/19 - BMP 12/29/19 reviewed and showed sodium 139, potassium 4.5, creatinine 0.92 and GFR >60  3: DM- - A1c 11/12/19 was 8.2% - glucose at home today was 139 - had gastric bypass in 2009 when he weighed in the 500's   Medication bottles were reviewed.   Patient to call after he sees William William [name] after his repeat echo in May. He will schedule an appointment at that time.

## 2020-11-03 ENCOUNTER — Encounter: Payer: Self-pay | Admitting: Family

## 2021-03-27 ENCOUNTER — Other Ambulatory Visit: Payer: Self-pay | Admitting: Family

## 2021-06-18 ENCOUNTER — Encounter: Payer: Self-pay | Admitting: Family

## 2021-06-18 ENCOUNTER — Other Ambulatory Visit: Payer: Self-pay | Admitting: Family

## 2021-06-18 MED ORDER — APIXABAN 5 MG PO TABS: 5.0000 mg | ORAL_TABLET | Freq: Two times a day (BID) | ORAL | 3 refills | Status: DC

## 2021-07-02 ENCOUNTER — Ambulatory Visit: Payer: Managed Care, Other (non HMO) | Admitting: Family

## 2021-07-26 NOTE — Progress Notes (Signed)
Patient ID: William [name]., male    DOB: 1956/07/06, 65 y.o.   MRN: [id number]  HPI  [name] is a 65 y/o male with a history of DM, HTN, GERD, PUD, anxiety, anemia, gastric bypass and heart failure.   Echo report from 01/23/21 reviewed and showed an EF of 45% along with LAE. Echo report from 11/26/19 reviewed and showed an EF of <20% along with MR.   Has not been admitted or been in the ED in the last 6 months.   He presents today for a follow-up visit with a chief complaint of minimal fatigue upon moderate exertion. He describes this as chronic in nature having been present for several years. He has no other symptoms and specifically denies any difficulty sleeping, dizziness, abdominal distention, palpitations, pedal edema, chest pain, shortness of breath, cough or weight gain.   Has been under some stress due to his mom passing away within the last year and his job. Will be turning 66 next year and admits that it makes him nervous thinking about picking out a medicare plan.  He says that he may want to change his general cardiology care to [facility] as he doesn't like his current cardiology provider. Says that he was never called regarding his echo results that were obtained back in May.   Has noticed some UTI symptoms since being started on Jardiance although he says that his symptoms have improved since he started taking D-mannose.   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)    Diabetes mellitus without complication ([facility])    GERD (gastroesophageal reflux disease)    Hypertension    Iron deficiency anemia    Obesity    Osteoarthritis of hip    right   Peptic ulcer disease    Past Surgical History:  Procedure Laterality Date   CARDIOVERSION N/A 11/26/2019   Procedure: CARDIOVERSION;  Surgeon: Teodoro Spray, MD;  Location: [facility] ORS;  Service: Cardiovascular;  Laterality: N/A;   COLONOSCOPY      ESOPHAGOGASTRODUODENOSCOPY     GASTRIC BYPASS     2009   NASAL SINUS SURGERY     TOTAL HIP ARTHROPLASTY Right 08/20/2016   Procedure: TOTAL HIP ARTHROPLASTY ANTERIOR APPROACH;  Surgeon: Hessie Knows, MD;  Location: [facility] ORS;  Service: Orthopedics;  Laterality: Right;   Family History  Problem Relation Age of Onset   Dementia Mother    Diabetes Father    Social History   Tobacco Use   Smoking status: Never   Smokeless tobacco: Never  Substance Use Topics   Alcohol use: No   Allergies  Allergen Reactions   Morphine And Related Hives   Nsaids Other (See Comments)    Gastric bypass   Opium Hives    Opiate drip when had gastric bypass drug unknown (morphine drip)   Prior to Admission medications   Medication Sig Start Date End Date Taking? Authorizing Provider  amiodarone (PACERONE) 200 MG tablet Take 1 tablet (200 mg total) by mouth 2 (two) times daily. Patient taking differently: Take 200 mg by mouth daily. 11/27/19  Yes Nolberto Hanlon, MD  apixaban (ELIQUIS) 5 MG TABS tablet Take 1 tablet (5 mg total) by mouth 2 (two) times daily. 06/18/21  Yes Darylene Price A, FNP  carvedilol (COREG) 6.25 MG tablet TAKE 1 TABLET BY MOUTH  TWICE DAILY 06/18/21  Yes Darylene Price  A, FNP  Cyanocobalamin (B-12) 2500 MCG TABS Take 5,000 mcg by mouth daily.    Yes [provider]  D-Mannose 500 MG CAPS Take 2 mg by mouth.   Yes [provider]  ferrous sulfate 325 (65 FE) MG tablet Take 1 tablet (325 mg total) by mouth daily. 11/28/19  Yes Nolberto Hanlon, MD  FLUoxetine (PROZAC) 20 MG capsule Take 20 mg by mouth every evening.  07/20/16  Yes [provider]  furosemide (LASIX) 20 MG tablet Take 0.5 tablets (10 mg total) by mouth daily as needed for fluid (SOB or weight is up by 3lbs). Patient taking differently: Take 40 mg by mouth daily. 11/27/19  Yes Nolberto Hanlon, MD  JARDIANCE 25 MG TABS tablet Take 25 mg by mouth daily. 11/02/19  Yes [provider]  metFORMIN  (GLUCOPHAGE) 1000 MG tablet Take 1,000 mg by mouth 2 (two) times daily with a meal.    Yes [provider]  Multiple Vitamin (MULTIVITAMIN WITH MINERALS) TABS tablet Take 1 tablet by mouth daily.   Yes [provider]  pantoprazole (PROTONIX) 40 MG tablet Take 40 mg by mouth every evening.    Yes [provider]  pravastatin (PRAVACHOL) 40 MG tablet Take 40 mg by mouth at bedtime.  09/22/19  Yes [provider]  sacubitril-valsartan (ENTRESTO) 24-26 MG Take 1 tablet by mouth 2 (two) times daily.   Yes [provider]  sildenafil (VIAGRA) 25 MG tablet Take 25 mg by mouth as needed for erectile dysfunction. Patient not taking: Reported on 07/27/2021    [provider]   Review of Systems  Constitutional:  Positive for fatigue (very little). Negative for appetite change.  HENT:  Negative for congestion, postnasal drip and sore throat.   Eyes: Negative.   Respiratory:  Negative for cough, chest tightness, shortness of breath and wheezing.   Cardiovascular:  Negative for chest pain, palpitations and leg swelling.  Gastrointestinal:  Negative for abdominal distention and abdominal pain.  Endocrine: Negative.   Genitourinary: Negative.   Musculoskeletal:  Negative for back pain and neck pain.  Skin: Negative.   Allergic/Immunologic: Negative.   Neurological:  Negative for dizziness and light-headedness.  Hematological:  Negative for adenopathy. Does not bruise/bleed easily.  Psychiatric/Behavioral:  Negative for dysphoric mood and sleep disturbance (sleeping on 1 pillow). The patient is nervous/anxious.    Vitals:   07/27/21 0833  BP: 113/67  Pulse: 71  Resp: 18  SpO2: 100%  Weight: (!) 320 lb 8 oz (145.4 kg)  Height: 5\' 11"  (1.803 m)   Wt Readings from Last 3 Encounters:  07/27/21 (!) 320 lb 8 oz (145.4 kg)  11/02/20 (!) 330 lb 2 oz (149.7 kg)  07/13/20 (!) 324 lb (147 kg)   Lab Results  Component Value Date   CREATININE 0.92  01/28/2020   CREATININE 1.04 12/17/2019   CREATININE 0.84 11/27/2019   Physical Exam Vitals and nursing note reviewed.  Constitutional:      Appearance: Normal appearance.  HENT:     Head: Normocephalic and atraumatic.  Cardiovascular:     Rate and Rhythm: Normal rate and regular rhythm.  Pulmonary:     Effort: Pulmonary effort is normal. No respiratory distress.     Breath sounds: No wheezing or rales.  Abdominal:     General: Abdomen is flat. There is no distension.     Palpations: Abdomen is soft.  Musculoskeletal:        General: No tenderness.  Cervical back: Normal range of motion and neck supple.     Right lower leg: No edema.     Left lower leg: No edema.  Skin:    General: Skin is warm and dry.  Neurological:     General: No focal deficit present.     Mental Status: He is alert and oriented to person, place, and time.  Psychiatric:        Mood and Affect: Mood is anxious.        Behavior: Behavior normal.        Thought Content: Thought content normal.   Assessment & Plan:  1: Chronic heart failure with mildly reduced ejection fraction- - NYHA II - euvolemic today - weighing daily; reminded to call for an overnight weight gain of >2 pounds or a weekly weight gain of >5 pounds - weight down almost 10 pounds from last visit here 9 months ago; has been watching his diet more closely and trying to increase his activity - not adding salt and has been trying to keep daily sodium intake to 2000mg / day - saw cardiology Nehemiah Massed) 10/27/20; patient says that he may want to be referred to [facility] but he will let us know - on GDMT of carvedilol, jardiance and entresto - discussed adding spironolactone or titrating up entresto  - most recent echo shows improved EF although still reduced - will get labs today and if potassium/ renal function look good, will add spironolactone 25mg  daily - only taking his furosemide if he needs it - BNP 11/25/19 was 806.0  2: HTN- - BP  looks good (113/67) - had telemedicine with PCP Caryl Comes) 11/19/19 - BMP 12/29/19 reviewed and showed sodium 139, potassium 4.5, creatinine 0.92 and GFR >60  3: DM- - A1c 11/12/19 was 8.2% - glucose at home today was 140 - had gastric bypass in 2009 when he weighed in the 500's  LABS: labs resulted prior to note being signed and finished. Will add spironolactone 25mg  daily and get labs drawn 08/06/21 and then again in 1 month at his next visit. RN called patient to inform.    Medication bottles were reviewed.

## 2021-07-27 ENCOUNTER — Telehealth: Payer: Self-pay

## 2021-07-27 ENCOUNTER — Ambulatory Visit (HOSPITAL_BASED_OUTPATIENT_CLINIC_OR_DEPARTMENT_OTHER): Payer: Managed Care, Other (non HMO) | Admitting: Family

## 2021-07-27 ENCOUNTER — Encounter: Payer: Self-pay | Admitting: Family

## 2021-07-27 ENCOUNTER — Other Ambulatory Visit: Payer: Self-pay

## 2021-07-27 ENCOUNTER — Other Ambulatory Visit
Admission: RE | Admit: 2021-07-27 | Discharge: 2021-07-27 | Disposition: A | Discharge status: Home / Self Care | Payer: Managed Care, Other (non HMO) | Source: Ambulatory Visit | Attending: Family | Admitting: Family

## 2021-07-27 ENCOUNTER — Other Ambulatory Visit: Payer: Self-pay | Admitting: Family

## 2021-07-27 VITALS — BP 113/67 | HR 71 | Resp 18 | Ht 71.0 in | Wt 320.5 lb

## 2021-07-27 DIAGNOSIS — Z634 Disappearance and death of family member: Secondary | ICD-10-CM | POA: Insufficient documentation

## 2021-07-27 DIAGNOSIS — D649 Anemia, unspecified: Secondary | ICD-10-CM | POA: Insufficient documentation

## 2021-07-27 DIAGNOSIS — I10 Essential (primary) hypertension: Secondary | ICD-10-CM

## 2021-07-27 DIAGNOSIS — Z8711 Personal history of peptic ulcer disease: Secondary | ICD-10-CM | POA: Diagnosis not present

## 2021-07-27 DIAGNOSIS — F419 Anxiety disorder, unspecified: Secondary | ICD-10-CM | POA: Insufficient documentation

## 2021-07-27 DIAGNOSIS — I110 Hypertensive heart disease with heart failure: Secondary | ICD-10-CM | POA: Insufficient documentation

## 2021-07-27 DIAGNOSIS — I340 Nonrheumatic mitral (valve) insufficiency: Secondary | ICD-10-CM | POA: Diagnosis not present

## 2021-07-27 DIAGNOSIS — R450 Nervousness: Secondary | ICD-10-CM | POA: Diagnosis not present

## 2021-07-27 DIAGNOSIS — Z79899 Other long term (current) drug therapy: Secondary | ICD-10-CM | POA: Diagnosis not present

## 2021-07-27 DIAGNOSIS — I5022 Chronic systolic (congestive) heart failure: Secondary | ICD-10-CM | POA: Insufficient documentation

## 2021-07-27 DIAGNOSIS — Z9884 Bariatric surgery status: Secondary | ICD-10-CM | POA: Diagnosis not present

## 2021-07-27 DIAGNOSIS — I509 Heart failure, unspecified: Secondary | ICD-10-CM | POA: Insufficient documentation

## 2021-07-27 DIAGNOSIS — Z7901 Long term (current) use of anticoagulants: Secondary | ICD-10-CM | POA: Insufficient documentation

## 2021-07-27 DIAGNOSIS — R5383 Other fatigue: Secondary | ICD-10-CM | POA: Insufficient documentation

## 2021-07-27 DIAGNOSIS — E119 Type 2 diabetes mellitus without complications: Secondary | ICD-10-CM

## 2021-07-27 DIAGNOSIS — Z7984 Long term (current) use of oral hypoglycemic drugs: Secondary | ICD-10-CM | POA: Insufficient documentation

## 2021-07-27 DIAGNOSIS — K219 Gastro-esophageal reflux disease without esophagitis: Secondary | ICD-10-CM | POA: Insufficient documentation

## 2021-07-27 LAB — BASIC METABOLIC PANEL
Anion gap: 9 (ref 5–15)
BUN: 20 mg/dL (ref 8–23)
CO2: 28 mmol/L (ref 22–32)
Calcium: 9.2 mg/dL (ref 8.9–10.3)
Chloride: 100 mmol/L (ref 98–111)
Creatinine, Ser: 0.96 mg/dL (ref 0.61–1.24)
GFR, Estimated: >60 mL/min (ref >60)
Glucose, Bld: 186 mg/dL — ABNORMAL HIGH (ref 70–99)
Potassium: 4.3 mmol/L (ref 3.5–5.1)
Sodium: 137 mmol/L (ref 135–145)

## 2021-07-27 MED ORDER — SPIRONOLACTONE 25 MG PO TABS: 25.0000 mg | ORAL_TABLET | Freq: Every day | ORAL | 5 refills | Status: DC

## 2021-07-27 NOTE — Patient Instructions (Signed)
Continue weighing daily and call for an overnight weight gain of > 2 pounds or a weekly weight gain of >5 pounds. 

## 2021-07-27 NOTE — Telephone Encounter (Signed)
Patient returned call, and was informed that lab work today, 11/18 was normal and he could start spironolactone 25 mg daily as discussed with Darylene Price, NP at appointment today. Patient confirmed that he can come to preadmit testing for labwork again on November 28th at 9 AM. Georg Ruddle, RN

## 2021-07-27 NOTE — Telephone Encounter (Addendum)
Left voicemail for patient after being unable to reach him to relay the below message. Asked the patient to give the office a call back as soon as possible to confirm date and time for lab work (as mentioned below).  Georg Ruddle, RN [facility] ----- Message from Alisa Graff, [place] sent at 07/27/2021 12:10 PM EST ----- Potassium, sodium and kidney function are all normal. Per our discussion, will start spironolactone 25mg  once a day. You will need to go to preadmit testing lab (where you went today) on Monday Nov 28th at 9:00am for lab work.

## 2021-08-06 ENCOUNTER — Other Ambulatory Visit: Payer: Self-pay

## 2021-08-06 ENCOUNTER — Other Ambulatory Visit
Admission: RE | Admit: 2021-08-06 | Discharge: 2021-08-06 | Disposition: A | Discharge status: Home / Self Care | Payer: Managed Care, Other (non HMO) | Source: Ambulatory Visit | Attending: Family | Admitting: Family

## 2021-08-06 DIAGNOSIS — Z01812 Encounter for preprocedural laboratory examination: Secondary | ICD-10-CM | POA: Diagnosis not present

## 2021-08-06 LAB — BASIC METABOLIC PANEL
Anion gap: 7 (ref 5–15)
BUN: 19 mg/dL (ref 8–23)
CO2: 27 mmol/L (ref 22–32)
Calcium: 8.6 mg/dL — ABNORMAL LOW (ref 8.9–10.3)
Chloride: 100 mmol/L (ref 98–111)
Creatinine, Ser: 0.87 mg/dL (ref 0.61–1.24)
GFR, Estimated: >60 mL/min (ref >60)
Glucose, Bld: 195 mg/dL — ABNORMAL HIGH (ref 70–99)
Potassium: 4.1 mmol/L (ref 3.5–5.1)
Sodium: 134 mmol/L — ABNORMAL LOW (ref 135–145)

## 2021-08-23 NOTE — Progress Notes (Signed)
Patient ID: William Niece., male    DOB: 12/07/1955, 65 y.o.   MRN: [id number]  HPI  William [name] is a 65 y/o male with a history of DM, HTN, GERD, PUD, anxiety, anemia, gastric bypass and heart failure.   Echo report from 01/23/21 reviewed and showed an EF of 45% along with LAE. Echo report from 11/26/19 reviewed and showed an EF of <20% along with William.   Has not been admitted or been in the ED in the last 6 months.   He presents today for a follow-up visit with a chief complaint of minimal fatigue upon moderate exertion. He describes this as chronic in nature having been present for several years. He has no other complaints and specifically denies any difficulty sleeping, dizziness, abdominal distention, palpitations, pedal edema, chest pain, shortness of breath, cough, fatigue or weight gain.   Tolerating spironolactone without known side effects.   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   CHF (congestive heart failure) ([place])    Diabetes mellitus without complication (HCC)    GERD (gastroesophageal reflux disease)    Hypertension    Iron deficiency anemia    Obesity    Osteoarthritis of hip    right   Peptic ulcer disease    Past Surgical History:  Procedure Laterality Date   CARDIOVERSION N/A 11/26/2019   Procedure: CARDIOVERSION;  Surgeon: Teodoro Spray, MD;  Location: [facility] ORS;  Service: Cardiovascular;  Laterality: N/A;   COLONOSCOPY     ESOPHAGOGASTRODUODENOSCOPY     GASTRIC BYPASS     2009   NASAL SINUS SURGERY     TOTAL HIP ARTHROPLASTY Right 08/20/2016   Procedure: TOTAL HIP ARTHROPLASTY ANTERIOR APPROACH;  Surgeon: Hessie Knows, MD;  Location: [facility] ORS;  Service: Orthopedics;  Laterality: Right;   Family History  Problem Relation Age of Onset   Dementia Mother    Diabetes Father    Social History   Tobacco Use   Smoking status: Never   Smokeless tobacco: Never  Substance Use Topics   Alcohol use: No    Allergies  Allergen Reactions   Morphine And Related Hives   Nsaids Other (See Comments)    Gastric bypass   Opium Hives    Opiate drip when had gastric bypass drug unknown (morphine drip)   Prior to Admission medications   Medication Sig Start Date End Date Taking? Authorizing Provider  amiodarone (PACERONE) 200 MG tablet Take 1 tablet (200 mg total) by mouth 2 (two) times daily. Patient taking differently: Take 200 mg by mouth daily. 11/27/19  Yes Nolberto Hanlon, MD  apixaban (ELIQUIS) 5 MG TABS tablet Take 1 tablet (5 mg total) by mouth 2 (two) times daily. 06/18/21  Yes Darylene Price A, FNP  carvedilol (COREG) 6.25 MG tablet TAKE 1 TABLET BY MOUTH  TWICE DAILY 06/18/21  Yes Armanie Ullmer A, FNP  Cyanocobalamin (B-12) 2500 MCG TABS Take 5,000 mcg by mouth daily.    Yes [provider]  D-Mannose 500 MG CAPS Take 2 mg by mouth.   Yes [provider]  ferrous sulfate 325 (65 FE) MG tablet Take 1 tablet (325 mg total) by mouth daily. 11/28/19  Yes Nolberto Hanlon, MD  FLUoxetine (PROZAC) 20 MG capsule Take 20 mg by mouth every evening.  07/20/16  Yes [provider]  furosemide (LASIX) 20 MG tablet Take 0.5 tablets (10 mg total) by mouth daily  as needed for fluid (SOB or weight is up by 3lbs). Patient taking differently: Take 40 mg by mouth daily. 11/27/19  Yes Nolberto Hanlon, MD  JARDIANCE 25 MG TABS tablet Take 25 mg by mouth daily. 11/02/19  Yes [provider]  metFORMIN (GLUCOPHAGE) 1000 MG tablet Take 1,000 mg by mouth 2 (two) times daily with a meal.    Yes [provider]  Multiple Vitamin (MULTIVITAMIN WITH MINERALS) TABS tablet Take 1 tablet by mouth daily.   Yes [provider]  pantoprazole (PROTONIX) 40 MG tablet Take 40 mg by mouth every evening.    Yes [provider]  pravastatin (PRAVACHOL) 40 MG tablet Take 40 mg by mouth at bedtime.  09/22/19  Yes [provider]  sacubitril-valsartan (ENTRESTO) 24-26 MG Take  1 tablet by mouth 2 (two) times daily.   Yes [provider]  spironolactone (ALDACTONE) 25 MG tablet Take 1 tablet (25 mg total) by mouth daily. 07/27/21 10/25/21 Yes Shakevia Sarris, Otila Kluver A, FNP  sildenafil (VIAGRA) 25 MG tablet Take 25 mg by mouth as needed for erectile dysfunction. Patient not taking: Reported on 07/27/2021    [provider]    Review of Systems  Constitutional:  Positive for fatigue (very little). Negative for appetite change.  HENT:  Negative for congestion, postnasal drip and sore throat.   Eyes: Negative.   Respiratory:  Negative for cough, chest tightness, shortness of breath and wheezing.   Cardiovascular:  Negative for chest pain, palpitations and leg swelling.  Gastrointestinal:  Negative for abdominal distention and abdominal pain.  Endocrine: Negative.   Genitourinary: Negative.   Musculoskeletal:  Negative for back pain and neck pain.  Skin: Negative.   Allergic/Immunologic: Negative.   Neurological:  Negative for dizziness and light-headedness.  Hematological:  Negative for adenopathy. Does not bruise/bleed easily.  Psychiatric/Behavioral:  Negative for dysphoric mood and sleep disturbance (sleeping on 1 pillow). The patient is nervous/anxious.    Vitals:   08/24/21 0900  BP: 103/65  Pulse: 77  Resp: 18  SpO2: 100%  Weight: (!) 317 lb (143.8 kg)  Height: 5\' 11"  (1.803 m)   Wt Readings from Last 3 Encounters:  08/24/21 (!) 317 lb (143.8 kg)  07/27/21 (!) 320 lb 8 oz (145.4 kg)  11/02/20 (!) 330 lb 2 oz (149.7 kg)   Lab Results  Component Value Date   CREATININE 0.87 08/06/2021   CREATININE 0.96 07/27/2021   CREATININE 0.92 01/28/2020   Physical Exam Vitals and nursing note reviewed.  Constitutional:      Appearance: Normal appearance.  HENT:     Head: Normocephalic and atraumatic.  Cardiovascular:     Rate and Rhythm: Normal rate and regular rhythm.  Pulmonary:     Effort: Pulmonary effort is normal. No respiratory distress.      Breath sounds: No wheezing or rales.  Abdominal:     General: Abdomen is flat. There is no distension.     Palpations: Abdomen is soft.  Musculoskeletal:        General: No tenderness.     Cervical back: Normal range of motion and neck supple.     Right lower leg: No edema.     Left lower leg: No edema.  Skin:    General: Skin is warm and dry.  Neurological:     General: No focal deficit present.     Mental Status: He is alert and oriented to person, place, and time.  Psychiatric:  Mood and Affect: Mood normal.        Behavior: Behavior normal.        Thought Content: Thought content normal.   Assessment & Plan:  1: Chronic heart failure with mildly reduced ejection fraction- - NYHA II - euvolemic today - weighing daily; reminded to call for an overnight weight gain of >2 pounds or a weekly weight gain of >5 pounds - weight down 3 pounds from last visit here 1 month ago - not adding salt and has been trying to keep daily sodium intake to 2000mg / day - saw cardiology Nehemiah Massed) 10/27/20; patient says that he may want to be referred to [facility] but he will let us know - on GDMT of carvedilol, jardiance, spironolactone and entresto - will check BMP today since it's been ~ 1 month since he's been on spironolactone - only taking his furosemide if he needs it - BNP 11/25/19 was 806.0  2: HTN- - BP looks good (103/65) - had telemedicine with PCP Caryl Comes) 11/19/19 - BMP 08/06/21 reviewed and showed sodium 134, potassium 4.1, creatinine 0.87 and GFR >60  3: DM- - A1c 11/12/19 was 8.2% - glucose at home today was 140 - had gastric bypass in 2009 when he weighed in the 500's   Medication bottles were reviewed.   Return in 3 months or sooner for any questions/problems before then.

## 2021-08-24 ENCOUNTER — Other Ambulatory Visit
Admission: RE | Admit: 2021-08-24 | Discharge: 2021-08-24 | Disposition: A | Discharge status: Home / Self Care | Payer: Managed Care, Other (non HMO) | Source: Ambulatory Visit | Attending: Family | Admitting: Family

## 2021-08-24 ENCOUNTER — Ambulatory Visit (HOSPITAL_BASED_OUTPATIENT_CLINIC_OR_DEPARTMENT_OTHER): Payer: Managed Care, Other (non HMO) | Admitting: Family

## 2021-08-24 ENCOUNTER — Telehealth: Payer: Self-pay

## 2021-08-24 ENCOUNTER — Other Ambulatory Visit: Payer: Self-pay

## 2021-08-24 ENCOUNTER — Encounter: Payer: Self-pay | Admitting: Family

## 2021-08-24 VITALS — BP 103/65 | HR 77 | Resp 18 | Ht 71.0 in | Wt 317.0 lb

## 2021-08-24 DIAGNOSIS — R5383 Other fatigue: Secondary | ICD-10-CM | POA: Insufficient documentation

## 2021-08-24 DIAGNOSIS — D649 Anemia, unspecified: Secondary | ICD-10-CM | POA: Insufficient documentation

## 2021-08-24 DIAGNOSIS — I5022 Chronic systolic (congestive) heart failure: Secondary | ICD-10-CM

## 2021-08-24 DIAGNOSIS — K219 Gastro-esophageal reflux disease without esophagitis: Secondary | ICD-10-CM | POA: Insufficient documentation

## 2021-08-24 DIAGNOSIS — F419 Anxiety disorder, unspecified: Secondary | ICD-10-CM | POA: Insufficient documentation

## 2021-08-24 DIAGNOSIS — Z7901 Long term (current) use of anticoagulants: Secondary | ICD-10-CM | POA: Insufficient documentation

## 2021-08-24 DIAGNOSIS — Z79899 Other long term (current) drug therapy: Secondary | ICD-10-CM | POA: Insufficient documentation

## 2021-08-24 DIAGNOSIS — E119 Type 2 diabetes mellitus without complications: Secondary | ICD-10-CM

## 2021-08-24 DIAGNOSIS — I10 Essential (primary) hypertension: Secondary | ICD-10-CM

## 2021-08-24 DIAGNOSIS — I110 Hypertensive heart disease with heart failure: Secondary | ICD-10-CM | POA: Diagnosis not present

## 2021-08-24 DIAGNOSIS — Z7984 Long term (current) use of oral hypoglycemic drugs: Secondary | ICD-10-CM | POA: Insufficient documentation

## 2021-08-24 LAB — BASIC METABOLIC PANEL
Anion gap: 6 (ref 5–15)
BUN: 22 mg/dL (ref 8–23)
CO2: 25 mmol/L (ref 22–32)
Calcium: 8.6 mg/dL — ABNORMAL LOW (ref 8.9–10.3)
Chloride: 104 mmol/L (ref 98–111)
Creatinine, Ser: 0.9 mg/dL (ref 0.61–1.24)
GFR, Estimated: >60 mL/min (ref >60)
Glucose, Bld: 196 mg/dL — ABNORMAL HIGH (ref 70–99)
Potassium: 4.2 mmol/L (ref 3.5–5.1)
Sodium: 135 mmol/L (ref 135–145)

## 2021-08-24 NOTE — Telephone Encounter (Signed)
-----   Message from Alisa Graff, [place] sent at 08/24/2021  1:39 PM EST ----- Sodium, potassium and kidney function are all normal. Continue medications

## 2021-08-24 NOTE — Patient Instructions (Signed)
Continue weighing daily and call for an overnight weight gain of 3 pounds or more or a weekly weight gain of more than 5 pounds.  °

## 2021-09-05 ENCOUNTER — Other Ambulatory Visit: Payer: Self-pay | Admitting: Family

## 2021-09-05 MED ORDER — SPIRONOLACTONE 25 MG PO TABS: 25.0000 mg | ORAL_TABLET | Freq: Every day | ORAL | 3 refills | Status: DC

## 2021-11-23 ENCOUNTER — Encounter: Payer: Self-pay | Admitting: Family

## 2021-11-23 ENCOUNTER — Other Ambulatory Visit: Payer: Self-pay

## 2021-11-23 ENCOUNTER — Ambulatory Visit: Payer: Managed Care, Other (non HMO) | Attending: Family | Admitting: Family

## 2021-11-23 VITALS — BP 124/76 | HR 68 | Resp 14 | Ht 71.0 in | Wt 302.2 lb

## 2021-11-23 DIAGNOSIS — Z833 Family history of diabetes mellitus: Secondary | ICD-10-CM | POA: Diagnosis not present

## 2021-11-23 DIAGNOSIS — E119 Type 2 diabetes mellitus without complications: Secondary | ICD-10-CM | POA: Diagnosis not present

## 2021-11-23 DIAGNOSIS — R42 Dizziness and giddiness: Secondary | ICD-10-CM | POA: Diagnosis not present

## 2021-11-23 DIAGNOSIS — Z79899 Other long term (current) drug therapy: Secondary | ICD-10-CM | POA: Insufficient documentation

## 2021-11-23 DIAGNOSIS — Z7985 Long-term (current) use of injectable non-insulin antidiabetic drugs: Secondary | ICD-10-CM | POA: Insufficient documentation

## 2021-11-23 DIAGNOSIS — R450 Nervousness: Secondary | ICD-10-CM | POA: Insufficient documentation

## 2021-11-23 DIAGNOSIS — D509 Iron deficiency anemia, unspecified: Secondary | ICD-10-CM | POA: Diagnosis not present

## 2021-11-23 DIAGNOSIS — Z7984 Long term (current) use of oral hypoglycemic drugs: Secondary | ICD-10-CM | POA: Diagnosis not present

## 2021-11-23 DIAGNOSIS — I5022 Chronic systolic (congestive) heart failure: Secondary | ICD-10-CM

## 2021-11-23 DIAGNOSIS — I110 Hypertensive heart disease with heart failure: Secondary | ICD-10-CM | POA: Diagnosis present

## 2021-11-23 DIAGNOSIS — Z9884 Bariatric surgery status: Secondary | ICD-10-CM | POA: Insufficient documentation

## 2021-11-23 DIAGNOSIS — I10 Essential (primary) hypertension: Secondary | ICD-10-CM | POA: Diagnosis not present

## 2021-11-23 DIAGNOSIS — Z8711 Personal history of peptic ulcer disease: Secondary | ICD-10-CM | POA: Insufficient documentation

## 2021-11-23 DIAGNOSIS — F419 Anxiety disorder, unspecified: Secondary | ICD-10-CM | POA: Diagnosis not present

## 2021-11-23 DIAGNOSIS — K219 Gastro-esophageal reflux disease without esophagitis: Secondary | ICD-10-CM | POA: Insufficient documentation

## 2021-11-23 NOTE — Progress Notes (Signed)
? Patient ID: William Niece., male    DOB: 06-28-1956, 66 y.o.   MRN: [id number] ? ?HPI ? ?William [name] is a 66 y/o male with a history of DM, HTN, GERD, PUD, anxiety, anemia, gastric bypass and heart failure.  ? ?Echo report from 01/23/21 reviewed and showed an EF of 45% along with LAE. Echo report from 11/26/19 reviewed and showed an EF of <20% along with William.  ? ?Has not been admitted or been in the ED in the last 6 months.  ? ?William [name] presents today for a follow-up visit with a chief complaint of intermittent dizziness. William [name] describes this as chronic in nature and tends to occur with sudden position changes. William [name] has associated anxiety and weight loss along with this. William [name] denies any abdominal distention, palpitations, pedal edema, chest pain, shortness of breath, cough or fatigue.  ? ?Has been started on mounjaro for his diabetes and feels like that is working well for him.  ? ?Past Medical History:  ?Diagnosis Date  ? Anxiety   ? Cancer [facility])   ? 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 CHF (congestive heart failure) ([place])   ? Diabetes mellitus without complication ([place])   ? GERD (gastroesophageal reflux disease)   ? Hypertension   ? Iron deficiency anemia   ? Obesity   ? Osteoarthritis of hip   ? right  ? Peptic ulcer disease   ? ?Past Surgical History:  ?Procedure Laterality Date  ? CARDIOVERSION N/A 11/26/2019  ? Procedure: CARDIOVERSION;  Surgeon: Teodoro Spray, MD;  Location: [facility] ORS;  Service: Cardiovascular;  Laterality: N/A;  ? COLONOSCOPY    ? ESOPHAGOGASTRODUODENOSCOPY    ? GASTRIC BYPASS    ? 2009  ? NASAL SINUS SURGERY    ? TOTAL HIP ARTHROPLASTY Right 08/20/2016  ? Procedure: TOTAL HIP ARTHROPLASTY ANTERIOR APPROACH;  Surgeon: Hessie Knows, MD;  Location: [facility] ORS;  Service: Orthopedics;  Laterality: Right;  ? ?Family History  ?Problem Relation Age of Onset  ? Dementia Mother   ? Diabetes Father   ? ?Social History  ? ?Tobacco Use  ? Smoking status: Never  ? Smokeless tobacco: Never  ?Substance Use Topics  ? Alcohol use: No   ? ?Allergies  ?Allergen Reactions  ? Morphine And Related Hives  ? Nsaids Other (See Comments)  ?  Gastric bypass  ? Opium Hives  ?  Opiate drip when had gastric bypass drug unknown (morphine drip)  ? ?Prior to Admission medications   ?Medication Sig Start Date End Date Taking? Authorizing Provider  ?amiodarone (PACERONE) 200 MG tablet Take 1 tablet (200 mg total) by mouth 2 (two) times daily. ?Patient taking differently: Take 200 mg by mouth daily. 11/27/19  Yes Nolberto Hanlon, MD  ?apixaban (ELIQUIS) 5 MG TABS tablet Take 1 tablet (5 mg total) by mouth 2 (two) times daily. 06/18/21  Yes Alisa Graff, FNP  ?carvedilol (COREG) 6.25 MG tablet TAKE 1 TABLET BY MOUTH  TWICE DAILY 06/18/21  Yes Alisa Graff, FNP  ?Cyanocobalamin (B-12) 2500 MCG TABS Take 5,000 mcg by mouth daily.    Yes [provider]  ?ferrous sulfate 325 (65 FE) MG tablet Take 1 tablet (325 mg total) by mouth daily. 11/28/19  Yes Nolberto Hanlon, MD  ?FLUoxetine (PROZAC) 20 MG capsule Take 40 mg by mouth every evening. 07/20/16  Yes [provider]  ?furosemide (LASIX) 20 MG tablet Take 0.5 tablets (10 mg total) by mouth daily as needed for fluid (SOB or weight is  up by 3lbs). ?Patient taking differently: Take 40 mg by mouth daily. 11/27/19  Yes Nolberto Hanlon, MD  ?JARDIANCE 25 MG TABS tablet Take 25 mg by mouth daily. 11/02/19  Yes [provider]  ?metFORMIN (GLUCOPHAGE) 1000 MG tablet Take 1,000 mg by mouth 2 (two) times daily with a meal.    Yes [provider]  ?Multiple Vitamin (MULTIVITAMIN WITH MINERALS) TABS tablet Take 1 tablet by mouth daily.   Yes [provider]  ?pantoprazole (PROTONIX) 40 MG tablet Take 40 mg by mouth every evening.    Yes [provider]  ?pravastatin (PRAVACHOL) 40 MG tablet Take 40 mg by mouth at bedtime.  09/22/19  Yes [provider]  ?sacubitril-valsartan (ENTRESTO) 24-26 MG Take 1 tablet by mouth 2 (two) times daily.   Yes [provider]   ?sildenafil (VIAGRA) 25 MG tablet Take 25 mg by mouth as needed for erectile dysfunction.   Yes [provider]  ?spironolactone (ALDACTONE) 25 MG tablet Take 1 tablet (25 mg total) by mouth daily. 09/05/21 12/04/21 Yes Alisa Graff, FNP  ?tirzepatide [facility]) 2.5 MG/0.5ML Pen Inject 2.5 mg into the skin once a week.   Yes [provider]  ?D-Mannose 500 MG CAPS Take 2 mg by mouth. ?Patient not taking: Reported on 11/23/2021    [provider]  ? ?Review of Systems  ?Constitutional:  Negative for appetite change and fatigue.  ?HENT:  Negative for congestion, postnasal drip and sore throat.   ?Eyes: Negative.   ?Respiratory:  Negative for cough, chest tightness, shortness of breath and wheezing.   ?Cardiovascular:  Negative for leg swelling.  ?Gastrointestinal:  Negative for abdominal distention.  ?Endocrine: Negative.   ?Genitourinary: Negative.   ?Musculoskeletal:  Negative for back pain and neck pain.  ?Skin: Negative.   ?Allergic/Immunologic: Negative.   ?Neurological:  Positive for dizziness (with position changes). Negative for light-headedness.  ?Hematological:  Negative for adenopathy. Does not bruise/bleed easily.  ?Psychiatric/Behavioral:  Negative for dysphoric mood and sleep disturbance (sleeping on 1 pillow). The patient is nervous/anxious.   ? ?Vitals:  ? 11/23/21 0841  ?BP: 124/76  ?Pulse: 68  ?Resp: 14  ?SpO2: 100%  ?Weight: (!) 302 lb 4 oz (137.1 kg)  ?Height: '5\' 11"'$  (1.803 m)  ? ?Wt Readings from Last 3 Encounters:  ?11/23/21 (!) 302 lb 4 oz (137.1 kg)  ?08/24/21 (!) 317 lb (143.8 kg)  ?07/27/21 (!) 320 lb 8 oz (145.4 kg)  ? ?Lab Results  ?Component Value Date  ? CREATININE 0.90 08/24/2021  ? CREATININE 0.87 08/06/2021  ? CREATININE 0.96 07/27/2021  ? ?Physical Exam ?Vitals and nursing note reviewed.  ?Constitutional:   ?   Appearance: Normal appearance.  ?HENT:  ?   Head: Normocephalic and atraumatic.  ?Cardiovascular:  ?   Rate and Rhythm: Normal rate and regular  rhythm.  ?Pulmonary:  ?   Effort: Pulmonary effort is normal. No respiratory distress.  ?   Breath sounds: No wheezing or rales.  ?Abdominal:  ?   General: Abdomen is flat. There is no distension.  ?   Palpations: Abdomen is soft.  ?Musculoskeletal:     ?   General: No tenderness.  ?   Cervical back: Normal range of motion and neck supple.  ?   Right lower leg: No edema.  ?   Left lower leg: No edema.  ?Skin: ?   General: Skin is warm and dry.  ?Neurological:  ?   General: No focal deficit present.  ?  Mental Status: William [name] is alert and oriented to person, place, and time.  ?Psychiatric:     ?   Mood and Affect: Mood normal.     ?   Behavior: Behavior normal.     ?   Thought Content: Thought content normal.  ? ?Assessment & Plan: ? ?1: Chronic heart failure with mildly reduced ejection fraction- ?- NYHA I ?- euvolemic today ?- weighing daily; reminded to call for an overnight weight gain of >2 pounds or a weekly weight gain of >5 pounds ?- weight down 15 pounds from last visit here 3 months ago ?- not adding salt and has been trying to keep daily sodium intake to '2000mg'$ / day ?- saw cardiology Nehemiah Massed) 10/27/20; requests an appointment with [facility] cardiology; appt scheduled with Dr. Rockey Situ on 01/07/22 ?- on GDMT of carvedilol, jardiance, spironolactone and entresto ?- only taking his furosemide if William [name] needs it ?- BNP 11/25/19 was 806.0 ? ?2: HTN- ?- BP looks good (124/76) ?- saw PCP Caryl Comes) 10/01/21 ?- BMP 09/24/21 reviewed and showed sodium 139, potassium 4.6, creatinine 1.1 & GFR 67 ? ?3: DM- ?- A1c 09/24/21 was 8.1% ?- glucose at home yesterday was 136 ?- had gastric bypass in 2009 when William [name] weighed in the 500's ? ? ?Medication bottles were reviewed.  ? ?Return in 3 months, sooner if needed.  ? ? ? ? ? ? ? ? ? ? ? ? ?

## 2021-11-23 NOTE — Patient Instructions (Signed)
Continue weighing daily and call for an overnight weight gain of 3 pounds or more or a weekly weight gain of more than 5 pounds.   If you have voicemail, please make sure your mailbox is cleaned out so that we may leave a message and please make sure to listen to any voicemails.     

## 2021-12-18 ENCOUNTER — Other Ambulatory Visit: Payer: Self-pay | Admitting: Family

## 2021-12-18 MED ORDER — SPIRONOLACTONE 25 MG PO TABS: 25.0000 mg | ORAL_TABLET | Freq: Every day | ORAL | 3 refills | Status: DC

## 2021-12-18 NOTE — Progress Notes (Signed)
Spironolactone RX sent to [facility] order ?

## 2022-01-06 DIAGNOSIS — I420 Dilated cardiomyopathy: Secondary | ICD-10-CM | POA: Insufficient documentation

## 2022-01-06 DIAGNOSIS — I480 Paroxysmal atrial fibrillation: Secondary | ICD-10-CM | POA: Insufficient documentation

## 2022-01-06 NOTE — Progress Notes (Signed)
Cardiology Office Note ? ?Date:  01/07/2022  ? ?ID:  William Niece., DOB 01/25/1956, MRN [id number] ? ?PCP:  Adin Hector, MD  ? ?Chief Complaint  ?Patient presents with  ? New Patient (Initial Visit)  ?  Establish care for LBBB & CHF; former Dr. Serafina Royals patient. "Doing well." Medications reviewed by the patient verbally.   ? ? ?HPI:  ?[name] is a 66 yo male with PMH of ?Gastric bypass for weight 500 pounds, lost 100 pounds ?dilated cardiomyopathy ejection fraction less than 25%, idiopathic per [facility] notes at [facility] ?Ejection fraction less than 20% on echo March 2021 ?status post cardioversion for atrial fibrillation 3/21 ?Type 2 diabetes mellitus without complication ?Mild Aortic valve stenosis ?Who presents for new patient evaluation for his dilated cardiomyopathy ? ?Discussed events from 2021 ?Received the Covid vaccine 2021, shortly after developed CHF, noted to be in afib ?In the hospital 3/21 underwent treatment for afib, dilated cardiomyopathy ?Cardioversion 3/21 ? ?Followed by CHF clinic, previously followed at [facility] ? ?Maintaining on carvedilol and entresto with diuretics, Jardiance spironolactone ?On MOUNJARO for weight loss, lost 20 pounds ?Gets rare lightheadness ? ?Labs reviewed:  ?A1C 7.0 ?Total chol 171, LDL 90 ? ?Echo 01/2021 reviewed in detail ?EF 35-40%  ?NORMAL RIGHT VENTRICULAR SYSTOLIC FUNCTION  ?TRIVIAL REGURGITATION NOTED  ?MILD VALVULAR STENOSIS  ?AVA(VTI)=.93cm^2  ?TRIVIAL MR, TR  ?MILD AS  ? ?EKG personally reviewed by myself on todays visit ?NSR rate 69 bpm, LBBB ? ?PMH:   has a past medical history of Anxiety, Cancer ([place]), CHF (congestive heart failure) ([place]), Diabetes mellitus without complication ([place]), GERD (gastroesophageal reflux disease), Hypertension, Iron deficiency anemia, Obesity, Osteoarthritis of hip, and Peptic ulcer disease. ? ?PSH:    ?Past Surgical History:  ?Procedure Laterality Date  ? CARDIOVERSION N/A 11/26/2019  ? Procedure: CARDIOVERSION;  Surgeon:  Teodoro Spray, MD;  Location: [facility] ORS;  Service: Cardiovascular;  Laterality: N/A;  ? COLONOSCOPY    ? ESOPHAGOGASTRODUODENOSCOPY    ? GASTRIC BYPASS    ? 2009  ? NASAL SINUS SURGERY    ? TOTAL HIP ARTHROPLASTY Right 08/20/2016  ? Procedure: TOTAL HIP ARTHROPLASTY ANTERIOR APPROACH;  Surgeon: Hessie Knows, MD;  Location: [facility] ORS;  Service: Orthopedics;  Laterality: Right;  ? ? ?Current Outpatient Medications  ?Medication Sig Dispense Refill  ? amiodarone (PACERONE) 200 MG tablet Take 200 mg by mouth daily.    ? apixaban (ELIQUIS) 5 MG TABS tablet Take 1 tablet (5 mg total) by mouth 2 (two) times daily. 180 tablet 3  ? carvedilol (COREG) 6.25 MG tablet TAKE 1 TABLET BY MOUTH  TWICE DAILY 180 tablet 3  ? Cyanocobalamin (B-12) 2500 MCG TABS Take 5,000 mcg by mouth daily.     ? FLUoxetine (PROZAC) 20 MG capsule Take 40 mg by mouth every evening.  3  ? furosemide (LASIX) 20 MG tablet Take 20 mg by mouth daily.    ? JARDIANCE 25 MG TABS tablet Take 25 mg by mouth daily.    ? metFORMIN (GLUCOPHAGE) 1000 MG tablet Take 1,000 mg by mouth 2 (two) times daily with a meal.     ? Multiple Vitamin (MULTIVITAMIN WITH MINERALS) TABS tablet Take 1 tablet by mouth daily.    ? pantoprazole (PROTONIX) 40 MG tablet Take 40 mg by mouth every evening.     ? pravastatin (PRAVACHOL) 40 MG tablet Take 40 mg by mouth at bedtime.     ? sacubitril-valsartan (ENTRESTO) 24-26 MG Take 1 tablet  by mouth 2 (two) times daily.    ? spironolactone (ALDACTONE) 25 MG tablet Take 1 tablet (25 mg total) by mouth daily. 90 tablet 3  ? tirzepatide [facility]) 2.5 MG/0.5ML Pen Inject 2.5 mg into the skin once a week.    ? D-Mannose 500 MG CAPS Take 2 mg by mouth. (Patient not taking: Reported on 11/23/2021)    ? ferrous sulfate 325 (65 FE) MG tablet Take 1 tablet (325 mg total) by mouth daily. (Patient not taking: Reported on 01/07/2022) 60 tablet 0  ? sildenafil (VIAGRA) 25 MG tablet Take 25 mg by mouth as needed for erectile dysfunction. (Patient not  taking: Reported on 01/07/2022)    ? ?No current facility-administered medications for this visit.  ? ? ?Allergies:   Morphine and related, Nsaids, and Opium  ? ?Social History:  The patient  reports that he has never smoked. He has never used smokeless tobacco. He reports that he does not drink alcohol and does not use drugs.  ? ?Family History:   family history includes Dementia in his mother; Diabetes in his father.  ? ? ?Review of Systems: ?Review of Systems  ?Constitutional: Negative.   ?HENT: Negative.    ?Respiratory: Negative.    ?Cardiovascular: Negative.   ?Gastrointestinal: Negative.   ?Musculoskeletal: Negative.   ?Neurological: Negative.   ?Psychiatric/Behavioral: Negative.    ?All other systems reviewed and are negative. ? ?PHYSICAL EXAM: ?VS:  BP 118/76 (BP Location: Right Arm, Patient Position: Sitting, Cuff Size: Normal)   Ht 6' (1.829 m)   Wt 299 lb 4 oz (135.7 kg)   SpO2 98%   BMI 40.59 kg/m?  , BMI Body mass index is 40.59 kg/m?[name] ?Constitutional:  oriented to person, place, and time. No distress. obese ?HENT:  ?Head: Grossly normal ?Eyes:  no discharge. No scleral icterus.  ?Neck: No JVD, no carotid bruits  ?Cardiovascular: Regular rate and rhythm, no murmurs appreciated ?Pulmonary/Chest: Clear to auscultation bilaterally, no wheezes or rails ?Abdominal: Soft.  no distension.  no tenderness.  ?Musculoskeletal: Normal range of motion ?Neurological:  normal muscle tone. Coordination normal. No atrophy ?Skin: Skin warm and dry ?Psychiatric: normal affect, pleasant ? ?Recent Labs: ?08/24/2021: BUN 22; Creatinine, Ser 0.90; Potassium 4.2; Sodium 135  ? ? ?Lipid Panel ?No results found for: CHOL, HDL, LDLCALC, TRIG ?  ? ?Wt Readings from Last 3 Encounters:  ?01/07/22 299 lb 4 oz (135.7 kg)  ?11/23/21 (!) 302 lb 4 oz (137.1 kg)  ?08/24/21 (!) 317 lb (143.8 kg)  ?  ? ?ASSESSMENT AND PLAN: ? ?Problem List Items Addressed This Visit   ? ?  ? Cardiology Problems  ? Dilated cardiomyopathy ([place])  ? Relevant  Medications  ? amiodarone (PACERONE) 200 MG tablet  ? furosemide (LASIX) 20 MG tablet  ? PAF (paroxysmal atrial fibrillation) ([place])  ? Relevant Medications  ? amiodarone (PACERONE) 200 MG tablet  ? furosemide (LASIX) 20 MG tablet  ? Acute on chronic combined systolic and diastolic CHF (congestive heart failure) ([place]) - Primary  ? Relevant Medications  ? amiodarone (PACERONE) 200 MG tablet  ? furosemide (LASIX) 20 MG tablet  ?  ? Other  ? Type 2 diabetes mellitus without complication (HCC)  ? ?preop cardiovascular evaluation for colonocopy procedure ?Acceptable risk for procedure, recommend he hold Eliquis 2 days prior to colonoscopy ? ?PAF ?No further epsidoes , last in 2021 ?On coreg and amiodarone ?TSH stable ? ?Dilated cardiomyopathy ?Nonischemic CM,  ?On entresto, coreg, spironolactone, lasix ,Jardiance ?Mild improvement in ejection  fraction now 35 to 40% on last echocardiogram ?No medication changes made ? ?Morbid obesity ?On weight loss medication, weight down 20 pounds ?Also prior history of gastric bypass surgery ?Discussed with weight trend downward need to monitor blood pressure closely for orthostasis symptoms ? ? ? Total encounter time more than 60 minutes ? Greater than 50% was spent in counseling and coordination of care with the patient ? ? ? ?Signed, ?Esmond Plants, M.D., Ph.D. ?[facility] [place], [place] ?[phone] ?

## 2022-01-07 ENCOUNTER — Ambulatory Visit: Payer: Managed Care, Other (non HMO) | Admitting: Cardiovascular Disease

## 2022-01-07 ENCOUNTER — Encounter: Payer: Self-pay | Admitting: Cardiovascular Disease

## 2022-01-07 VITALS — BP 118/76 | HR 69 | Ht 72.0 in | Wt 299.2 lb

## 2022-01-07 DIAGNOSIS — I5043 Acute on chronic combined systolic (congestive) and diastolic (congestive) heart failure: Secondary | ICD-10-CM | POA: Diagnosis not present

## 2022-01-07 DIAGNOSIS — I480 Paroxysmal atrial fibrillation: Secondary | ICD-10-CM

## 2022-01-07 DIAGNOSIS — E119 Type 2 diabetes mellitus without complications: Secondary | ICD-10-CM

## 2022-01-07 DIAGNOSIS — I420 Dilated cardiomyopathy: Secondary | ICD-10-CM | POA: Diagnosis not present

## 2022-01-07 NOTE — Patient Instructions (Signed)
Medication Instructions:  ?Hold eliquis 2 days prior to procedure/colonoscopy ?Hold lasix day of the procedure ? ?If you need a refill on your cardiac medications before your next appointment, please call your pharmacy.  ? ?Lab work: ?No new labs needed ? ?Testing/Procedures: ?No new testing needed ? ?Follow-Up: ?At [facility], you and your health needs are our priority.  As part of our continuing mission to provide you with exceptional heart care, we have created designated Provider Care Teams.  These Care Teams include your primary Cardiologist (physician) and Advanced Practice Providers (APPs -  Physician Assistants and Nurse Practitioners) who all work together to provide you with the care you need, when you need it. ? ?You will need a follow up appointment in 6 months ? ?Providers on your designated Care Team:   ?Murray Hodgkins, NP ?Christell Faith, PA-C ?Cadence Kathlen Mody, PA-C ? ?COVID-19 Vaccine Information can be found at: [email or web address] For questions related to vaccine distribution or appointments, please email [email or web address] or call [phone].  ? ?

## 2022-02-21 ENCOUNTER — Other Ambulatory Visit: Payer: Self-pay | Admitting: Family

## 2022-02-21 MED ORDER — APIXABAN 5 MG PO TABS
5.0000 mg | ORAL_TABLET | Freq: Two times a day (BID) | ORAL | 3 refills | Status: DC
Start: 2022-02-21 — End: 2023-02-11

## 2022-03-08 ENCOUNTER — Ambulatory Visit: Payer: Managed Care, Other (non HMO) | Attending: Family | Admitting: Family

## 2022-03-08 ENCOUNTER — Encounter: Payer: Self-pay | Admitting: Family

## 2022-03-08 VITALS — BP 92/72 | HR 74 | Resp 18 | Ht 72.0 in | Wt 290.0 lb

## 2022-03-08 DIAGNOSIS — Z7901 Long term (current) use of anticoagulants: Secondary | ICD-10-CM | POA: Diagnosis not present

## 2022-03-08 DIAGNOSIS — E119 Type 2 diabetes mellitus without complications: Secondary | ICD-10-CM

## 2022-03-08 DIAGNOSIS — I10 Essential (primary) hypertension: Secondary | ICD-10-CM

## 2022-03-08 DIAGNOSIS — Z886 Allergy status to analgesic agent status: Secondary | ICD-10-CM | POA: Diagnosis not present

## 2022-03-08 DIAGNOSIS — Z7984 Long term (current) use of oral hypoglycemic drugs: Secondary | ICD-10-CM | POA: Diagnosis not present

## 2022-03-08 DIAGNOSIS — I110 Hypertensive heart disease with heart failure: Secondary | ICD-10-CM | POA: Insufficient documentation

## 2022-03-08 DIAGNOSIS — R42 Dizziness and giddiness: Secondary | ICD-10-CM | POA: Diagnosis not present

## 2022-03-08 DIAGNOSIS — Z9884 Bariatric surgery status: Secondary | ICD-10-CM | POA: Insufficient documentation

## 2022-03-08 DIAGNOSIS — E669 Obesity, unspecified: Secondary | ICD-10-CM | POA: Insufficient documentation

## 2022-03-08 DIAGNOSIS — I509 Heart failure, unspecified: Secondary | ICD-10-CM | POA: Insufficient documentation

## 2022-03-08 DIAGNOSIS — Z96641 Presence of right artificial hip joint: Secondary | ICD-10-CM | POA: Insufficient documentation

## 2022-03-08 DIAGNOSIS — I5022 Chronic systolic (congestive) heart failure: Secondary | ICD-10-CM

## 2022-03-08 DIAGNOSIS — K219 Gastro-esophageal reflux disease without esophagitis: Secondary | ICD-10-CM | POA: Diagnosis not present

## 2022-03-08 DIAGNOSIS — Z7985 Long-term (current) use of injectable non-insulin antidiabetic drugs: Secondary | ICD-10-CM | POA: Diagnosis not present

## 2022-03-08 DIAGNOSIS — Z833 Family history of diabetes mellitus: Secondary | ICD-10-CM | POA: Insufficient documentation

## 2022-03-08 DIAGNOSIS — F419 Anxiety disorder, unspecified: Secondary | ICD-10-CM | POA: Diagnosis not present

## 2022-03-08 DIAGNOSIS — Z885 Allergy status to narcotic agent status: Secondary | ICD-10-CM | POA: Insufficient documentation

## 2022-03-08 DIAGNOSIS — Z85048 Personal history of other malignant neoplasm of rectum, rectosigmoid junction, and anus: Secondary | ICD-10-CM | POA: Insufficient documentation

## 2022-03-08 NOTE — Progress Notes (Signed)
Patient ID: William Niece., male    DOB: 11/29/1955, 66 y.o.   MRN: [id number]  HPI  William [name] is a 66 y/o male with a history of DM, HTN, GERD, PUD, anxiety, anemia, gastric bypass and heart failure.   Echo report from 01/23/21 reviewed and showed an EF of 45% along with LAE. Echo report from 11/26/19 reviewed and showed an EF of <20% along with William.   Has not been admitted or been in the ED in the last 6 months.   He presents today for a follow-up visit with a chief complaint of dizziness. He describes this as chronic although occurs on an intermittent basis with sudden position changes. He has associated anxiety along with this. He has no other complaints and specifically denies any difficulty sleeping, abdominal distention, palpitations, pedal edema, chest pain, wheezing, shortness of breath, cough, fatigue or weight gain.   Continues to lose weight taking mounjaro. Does notice that he forgets to hydrate with taking it. Says that sometimes he doesn't drink much water until his mouth is dry.   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   CHF (congestive heart failure) ([place])    Diabetes mellitus without complication (HCC)    GERD (gastroesophageal reflux disease)    Hypertension    Iron deficiency anemia    Obesity    Osteoarthritis of hip    right   Peptic ulcer disease    Past Surgical History:  Procedure Laterality Date   CARDIOVERSION N/A 11/26/2019   Procedure: CARDIOVERSION;  Surgeon: Teodoro Spray, MD;  Location: [facility] ORS;  Service: Cardiovascular;  Laterality: N/A;   COLONOSCOPY     ESOPHAGOGASTRODUODENOSCOPY     GASTRIC BYPASS     2009   NASAL SINUS SURGERY     TOTAL HIP ARTHROPLASTY Right 08/20/2016   Procedure: TOTAL HIP ARTHROPLASTY ANTERIOR APPROACH;  Surgeon: Hessie Knows, MD;  Location: [facility] ORS;  Service: Orthopedics;  Laterality: Right;   Family History  Problem Relation Age of Onset   Dementia Mother     Diabetes Father    Social History   Tobacco Use   Smoking status: Never   Smokeless tobacco: Never  Substance Use Topics   Alcohol use: No   Allergies  Allergen Reactions   Morphine And Related Hives   Nsaids Other (See Comments)    Gastric bypass   Opium Hives    Opiate drip when had gastric bypass drug unknown (morphine drip)   Prior to Admission medications   Medication Sig Start Date End Date Taking? Authorizing Provider  amiodarone (PACERONE) 200 MG tablet Take 200 mg by mouth daily.   Yes [provider]  apixaban (ELIQUIS) 5 MG TABS tablet Take 1 tablet (5 mg total) by mouth 2 (two) times daily. 02/21/22  Yes Darylene Price A, FNP  carvedilol (COREG) 6.25 MG tablet TAKE 1 TABLET BY MOUTH  TWICE DAILY 06/18/21  Yes William [name] A, FNP  Cyanocobalamin (B-12) 2500 MCG TABS Take 5,000 mcg by mouth daily.    Yes [provider]  FLUoxetine (PROZAC) 20 MG capsule Take 40 mg by mouth every evening. 07/20/16  Yes [provider]  furosemide (LASIX) 20 MG tablet Take 20 mg by mouth daily.   Yes [provider]  JARDIANCE 25 MG TABS tablet Take 25 mg by mouth daily. 11/02/19  Yes [provider]  metFORMIN (GLUCOPHAGE) 1000 MG tablet  Take 1,000 mg by mouth 2 (two) times daily with a meal.    Yes [provider]  Multiple Vitamin (MULTIVITAMIN WITH MINERALS) TABS tablet Take 1 tablet by mouth daily.   Yes [provider]  pantoprazole (PROTONIX) 40 MG tablet Take 40 mg by mouth every evening.    Yes [provider]  pravastatin (PRAVACHOL) 40 MG tablet Take 40 mg by mouth at bedtime.  09/22/19  Yes [provider]  sacubitril-valsartan (ENTRESTO) 24-26 MG Take 1 tablet by mouth 2 (two) times daily.   Yes [provider]  spironolactone (ALDACTONE) 25 MG tablet Take 1 tablet (25 mg total) by mouth daily. 12/18/21 03/18/22 Yes Sian Rockers A, FNP  tirzepatide [facility]) 2.5 MG/0.5ML Pen Inject 5 mg into  the skin once a week.   Yes [provider]  D-Mannose 500 MG CAPS Take 2 mg by mouth. Patient not taking: Reported on 11/23/2021    [provider]  sildenafil (VIAGRA) 25 MG tablet Take 25 mg by mouth as needed for erectile dysfunction. Patient not taking: Reported on 01/07/2022    [provider]    Review of Systems  Constitutional:  Negative for appetite change and fatigue.  HENT:  Negative for congestion, postnasal drip and sore throat.   Eyes: Negative.   Respiratory:  Negative for cough, chest tightness, shortness of breath and wheezing.   Cardiovascular:  Negative for leg swelling.  Gastrointestinal:  Negative for abdominal distention.  Endocrine: Negative.   Genitourinary: Negative.   Musculoskeletal:  Negative for back pain and neck pain.  Skin: Negative.   Allergic/Immunologic: Negative.   Neurological:  Positive for dizziness (with position changes). Negative for light-headedness.  Hematological:  Negative for adenopathy. Does not bruise/bleed easily.  Psychiatric/Behavioral:  Negative for dysphoric mood and sleep disturbance (sleeping on 1 pillow). The patient is nervous/anxious.    Vitals:   03/08/22 0827  BP: 92/72  Pulse: 74  Resp: 18  SpO2: 100%  Weight: 290 lb (131.5 kg)  Height: 6' (1.829 m)   Wt Readings from Last 3 Encounters:  03/08/22 290 lb (131.5 kg)  01/07/22 299 lb 4 oz (135.7 kg)  11/23/21 (!) 302 lb 4 oz (137.1 kg)   Lab Results  Component Value Date   CREATININE 0.90 08/24/2021   CREATININE 0.87 08/06/2021   CREATININE 0.96 07/27/2021   Physical Exam Vitals and nursing note reviewed.  Constitutional:      Appearance: Normal appearance.  HENT:     Head: Normocephalic and atraumatic.  Cardiovascular:     Rate and Rhythm: Normal rate and regular rhythm.  Pulmonary:     Effort: Pulmonary effort is normal. No respiratory distress.     Breath sounds: No wheezing or rales.  Abdominal:     General: Abdomen is flat.  There is no distension.     Palpations: Abdomen is soft.  Musculoskeletal:        General: No tenderness.     Cervical back: Normal range of motion and neck supple.     Right lower leg: No edema.     Left lower leg: No edema.  Skin:    General: Skin is warm and dry.  Neurological:     General: No focal deficit present.     Mental Status: He is alert and oriented to person, place, and time.  Psychiatric:        Mood and Affect: Mood normal.        Behavior: Behavior normal.  Thought Content: Thought content normal.    Assessment & Plan:  1: Chronic heart failure with mildly reduced ejection fraction- - NYHA I - euvolemic today - weighing daily; reminded to call for an overnight weight gain of >2 pounds or a weekly weight gain of >5 pounds - weight down 12 pounds from last visit here 3 months ago - not adding salt and has been trying to keep daily sodium intake to '2000mg'$ / day - saw cardiology William [name]) on 01/07/22 - on GDMT of carvedilol, jardiance, spironolactone and entresto - only taking his furosemide if he needs it - BNP 11/25/19 was 806.0  2: HTN- - BP low (92/72) and he endorses dizziness with sudden position changes - says that home BP runs 124-130/70's - discussed decreasing his spironolactone but he prefers to wait as he's been feeling so good with the medications - emphasized to make sure he's drinking enough fluids (60 ounces daily) and to not wait until his mouth is feeling dry - saw PCP William [name]) 12/24/21 - BMP 12/21/21 reviewed and showed sodium 139, potassium 4.7, creatinine 1.0 & GFR 75  3: DM- - A1c 12/21/21 was 7.0% - now taking mounjaro with good results with weight loss - had gastric bypass in 2009 when he weighed in the 500's   Medication bottles were reviewed.   Return in 2 months, sooner if needed

## 2022-03-08 NOTE — Patient Instructions (Addendum)
Continue weighing daily and call for an overnight weight gain of 3 pounds or more or a weekly weight gain of more than 5 pounds.   If you have voicemail, please make sure your mailbox is cleaned out so that we may leave a message and please make sure to listen to any voicemails.    If you receive a satisfaction survey regarding the Heart Failure Clinic, please take the time to fill it out. This way we can continue to provide excellent care and make any changes that need to be made.    Drink 60-64 ounces of fluid daily.

## 2022-05-06 ENCOUNTER — Other Ambulatory Visit: Payer: Self-pay | Admitting: Family

## 2022-05-06 MED ORDER — CARVEDILOL 6.25 MG PO TABS
6.2500 mg | ORAL_TABLET | Freq: Two times a day (BID) | ORAL | 3 refills | Status: DC
Start: 2022-05-06 — End: 2023-02-11

## 2022-06-04 ENCOUNTER — Ambulatory Visit: Payer: Managed Care, Other (non HMO) | Admitting: Family

## 2022-06-07 ENCOUNTER — Encounter: Payer: Self-pay | Admitting: Family

## 2022-06-07 ENCOUNTER — Ambulatory Visit: Payer: Managed Care, Other (non HMO) | Attending: Family | Admitting: Family

## 2022-06-07 VITALS — BP 105/66 | HR 61 | Resp 18 | Ht 73.0 in | Wt 281.0 lb

## 2022-06-07 DIAGNOSIS — Z79899 Other long term (current) drug therapy: Secondary | ICD-10-CM | POA: Diagnosis not present

## 2022-06-07 DIAGNOSIS — Z9884 Bariatric surgery status: Secondary | ICD-10-CM | POA: Insufficient documentation

## 2022-06-07 DIAGNOSIS — I5022 Chronic systolic (congestive) heart failure: Secondary | ICD-10-CM | POA: Diagnosis not present

## 2022-06-07 DIAGNOSIS — E119 Type 2 diabetes mellitus without complications: Secondary | ICD-10-CM

## 2022-06-07 DIAGNOSIS — I10 Essential (primary) hypertension: Secondary | ICD-10-CM | POA: Diagnosis not present

## 2022-06-07 DIAGNOSIS — Z7985 Long-term (current) use of injectable non-insulin antidiabetic drugs: Secondary | ICD-10-CM | POA: Insufficient documentation

## 2022-06-07 DIAGNOSIS — I110 Hypertensive heart disease with heart failure: Secondary | ICD-10-CM | POA: Diagnosis not present

## 2022-06-07 DIAGNOSIS — Z7984 Long term (current) use of oral hypoglycemic drugs: Secondary | ICD-10-CM | POA: Insufficient documentation

## 2022-06-07 NOTE — Progress Notes (Signed)
Patient ID: William Niece., male    DOB: October 04, 1955, 66 y.o.   MRN: [id number]  HPI  William [name] is a 66 y/o male with a history of DM, HTN, GERD, PUD, anxiety, anemia, gastric bypass and heart failure.   Echo report from 01/23/21 reviewed and showed an EF of 45% along with LAE. Echo report from 11/26/19 reviewed and showed an EF of <20% along with William.   Has not been admitted or been in the ED in the last 6 months.   He presents today with a chief complaint of a follow-up visit. He voices no complaints and specifically denies any difficulty sleeping, dizziness, abdominal distention, palpitations, pedal edema, chest pain, wheezing, shortness of breath, cough, fatigue or weight gain.   Reports hydrating better since last visit here. Checks his BP daily and at home gets readings of 125-130's/60-70's. Varicose veins in right lower leg but he denies any pain with them. Says they became more pronounced after his hip surgery.   Getting labs drawn next week for his PCP and then his CPE in a couple of weeks. Has not gotten his flu vaccine yet.   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   CHF (congestive heart failure) ([place])    Diabetes mellitus without complication (HCC)    GERD (gastroesophageal reflux disease)    Hypertension    Iron deficiency anemia    Obesity    Osteoarthritis of hip    right   Peptic ulcer disease    Past Surgical History:  Procedure Laterality Date   CARDIOVERSION N/A 11/26/2019   Procedure: CARDIOVERSION;  Surgeon: Teodoro Spray, MD;  Location: [facility] ORS;  Service: Cardiovascular;  Laterality: N/A;   COLONOSCOPY     ESOPHAGOGASTRODUODENOSCOPY     GASTRIC BYPASS     2009   NASAL SINUS SURGERY     TOTAL HIP ARTHROPLASTY Right 08/20/2016   Procedure: TOTAL HIP ARTHROPLASTY ANTERIOR APPROACH;  Surgeon: Hessie Knows, MD;  Location: [facility] ORS;  Service: Orthopedics;  Laterality: Right;   Family History  Problem Relation  Age of Onset   Dementia Mother    Diabetes Father    Social History   Tobacco Use   Smoking status: Never   Smokeless tobacco: Never  Substance Use Topics   Alcohol use: No   Allergies  Allergen Reactions   Morphine And Related Hives   Nsaids Other (See Comments)    Gastric bypass   Opium Hives    Opiate drip when had gastric bypass drug unknown (morphine drip)   Prior to Admission medications   Medication Sig Start Date End Date Taking? Authorizing Provider  amiodarone (PACERONE) 200 MG tablet Take 200 mg by mouth daily.   Yes [provider]  apixaban (ELIQUIS) 5 MG TABS tablet Take 1 tablet (5 mg total) by mouth 2 (two) times daily. 02/21/22  Yes Darylene Price A, FNP  carvedilol (COREG) 6.25 MG tablet Take 1 tablet (6.25 mg total) by mouth 2 (two) times daily. 05/06/22  Yes Undrea Shipes A, FNP  Cyanocobalamin (B-12) 2500 MCG TABS Take 5,000 mcg by mouth daily.    Yes [provider]  FLUoxetine (PROZAC) 20 MG capsule Take 40 mg by mouth every evening. 07/20/16  Yes [provider]  furosemide (LASIX) 20 MG tablet Take 20 mg by mouth daily.   Yes [provider]  JARDIANCE 25 MG TABS tablet Take  25 mg by mouth daily. 11/02/19  Yes [provider]  metFORMIN (GLUCOPHAGE) 1000 MG tablet Take 1,000 mg by mouth 2 (two) times daily with a meal.    Yes [provider]  Multiple Vitamin (MULTIVITAMIN WITH MINERALS) TABS tablet Take 1 tablet by mouth daily.   Yes [provider]  pantoprazole (PROTONIX) 40 MG tablet Take 40 mg by mouth every evening.    Yes [provider]  pravastatin (PRAVACHOL) 40 MG tablet Take 40 mg by mouth at bedtime.  09/22/19  Yes [provider]  sacubitril-valsartan (ENTRESTO) 24-26 MG Take 1 tablet by mouth 2 (two) times daily.   Yes [provider]  spironolactone (ALDACTONE) 25 MG tablet Take 1 tablet (25 mg total) by mouth daily. 12/18/21 06/07/22 Yes Kenden Brandt A, FNP   tirzepatide [facility]) 2.5 MG/0.5ML Pen Inject 5 mg into the skin once a week.   Yes [provider]    Review of Systems  Constitutional:  Negative for appetite change and fatigue.  HENT:  Negative for congestion, postnasal drip and sore throat.   Eyes: Negative.   Respiratory:  Negative for cough, chest tightness, shortness of breath and wheezing.   Cardiovascular:  Negative for leg swelling.  Gastrointestinal:  Negative for abdominal distention.  Endocrine: Negative.   Genitourinary: Negative.   Musculoskeletal:  Negative for back pain and neck pain.  Skin: Negative.   Allergic/Immunologic: Negative.   Neurological:  Negative for dizziness and light-headedness.  Hematological:  Negative for adenopathy. Does not bruise/bleed easily.  Psychiatric/Behavioral:  Negative for dysphoric mood and sleep disturbance (sleeping on 1 pillow). The patient is nervous/anxious.    Vitals:   06/07/22 0834  BP: 105/66  Pulse: 61  Resp: 18  SpO2: 100%  Weight: 281 lb (127.5 kg)  Height: '6\' 1"'$  (1.854 m)   Wt Readings from Last 3 Encounters:  06/07/22 281 lb (127.5 kg)  03/08/22 290 lb (131.5 kg)  01/07/22 299 lb 4 oz (135.7 kg)   Lab Results  Component Value Date   CREATININE 0.90 08/24/2021   CREATININE 0.87 08/06/2021   CREATININE 0.96 07/27/2021   Physical Exam Vitals and nursing note reviewed.  Constitutional:      Appearance: Normal appearance.  HENT:     Head: Normocephalic and atraumatic.  Cardiovascular:     Rate and Rhythm: Normal rate and regular rhythm.  Pulmonary:     Effort: Pulmonary effort is normal. No respiratory distress.     Breath sounds: No wheezing or rales.  Abdominal:     General: Abdomen is flat. There is no distension.     Palpations: Abdomen is soft.  Musculoskeletal:        General: No tenderness.     Cervical back: Normal range of motion and neck supple.     Right lower leg: No edema.     Left lower leg: No edema.  Skin:    General:  Skin is warm and dry.  Neurological:     General: No focal deficit present.     Mental Status: He is alert and oriented to person, place, and time.  Psychiatric:        Mood and Affect: Mood normal.        Behavior: Behavior normal.        Thought Content: Thought content normal.    Assessment & Plan:  1: Chronic heart failure with mildly reduced ejection fraction- - NYHA I - euvolemic today - weighing daily; reminded to call for  an overnight weight gain of >2 pounds or a weekly weight gain of >5 pounds - weight down 9 pounds from last visit here 3 months ago - has increased his activity working out in the yard; voices concern about what to to when it's winter time to keep his activity level up; discussed walking at the mall or joining a gym short-term - not adding salt and has been trying to keep daily sodium intake to '2000mg'$ / day - saw cardiology Rockey Situ) on 01/07/22 - on GDMT of carvedilol, jardiance, spironolactone and entresto - only taking his furosemide if he needs it - BNP 11/25/19 was 806.0  2: HTN- - BP initially low but then rechecked was 105/66 - reports home readings of 125-130's/ 60-70's - saw PCP Caryl Comes) 12/24/21 - BMP 12/21/21 reviewed and showed sodium 139, potassium 4.7, creatinine 1.0 & GFR 75; getting labs next week prior to his CPE  3: DM- - A1c 12/21/21 was 7.0% - home glucose this morning was 130 - continues to take mounjaro with good results with weight loss - had gastric bypass in 2009 when he weighed in the 500's   Medication bottles were reviewed.   Return in 6 months, sooner if needed.

## 2022-06-07 NOTE — Patient Instructions (Signed)
Continue weighing daily and call for an overnight weight gain of 3 pounds or more or a weekly weight gain of more than 5 pounds  If you have voicemail, please make sure your mailbox is cleaned out so that we may leave a message and please make sure to listen to any voicemails.    If you receive a satisfaction survey regarding the Heart Failure Clinic, please take the time to fill it out. This way we can continue to provide excellent care and make any changes that need to be made.     

## 2022-07-14 NOTE — Progress Notes (Unsigned)
Cardiology Office Note  Date:  07/15/2022   ID:  William [name]., DOB 18-Jan-1956, MRN [id number]  PCP:  Adin Hector, MD   Chief Complaint  Patient presents with   6 month follow up     "Doing well." Medications reviewed by the patient verbally.     HPI:  [name] is a 66 yo male with PMH of Gastric bypass for weight 500 pounds, lost 100 pounds dilated cardiomyopathy ejection fraction less than 25%, idiopathic per the notes at [facility] Ejection fraction less than 20% on echo March 2021 status post cardioversion for atrial fibrillation 3/21 Type 2 diabetes mellitus without complication Mild Aortic valve stenosis Echocardiogram May 2022 ejection fraction 35 to 40% Who presents for new patient evaluation for his dilated cardiomyopathy  Last seen in cardiology clinic May 2023 Followed by CHF clinic  Works at [employer] down 25 pounds since 01/2022 On MOUNJARO for weight loss,   Followed by CHF clinic Maintaining on carvedilol and entresto with diuretics, Jardiance spironolactone Previously reported having lightheadedness, this has resolved,  tolerating medications with no side effects  Labs reviewed:  A1C 6.4 Total chol 158, LDL 83  Echo 01/2021 EF 35-40%  NORMAL RIGHT VENTRICULAR SYSTOLIC FUNCTION  TRIVIAL REGURGITATION NOTED  MILD VALVULAR STENOSIS  AVA(VTI)=.93cm^2  TRIVIAL MR, TR  MILD AS   EKG personally reviewed by myself on todays visit NSR rate 69 bpm, LBBB  Discussed events from 2021 Received the [place] vaccine 2021, shortly after developed CHF, noted to be in afib In the hospital 3/21 underwent treatment for afib, dilated cardiomyopathy Cardioversion 3/21  PMH:   has a past medical history of Anxiety, Cancer ([place]), CHF (congestive heart failure) ([place]), Diabetes mellitus without complication ([place]), GERD (gastroesophageal reflux disease), Hypertension, Iron deficiency anemia, Obesity, Osteoarthritis of hip, and Peptic ulcer  disease.  PSH:    Past Surgical History:  Procedure Laterality Date   CARDIOVERSION N/A 11/26/2019   Procedure: CARDIOVERSION;  Surgeon: Teodoro Spray, MD;  Location: [facility] ORS;  Service: Cardiovascular;  Laterality: N/A;   COLONOSCOPY     ESOPHAGOGASTRODUODENOSCOPY     GASTRIC BYPASS     2009   NASAL SINUS SURGERY     TOTAL HIP ARTHROPLASTY Right 08/20/2016   Procedure: TOTAL HIP ARTHROPLASTY ANTERIOR APPROACH;  Surgeon: Hessie Knows, MD;  Location: [facility] ORS;  Service: Orthopedics;  Laterality: Right;    Current Outpatient Medications  Medication Sig Dispense Refill   amiodarone (PACERONE) 200 MG tablet Take 200 mg by mouth daily.     apixaban (ELIQUIS) 5 MG TABS tablet Take 1 tablet (5 mg total) by mouth 2 (two) times daily. 180 tablet 3   carvedilol (COREG) 6.25 MG tablet Take 1 tablet (6.25 mg total) by mouth 2 (two) times daily. 180 tablet 3   Cyanocobalamin (B-12) 2500 MCG TABS Take 5,000 mcg by mouth daily.      FLUoxetine (PROZAC) 20 MG capsule Take 40 mg by mouth every evening.  3   furosemide (LASIX) 20 MG tablet Take 20 mg by mouth daily.     JARDIANCE 25 MG TABS tablet Take 25 mg by mouth daily.     metFORMIN (GLUCOPHAGE) 1000 MG tablet Take 1,000 mg by mouth 2 (two) times daily with a meal.      Multiple Vitamin (MULTIVITAMIN WITH MINERALS) TABS tablet Take 1 tablet by mouth daily.     pantoprazole (PROTONIX) 40 MG tablet Take 40 mg by mouth every evening.  pravastatin (PRAVACHOL) 40 MG tablet Take 40 mg by mouth at bedtime.      sacubitril-valsartan (ENTRESTO) 24-26 MG Take 1 tablet by mouth 2 (two) times daily.     spironolactone (ALDACTONE) 25 MG tablet Take 1 tablet (25 mg total) by mouth daily. 90 tablet 3   tirzepatide (MOUNJARO) 2.5 MG/0.5ML Pen Inject 7.5 mg into the skin once a week.     No current facility-administered medications for this visit.   Allergies:   Morphine and related, Nsaids, and Opium   Social History:  The patient  reports that he has  never smoked. He has never used smokeless tobacco. He reports that he does not drink alcohol and does not use drugs.   Family History:   family history includes Dementia in his mother; Diabetes in his father.   Review of Systems: Review of Systems  Constitutional: Negative.   HENT: Negative.    Respiratory: Negative.    Cardiovascular: Negative.   Gastrointestinal: Negative.   Musculoskeletal: Negative.   Neurological: Negative.   Psychiatric/Behavioral: Negative.    All other systems reviewed and are negative.  PHYSICAL EXAM: VS:  BP 120/70 (BP Location: Left Arm, Patient Position: Sitting, Cuff Size: Normal)   Pulse 66   Ht '5\' 11"'$  (1.803 m)   Wt 274 lb 6 oz (124.5 kg)   SpO2 95%   BMI 38.27 kg/m  , BMI Body mass index is 38.27 kg/m. Constitutional:  oriented to person, place, and time. No distress.  HENT:  Head: Grossly normal Eyes:  no discharge. No scleral icterus.  Neck: No JVD, no carotid bruits  Cardiovascular: Regular rate and rhythm, 1/6 systolic ejection murmur right and left sternal border Pulmonary/Chest: Clear to auscultation bilaterally, no wheezes or rails Abdominal: Soft.  no distension.  no tenderness.  Musculoskeletal: Normal range of motion Neurological:  normal muscle tone. Coordination normal. No atrophy Skin: Skin warm and dry Psychiatric: normal affect, pleasant  Recent Labs: 08/24/2021: BUN 22; Creatinine, Ser 0.90; Potassium 4.2; Sodium 135   Lipid Panel No results found for: "CHOL", "HDL", "LDLCALC", "TRIG"   Wt Readings from Last 3 Encounters:  07/15/22 274 lb 6 oz (124.5 kg)  06/07/22 281 lb (127.5 kg)  03/08/22 290 lb (131.5 kg)     ASSESSMENT AND PLAN:  Problem List Items Addressed This Visit       Cardiology Problems   Dilated cardiomyopathy ([place])   Relevant Orders   EKG 12-Lead   PAF (paroxysmal atrial fibrillation) ([place])     Other   Type 2 diabetes mellitus without complication ([place])   Relevant Orders   EKG 12-Lead    Other Visit Diagnoses     Chronic systolic heart failure ([place])    -  Primary   Relevant Orders   EKG 12-Lead   Essential hypertension          PAF Maintaining normal sinus rhythm No further epsidoes , last in 2021 On coreg and amiodarone TSH and LFTs stable  Dilated cardiomyopathy Nonischemic CM,  On entresto, coreg, spironolactone, lasix ,Jardiance Mild improvement in ejection fraction now 35 to 40% on last echocardiogram done outside our system No medication changes made, previously with lightheadedness, currently asymptomatic Could consider repeat echocardiogram 2024  Morbid obesity Continues to lose weight on Mounjaro Recommended further weight loss, walking program   Total encounter time more than 60 minutes  Greater than 50% was spent in counseling and coordination of care with the patient    Signed, Esmond Plants, M.D., Ph.D.  [place], [place]

## 2022-07-15 ENCOUNTER — Encounter: Payer: Self-pay | Admitting: Cardiovascular Disease

## 2022-07-15 ENCOUNTER — Ambulatory Visit: Payer: Managed Care, Other (non HMO) | Attending: Cardiovascular Disease | Admitting: Cardiovascular Disease

## 2022-07-15 VITALS — BP 120/70 | HR 66 | Ht 71.0 in | Wt 274.4 lb

## 2022-07-15 DIAGNOSIS — I420 Dilated cardiomyopathy: Secondary | ICD-10-CM | POA: Diagnosis not present

## 2022-07-15 DIAGNOSIS — I10 Essential (primary) hypertension: Secondary | ICD-10-CM | POA: Diagnosis not present

## 2022-07-15 DIAGNOSIS — I480 Paroxysmal atrial fibrillation: Secondary | ICD-10-CM

## 2022-07-15 DIAGNOSIS — E119 Type 2 diabetes mellitus without complications: Secondary | ICD-10-CM | POA: Diagnosis not present

## 2022-07-15 DIAGNOSIS — I5022 Chronic systolic (congestive) heart failure: Secondary | ICD-10-CM

## 2022-07-15 NOTE — Patient Instructions (Signed)
Medication Instructions:  No changes  If you need a refill on your cardiac medications before your next appointment, please call your pharmacy.   Lab work: No new labs needed  Testing/Procedures: No new testing needed  Follow-Up: At [facility], you and your health needs are our priority.  As part of our continuing mission to provide you with exceptional heart care, we have created designated Provider Care Teams.  These Care Teams include your primary Cardiologist (physician) and Advanced Practice Providers (APPs -  Physician Assistants and Nurse Practitioners) who all work together to provide you with the care you need, when you need it.  You will need a follow up appointment in 12 months  Providers on your designated Care Team:   Christopher Berge, NP Ryan Dunn, PA-C Cadence Furth, PA-C  COVID-19 Vaccine Information can be found at: [email or web address] For questions related to vaccine distribution or appointments, please email [email or web address] or call [phone].   

## 2022-10-15 ENCOUNTER — Other Ambulatory Visit: Payer: Self-pay | Admitting: Family

## 2022-12-06 ENCOUNTER — Ambulatory Visit: Payer: Managed Care, Other (non HMO) | Attending: Family | Admitting: Family

## 2022-12-06 ENCOUNTER — Encounter: Payer: Self-pay | Admitting: Family

## 2022-12-06 VITALS — BP 110/63 | HR 66 | Wt 265.2 lb

## 2022-12-06 DIAGNOSIS — I5022 Chronic systolic (congestive) heart failure: Secondary | ICD-10-CM | POA: Insufficient documentation

## 2022-12-06 DIAGNOSIS — I110 Hypertensive heart disease with heart failure: Secondary | ICD-10-CM | POA: Diagnosis not present

## 2022-12-06 DIAGNOSIS — I480 Paroxysmal atrial fibrillation: Secondary | ICD-10-CM | POA: Insufficient documentation

## 2022-12-06 DIAGNOSIS — Z7984 Long term (current) use of oral hypoglycemic drugs: Secondary | ICD-10-CM | POA: Insufficient documentation

## 2022-12-06 DIAGNOSIS — Z79899 Other long term (current) drug therapy: Secondary | ICD-10-CM | POA: Insufficient documentation

## 2022-12-06 DIAGNOSIS — Z9884 Bariatric surgery status: Secondary | ICD-10-CM | POA: Diagnosis not present

## 2022-12-06 DIAGNOSIS — I10 Essential (primary) hypertension: Secondary | ICD-10-CM | POA: Diagnosis not present

## 2022-12-06 DIAGNOSIS — K219 Gastro-esophageal reflux disease without esophagitis: Secondary | ICD-10-CM | POA: Insufficient documentation

## 2022-12-06 DIAGNOSIS — F419 Anxiety disorder, unspecified: Secondary | ICD-10-CM | POA: Insufficient documentation

## 2022-12-06 DIAGNOSIS — E119 Type 2 diabetes mellitus without complications: Secondary | ICD-10-CM | POA: Insufficient documentation

## 2022-12-06 NOTE — Patient Instructions (Signed)
  Testing/Procedures:  Your physician has requested that you have an echocardiogram. Echocardiography is a painless test that uses sound waves to create images of your heart. It provides your doctor with information about the size and shape of your heart and how well your heart's chambers and valves are working. This procedure takes approximately one hour. There are no restrictions for this procedure. Please do NOT wear cologne, perfume, aftershave, or lotions (deodorant is allowed). Please arrive 15 minutes prior to your appointment time.    Special Instructions // Education:  Do the following things EVERYDAY: Weigh yourself in the morning before breakfast. Write it down and keep it in a log. Take your medicines as prescribed Eat low salt foods--Limit salt (sodium) to 2000 mg per day.  Stay as active as you can everyday Limit all fluids for the day to less than 2 liters       If you have any questions or concerns before your next appointment please send [place] a message through mychart or call our office at [phone] Monday-Friday 8 am-5 pm.   If you have an urgent need after hours on the weekend please call your Primary Cardiologist or the [facility] in [place] at [phone].

## 2022-12-06 NOTE — Progress Notes (Signed)
Patient ID: William Niece., male    DOB: 02/08/1956, 67 y.o.   MRN: [id number]  HPI  William [name] is a 67 y/o male with a history of DM, HTN, GERD, PUD, anxiety, anemia, gastric bypass and heart failure.   Echo 01/23/21: EF of 45% along with LAE. Echo 11/26/19: EF of <20% along with William.   Has not been admitted or been in the ED in the last 6 months.   He presents today with a chief complaint of a follow-up visit. Currently denies any difficulty sleeping, dizziness, abdominal distention, palpitations, pedal edema, chest pain, wheezing, SOB, cough, fatigue or weight gain.   Is being active out in his yard and continues to lose weight.   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   CHF (congestive heart failure) ([place])    Diabetes mellitus without complication (HCC)    GERD (gastroesophageal reflux disease)    Hypertension    Iron deficiency anemia    Obesity    Osteoarthritis of hip    right   Peptic ulcer disease    Past Surgical History:  Procedure Laterality Date   CARDIOVERSION N/A 11/26/2019   Procedure: CARDIOVERSION;  Surgeon: Teodoro Spray, MD;  Location: [facility] ORS;  Service: Cardiovascular;  Laterality: N/A;   COLONOSCOPY     ESOPHAGOGASTRODUODENOSCOPY     GASTRIC BYPASS     2009   NASAL SINUS SURGERY     TOTAL HIP ARTHROPLASTY Right 08/20/2016   Procedure: TOTAL HIP ARTHROPLASTY ANTERIOR APPROACH;  Surgeon: Hessie Knows, MD;  Location: [facility] ORS;  Service: Orthopedics;  Laterality: Right;   Family History  Problem Relation Age of Onset   Dementia Mother    Diabetes Father    Social History   Tobacco Use   Smoking status: Never   Smokeless tobacco: Never  Substance Use Topics   Alcohol use: No   Allergies  Allergen Reactions   Morphine And Related Hives   Nsaids Other (See Comments)    Gastric bypass   Opium Hives    Opiate drip when had gastric bypass drug unknown (morphine drip)   Prior to Admission  medications   Medication Sig Start Date End Date Taking? Authorizing Provider  amiodarone (PACERONE) 200 MG tablet Take 200 mg by mouth daily.   Yes [provider]  apixaban (ELIQUIS) 5 MG TABS tablet Take 1 tablet (5 mg total) by mouth 2 (two) times daily. 02/21/22  Yes Darylene Price A, FNP  carvedilol (COREG) 6.25 MG tablet Take 1 tablet (6.25 mg total) by mouth 2 (two) times daily. 05/06/22  Yes Gloristine Turrubiates A, FNP  Cyanocobalamin (B-12) 2500 MCG TABS Take 5,000 mcg by mouth daily.    Yes [provider]  FLUoxetine (PROZAC) 20 MG capsule Take 40 mg by mouth every evening. 07/20/16  Yes [provider]  furosemide (LASIX) 20 MG tablet Take 40 mg by mouth daily.   Yes [provider]  JARDIANCE 25 MG TABS tablet Take 25 mg by mouth daily. 11/02/19  Yes [provider]  metFORMIN (GLUCOPHAGE) 1000 MG tablet Take 1,000 mg by mouth 2 (two) times daily with a meal.    Yes [provider]  Multiple Vitamin (MULTIVITAMIN WITH MINERALS) TABS tablet Take 1 tablet by mouth daily.   Yes [provider]  pantoprazole (PROTONIX) 40 MG tablet Take 40 mg by mouth every evening.    Yes  [provider]  pravastatin (PRAVACHOL) 40 MG tablet Take 40 mg by mouth at bedtime.  09/22/19  Yes [provider]  sacubitril-valsartan (ENTRESTO) 24-26 MG Take 1 tablet by mouth 2 (two) times daily.   Yes [provider]  spironolactone (ALDACTONE) 25 MG tablet TAKE 1 TABLET BY MOUTH DAILY 10/15/22  Yes Tamzin Bertling A, FNP  tirzepatide [facility]) 2.5 MG/0.5ML Pen Inject 10 mg into the skin once a week.   Yes [provider]    Review of Systems  Constitutional:  Negative for appetite change and fatigue.  HENT:  Negative for congestion, postnasal drip and sore throat.   Eyes: Negative.   Respiratory:  Negative for cough, chest tightness, shortness of breath and wheezing.   Cardiovascular:  Negative for leg swelling.   Gastrointestinal:  Negative for abdominal distention.  Endocrine: Negative.   Genitourinary: Negative.   Musculoskeletal:  Negative for back pain and neck pain.  Skin: Negative.   Allergic/Immunologic: Negative.   Neurological:  Negative for dizziness and light-headedness.  Hematological:  Negative for adenopathy. Does not bruise/bleed easily.  Psychiatric/Behavioral:  Negative for dysphoric mood and sleep disturbance (sleeping on 1 pillow). The patient is not nervous/anxious.    Vitals:   12/06/22 0841  BP: 110/63  Pulse: 66  SpO2: 100%  Weight: 265 lb 3.2 oz (120.3 kg)   Wt Readings from Last 3 Encounters:  12/06/22 265 lb 3.2 oz (120.3 kg)  07/15/22 274 lb 6 oz (124.5 kg)  06/07/22 281 lb (127.5 kg)   Lab Results  Component Value Date   CREATININE 0.90 08/24/2021   CREATININE 0.87 08/06/2021   CREATININE 0.96 07/27/2021   Physical Exam Vitals and nursing note reviewed.  Constitutional:      Appearance: Normal appearance.  HENT:     Head: Normocephalic and atraumatic.  Cardiovascular:     Rate and Rhythm: Normal rate and regular rhythm.  Pulmonary:     Effort: Pulmonary effort is normal. No respiratory distress.     Breath sounds: No wheezing or rales.  Abdominal:     General: Abdomen is flat. There is no distension.     Palpations: Abdomen is soft.  Musculoskeletal:        General: No tenderness.     Cervical back: Normal range of motion and neck supple.     Right lower leg: No edema.     Left lower leg: No edema.  Skin:    General: Skin is warm and dry.  Neurological:     General: No focal deficit present.     Mental Status: He is alert and oriented to person, place, and time.  Psychiatric:        Mood and Affect: Mood normal.        Behavior: Behavior normal.        Thought Content: Thought content normal.    Assessment & Plan:  1: Chronic heart failure with mildly reduced ejection fraction- - NYHA I - euvolemic today - weighing daily; reminded to  call for an overnight weight gain of >2 pounds or a weekly weight gain of >5 pounds - weight down 15.8 pounds from last visit here 6 months ago - echo 01/23/21: EF of 45% along with LAE. Echo 11/26/19: EF of <20% along with William.  - have scheduled updated echo for 01/03/23 - not adding salt and has been trying to keep daily sodium intake to 2000mg / day - saw cardiology William [name]) on 07/15/22 - carvedilol 6.25mg  BID - jardiance  25mg  daily - entresto 24/26mg  BID - spironolactone 25mg  daily - only taking his furosemide if he needs it - BNP 11/25/19 was 806.0  2: HTN- - BP 110/63 - saw PCP William [name]) 11/11/22 - BMP 10/16/22 showed sodium 141, potassium 4.3, creatinine 0.8 & GFR 98  3: DM- - A1c 10/16/22 was 6.4% - home glucose this morning was 130 - continues to take mounjaro with good results with weight loss - had gastric bypass in 2009 when he weighed in the 500's  4: Paroxysmal Atrial fibrillation- - cardioverted 11/2019 - amiodarone 200mg  daily - apixaban 5mg  BID   Medication bottles were reviewed.   Return in 1 month, sooner if needed.

## 2023-01-03 ENCOUNTER — Ambulatory Visit
Admission: RE | Admit: 2023-01-03 | Discharge: 2023-01-03 | Disposition: A | Discharge status: Home / Self Care | Payer: Managed Care, Other (non HMO) | Source: Ambulatory Visit | Attending: Family | Admitting: Family

## 2023-01-03 DIAGNOSIS — I5022 Chronic systolic (congestive) heart failure: Secondary | ICD-10-CM | POA: Diagnosis not present

## 2023-01-03 DIAGNOSIS — I080 Rheumatic disorders of both mitral and aortic valves: Secondary | ICD-10-CM | POA: Diagnosis not present

## 2023-01-03 DIAGNOSIS — E119 Type 2 diabetes mellitus without complications: Secondary | ICD-10-CM | POA: Diagnosis not present

## 2023-01-03 DIAGNOSIS — I4891 Unspecified atrial fibrillation: Secondary | ICD-10-CM | POA: Diagnosis not present

## 2023-01-03 DIAGNOSIS — I429 Cardiomyopathy, unspecified: Secondary | ICD-10-CM | POA: Diagnosis not present

## 2023-01-03 LAB — ECHOCARDIOGRAM COMPLETE
AR max vel: 0.95 cm2
AV Area VTI: 0.95 cm2
AV Area mean vel: 0.94 cm2
AV Mean grad: 22.5 mmHg
AV Peak grad: 42.4 mmHg
Ao pk vel: 3.26 m/s
Area-P 1/2: 3.48 cm2
Calc EF: 43.6 %
MV VTI: 2.7 cm2
S' Lateral: 3.3 cm
Single Plane A2C EF: 52.4 %
Single Plane A4C EF: 37.4 %

## 2023-01-03 NOTE — Progress Notes (Signed)
*  PRELIMINARY RESULTS* Echocardiogram 2D Echocardiogram has been performed.  Carolyne Fiscal 01/03/2023, 12:17 PM

## 2023-01-06 ENCOUNTER — Encounter: Payer: Self-pay | Admitting: Family

## 2023-01-09 ENCOUNTER — Ambulatory Visit: Payer: Managed Care, Other (non HMO) | Attending: Family | Admitting: Family

## 2023-01-09 ENCOUNTER — Encounter: Payer: Self-pay | Admitting: Family

## 2023-01-09 VITALS — BP 103/70 | HR 64 | Resp 14 | Wt 265.0 lb

## 2023-01-09 DIAGNOSIS — I110 Hypertensive heart disease with heart failure: Secondary | ICD-10-CM | POA: Diagnosis not present

## 2023-01-09 DIAGNOSIS — I428 Other cardiomyopathies: Secondary | ICD-10-CM | POA: Diagnosis not present

## 2023-01-09 DIAGNOSIS — I480 Paroxysmal atrial fibrillation: Secondary | ICD-10-CM

## 2023-01-09 DIAGNOSIS — I080 Rheumatic disorders of both mitral and aortic valves: Secondary | ICD-10-CM | POA: Diagnosis not present

## 2023-01-09 DIAGNOSIS — Z7984 Long term (current) use of oral hypoglycemic drugs: Secondary | ICD-10-CM | POA: Diagnosis not present

## 2023-01-09 DIAGNOSIS — I5020 Unspecified systolic (congestive) heart failure: Secondary | ICD-10-CM | POA: Insufficient documentation

## 2023-01-09 DIAGNOSIS — I5022 Chronic systolic (congestive) heart failure: Secondary | ICD-10-CM

## 2023-01-09 DIAGNOSIS — Z7985 Long-term (current) use of injectable non-insulin antidiabetic drugs: Secondary | ICD-10-CM | POA: Insufficient documentation

## 2023-01-09 DIAGNOSIS — E119 Type 2 diabetes mellitus without complications: Secondary | ICD-10-CM | POA: Insufficient documentation

## 2023-01-09 DIAGNOSIS — K219 Gastro-esophageal reflux disease without esophagitis: Secondary | ICD-10-CM | POA: Diagnosis not present

## 2023-01-09 DIAGNOSIS — Z79899 Other long term (current) drug therapy: Secondary | ICD-10-CM | POA: Insufficient documentation

## 2023-01-09 DIAGNOSIS — I10 Essential (primary) hypertension: Secondary | ICD-10-CM

## 2023-01-09 DIAGNOSIS — Z9884 Bariatric surgery status: Secondary | ICD-10-CM | POA: Diagnosis not present

## 2023-01-09 DIAGNOSIS — I509 Heart failure, unspecified: Secondary | ICD-10-CM | POA: Diagnosis present

## 2023-01-09 DIAGNOSIS — Z7901 Long term (current) use of anticoagulants: Secondary | ICD-10-CM | POA: Diagnosis not present

## 2023-01-09 NOTE — Patient Instructions (Signed)
Resume weighing daily and call for an overnight weight gain of 3 pounds or more or a weekly weight gain of more than 5 pounds.  °

## 2023-01-09 NOTE — Progress Notes (Signed)
[facility] HEART FAILURE CLINIC - Pharmacist Education Note  Assessment William [name]. is a 68 y.o. male with HFmrEF (EF 41-49%) presenting to the Heart Failure Clinic for follow up. [name] reports no issues with adverse drug events or medication access. He reports good adherence to current regimen. He reports checking his weight weekly, and his weight has been relatively stable around 236-265 lbs. He reports no issues with appetite and no symptoms of fluid overload.   Recent ED Visit (past 6 months): none  Guideline-Directed Medical Therapy/Evidence Based Medicine ACE/ARB/ARNI: Sacubitril/valsartan 24/26 mg BID Beta Blocker: Carvedilol 6.25 mg twice daily Aldosterone Antagonist: Spironolactone 25 mg daily Diuretic: Furosemide 40 mg daily SGLT2i: Empagliflozin 25 mg daily  Adherence Assessment Do you ever forget to take your medication? [] Yes [x] No  Do you ever skip doses due to side effects? [] Yes [x] No  Do you have trouble affording your medicines? [] Yes [x] No  Are you ever unable to pick up your medication due to transportation difficulties? [] Yes [x] No  Do you ever stop taking your medications because you don't believe they are helping? [] Yes [x] No  Do you check your weight daily? [] Yes [x] No (weekly)  Adherence strategy: Pill box Barriers to obtaining medications: none reported  Diagnostics ECHO: Date 01/03/2023, EF 40-45%, mild GHK with severe hypokinesis of anterior wall, G1DD  Vitals    01/09/2023    8:25 AM 12/06/2022    8:41 AM 07/15/2022    8:06 AM  Vitals with BMI  Height   5\' 11"   Weight 265 lbs 265 lbs 3 oz 274 lbs 6 oz  BMI   38.28  Systolic 103 110 161  Diastolic 70 63 70  Pulse 64 66 66     Recent Labs    Latest Ref Rng & Units 08/24/2021   10:22 AM 08/06/2021    9:09 AM 07/27/2021    9:49 AM  BMP  Glucose 70 - 99 mg/dL 096  045  409   BUN 8 - 23 mg/dL 22  19  20    Creatinine 0.61 - 1.24 mg/dL 8.11  9.14  7.82   Sodium 135 - 145 mmol/L 135  134   137   Potassium 3.5 - 5.1 mmol/L 4.2  4.1  4.3   Chloride 98 - 111 mmol/L 104  100  100   CO2 22 - 32 mmol/L 25  27  28    Calcium 8.9 - 10.3 mg/dL 8.6  8.6  9.2     Past Medical History 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   CHF (congestive heart failure) (HCC)    Diabetes mellitus without complication (HCC)    GERD (gastroesophageal reflux disease)    Hypertension    Iron deficiency anemia    Obesity    Osteoarthritis of hip    right   Peptic ulcer disease     Plan CHF Continue carvedilol, Jardiance, Entresto, furosemide, and spironolactone Consider titrating Entresto as BP allows Encourage daily weights   HLD LDL 10/16/2022 was 86 on pravastatin 80 mg daily Consider increasing statin intensity to atorvastatin 40 mg daily given age and T2DM history  AF Continue amiodarone, apixaban, and carvedilol   DM A1c 10/16/2022 was 6.4% Continue Mounjaro and Jardiance  Time spent: 10 minutes  Celene Squibb, PharmD PGY1 Pharmacy Resident 01/09/2023 8:37 AM

## 2023-01-09 NOTE — Progress Notes (Signed)
Patient ID: William Dy., male    DOB: 06/09/1956, 67 y.o.   MRN: [id number]  Primary cardiologist: Julien Nordmann, MD (last seen 11/23; returns 11/24) PCP: Lynnea Ferrier, MD (last seen 03/24)  HPI  William [name] is a 67 y/o male with a history of DM, HTN, GERD, PUD, anxiety, anemia, gastric bypass and heart failure. Previous gastric bypass for weight of 500 pounds. Received the [employer] covid vaccine 03/21 and had a severe reaction and ended up in the hospital with HF/ AF. Subsequently cardioverted 03/21.  Echo 01/03/23: EF 40-45% along with mild William, mild LAE, severe calcification of aortic valve with moderate AS. Echo 01/23/21: EF of 45% along with LAE. Echo 11/26/19: EF of <20% along with William.   Has not been admitted or been in the ED in the last 6 months.   He presents today with a chief complaint of a HF follow-up visit. Currently endorses some anxiety after reading his echo report and has since stopped taking his calcium due to the severe calcification on his aortic valve. Denies difficulty sleeping, dizziness, abdominal distention, palpitations, pedal edema, chest pain, SOB, cough, fatigue or weight gain.   Has been working out in his yard often and enjoys that.   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   CHF (congestive heart failure) (HCC)    Diabetes mellitus without complication (HCC)    GERD (gastroesophageal reflux disease)    Hypertension    Iron deficiency anemia    Obesity    Osteoarthritis of hip    right   Peptic ulcer disease    Past Surgical History:  Procedure Laterality Date   CARDIOVERSION N/A 11/26/2019   Procedure: CARDIOVERSION;  Surgeon: Dalia Heading, MD;  Location: [facility] ORS;  Service: Cardiovascular;  Laterality: N/A;   COLONOSCOPY     ESOPHAGOGASTRODUODENOSCOPY     GASTRIC BYPASS     2009   NASAL SINUS SURGERY     TOTAL HIP ARTHROPLASTY Right 08/20/2016   Procedure: TOTAL HIP  ARTHROPLASTY ANTERIOR APPROACH;  Surgeon: Kennedy Bucker, MD;  Location: [facility] ORS;  Service: Orthopedics;  Laterality: Right;   Family History  Problem Relation Age of Onset   Dementia Mother    Diabetes Father    Social History   Tobacco Use   Smoking status: Never   Smokeless tobacco: Never  Substance Use Topics   Alcohol use: No   Allergies  Allergen Reactions   Morphine And Related Hives   Nsaids Other (See Comments)    Gastric bypass   Opium Hives    Opiate drip when had gastric bypass drug unknown (morphine drip)   Prior to Admission medications   Medication Sig Start Date End Date Taking? Authorizing Provider  amiodarone (PACERONE) 200 MG tablet Take 200 mg by mouth daily.   Yes [provider]  apixaban (ELIQUIS) 5 MG TABS tablet Take 1 tablet (5 mg total) by mouth 2 (two) times daily. 02/21/22  Yes Clarisa Kindred A, FNP  carvedilol (COREG) 6.25 MG tablet Take 1 tablet (6.25 mg total) by mouth 2 (two) times daily. 05/06/22  Yes Persis Graffius A, FNP  Cyanocobalamin (B-12) 2500 MCG TABS Take 5,000 mcg by mouth daily.    Yes [provider]  FLUoxetine (PROZAC) 20 MG capsule Take 40 mg by mouth every evening. 07/20/16  Yes [provider]  furosemide (LASIX) 20 MG tablet  Take 40 mg by mouth daily.   Yes [provider]  JARDIANCE 25 MG TABS tablet Take 25 mg by mouth daily. 11/02/19  Yes [provider]  metFORMIN (GLUCOPHAGE) 1000 MG tablet Take 1,000 mg by mouth 2 (two) times daily with a meal.    Yes [provider]  Multiple Vitamin (MULTIVITAMIN WITH MINERALS) TABS tablet Take 1 tablet by mouth daily.   Yes [provider]  pantoprazole (PROTONIX) 40 MG tablet Take 40 mg by mouth every evening.    Yes [provider]  pravastatin (PRAVACHOL) 40 MG tablet Take 40 mg by mouth at bedtime.  09/22/19  Yes [provider]  sacubitril-valsartan (ENTRESTO) 24-26 MG Take 1 tablet by mouth 2 (two) times  daily.   Yes [provider]  spironolactone (ALDACTONE) 25 MG tablet TAKE 1 TABLET BY MOUTH DAILY 10/15/22  Yes Siarra Gilkerson A, FNP  tirzepatide [facility]) 2.5 MG/0.5ML Pen Inject 10 mg into the skin once a week. Tuesdays   Yes [provider]   Review of Systems  Constitutional:  Negative for appetite change and fatigue.  HENT:  Negative for congestion, postnasal drip and sore throat.   Eyes: Negative.   Respiratory:  Negative for cough, chest tightness, shortness of breath and wheezing.   Cardiovascular:  Negative for chest pain, palpitations and leg swelling.  Gastrointestinal:  Negative for abdominal distention and abdominal pain.  Endocrine: Negative.   Genitourinary: Negative.   Musculoskeletal:  Negative for back pain and neck pain.  Skin: Negative.   Allergic/Immunologic: Negative.   Neurological:  Negative for dizziness and light-headedness.  Hematological:  Negative for adenopathy. Does not bruise/bleed easily.  Psychiatric/Behavioral:  Negative for dysphoric mood and sleep disturbance (sleeping on 1 pillow). The patient is nervous/anxious.    Vitals:   01/09/23 0825  BP: 103/70  Pulse: 64  Resp: 14  SpO2: 100%  Weight: 265 lb (120.2 kg)   Wt Readings from Last 3 Encounters:  01/09/23 265 lb (120.2 kg)  12/06/22 265 lb 3.2 oz (120.3 kg)  07/15/22 274 lb 6 oz (124.5 kg)   Lab Results  Component Value Date   CREATININE 0.90 08/24/2021   CREATININE 0.87 08/06/2021   CREATININE 0.96 07/27/2021   Physical Exam Vitals and nursing note reviewed.  Constitutional:      Appearance: Normal appearance.  HENT:     Head: Normocephalic and atraumatic.  Cardiovascular:     Rate and Rhythm: Normal rate and regular rhythm.     Heart sounds: Murmur (I/VI) heard.  Pulmonary:     Effort: Pulmonary effort is normal. No respiratory distress.     Breath sounds: No wheezing or rales.  Abdominal:     General: Abdomen is flat. There is no distension.      Palpations: Abdomen is soft.  Musculoskeletal:        General: No tenderness.     Cervical back: Normal range of motion and neck supple.     Right lower leg: No edema.     Left lower leg: No edema.  Skin:    General: Skin is warm and dry.  Neurological:     General: No focal deficit present.     Mental Status: He is alert and oriented to person, place, and time.  Psychiatric:        Mood and Affect: Mood normal.        Behavior: Behavior normal.        Thought Content: Thought content normal.  Assessment & Plan:  1: NICM with mildly reduced ejection fraction- - NYHA I - euvolemic today - weighing daily; reminded to call for an overnight weight gain of >2 pounds or a weekly weight gain of >5 pounds - weight unchanged from last visit here 1 month ago - etiology likely reaction to J&J vaccine 03/21 with subsequent AF - Echo 01/03/23: EF 40-45% along with mild William, mild LAE, severe calcification of aortic valve with moderate AS. Echo 01/23/21: EF of 45% along with LAE. Echo 11/26/19: EF of <20% along with William.  - not adding salt and has been trying to keep daily sodium intake to 2000mg / day - saw cardiology Mariah Milling) on 11/23 - continue carvedilol 6.25mg  BID - continue jardiance 25mg  daily - continue entresto 24/26mg  BID; unable to titrate due to BP - continue spironolactone 25mg  daily - only taking his furosemide if he needs it - BNP 11/25/19 was 806.0 - PharmD reconciled meds w/ patient  2: HTN- - BP 103/70 - saw PCP Graciela Husbands) 3/24 - BMP 10/16/22 showed sodium 141, potassium 4.3, creatinine 0.8 & GFR 98  3: DM- - A1c 10/16/22 was 6.4% - continues to take mounjaro 10mg  weekly - had gastric bypass in 2009 when he weighed in the 500's  4: Paroxysmal Atrial fibrillation- - cardioverted 11/2019 - continue amiodarone 200mg  daily - continue apixaban 5mg  BID   Will discuss recent echo results further with Dr. Mariah Milling and get back with patient with plan.

## 2023-01-10 ENCOUNTER — Encounter: Payer: Self-pay | Admitting: Family

## 2023-01-10 ENCOUNTER — Encounter: Payer: Managed Care, Other (non HMO) | Admitting: Family

## 2023-02-11 ENCOUNTER — Other Ambulatory Visit: Payer: Self-pay | Admitting: Family

## 2023-08-12 ENCOUNTER — Other Ambulatory Visit: Payer: Self-pay | Admitting: Family

## 2023-08-19 ENCOUNTER — Emergency Department: Payer: Managed Care, Other (non HMO)

## 2023-08-19 ENCOUNTER — Other Ambulatory Visit: Payer: Self-pay

## 2023-08-19 DIAGNOSIS — I2489 Other forms of acute ischemic heart disease: Secondary | ICD-10-CM | POA: Diagnosis present

## 2023-08-19 DIAGNOSIS — Z833 Family history of diabetes mellitus: Secondary | ICD-10-CM

## 2023-08-19 DIAGNOSIS — Z7984 Long term (current) use of oral hypoglycemic drugs: Secondary | ICD-10-CM

## 2023-08-19 DIAGNOSIS — K219 Gastro-esophageal reflux disease without esophagitis: Secondary | ICD-10-CM | POA: Diagnosis present

## 2023-08-19 DIAGNOSIS — E119 Type 2 diabetes mellitus without complications: Secondary | ICD-10-CM | POA: Diagnosis present

## 2023-08-19 DIAGNOSIS — J069 Acute upper respiratory infection, unspecified: Secondary | ICD-10-CM | POA: Diagnosis present

## 2023-08-19 DIAGNOSIS — Z818 Family history of other mental and behavioral disorders: Secondary | ICD-10-CM

## 2023-08-19 DIAGNOSIS — I447 Left bundle-branch block, unspecified: Secondary | ICD-10-CM | POA: Diagnosis present

## 2023-08-19 DIAGNOSIS — Z7985 Long-term (current) use of injectable non-insulin antidiabetic drugs: Secondary | ICD-10-CM

## 2023-08-19 DIAGNOSIS — Z9884 Bariatric surgery status: Secondary | ICD-10-CM

## 2023-08-19 DIAGNOSIS — Z7901 Long term (current) use of anticoagulants: Secondary | ICD-10-CM

## 2023-08-19 DIAGNOSIS — D509 Iron deficiency anemia, unspecified: Secondary | ICD-10-CM | POA: Diagnosis present

## 2023-08-19 DIAGNOSIS — E669 Obesity, unspecified: Secondary | ICD-10-CM | POA: Diagnosis present

## 2023-08-19 DIAGNOSIS — F419 Anxiety disorder, unspecified: Secondary | ICD-10-CM | POA: Diagnosis present

## 2023-08-19 DIAGNOSIS — I420 Dilated cardiomyopathy: Secondary | ICD-10-CM | POA: Diagnosis present

## 2023-08-19 DIAGNOSIS — Z85048 Personal history of other malignant neoplasm of rectum, rectosigmoid junction, and anus: Secondary | ICD-10-CM

## 2023-08-19 DIAGNOSIS — Z885 Allergy status to narcotic agent status: Secondary | ICD-10-CM

## 2023-08-19 DIAGNOSIS — I110 Hypertensive heart disease with heart failure: Secondary | ICD-10-CM | POA: Diagnosis not present

## 2023-08-19 DIAGNOSIS — Z96641 Presence of right artificial hip joint: Secondary | ICD-10-CM | POA: Diagnosis present

## 2023-08-19 DIAGNOSIS — Z20822 Contact with and (suspected) exposure to covid-19: Secondary | ICD-10-CM | POA: Diagnosis present

## 2023-08-19 DIAGNOSIS — Z8711 Personal history of peptic ulcer disease: Secondary | ICD-10-CM

## 2023-08-19 DIAGNOSIS — R0602 Shortness of breath: Secondary | ICD-10-CM | POA: Diagnosis not present

## 2023-08-19 DIAGNOSIS — I4819 Other persistent atrial fibrillation: Secondary | ICD-10-CM | POA: Diagnosis present

## 2023-08-19 DIAGNOSIS — Z6835 Body mass index (BMI) 35.0-35.9, adult: Secondary | ICD-10-CM

## 2023-08-19 DIAGNOSIS — I5043 Acute on chronic combined systolic (congestive) and diastolic (congestive) heart failure: Secondary | ICD-10-CM | POA: Diagnosis present

## 2023-08-19 DIAGNOSIS — I080 Rheumatic disorders of both mitral and aortic valves: Secondary | ICD-10-CM | POA: Diagnosis present

## 2023-08-19 DIAGNOSIS — Z886 Allergy status to analgesic agent status: Secondary | ICD-10-CM

## 2023-08-19 DIAGNOSIS — R000 Tachycardia, unspecified: Secondary | ICD-10-CM | POA: Diagnosis present

## 2023-08-19 DIAGNOSIS — Z79899 Other long term (current) drug therapy: Secondary | ICD-10-CM

## 2023-08-19 DIAGNOSIS — E0580 Other thyrotoxicosis without thyrotoxic crisis or storm: Secondary | ICD-10-CM | POA: Diagnosis present

## 2023-08-19 DIAGNOSIS — G4700 Insomnia, unspecified: Secondary | ICD-10-CM | POA: Diagnosis present

## 2023-08-19 DIAGNOSIS — E876 Hypokalemia: Secondary | ICD-10-CM | POA: Diagnosis present

## 2023-08-19 DIAGNOSIS — I2510 Atherosclerotic heart disease of native coronary artery without angina pectoris: Secondary | ICD-10-CM | POA: Diagnosis present

## 2023-08-19 DIAGNOSIS — I4892 Unspecified atrial flutter: Secondary | ICD-10-CM | POA: Diagnosis present

## 2023-08-19 DIAGNOSIS — T462X5A Adverse effect of other antidysrhythmic drugs, initial encounter: Secondary | ICD-10-CM | POA: Diagnosis present

## 2023-08-19 DIAGNOSIS — I959 Hypotension, unspecified: Secondary | ICD-10-CM | POA: Diagnosis present

## 2023-08-19 LAB — BASIC METABOLIC PANEL
Anion gap: 10 (ref 5–15)
BUN: 24 mg/dL — ABNORMAL HIGH (ref 8–23)
CO2: 20 mmol/L — ABNORMAL LOW (ref 22–32)
Calcium: 8.4 mg/dL — ABNORMAL LOW (ref 8.9–10.3)
Chloride: 104 mmol/L (ref 98–111)
Creatinine, Ser: 0.95 mg/dL (ref 0.61–1.24)
GFR, Estimated: >60 mL/min (ref >60)
Glucose, Bld: 114 mg/dL — ABNORMAL HIGH (ref 70–99)
Potassium: 4.6 mmol/L (ref 3.5–5.1)
Sodium: 134 mmol/L — ABNORMAL LOW (ref 135–145)

## 2023-08-19 LAB — TROPONIN I (HIGH SENSITIVITY): Troponin I (High Sensitivity): 56 ng/L — ABNORMAL HIGH (ref <18)

## 2023-08-19 LAB — BRAIN NATRIURETIC PEPTIDE: B Natriuretic Peptide: 1995.5 pg/mL — ABNORMAL HIGH (ref 0.0–100.0)

## 2023-08-19 LAB — CBC
HCT: 38.3 % — ABNORMAL LOW (ref 39.0–52.0)
Hemoglobin: 12.7 g/dL — ABNORMAL LOW (ref 13.0–17.0)
MCH: 29.8 pg (ref 26.0–34.0)
MCHC: 33.2 g/dL (ref 30.0–36.0)
MCV: 89.9 fL (ref 80.0–100.0)
Platelets: 281 10*3/uL (ref 150–400)
RBC: 4.26 MIL/uL (ref 4.22–5.81)
RDW: 13.1 % (ref 11.5–15.5)
WBC: 10.3 10*3/uL (ref 4.0–10.5)
nRBC: 0 % (ref 0.0–0.2)

## 2023-08-19 NOTE — ED Triage Notes (Addendum)
Pt has had dry cough, shortness of breath and lower extremity swelling for the past week. Pt reports he has hx heart failure. Able to speak in complete sentences, reports some intermittent chest tightness. Pt reports taking extra dose of furosemide tonight.

## 2023-08-19 NOTE — ED Provider Notes (Incomplete)
[facility] Provider Note    Event Date/Time   First MD Initiated Contact with Patient 08/19/23 2318     (approximate)   History   Leg Swelling   HPI  William [name]. is a 67 y.o. male with history of hypertension, diabetes, CHF who presents to the emergency department with 1 week of shortness of breath, dyspnea on exertion, orthopnea, increasing leg swelling now into his scrotum.  He states symptoms significantly worsened today.  He took a total of 120 mg of oral Lasix at home but this did not increase his urination.  He states he has been urinating.  He is on furosemide 40 mg daily which he has been compliant with.  He does report about a week ago he had postnasal drip and a dry cough.  No fever.  States his chest feels "raw "from coughing.  He is worried he has a heart failure exacerbation because this is how it felt when he was admitted previously and initially diagnosed.   History provided by patient.    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   CHF (congestive heart failure) (HCC)    Diabetes mellitus without complication (HCC)    GERD (gastroesophageal reflux disease)    Hypertension    Iron deficiency anemia    Obesity    Osteoarthritis of hip    right   Peptic ulcer disease     Past Surgical History:  Procedure Laterality Date   CARDIOVERSION N/A 11/26/2019   Procedure: CARDIOVERSION;  Surgeon: Dalia Heading, MD;  Location: [facility] ORS;  Service: Cardiovascular;  Laterality: N/A;   COLONOSCOPY     ESOPHAGOGASTRODUODENOSCOPY     GASTRIC BYPASS     2009   NASAL SINUS SURGERY     TOTAL HIP ARTHROPLASTY Right 08/20/2016   Procedure: TOTAL HIP ARTHROPLASTY ANTERIOR APPROACH;  Surgeon: Kennedy Bucker, MD;  Location: [facility] ORS;  Service: Orthopedics;  Laterality: Right;    MEDICATIONS:  Prior to Admission medications   Medication Sig Start Date End Date Taking? Authorizing Provider   amiodarone (PACERONE) 200 MG tablet Take 200 mg by mouth daily.    [provider]  carvedilol (COREG) 6.25 MG tablet TAKE 1 TABLET BY MOUTH TWICE  DAILY 02/11/23   Clarisa Kindred A, FNP  Cyanocobalamin (B-12) 2500 MCG TABS Take 5,000 mcg by mouth daily.     [provider]  ELIQUIS 5 MG TABS tablet TAKE 1 TABLET BY MOUTH TWICE  DAILY 02/11/23   Clarisa Kindred A, FNP  FLUoxetine (PROZAC) 20 MG capsule Take 40 mg by mouth every evening. 07/20/16   [provider]  furosemide (LASIX) 20 MG tablet Take 40 mg by mouth daily.    [provider]  JARDIANCE 25 MG TABS tablet Take 25 mg by mouth daily. 11/02/19   [provider]  metFORMIN (GLUCOPHAGE) 1000 MG tablet Take 1,000 mg by mouth 2 (two) times daily with a meal.     [provider]  Multiple Vitamin (MULTIVITAMIN WITH MINERALS) TABS tablet Take 1 tablet by mouth daily.    [provider]  pantoprazole (PROTONIX) 40 MG tablet Take 40 mg by mouth every evening.     [provider]  pravastatin (PRAVACHOL) 40 MG tablet Take 40 mg by mouth at bedtime.  09/22/19   [provider]  sacubitril-valsartan (ENTRESTO) 24-26 MG Take 1 tablet by mouth 2 (  two) times daily.    [provider]  spironolactone (ALDACTONE) 25 MG tablet TAKE 1 TABLET BY MOUTH DAILY 08/12/23   Clarisa Kindred A, FNP  tirzepatide [facility]) 2.5 MG/0.5ML Pen Inject 10 mg into the skin once a week. Tuesdays    [provider]    Physical Exam   Triage Vital Signs: ED Triage Vitals  Encounter Vitals Group     BP 08/19/23 2231 92/67     Systolic BP Percentile --      Diastolic BP Percentile --      Pulse Rate 08/19/23 2231 83     Resp 08/19/23 2231 18     Temp 08/19/23 2231 98.4 F (36.9 C)     Temp Source 08/19/23 2231 Oral     SpO2 08/19/23 2231 95 %     Weight 08/19/23 2229 250 lb (113.4 kg)     Height 08/19/23 2229 5\' 11"  (1.803 m)     Head Circumference --      Peak Flow --       Pain Score 08/19/23 2229 5     Pain Loc --      Pain Education --      Exclude from Growth Chart --     Most recent vital signs: Vitals:   08/20/23 0230 08/20/23 0305  BP:  93/79  Pulse: 78 (!) 117  Resp: 19 (!) 36  Temp:    SpO2: 92% 97%    CONSTITUTIONAL: Alert, responds appropriately to questions. Well-appearing; well-nourished HEAD: Normocephalic, atraumatic EYES: Conjunctivae clear, pupils appear equal, sclera nonicteric ENT: normal nose; moist mucous membranes NECK: Supple, normal ROM CARD: RRR; S1 and S2 appreciated RESP: Normal chest excursion without splinting or tachypnea; breath sounds clear and equal bilaterally; no wheezes, no rhonchi, no rales, no hypoxia or respiratory distress, speaking full sentences ABD/GI: Non-distended; soft, non-tender, no rebound, no guarding, no peritoneal signs BACK: The back appears normal EXT: Normal ROM in all joints; no deformity noted, patient has 3+ edema in bilateral distal lower extremities to the scrotum SKIN: Normal color for age and race; warm; no rash on exposed skin NEURO: Moves all extremities equally, normal speech PSYCH: The patient's mood and manner are appropriate.   ED Results / Procedures / Treatments   LABS: (all labs ordered are listed, but only abnormal results are displayed) Labs Reviewed  BASIC METABOLIC PANEL - Abnormal; Notable for the following components:      Result Value   Sodium 134 (*)    CO2 20 (*)    Glucose, Bld 114 (*)    BUN 24 (*)    Calcium 8.4 (*)    All other components within normal limits  CBC - Abnormal; Notable for the following components:   Hemoglobin 12.7 (*)    HCT 38.3 (*)    All other components within normal limits  BRAIN NATRIURETIC PEPTIDE - Abnormal; Notable for the following components:   B Natriuretic Peptide 1,995.5 (*)    All other components within normal limits  D-DIMER, QUANTITATIVE - Abnormal; Notable for the following components:   D-Dimer, Quant 0.65 (*)     All other components within normal limits  TROPONIN I (HIGH SENSITIVITY) - Abnormal; Notable for the following components:   Troponin I (High Sensitivity) 56 (*)    All other components within normal limits  TROPONIN I (HIGH SENSITIVITY) - Abnormal; Notable for the following components:   Troponin I (High Sensitivity) 46 (*)    All other components within normal  limits  RESP PANEL BY RT-PCR (RSV, FLU A&B, COVID)  RVPGX2  HIV ANTIBODY (ROUTINE TESTING W REFLEX)  HEMOGLOBIN A1C  SEDIMENTATION RATE  BASIC METABOLIC PANEL     EKG:  EKG Interpretation Date/Time:  Tuesday August 19 2023 22:34:42 EST Ventricular Rate:  82 PR Interval:  170 QRS Duration:  176 QT Interval:  454 QTC Calculation: 530 R Axis:   -49  Text Interpretation: Normal sinus rhythm Left axis deviation Left bundle branch block Abnormal ECG No significant change since last tracing Confirmed by Rochele Raring [phone]) on 08/19/2023 11:28:48 PM         RADIOLOGY: My personal review and interpretation of imaging: Chest x-ray clear.  I have personally reviewed all radiology reports.   DG Chest 1 View  Result Date: 08/19/2023 CLINICAL DATA:  Chest pain, cough EXAM: CHEST  1 VIEW COMPARISON:  11/25/2019 FINDINGS: Lungs are clear. Suspected trace left pleural effusion. No pneumothorax. The heart is top-normal in size. IMPRESSION: Suspected trace left pleural effusion. Electronically Signed   By: Charline Bills M.D.   On: 08/19/2023 22:54     PROCEDURES:  Critical Care performed: No      .1-3 Lead EKG Interpretation  Performed by: Leiby Pigeon, Layla Maw, DO Authorized by: Dacen Frayre, Layla Maw, DO     Interpretation: normal     ECG rate:  83   ECG rate assessment: normal     Rhythm: sinus rhythm     Ectopy: none     Conduction: normal       IMPRESSION / MDM / ASSESSMENT AND PLAN / ED COURSE  I reviewed the triage vital signs and the nursing notes.    Patient here with shortness of breath worse with  exertion, orthopnea, leg swelling with history of CHF.  The patient is on the cardiac monitor to evaluate for evidence of arrhythmia and/or significant heart rate changes.   DIFFERENTIAL DIAGNOSIS (includes but not limited to):   CHF exacerbation, ACS, PE, pneumonia, viral URI   Patient's presentation is most consistent with acute presentation with potential threat to life or bodily function.   PLAN: Patient's labs show no leukocytosis.  Normal hemoglobin.  Normal creatinine.  Troponin slightly elevated at 56 but downtrending to 46 on repeat.  BNP however is significantly elevated at 1995.  Chest x-ray reviewed and interpreted by myself and the radiologist and shows trace left pleural effusion but no edema.  Clinically I suspect that he has a heart failure exacerbation today.  He states he has not been able to sleep in several days because he wakes up extremely short of breath.  I feel he will need admission to the hospital for IV diuresis given he has tried increasing his Lasix at home without relief.  Patient is comfortable with this plan.  Given recent postnasal drip and dry cough, will obtain COVID and flu swab but no pneumonia on chest x-ray.   MEDICATIONS GIVEN IN ED: Medications  carvedilol (COREG) tablet 6.25 mg (0 mg Oral Hold 08/20/23 0342)  pravastatin (PRAVACHOL) tablet 40 mg (has no administration in time range)  sacubitril-valsartan (ENTRESTO) 24-26 mg per tablet (0 tablets Oral Hold 08/20/23 0341)  spironolactone (ALDACTONE) tablet 25 mg (has no administration in time range)  apixaban (ELIQUIS) tablet 5 mg (0 mg Oral Hold 08/20/23 0343)  acetaminophen (TYLENOL) tablet 650 mg (has no administration in time range)    Or  acetaminophen (TYLENOL) suppository 650 mg (has no administration in time range)  ondansetron (ZOFRAN) tablet 4 mg (  has no administration in time range)    Or  ondansetron (ZOFRAN) injection 4 mg (has no administration in time range)  insulin aspart (novoLOG)  injection 0-15 Units (has no administration in time range)  insulin aspart (novoLOG) injection 0-5 Units (has no administration in time range)  furosemide (LASIX) 200 mg in dextrose 5 % 100 mL (2 mg/mL) infusion (10 mg/hr Intravenous New Bag/Given 08/20/23 0352)  chlorpheniramine-HYDROcodone (TUSSIONEX) 10-8 MG/5ML suspension 5 mL (has no administration in time range)  fluticasone (FLONASE) 50 MCG/ACT nasal spray 1 spray (has no administration in time range)  furosemide (LASIX) injection 40 mg (40 mg Intravenous Given 08/20/23 0113)     ED COURSE: COVID, flu and RSV negative.   CONSULTS:  Consulted and discussed patient's case with hospitalist, Dr. Para March.  I have recommended admission and consulting physician agrees and will place admission orders.  Patient (and family if present) agree with this plan.   I reviewed all nursing notes, vitals, pertinent previous records.  All labs, EKGs, imaging ordered have been independently reviewed and interpreted by myself.    OUTSIDE RECORDS REVIEWED: Reviewed recent cardiology notes.  5:44 AM  Pt has become tachypneic and tachycardic.  He has had coughing fits and has been anxious per nursing staff.  I am concerned about possible PE.  D-dimer pending.  Updated hospitalist.  6:20 AM  Pt's heart rate is still in the 110's.  He had 1 blood pressure with systolic in the 80s.  On recheck it is 96 systolic.  He states that he had a blood pressure recently in the low 100s and his doctor talked about taking him off of spironolactone.  Rectal temperature is 99.5.  He denies any infectious symptoms other than dry cough.  Bedside ultrasound shows no pericardial effusion and no signs of cardiogenic shock.  EF likely around 45 to 50%.  D-dimer elevated although age-adjusted it is negative.  Given abnormal vitals, will proceed with CTA of the chest.  Repeat EKG shows left bundle branch block, sinus tachycardia, no arrhythmia.  Discussed with Dr. Para March who  agrees.   FINAL CLINICAL IMPRESSION(S) / ED DIAGNOSES   Final diagnoses:  Acute on chronic congestive heart failure, unspecified heart failure type (HCC)     Rx / DC Orders   ED Discharge Orders     None        Note:  This document was prepared using Dragon voice recognition software and may include unintentional dictation errors.   Ninfa Giannelli, Layla Maw, DO 08/20/23 3244    Ebbie Sorenson, Layla Maw, DO 08/20/23 0621    Johnnetta Holstine, Layla Maw, DO 08/20/23 [phone]

## 2023-08-20 ENCOUNTER — Inpatient Hospital Stay
Admission: EM | Admit: 2023-08-20 | Discharge: 2023-08-24 | DRG: 291 | Disposition: A | Discharge status: Home / Self Care | Payer: Managed Care, Other (non HMO) | Attending: Internal Medicine | Admitting: Internal Medicine

## 2023-08-20 ENCOUNTER — Other Ambulatory Visit: Payer: Self-pay

## 2023-08-20 ENCOUNTER — Inpatient Hospital Stay: Payer: Managed Care, Other (non HMO)

## 2023-08-20 ENCOUNTER — Inpatient Hospital Stay (HOSPITAL_COMMUNITY)
Admit: 2023-08-20 | Discharge: 2023-08-20 | Disposition: A | Discharge status: Home / Self Care | Payer: Managed Care, Other (non HMO) | Attending: Internal Medicine | Admitting: Internal Medicine

## 2023-08-20 DIAGNOSIS — I509 Heart failure, unspecified: Principal | ICD-10-CM

## 2023-08-20 DIAGNOSIS — E876 Hypokalemia: Secondary | ICD-10-CM | POA: Diagnosis present

## 2023-08-20 DIAGNOSIS — I420 Dilated cardiomyopathy: Secondary | ICD-10-CM | POA: Diagnosis present

## 2023-08-20 DIAGNOSIS — I4892 Unspecified atrial flutter: Secondary | ICD-10-CM | POA: Diagnosis present

## 2023-08-20 DIAGNOSIS — Z96641 Presence of right artificial hip joint: Secondary | ICD-10-CM | POA: Diagnosis present

## 2023-08-20 DIAGNOSIS — I5021 Acute systolic (congestive) heart failure: Secondary | ICD-10-CM

## 2023-08-20 DIAGNOSIS — R0602 Shortness of breath: Secondary | ICD-10-CM | POA: Diagnosis present

## 2023-08-20 DIAGNOSIS — J069 Acute upper respiratory infection, unspecified: Secondary | ICD-10-CM | POA: Diagnosis present

## 2023-08-20 DIAGNOSIS — N5089 Other specified disorders of the male genital organs: Secondary | ICD-10-CM

## 2023-08-20 DIAGNOSIS — I4819 Other persistent atrial fibrillation: Secondary | ICD-10-CM | POA: Diagnosis present

## 2023-08-20 DIAGNOSIS — R000 Tachycardia, unspecified: Secondary | ICD-10-CM | POA: Diagnosis present

## 2023-08-20 DIAGNOSIS — I4891 Unspecified atrial fibrillation: Secondary | ICD-10-CM

## 2023-08-20 DIAGNOSIS — Z9884 Bariatric surgery status: Secondary | ICD-10-CM | POA: Diagnosis not present

## 2023-08-20 DIAGNOSIS — I959 Hypotension, unspecified: Secondary | ICD-10-CM | POA: Diagnosis present

## 2023-08-20 DIAGNOSIS — F419 Anxiety disorder, unspecified: Secondary | ICD-10-CM | POA: Diagnosis present

## 2023-08-20 DIAGNOSIS — I480 Paroxysmal atrial fibrillation: Secondary | ICD-10-CM | POA: Diagnosis not present

## 2023-08-20 DIAGNOSIS — Z7901 Long term (current) use of anticoagulants: Secondary | ICD-10-CM | POA: Diagnosis not present

## 2023-08-20 DIAGNOSIS — I5043 Acute on chronic combined systolic (congestive) and diastolic (congestive) heart failure: Secondary | ICD-10-CM | POA: Diagnosis present

## 2023-08-20 DIAGNOSIS — I2489 Other forms of acute ischemic heart disease: Secondary | ICD-10-CM | POA: Diagnosis present

## 2023-08-20 DIAGNOSIS — I350 Nonrheumatic aortic (valve) stenosis: Secondary | ICD-10-CM | POA: Diagnosis not present

## 2023-08-20 DIAGNOSIS — J9601 Acute respiratory failure with hypoxia: Secondary | ICD-10-CM

## 2023-08-20 DIAGNOSIS — I5023 Acute on chronic systolic (congestive) heart failure: Secondary | ICD-10-CM | POA: Diagnosis not present

## 2023-08-20 DIAGNOSIS — I080 Rheumatic disorders of both mitral and aortic valves: Secondary | ICD-10-CM | POA: Diagnosis present

## 2023-08-20 DIAGNOSIS — R570 Cardiogenic shock: Secondary | ICD-10-CM

## 2023-08-20 DIAGNOSIS — T462X5A Adverse effect of other antidysrhythmic drugs, initial encounter: Secondary | ICD-10-CM | POA: Diagnosis present

## 2023-08-20 DIAGNOSIS — D509 Iron deficiency anemia, unspecified: Secondary | ICD-10-CM | POA: Diagnosis present

## 2023-08-20 DIAGNOSIS — K219 Gastro-esophageal reflux disease without esophagitis: Secondary | ICD-10-CM | POA: Diagnosis present

## 2023-08-20 DIAGNOSIS — Z20822 Contact with and (suspected) exposure to covid-19: Secondary | ICD-10-CM | POA: Diagnosis present

## 2023-08-20 DIAGNOSIS — E119 Type 2 diabetes mellitus without complications: Secondary | ICD-10-CM

## 2023-08-20 DIAGNOSIS — E669 Obesity, unspecified: Secondary | ICD-10-CM | POA: Diagnosis present

## 2023-08-20 DIAGNOSIS — I2510 Atherosclerotic heart disease of native coronary artery without angina pectoris: Secondary | ICD-10-CM | POA: Diagnosis present

## 2023-08-20 DIAGNOSIS — I110 Hypertensive heart disease with heart failure: Secondary | ICD-10-CM | POA: Diagnosis present

## 2023-08-20 DIAGNOSIS — I447 Left bundle-branch block, unspecified: Secondary | ICD-10-CM | POA: Diagnosis present

## 2023-08-20 DIAGNOSIS — E0580 Other thyrotoxicosis without thyrotoxic crisis or storm: Secondary | ICD-10-CM | POA: Diagnosis present

## 2023-08-20 LAB — BASIC METABOLIC PANEL
Anion gap: 12 (ref 5–15)
Anion gap: 7 (ref 5–15)
BUN: 23 mg/dL (ref 8–23)
BUN: 23 mg/dL (ref 8–23)
CO2: 22 mmol/L (ref 22–32)
CO2: 27 mmol/L (ref 22–32)
Calcium: 8 mg/dL — ABNORMAL LOW (ref 8.9–10.3)
Calcium: 8.3 mg/dL — ABNORMAL LOW (ref 8.9–10.3)
Chloride: 103 mmol/L (ref 98–111)
Chloride: 103 mmol/L (ref 98–111)
Creatinine, Ser: 1.04 mg/dL (ref 0.61–1.24)
Creatinine, Ser: 0.87 mg/dL (ref 0.61–1.24)
GFR, Estimated: >60 mL/min (ref >60)
GFR, Estimated: >60 mL/min (ref >60)
Glucose, Bld: 127 mg/dL — ABNORMAL HIGH (ref 70–99)
Glucose, Bld: 177 mg/dL — ABNORMAL HIGH (ref 70–99)
Potassium: 4.1 mmol/L (ref 3.5–5.1)
Potassium: 3.5 mmol/L (ref 3.5–5.1)
Sodium: 137 mmol/L (ref 135–145)
Sodium: 137 mmol/L (ref 135–145)

## 2023-08-20 LAB — ECHOCARDIOGRAM COMPLETE
AR max vel: 0.82 cm2
AV Area VTI: 0.97 cm2
AV Area mean vel: 0.88 cm2
AV Mean grad: 16 mm[Hg]
AV Peak grad: 31.5 mm[Hg]
Ao pk vel: 2.81 m/s
Area-P 1/2: 5.75 cm2
S' Lateral: 5.3 cm
Single Plane A4C EF: 6.8 %

## 2023-08-20 LAB — URINE DRUG SCREEN, QUALITATIVE (ARMC ONLY)
Amphetamines, Ur Screen: NOT DETECTED
Barbiturates, Ur Screen: NOT DETECTED
Benzodiazepine, Ur Scrn: NOT DETECTED
Cannabinoid 50 Ng, Ur ~~LOC~~: NOT DETECTED
Cocaine Metabolite,Ur ~~LOC~~: NOT DETECTED
MDMA (Ecstasy)Ur Screen: NOT DETECTED
Methadone Scn, Ur: NOT DETECTED
Opiate, Ur Screen: POSITIVE — AB
Phencyclidine (PCP) Ur S: NOT DETECTED
Tricyclic, Ur Screen: NOT DETECTED

## 2023-08-20 LAB — COOXEMETRY PANEL
Carboxyhemoglobin: 0.7 % (ref 0.5–1.5)
Methemoglobin: <0.7 % (ref 0.0–1.5)
O2 Saturation: 65.2 %
Total hemoglobin: 13.6 g/dL (ref 12.0–16.0)
Total oxygen content: 64.8 %

## 2023-08-20 LAB — RESP PANEL BY RT-PCR (RSV, FLU A&B, COVID)  RVPGX2
Influenza A by PCR: NEGATIVE
Influenza B by PCR: NEGATIVE
Resp Syncytial Virus by PCR: NEGATIVE
SARS Coronavirus 2 by RT PCR: NEGATIVE

## 2023-08-20 LAB — APTT: aPTT: 36 s (ref 24–36)

## 2023-08-20 LAB — LACTIC ACID, PLASMA: Lactic Acid, Venous: 1.1 mmol/L (ref 0.5–1.9)

## 2023-08-20 LAB — GLUCOSE, CAPILLARY: Glucose-Capillary: 137 mg/dL — ABNORMAL HIGH (ref 70–99)

## 2023-08-20 LAB — TROPONIN I (HIGH SENSITIVITY): Troponin I (High Sensitivity): 46 ng/L — ABNORMAL HIGH (ref <18)

## 2023-08-20 LAB — HEMOGLOBIN A1C
Hgb A1c MFr Bld: 6.1 % — ABNORMAL HIGH (ref 4.8–5.6)
Mean Plasma Glucose: 128.37 mg/dL

## 2023-08-20 LAB — SEDIMENTATION RATE: Sed Rate: 29 mm/h — ABNORMAL HIGH (ref 0–20)

## 2023-08-20 LAB — D-DIMER, QUANTITATIVE: D-Dimer, Quant: 0.65 ug{FEU}/mL — ABNORMAL HIGH (ref 0.00–0.50)

## 2023-08-20 LAB — T4, FREE: Free T4: 4.65 ng/dL — ABNORMAL HIGH (ref 0.61–1.12)

## 2023-08-20 LAB — HEPARIN LEVEL (UNFRACTIONATED): Heparin Unfractionated: >1.1 [IU]/mL — ABNORMAL HIGH (ref 0.30–0.70)

## 2023-08-20 LAB — CBG MONITORING, ED
Glucose-Capillary: 95 mg/dL (ref 70–99)
Glucose-Capillary: 154 mg/dL — ABNORMAL HIGH (ref 70–99)

## 2023-08-20 LAB — MAGNESIUM
Magnesium: 1.9 mg/dL (ref 1.7–2.4)
Magnesium: 2 mg/dL (ref 1.7–2.4)

## 2023-08-20 LAB — PHOSPHORUS
Phosphorus: 4.9 mg/dL — ABNORMAL HIGH (ref 2.5–4.6)
Phosphorus: 4.9 mg/dL — ABNORMAL HIGH (ref 2.5–4.6)

## 2023-08-20 LAB — PROTIME-INR
INR: 1.2 (ref 0.8–1.2)
Prothrombin Time: 15.8 s — ABNORMAL HIGH (ref 11.4–15.2)

## 2023-08-20 LAB — TSH: TSH: <0.01 u[IU]/mL — ABNORMAL LOW (ref 0.350–4.500)

## 2023-08-20 LAB — HIV ANTIBODY (ROUTINE TESTING W REFLEX): HIV Screen 4th Generation wRfx: NONREACTIVE

## 2023-08-20 MED ORDER — HYDROCOD POLI-CHLORPHE POLI ER 10-8 MG/5ML PO SUER
5.0000 mL | Freq: Two times a day (BID) | ORAL | Status: DC
  Administered 2023-08-20 – 2023-08-24 (×9): 5 mL via ORAL
  Filled 2023-08-20 (×9): qty 5

## 2023-08-20 MED ORDER — DIGOXIN 0.25 MG/ML IJ SOLN
0.2500 mg | Freq: Once | INTRAMUSCULAR | Status: AC
Start: 2023-08-20 — End: 2023-08-20
  Administered 2023-08-20: 0.25 mg via INTRAVENOUS
  Filled 2023-08-20: qty 2

## 2023-08-20 MED ORDER — SACUBITRIL-VALSARTAN 24-26 MG PO TABS: 1.0000 | ORAL_TABLET | Freq: Two times a day (BID) | ORAL | Status: DC

## 2023-08-20 MED ORDER — BENZONATATE 100 MG PO CAPS: 100.0000 mg | ORAL_CAPSULE | Freq: Three times a day (TID) | ORAL | Status: DC | PRN

## 2023-08-20 MED ORDER — AMIODARONE HCL IN DEXTROSE 360-4.14 MG/200ML-% IV SOLN
60.0000 mg/h | INTRAVENOUS | Status: DC
  Administered 2023-08-20 (×2): 60 mg/h via INTRAVENOUS
  Filled 2023-08-20 (×2): qty 200

## 2023-08-20 MED ORDER — AMIODARONE LOAD VIA INFUSION
150.0000 mg | Freq: Once | INTRAVENOUS | Status: AC
  Administered 2023-08-20: 150 mg via INTRAVENOUS
  Filled 2023-08-20: qty 83.34

## 2023-08-20 MED ORDER — ACETAMINOPHEN 325 MG PO TABS: 650.0000 mg | ORAL_TABLET | Freq: Four times a day (QID) | ORAL | Status: DC | PRN

## 2023-08-20 MED ORDER — CARVEDILOL 6.25 MG PO TABS: 6.2500 mg | ORAL_TABLET | Freq: Two times a day (BID) | ORAL | Status: DC

## 2023-08-20 MED ORDER — ONDANSETRON HCL 4 MG PO TABS: 4.0000 mg | ORAL_TABLET | Freq: Four times a day (QID) | ORAL | Status: DC | PRN

## 2023-08-20 MED ORDER — CHLORHEXIDINE GLUCONATE CLOTH 2 % EX PADS
6.0000 | MEDICATED_PAD | Freq: Every day | CUTANEOUS | Status: DC
  Administered 2023-08-21 – 2023-08-23 (×3): 6 via TOPICAL

## 2023-08-20 MED ORDER — SPIRONOLACTONE 25 MG PO TABS: 25.0000 mg | ORAL_TABLET | Freq: Every day | ORAL | Status: DC

## 2023-08-20 MED ORDER — ONDANSETRON HCL 4 MG/2ML IJ SOLN: 4.0000 mg | Freq: Four times a day (QID) | INTRAMUSCULAR | Status: DC | PRN

## 2023-08-20 MED ORDER — SODIUM CHLORIDE 0.9% FLUSH
10.0000 mL | Freq: Two times a day (BID) | INTRAVENOUS | Status: DC
  Administered 2023-08-20 – 2023-08-24 (×8): 10 mL

## 2023-08-20 MED ORDER — FUROSEMIDE 10 MG/ML IJ SOLN
40.0000 mg | Freq: Once | INTRAMUSCULAR | Status: AC
  Administered 2023-08-20: 40 mg via INTRAVENOUS
  Filled 2023-08-20: qty 4

## 2023-08-20 MED ORDER — FLUTICASONE PROPIONATE 50 MCG/ACT NA SUSP
1.0000 | Freq: Every day | NASAL | Status: DC
  Administered 2023-08-20 – 2023-08-24 (×5): 1 via NASAL
  Filled 2023-08-20 (×3): qty 16

## 2023-08-20 MED ORDER — SODIUM CHLORIDE 0.9% FLUSH: 10.0000 mL | INTRAVENOUS | Status: DC | PRN

## 2023-08-20 MED ORDER — HEPARIN (PORCINE) 25000 UT/250ML-% IV SOLN
1800.0000 [IU]/h | INTRAVENOUS | Status: DC
Start: 2023-08-20 — End: 2023-08-21
  Administered 2023-08-20: 1500 [IU]/h via INTRAVENOUS
  Administered 2023-08-21: 1800 [IU]/h via INTRAVENOUS
  Filled 2023-08-20 (×2): qty 250

## 2023-08-20 MED ORDER — PRAVASTATIN SODIUM 20 MG PO TABS
40.0000 mg | ORAL_TABLET | Freq: Every day | ORAL | Status: DC
  Administered 2023-08-20 – 2023-08-23 (×4): 40 mg via ORAL
  Filled 2023-08-20 (×4): qty 2

## 2023-08-20 MED ORDER — NOREPINEPHRINE BITARTRATE 1 MG/ML IV SOLN: 0.0100 ug/kg/min | INTRAVENOUS | Status: DC

## 2023-08-20 MED ORDER — SACUBITRIL-VALSARTAN 24-26 MG PO TABS
1.0000 | ORAL_TABLET | Freq: Two times a day (BID) | ORAL | Status: DC
  Filled 2023-08-20: qty 1

## 2023-08-20 MED ORDER — INSULIN ASPART 100 UNIT/ML IJ SOLN: 0.0000 [IU] | Freq: Every day | INTRAMUSCULAR | Status: DC

## 2023-08-20 MED ORDER — IOHEXOL 350 MG/ML SOLN
80.0000 mL | Freq: Once | INTRAVENOUS | Status: AC | PRN
  Administered 2023-08-20: 80 mL via INTRAVENOUS

## 2023-08-20 MED ORDER — ACETAMINOPHEN 650 MG RE SUPP: 650.0000 mg | Freq: Four times a day (QID) | RECTAL | Status: DC | PRN

## 2023-08-20 MED ORDER — NOREPINEPHRINE 4 MG/250ML-% IV SOLN
2.0000 ug/min | INTRAVENOUS | Status: DC
  Administered 2023-08-20: 2 ug/min via INTRAVENOUS
  Administered 2023-08-21: 5 ug/min via INTRAVENOUS
  Filled 2023-08-20 (×2): qty 250

## 2023-08-20 MED ORDER — APIXABAN 5 MG PO TABS
5.0000 mg | ORAL_TABLET | Freq: Two times a day (BID) | ORAL | Status: DC
  Administered 2023-08-20: 5 mg via ORAL
  Filled 2023-08-20: qty 1

## 2023-08-20 MED ORDER — FUROSEMIDE 10 MG/ML IJ SOLN
10.0000 mg/h | INTRAVENOUS | Status: DC
  Administered 2023-08-20 – 2023-08-22 (×3): 10 mg/h via INTRAVENOUS
  Filled 2023-08-20 (×4): qty 20

## 2023-08-20 MED ORDER — SODIUM CHLORIDE 0.9 % IV SOLN: 250.0000 mL | INTRAVENOUS | Status: AC

## 2023-08-20 MED ORDER — METHIMAZOLE 10 MG PO TABS
20.0000 mg | ORAL_TABLET | Freq: Every day | ORAL | Status: DC
  Administered 2023-08-20: 20 mg via ORAL
  Filled 2023-08-20: qty 2

## 2023-08-20 MED ORDER — INSULIN ASPART 100 UNIT/ML IJ SOLN
0.0000 [IU] | Freq: Three times a day (TID) | INTRAMUSCULAR | Status: DC
  Administered 2023-08-20 – 2023-08-22 (×7): 3 [IU] via SUBCUTANEOUS
  Administered 2023-08-23: 2 [IU] via SUBCUTANEOUS
  Administered 2023-08-23 (×2): 3 [IU] via SUBCUTANEOUS
  Administered 2023-08-24: 2 [IU] via SUBCUTANEOUS
  Filled 2023-08-20 (×11): qty 1

## 2023-08-20 MED ORDER — MUPIROCIN 2 % EX OINT
1.0000 | TOPICAL_OINTMENT | Freq: Two times a day (BID) | CUTANEOUS | Status: DC
  Administered 2023-08-20 – 2023-08-24 (×8): 1 via NASAL
  Filled 2023-08-20: qty 22

## 2023-08-20 MED ORDER — AMIODARONE HCL IN DEXTROSE 360-4.14 MG/200ML-% IV SOLN: 30.0000 mg/h | INTRAVENOUS | Status: DC

## 2023-08-20 NOTE — Progress Notes (Signed)
Peripherally Inserted Central Catheter Placement  The IV Nurse has discussed with the patient and/or persons authorized to consent for the patient, the purpose of this procedure and the potential benefits and risks involved with this procedure.  The benefits include less needle sticks, lab draws from the catheter, and the patient may be discharged home with the catheter. Risks include, but not limited to, infection, bleeding, blood clot (thrombus formation), and puncture of an artery; nerve damage and irregular heartbeat and possibility to perform a PICC exchange if needed/ordered by physician.  Alternatives to this procedure were also discussed.  Bard Power PICC patient education guide, fact sheet on infection prevention and patient information card has been provided to patient /or left at bedside.    PICC Placement Documentation  PICC Triple Lumen 08/20/23 Right Brachial 43 cm 0 cm (Active)  Indication for Insertion or Continuance of Line Vasoactive infusions 08/20/23 1840  Exposed Catheter (cm) 0 cm 08/20/23 1840  Site Assessment Clean, Dry, Intact 08/20/23 1840  Lumen #1 Status Flushed;Saline locked;Blood return noted 08/20/23 1840  Lumen #2 Status Flushed;Saline locked;Blood return noted 08/20/23 1840  Lumen #3 Status Flushed;Saline locked;Blood return noted 08/20/23 1840  Dressing Type Transparent;Securing device 08/20/23 1840  Dressing Status Antimicrobial disc in place;Clean, Dry, Intact 08/20/23 1840  Line Care Connections checked and tightened 08/20/23 1840  Line Adjustment (NICU/IV Team Only) No 08/20/23 1840  Dressing Intervention New dressing;Adhesive placed at insertion site (IV team only) 08/20/23 1840  Dressing Change Due 08/27/23 08/20/23 1840       Maximino Greenland 08/20/2023, 6:55 PM

## 2023-08-20 NOTE — Assessment & Plan Note (Signed)
No acute issues Patient currently on [facility]

## 2023-08-20 NOTE — Assessment & Plan Note (Signed)
Sliding scale insulin coverage 

## 2023-08-20 NOTE — Plan of Care (Signed)
Problem: Education: Goal: Ability to describe self-care measures that may prevent or decrease complications (Diabetes Survival Skills Education) will improve 08/20/2023 1928 by Modena Jansky, RN Outcome: Progressing 08/20/2023 1927 by Modena Jansky, RN Outcome: Progressing Goal: Individualized Educational Video(s) 08/20/2023 1928 by Modena Jansky, RN Outcome: Progressing 08/20/2023 1927 by Modena Jansky, RN Outcome: Progressing   Problem: Coping: Goal: Ability to adjust to condition or change in health will improve 08/20/2023 1928 by Modena Jansky, RN Outcome: Progressing 08/20/2023 1927 by Modena Jansky, RN Outcome: Progressing   Problem: Fluid Volume: Goal: Ability to maintain a balanced intake and output will improve 08/20/2023 1928 by Modena Jansky, RN Outcome: Progressing 08/20/2023 1927 by Modena Jansky, RN Outcome: Progressing   Problem: Health Behavior/Discharge Planning: Goal: Ability to identify and utilize available resources and services will improve 08/20/2023 1928 by Modena Jansky, RN Outcome: Progressing 08/20/2023 1927 by Modena Jansky, RN Outcome: Progressing Goal: Ability to manage health-related needs will improve 08/20/2023 1928 by Modena Jansky, RN Outcome: Progressing 08/20/2023 1927 by Modena Jansky, RN Outcome: Progressing   Problem: Metabolic: Goal: Ability to maintain appropriate glucose levels will improve 08/20/2023 1928 by Modena Jansky, RN Outcome: Progressing 08/20/2023 1927 by Modena Jansky, RN Outcome: Progressing   Problem: Nutritional: Goal: Maintenance of adequate nutrition will improve 08/20/2023 1928 by Modena Jansky, RN Outcome: Progressing 08/20/2023 1927 by Modena Jansky, RN Outcome: Progressing Goal: Progress toward achieving an optimal weight will improve 08/20/2023 1928 by Modena Jansky, RN Outcome: Progressing 08/20/2023 1927 by  Modena Jansky, RN Outcome: Progressing   Problem: Skin Integrity: Goal: Risk for impaired skin integrity will decrease 08/20/2023 1928 by Modena Jansky, RN Outcome: Progressing 08/20/2023 1927 by Modena Jansky, RN Outcome: Progressing   Problem: Tissue Perfusion: Goal: Adequacy of tissue perfusion will improve 08/20/2023 1928 by Modena Jansky, RN Outcome: Progressing 08/20/2023 1927 by Modena Jansky, RN Outcome: Progressing   Problem: Education: Goal: Knowledge of General Education information will improve Description: Including pain rating scale, medication(s)/side effects and non-pharmacologic comfort measures 08/20/2023 1928 by Modena Jansky, RN Outcome: Progressing 08/20/2023 1927 by Modena Jansky, RN Outcome: Progressing   Problem: Health Behavior/Discharge Planning: Goal: Ability to manage health-related needs will improve 08/20/2023 1928 by Modena Jansky, RN Outcome: Progressing 08/20/2023 1927 by Modena Jansky, RN Outcome: Progressing   Problem: Clinical Measurements: Goal: Ability to maintain clinical measurements within normal limits will improve 08/20/2023 1928 by Modena Jansky, RN Outcome: Progressing 08/20/2023 1927 by Modena Jansky, RN Outcome: Progressing Goal: Will remain free from infection 08/20/2023 1928 by Modena Jansky, RN Outcome: Progressing 08/20/2023 1927 by Modena Jansky, RN Outcome: Progressing Goal: Diagnostic test results will improve 08/20/2023 1928 by Modena Jansky, RN Outcome: Progressing 08/20/2023 1927 by Modena Jansky, RN Outcome: Progressing Goal: Respiratory complications will improve 08/20/2023 1928 by Modena Jansky, RN Outcome: Progressing 08/20/2023 1927 by Modena Jansky, RN Outcome: Progressing Goal: Cardiovascular complication will be avoided 08/20/2023 1928 by Modena Jansky, RN Outcome: Progressing 08/20/2023 1927 by Modena Jansky, RN Outcome: Progressing   Problem: Activity: Goal: Risk for activity intolerance will decrease 08/20/2023 1928 by Modena Jansky, RN Outcome: Progressing 08/20/2023 1927 by Modena Jansky, RN Outcome: Progressing   Problem: Nutrition: Goal: Adequate nutrition will be maintained 08/20/2023 1928 by Modena Jansky, RN Outcome: Progressing 08/20/2023 1927 by Modena Jansky, RN Outcome: Progressing  Problem: Coping: Goal: Level of anxiety will decrease 08/20/2023 1928 by Modena Jansky, RN Outcome: Progressing 08/20/2023 1927 by Modena Jansky, RN Outcome: Progressing   Problem: Elimination: Goal: Will not experience complications related to bowel motility 08/20/2023 1928 by Modena Jansky, RN Outcome: Progressing 08/20/2023 1927 by Modena Jansky, RN Outcome: Progressing Goal: Will not experience complications related to urinary retention 08/20/2023 1928 by Modena Jansky, RN Outcome: Progressing 08/20/2023 1927 by Modena Jansky, RN Outcome: Progressing   Problem: Pain Management: Goal: General experience of comfort will improve 08/20/2023 1928 by Modena Jansky, RN Outcome: Progressing 08/20/2023 1927 by Modena Jansky, RN Outcome: Progressing   Problem: Safety: Goal: Ability to remain free from injury will improve 08/20/2023 1928 by Modena Jansky, RN Outcome: Progressing 08/20/2023 1927 by Modena Jansky, RN Outcome: Progressing   Problem: Skin Integrity: Goal: Risk for impaired skin integrity will decrease 08/20/2023 1928 by Modena Jansky, RN Outcome: Progressing 08/20/2023 1927 by Modena Jansky, RN Outcome: Progressing   Problem: Education: Goal: Ability to demonstrate management of disease process will improve 08/20/2023 1928 by Modena Jansky, RN Outcome: Progressing 08/20/2023 1927 by Modena Jansky, RN Outcome: Progressing Goal: Ability to verbalize understanding  of medication therapies will improve 08/20/2023 1928 by Modena Jansky, RN Outcome: Progressing 08/20/2023 1927 by Modena Jansky, RN Outcome: Progressing Goal: Individualized Educational Video(s) 08/20/2023 1928 by Modena Jansky, RN Outcome: Progressing 08/20/2023 1927 by Modena Jansky, RN Outcome: Progressing   Problem: Activity: Goal: Capacity to carry out activities will improve 08/20/2023 1928 by Modena Jansky, RN Outcome: Progressing 08/20/2023 1927 by Modena Jansky, RN Outcome: Progressing   Problem: Cardiac: Goal: Ability to achieve and maintain adequate cardiopulmonary perfusion will improve 08/20/2023 1928 by Modena Jansky, RN Outcome: Progressing 08/20/2023 1927 by Modena Jansky, RN Outcome: Progressing

## 2023-08-20 NOTE — Assessment & Plan Note (Signed)
 Followup echocardiogram.

## 2023-08-20 NOTE — ED Notes (Addendum)
Report given to [facility], ICU RN.

## 2023-08-20 NOTE — Consult Note (Signed)
Pharmacy Consult Note - Anticoagulation  Pharmacy Consult for heparin Indication: atrial fibrillation  PATIENT MEASUREMENTS: Height: 5\' 11"  (180.3 cm) Weight: 113.4 kg (250 lb) IBW/kg (Calculated) : 75.3 HEPARIN DW (KG): 99.9  VITAL SIGNS: Temp: 98.2 F (36.8 C) (12/11 1433) Temp Source: Oral (12/11 1433) BP: 100/80 (12/11 1622) Pulse Rate: 107 (12/11 1620)  Recent Labs    08/19/23 2232 08/20/23 0050 08/20/23 0732  HGB 12.7*  --   --   HCT 38.3*  --   --   PLT 281  --   --   CREATININE 0.95  --  1.04  TROPONINIHS 56* 46*  --     Estimated Creatinine Clearance: 89.4 mL/min (by C-G formula based on SCr of 1.04 mg/dL).  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 male with PMH including Afib, HFrEF, aortic stenosis, LBBB is presenting with acute HFrEF. Patient is on chronic anticoagulation with apixaban per chart review. Patient may need a cardiac catheterization so pharmacy has been consulted to initiate and manage heparin intravenous infusion.  Pertinent medications: Apixaban 5 mg twice daily Last dose 12/11 @ 0926  Goal(s) of therapy: Heparin level 0.3 - 0.7 units/mL aPTT 66 - 102 seconds Monitor platelets by anticoagulation protocol: Yes   Baseline anticoagulation labs: Recent Labs    08/19/23 2232  HGB 12.7*  PLT 281    Date Time aPTT/HL Rate/Comment      PLAN: Start heparin infusion at 1500 units/hour beginning tonight @ 2200 (when next apixaban dose would be due) Check aPTT in 6 hours after heparin starts Continue to titrate by aPTT until heparin level and aPTT correlate and/or apixaban washes out, then titrate by  heparin level alone. Check heparin level with next AM labs. Continue to monitor CBC daily while on heparin infusion.  Will M. Dareen Piano, PharmD Clinical Pharmacist 08/20/2023 5:45 PM

## 2023-08-20 NOTE — ED Notes (Signed)
Dr. Mariah Milling made aware of patient's BP, will continue to monitor; no new orders at this time.

## 2023-08-20 NOTE — Progress Notes (Signed)
[facility] Progress Note  Patient: William [name].    JXB:[id number]  DOA: 08/20/2023     Date of Service: the patient was seen and examined on 08/20/2023  Chief Complaint  Patient presents with   Leg Swelling   Brief hospital course: Haoxuan Orso. is a 67 y.o. male with medical history significant for Dilated cardiomyopathy EF <25%, followed by the CHF clinic, A-fib s/p cardioversion, DM, mild aortic stenosis,known LBBB, history of gastric bypass, being admitted with a CHF exacerbation.  Patient presented with a 1 week history of worsening edema with started in his feet, no progressing up to the scrotum.  He also has orthopnea and dyspnea on exertion.   He states that he feels like he was doing well with his CHF until he developed a cold with cough, sore throat and sinus congestion about a week ago.  He saw his PCP who prescribed Amoxil, Atrovent nasal spray and Tessalon Perles which he has been using but without much help.  On awaking on the day of arrival he saw that the swelling had gone up to his scrotum.  He took 120 mg of his Lasix (usual dose 40 mg) and presented to the ED ED course and data review: BP 92/67 with otherwise normal vitals. Workup notable for the following: Troponin 46 and BNP 1995 EKG, personally viewed and interpreted showing NSR at 82 with known LBBB Chest x-ray showing suspected trace left pleural effusion   Patient treated with IV Lasix Hospitalist consulted for admission for CHF exacerbation not responding to outpatient management.   Assessment and Plan:  A-fib/atrial flutter with RVR, hypotensive H/o PAF s/p cardioversion (paroxysmal atrial fibrillation)  Continue apixaban 12/11 amiodarone IV bolus followed by amiodarone IV infusion started 12/11 persistent hypotension, started Levophed via peripheral line Transferred to stepdown unit Neurology consulted   Acute on chronic systolic CHF (congestive heart failure) (HCC) Dilated  cardiomyopathy Hypotension BP 92/67 on arrival, possibly related to increased home Lasix dosing S/p Midodrine for BP support in order to continue GDMT Continue Lasix infusion due to blood pressure Hedl Coreg, Entresto and spironolactone due to hypotension Daily weights with intake and output monitoring BNP 1995 elevated, Trop mildly elevated, demand ischemia D-dimer elevated, CTA negative for PE TTE LVEF 20 to 25%, global hypokinesis, moderately dilated.  LA moderately dilated.  Moderate AS.  Cardiology consult and heart failure team consulted    Hyperthyroidism, new diagnosis Patient presented with A-fib with RVR Free T4 level 4.65 elevated TSH 0.01 very low 12/11 started methimazole 20 mg p.o. daily, generally decreased dose for maintenance as per improvement of free T4 Check free T4 level daily   Upper respiratory tract infection Patient has been coughing for the past week with sinus drainage No improvement with Amoxil, Atrovent nasal spray and Tessalon Perles Tussionex, Flonase      History of gastric bypass No acute issues Patient currently on Mounjaro   Type 2 diabetes mellitus without complication  Sliding scale insulin coverage     Body mass index is 34.87 kg/m.  Interventions:  Diet: Heart healthy/carb modified diet DVT Prophylaxis: Therapeutic Anticoagulation with Eliquis    Advance goals of care discussion: Full code  Family Communication: family was not present at bedside, at the time of interview.  The pt provided permission to discuss medical plan with the family. Opportunity was given to ask question and all questions were answered satisfactorily.   Disposition:  Pt is from home, admitted with A-fib with RVR, CHF exacerbation,  found to have hyperthyroidism, stil critically ill, which precludes a safe discharge. Discharge to home, when stable, and cleared by cardiology.  May need few more days to improve.  Subjective: No significant events overnight,  patient denied any headache or dizziness, no chest pain or palpitations, complaining of having cough which is improving, still has significant lower extremity edema, no any other complaints.  Physical Exam: General: NAD, lying comfortably Appear in no distress, affect appropriate Eyes: PERRLA ENT: Oral Mucosa Clear, moist  Neck: no JVD,  Cardiovascular: Irregular rhythm, no Murmur,  Respiratory: good respiratory effort, Bilateral Air entry equal and Decreased, no Crackles, no wheezes Abdomen: Bowel Sound present, Soft and no tenderness,  Skin: no rashes Extremities: 4+ pedal edema, no calf tenderness Neurologic: without any new focal findings Gait not checked due to patient safety concerns  Vitals:   08/20/23 1500 08/20/23 1510 08/20/23 1515 08/20/23 1530  BP: (!) 84/59  (!) 83/64 (!) 78/61  Pulse: (!) 112  (!) 114 (!) 110  Resp: (!) 21  20 19   Temp:      TempSrc:      SpO2: 96% 95% 93% 94%  Weight:      Height:        Intake/Output Summary (Last 24 hours) at 08/20/2023 1533 Last data filed at 08/20/2023 1233 Gross per 24 hour  Intake --  Output 2495 ml  Net -2495 ml   Filed Weights   08/19/23 2229  Weight: 113.4 kg    Data Reviewed: I have personally reviewed and interpreted daily labs, tele strips, imagings as discussed above. I reviewed all nursing notes, pharmacy notes, vitals, pertinent old records I have discussed plan of care as described above with RN and patient/family.  CBC: Recent Labs  Lab 08/19/23 2232  WBC 10.3  HGB 12.7*  HCT 38.3*  MCV 89.9  PLT 281   Basic Metabolic Panel: Recent Labs  Lab 08/19/23 2232 08/20/23 0732  NA 134* 137  K 4.6 4.1  CL 104 103  CO2 20* 22  GLUCOSE 114* 127*  BUN 24* 23  CREATININE 0.95 1.04  CALCIUM 8.4* 8.0*  MG  --  1.9  PHOS  --  4.9*    Studies: ECHOCARDIOGRAM COMPLETE  Result Date: 08/20/2023    ECHOCARDIOGRAM REPORT   Patient Name:   William [name]. Date of Exam: 08/20/2023 Medical Rec #:   [id number]        Height:       71.0 in Accession #:    [id number]       Weight:       250.0 lb Date of Birth:  11-Mar-1956        BSA:          2.318 m Patient Age:    67 years         BP:           80/58 mmHg Patient Gender: M                HR:           112 bpm. Exam Location:  [facility] Procedure: 2D Echo, Cardiac Doppler, Color Doppler and Strain Analysis Indications:     CHF-acute systolic I50.21  History:         Patient has no prior history of Echocardiogram examinations,                  most recent 01/03/2023. CHF; Risk Factors:Diabetes.  Sonographer:  Cristela Blue Referring Phys:  [id number] Andris Baumann Diagnosing Phys: Julien Nordmann MD IMPRESSIONS  1. Left ventricular ejection fraction, by estimation, is 20 to 25%. The left ventricle has severely decreased function. The left ventricle demonstrates global hypokinesis. The left ventricular internal cavity size was moderately dilated. Left ventricular diastolic parameters are indeterminate.  2. Right ventricular systolic function is normal. The right ventricular size is normal.  3. Left atrial size was moderately dilated.  4. The mitral valve is normal in structure. Mild to moderate mitral valve regurgitation. No evidence of mitral stenosis.  5. The aortic valve is normal in structure. There is moderate calcification of the aortic valve. Aortic valve regurgitation is mild. Moderate aortic valve stenosis. Aortic valve area, by VTI measures 0.97 cm. Aortic valve mean gradient measures 16.0 mmHg. Aortic valve Vmax measures 2.81 m/s.  6. The inferior vena cava is normal in size with greater than 50% respiratory variability, suggesting right atrial pressure of 3 mmHg. FINDINGS  Left Ventricle: Left ventricular ejection fraction, by estimation, is 20 to 25%. The left ventricle has severely decreased function. The left ventricle demonstrates global hypokinesis. The left ventricular internal cavity size was moderately dilated. There is no left ventricular hypertrophy.  Left ventricular diastolic parameters are indeterminate. Right Ventricle: The right ventricular size is normal. No increase in right ventricular wall thickness. Right ventricular systolic function is normal. Left Atrium: Left atrial size was moderately dilated. Right Atrium: Right atrial size was normal in size. Pericardium: There is no evidence of pericardial effusion. Mitral Valve: The mitral valve is normal in structure. Mild to moderate mitral valve regurgitation. No evidence of mitral valve stenosis. Tricuspid Valve: The tricuspid valve is normal in structure. Tricuspid valve regurgitation is mild . No evidence of tricuspid stenosis. Aortic Valve: The aortic valve is normal in structure. There is moderate calcification of the aortic valve. Aortic valve regurgitation is mild. Moderate aortic stenosis is present. Aortic valve mean gradient measures 16.0 mmHg. Aortic valve peak gradient  measures 31.5 mmHg. Aortic valve area, by VTI measures 0.97 cm. Pulmonic Valve: The pulmonic valve was normal in structure. Pulmonic valve regurgitation is not visualized. No evidence of pulmonic stenosis. Aorta: The aortic root is normal in size and structure. Venous: The inferior vena cava is normal in size with greater than 50% respiratory variability, suggesting right atrial pressure of 3 mmHg. IAS/Shunts: No atrial level shunt detected by color flow Doppler.  LEFT VENTRICLE PLAX 2D LVIDd:         5.50 cm LVIDs:         5.30 cm      2D Longitudinal Strain LV PW:         1.10 cm      2D Strain GLS Avg:     -3.7 % LV IVS:        1.30 cm LVOT diam:     2.00 cm LV SV:         43 LV SV Index:   18 LVOT Area:     3.14 cm  LV Volumes (MOD) LV vol d, MOD A4C: 162.0 ml LV vol s, MOD A4C: 151.0 ml LV SV MOD A4C:     162.0 ml LEFT ATRIUM             Index        RIGHT ATRIUM           Index LA diam:        5.00 cm 2.16 cm/m   RA  Area:     13.50 cm LA Vol (A2C):   78.3 ml 33.77 ml/m  RA Volume:   39.40 ml  17.00 ml/m LA Vol (A4C):    45.6 ml 19.67 ml/m LA Biplane Vol: 65.5 ml 28.25 ml/m  AORTIC VALVE AV Area (Vmax):    0.82 cm AV Area (Vmean):   0.88 cm AV Area (VTI):     0.97 cm AV Vmax:           280.67 cm/s AV Vmean:          177.000 cm/s AV VTI:            0.442 m AV Peak Grad:      31.5 mmHg AV Mean Grad:      16.0 mmHg LVOT Vmax:         73.40 cm/s LVOT Vmean:        49.300 cm/s LVOT VTI:          0.136 m LVOT/AV VTI ratio: 0.31  AORTA Ao Root diam: 3.10 cm MITRAL VALVE                TRICUSPID VALVE MV Area (PHT): 5.75 cm     TR Peak grad:   27.7 mmHg MV Decel Time: 132 msec     TR Vmax:        263.00 cm/s MV E velocity: 122.00 cm/s                             SHUNTS                             Systemic VTI:  0.14 m                             Systemic Diam: 2.00 cm Julien Nordmann MD Electronically signed by Julien Nordmann MD Signature Date/Time: 08/20/2023/3:13:56 PM    Final    CT Angio Chest PE W and/or Wo Contrast  Result Date: 08/20/2023 CLINICAL DATA:  Positive D-dimer shortness of breath, dyspnea on exertion, orthopnea, and increasing lower extremity swelling now extending into the scrotum. EXAM: CT ANGIOGRAPHY CHEST WITH CONTRAST TECHNIQUE: Multidetector CT imaging of the chest was performed using the standard protocol during bolus administration of intravenous contrast. Multiplanar CT image reconstructions and MIPs were obtained to evaluate the vascular anatomy. RADIATION DOSE REDUCTION: This exam was performed according to the departmental dose-optimization program which includes automated exposure control, adjustment of the mA and/or kV according to patient size and/or use of iterative reconstruction technique. CONTRAST:  80mL OMNIPAQUE IOHEXOL 350 MG/ML SOLN COMPARISON:  Portable chest today, PA Lat chest 11/25/2019, and CTA chest 11/24/2019. FINDINGS: Cardiovascular: The pulmonary trunk was 3.3 cm indicating arterial hypertension, previously 3 cm. No arterial embolic filling defects are seen. The superior pulmonary  veins are mildly prominent. There is mild cardiomegaly with a left chamber predominance, moderate patchy three-vessel coronary calcifications are noted. No pericardial effusion. There is a 4 vessel aortic arch with normal variant arch origin of the left vertebral artery. The great vessels are clear. There is relatively mild aortic atherosclerosis, but there are also moderate to heavy calcifications extending across the aortic valve leaflets. There is no aortic aneurysm, stenosis or dissection. There is a small amount of air in the right internal mammary vein and in the brachiocephalic venous arch, probably either injected  at the time of IV placement or with the iodinated contrast. Mediastinum/Nodes: No enlarged mediastinal, hilar, or axillary lymph nodes. The partially visible thyroid gland, thoracic trachea, and esophagus demonstrate no significant findings. Lungs/Pleura: There is diffuse bronchial thickening, greater in the lower lobes. There is mild interlobular septal thickening in the lower lobes consistent with interstitial edema. Small to moderate symmetric layering pleural effusions are similar to 2021. There is posterior atelectasis primarily in the lower lobes but no focal consolidation is seen. Upper Abdomen: 4.6 cm rim calcified cyst again noted in the upper pole right kidney, internal Hounsfield density of 8. This is unchanged in size, with ultrasound dated 11/24/2019 noting no wall complexity. No follow-up imaging is recommended. There are gastric bypass changes again noted. Mild hepatic steatosis, and advanced fatty atrophy in the pancreas. No acute upper abdominal abnormality is seen. Musculoskeletal: No chest wall abnormality. No acute or significant osseous findings. Review of the MIP images confirms the above findings. IMPRESSION: 1. No evidence of arterial embolus. 2. Mild cardiomegaly with left chamber predominance, and moderate coronary calcifications. 3. Enlarged pulmonary trunk 3.3 cm  indicating arterial hypertension. 4. Mild interstitial edema in the lower lobes, with small to moderate symmetric layering pleural effusions similar to 2021. 5. Diffuse bronchial thickening, greater in the lower lobes. Posterior atelectasis. 6. Aortic and coronary artery atherosclerosis. 7. Moderate to heavy calcifications extending across the aortic valve leaflets. Echocardiography may be helpful. 8. Hepatic steatosis. 9. 4.6 cm right renal cyst, with no wall complexity on prior ultrasound. No follow-up imaging recommended. Unchanged. Aortic Atherosclerosis (ICD10-I70.0). Electronically Signed   By: Almira Bar M.D.   On: 08/20/2023 07:35   DG Chest 1 View  Result Date: 08/19/2023 CLINICAL DATA:  Chest pain, cough EXAM: CHEST  1 VIEW COMPARISON:  11/25/2019 FINDINGS: Lungs are clear. Suspected trace left pleural effusion. No pneumothorax. The heart is top-normal in size. IMPRESSION: Suspected trace left pleural effusion. Electronically Signed   By: Charline Bills M.D.   On: 08/19/2023 22:54    Scheduled Meds:  apixaban  5 mg Oral BID   chlorpheniramine-HYDROcodone  5 mL Oral Q12H   fluticasone  1 spray Each Nare Daily   insulin aspart  0-15 Units Subcutaneous TID WC   insulin aspart  0-5 Units Subcutaneous QHS   methIMAzole  20 mg Oral Daily   pravastatin  40 mg Oral QHS   Continuous Infusions:  sodium chloride     amiodarone     furosemide (LASIX) 200 mg in dextrose 5 % 100 mL (2 mg/mL) infusion 10 mg/hr (08/20/23 0352)   norepinephrine (LEVOPHED) Adult infusion     PRN Meds: acetaminophen **OR** acetaminophen, ondansetron **OR** ondansetron (ZOFRAN) IV  Time spent: 55 minutes  Author: Gillis Santa. MD [facility] 08/20/2023 3:33 PM  To reach On-call, see care teams to locate the attending and reach out to them via [email or web address]. If 7PM-7AM, please contact night-coverage If you still have difficulty reaching the attending provider, please page the [facility] (Director on Call)  for [facility] on amion for assistance.

## 2023-08-20 NOTE — Assessment & Plan Note (Signed)
Currently in sinus rhythm Continue apixaban

## 2023-08-20 NOTE — Assessment & Plan Note (Signed)
Patient has been coughing for the past week with sinus drainage No improvement with Amoxil, Atrovent nasal spray and Tessalon Perles Tussionex, Flonase Will add on a sed rate

## 2023-08-20 NOTE — ED Notes (Signed)
Patient being transported to ICU at this time. Melissa, RN called report to ICU.

## 2023-08-20 NOTE — Consult Note (Signed)
Cardiology Consultation   Patient ID: William [name]. MRN: [id number]; DOB: 29-Jan-1956  Admit date: 08/20/2023 Date of Consult: 08/20/2023  PCP:  Lynnea Ferrier, MD   [facility] Providers Cardiologist:  Julien Nordmann, MD        Patient Profile:   William Seachrist. is a 67 y.o. male with a hx of dilated cardiomyopathy EF <25%, paraoxysmal afib s/p cardioversion, DM, mild aortic stenosis, LBBB, and hx gastric bypass who is being seen 08/20/2023 for the evaluation of CHF exacerbation at the request of Dr. Para March.  History of Present Illness:   William [name] follows with Dr. Ellie Spickler Milling for LBBB and CHF, formerly a patient of Dr. Gwen Pounds. He also follows with CHF clinic, previously followed at [facility]. His last visit with Dr. Jayliana Valencia Milling was 07/15/22 for a 6 month follow up at which he was doing well. He was taking medications as prescribed without side effects and EF had mildly improved to 35-40%. Maintaining normal sinus rhythm with last episode of afib in 2021.   He reports that he was feeling sick with congestion, sore throat, rhinitis, body aches, chills, and cough earlier in the week. He also noted worsening shortness of breath. Also reports trouble sleeping since he started feeling ill. He denies chest pain, palpitations, nausea, lightheadedness, and dizziness. On Tuesday he started to notice LE swelling and was to see his PCP Dr. Clide Cliff today, but he felt the swelling was worsening and decided to come to the ED.   In the ED, BP 92/67, HR 78, SpO2 92%. Labs notable for troponin  56>>46 and BNP 1995. EKG showed normal sinus rhythm rate 82 with known LBBB. COVID, flu, and RSV negative. CXR showed trace left pleural effusion and no edema. He was given IV lasix 40 mg.    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   CHF (congestive heart failure) (HCC)    Diabetes mellitus without complication (HCC)    GERD (gastroesophageal reflux  disease)    Hypertension    Iron deficiency anemia    Obesity    Osteoarthritis of hip    right   Peptic ulcer disease     Past Surgical History:  Procedure Laterality Date   CARDIOVERSION N/A 11/26/2019   Procedure: CARDIOVERSION;  Surgeon: Dalia Heading, MD;  Location: [facility] ORS;  Service: Cardiovascular;  Laterality: N/A;   COLONOSCOPY     ESOPHAGOGASTRODUODENOSCOPY     GASTRIC BYPASS     2009   NASAL SINUS SURGERY     TOTAL HIP ARTHROPLASTY Right 08/20/2016   Procedure: TOTAL HIP ARTHROPLASTY ANTERIOR APPROACH;  Surgeon: Kennedy Bucker, MD;  Location: [facility] ORS;  Service: Orthopedics;  Laterality: Right;     Home Medications:  Prior to Admission medications   Medication Sig Start Date End Date Taking? Authorizing Provider  amiodarone (PACERONE) 200 MG tablet Take 200 mg by mouth daily.   Yes [provider]  amoxicillin (AMOXIL) 500 MG capsule Take 2,000 mg by mouth once as needed (Pretreatment for Procedure(s)). 06/12/23  Yes [provider]  amoxicillin (AMOXIL) 875 MG tablet Take 875 mg by mouth 2 (two) times daily. 08/14/23 08/21/23 Yes [provider]  betamethasone valerate (VALISONE) 0.1 % cream Apply 1 Application topically 2 (two) times daily as needed. 04/11/23  Yes [provider]  carvedilol (COREG) 6.25 MG tablet TAKE 1 TABLET BY MOUTH TWICE  DAILY 02/11/23  Yes Hackney, Tina A, FNP  Cyanocobalamin (B-12) 2500 MCG TABS Take 5,000 mcg by mouth daily.    Yes [provider]  ELIQUIS 5 MG TABS tablet TAKE 1 TABLET BY MOUTH TWICE  DAILY 02/11/23  Yes Clarisa Kindred A, FNP  FLUoxetine (PROZAC) 40 MG capsule Take 1 capsule by mouth daily. 05/23/23  Yes [provider]  furosemide (LASIX) 40 MG tablet Take 1 tablet by mouth daily. 08/12/23  Yes [provider]  ipratropium (ATROVENT) 0.03 % nasal spray Place 2 sprays into the nose 2 (two) times daily. 08/14/23 08/28/23 Yes [provider]  JARDIANCE 25 MG TABS  tablet Take 25 mg by mouth daily. 11/02/19  Yes [provider]  metFORMIN (GLUCOPHAGE) 1000 MG tablet Take 1,000 mg by mouth 2 (two) times daily with a meal.    Yes [provider]  Multiple Vitamin (MULTIVITAMIN WITH MINERALS) TABS tablet Take 1 tablet by mouth daily.   Yes [provider]  pantoprazole (PROTONIX) 40 MG tablet Take 40 mg by mouth every evening.    Yes [provider]  pravastatin (PRAVACHOL) 40 MG tablet Take 40 mg by mouth at bedtime.  09/22/19  Yes [provider]  sacubitril-valsartan (ENTRESTO) 24-26 MG Take 1 tablet by mouth 2 (two) times daily.   Yes [provider]  spironolactone (ALDACTONE) 25 MG tablet TAKE 1 TABLET BY MOUTH DAILY 08/12/23  Yes Hackney, Tina A, FNP  tirzepatide [facility]) 12.5 MG/0.5ML Pen Inject 12.5 mg into the skin once a week. 05/16/23  Yes [provider]  triamcinolone cream (KENALOG) 0.1 % Apply 1 Application topically 2 (two) times daily as needed. 04/07/23  Yes [provider]  benzonatate (TESSALON) 200 MG capsule Take 200 mg by mouth 3 (three) times daily as needed for cough. Patient not taking: Reported on 08/20/2023 08/14/23 08/21/23  [provider]  sildenafil (VIAGRA) 100 MG tablet Take 100 mg by mouth as needed for erectile dysfunction. Patient not taking: Reported on 08/20/2023 10/06/20   [provider]    Inpatient Medications: Scheduled Meds:  apixaban  5 mg Oral BID   chlorpheniramine-HYDROcodone  5 mL Oral Q12H   fluticasone  1 spray Each Nare Daily   insulin aspart  0-15 Units Subcutaneous TID WC   insulin aspart  0-5 Units Subcutaneous QHS   pravastatin  40 mg Oral QHS   Continuous Infusions:  amiodarone 60 mg/hr (08/20/23 1308)   Followed by   amiodarone     furosemide (LASIX) 200 mg in dextrose 5 % 100 mL (2 mg/mL) infusion 10 mg/hr (08/20/23 0352)   PRN Meds: acetaminophen **OR** acetaminophen, ondansetron **OR** ondansetron  (ZOFRAN) IV  Allergies:    Allergies  Allergen Reactions   Morphine And Codeine Hives   Nsaids Other (See Comments)    Gastric bypass   Opium Hives    Opiate drip when had gastric bypass drug unknown (morphine drip)    Social History:   Social History   Socioeconomic History   Marital status: Single    Spouse name: Not on file   Number of children: Not on file   Years of education: Not on file   Highest education level: Not on file  Occupational History   Not on file  Tobacco Use   Smoking status: Never   Smokeless tobacco: Never  Vaping Use   Vaping status: Never Used  Substance and Sexual Activity   Alcohol use: No   Drug use: No   Sexual activity:  Not Currently  Other Topics Concern   Not on file  Social History Narrative   Not on file   Social Determinants of Health   Financial Resource Strain: Low Risk  (05/16/2023)   Received from [facility]   Overall Financial Resource Strain (CARDIA)    Difficulty of Paying Living Expenses: Not hard at all  Food Insecurity: No Food Insecurity (05/16/2023)   Received from [facility]   Hunger Vital Sign    Worried About Running Out of Food in the Last Year: Never true    Ran Out of Food in the Last Year: Never true  Transportation Needs: No Transportation Needs (05/16/2023)   Received from [facility] - Transportation    In the past 12 months, has lack of transportation kept you from medical appointments or from getting medications?: No    Lack of Transportation (Non-Medical): No  Physical Activity: Not on file  Stress: Stress Concern Present (12/03/2019)   Received from [facility], [facility]   [employer] of Occupational Health - Occupational Stress Questionnaire    Feeling of Stress : To some extent  Social Connections: Unknown (12/03/2019)   Received from [facility], [facility]    Social Connection and Isolation Panel [NHANES]    Frequency of Communication with Friends and Family: More than three times a week    Frequency of Social Gatherings with Friends and Family: Not on file    Attends Religious Services: Not on file    Active Member of Clubs or Organizations: Not on file    Attends Banker Meetings: Not on file    Marital Status: Not on file  Intimate Partner Violence: Not on file    Family History:   Family History  Problem Relation Age of Onset   Dementia Mother    Diabetes Father      ROS:  Please see the history of present illness.      Physical Exam/Data:   Vitals:   08/20/23 1145 08/20/23 1200 08/20/23 1230 08/20/23 1305  BP: 93/68 (!) 78/68 96/63 (!) 77/54  Pulse: (!) 115  (!) 110 (!) 110  Resp: 17 16 19 14   Temp:      TempSrc:      SpO2: 92%  97% 96%  Weight:      Height:        Intake/Output Summary (Last 24 hours) at 08/20/2023 1324 Last data filed at 08/20/2023 1233 Gross per 24 hour  Intake --  Output 2495 ml  Net -2495 ml      08/19/2023   10:29 PM 01/09/2023    8:25 AM 12/06/2022    8:41 AM  Last 3 Weights  Weight (lbs) 250 lb 265 lb 265 lb 3.2 oz  Weight (kg) 113.399 kg 120.203 kg 120.294 kg     Body mass index is 34.87 kg/m.  General:  Well nourished, well developed, in no acute distress HEENT: normal Vascular: No carotid bruits; Distal pulses 2+ bilaterally Cardiac: IRIR; normal S1, S2; no murmur  Lungs:  lung sounds diminished at bases bilaterally, worse on left  Abd: soft, nontender, no hepatomegaly  Ext: 2+ LE edema Skin: warm and dry  Psych:  Normal affect   EKG:  The EKG was personally reviewed and demonstrates:  atrial fibrillation rate 120 Telemetry:  Telemetry was personally reviewed and demonstrates:  Normal sinus rhythm with rate 80s until ~  0244 > atrial fibrillation rate 110s  Relevant CV Studies:  Repeat echo completed, results pending  01/01/23 Echocardiogram IMPRESSIONS   1.  Left ventricular ejection fraction, by estimation, is 40 to 45%. The  left ventricle has mildly decreased function. The left ventricle  demonstrates mild global hypokinesis with severe hypokinesis of the  anterior wall. Left ventricular diastolic  parameters are consistent with Grade I diastolic dysfunction (impaired  relaxation).   2. Right ventricular systolic function is normal. The right ventricular  size is normal. Tricuspid regurgitation signal is inadequate for assessing  PA pressure.   3. Left atrial size was mildly dilated.   4. The mitral valve is normal in structure. Mild mitral valve  regurgitation. No evidence of mitral stenosis.   5. The aortic valve is normal in structure. There is severe calcifcation  of the aortic valve. Aortic valve regurgitation is mild. Moderate aortic  valve stenosis. Degree of stenosis may be underestimated in setting of  depressed EF. Aortic valve area, by  VTI measures 0.95 cm. Aortic valve mean gradient measures 22.5 mmHg.  Aortic valve Vmax measures 3.26 m/s.   6. The inferior vena cava is normal in size with greater than 50%  respiratory variability, suggesting right atrial pressure of 3 mmHg.   7. Challenging images, definity not used   Laboratory Data:  High Sensitivity Troponin:   Recent Labs  Lab 08/19/23 2232 08/20/23 0050  TROPONINIHS 56* 46*     Chemistry Recent Labs  Lab 08/19/23 2232 08/20/23 0732  NA 134* 137  K 4.6 4.1  CL 104 103  CO2 20* 22  GLUCOSE 114* 127*  BUN 24* 23  CREATININE 0.95 1.04  CALCIUM 8.4* 8.0*  MG  --  1.9  GFRNONAA >60 >60  ANIONGAP 10 12    No results for input(s): "PROT", "ALBUMIN", "AST", "ALT", "ALKPHOS", "BILITOT" in the last 168 hours. Lipids No results for input(s): "CHOL", "TRIG", "HDL", "LABVLDL", "LDLCALC", "CHOLHDL" in the last 168 hours.  Hematology Recent Labs  Lab 08/19/23 2232  WBC 10.3  RBC 4.26  HGB 12.7*  HCT 38.3*  MCV 89.9  MCH 29.8  MCHC 33.2  RDW 13.1  PLT  281   Thyroid  Recent Labs  Lab 08/20/23 0732  TSH <0.010*  FREET4 4.65*    BNP Recent Labs  Lab 08/19/23 2232  BNP 1,995.5*    DDimer  Recent Labs  Lab 08/20/23 0520  DDIMER 0.65*     Radiology/Studies:  CT Angio Chest PE W and/or Wo Contrast  Result Date: 08/20/2023 CLINICAL DATA:  Positive D-dimer shortness of breath, dyspnea on exertion, orthopnea, and increasing lower extremity swelling now extending into the scrotum. EXAM: CT ANGIOGRAPHY CHEST WITH CONTRAST TECHNIQUE: Multidetector CT imaging of the chest was performed using the standard protocol during bolus administration of intravenous contrast. Multiplanar CT image reconstructions and MIPs were obtained to evaluate the vascular anatomy. RADIATION DOSE REDUCTION: This exam was performed according to the departmental dose-optimization program which includes automated exposure control, adjustment of the mA and/or kV according to patient size and/or use of iterative reconstruction technique. CONTRAST:  80mL OMNIPAQUE IOHEXOL 350 MG/ML SOLN COMPARISON:  Portable chest today, PA Lat chest 11/25/2019, and CTA chest 11/24/2019. FINDINGS: Cardiovascular: The pulmonary trunk was 3.3 cm indicating arterial hypertension, previously 3 cm. No arterial embolic filling defects are seen. The superior pulmonary veins are mildly prominent. There is mild cardiomegaly with a left chamber predominance, moderate patchy three-vessel coronary calcifications are noted. No pericardial effusion. There  is a 4 vessel aortic arch with normal variant arch origin of the left vertebral artery. The great vessels are clear. There is relatively mild aortic atherosclerosis, but there are also moderate to heavy calcifications extending across the aortic valve leaflets. There is no aortic aneurysm, stenosis or dissection. There is a small amount of air in the right internal mammary vein and in the brachiocephalic venous arch, probably either injected at the time of IV  placement or with the iodinated contrast. Mediastinum/Nodes: No enlarged mediastinal, hilar, or axillary lymph nodes. The partially visible thyroid gland, thoracic trachea, and esophagus demonstrate no significant findings. Lungs/Pleura: There is diffuse bronchial thickening, greater in the lower lobes. There is mild interlobular septal thickening in the lower lobes consistent with interstitial edema. Small to moderate symmetric layering pleural effusions are similar to 2021. There is posterior atelectasis primarily in the lower lobes but no focal consolidation is seen. Upper Abdomen: 4.6 cm rim calcified cyst again noted in the upper pole right kidney, internal Hounsfield density of 8. This is unchanged in size, with ultrasound dated 11/24/2019 noting no wall complexity. No follow-up imaging is recommended. There are gastric bypass changes again noted. Mild hepatic steatosis, and advanced fatty atrophy in the pancreas. No acute upper abdominal abnormality is seen. Musculoskeletal: No chest wall abnormality. No acute or significant osseous findings. Review of the MIP images confirms the above findings. IMPRESSION: 1. No evidence of arterial embolus. 2. Mild cardiomegaly with left chamber predominance, and moderate coronary calcifications. 3. Enlarged pulmonary trunk 3.3 cm indicating arterial hypertension. 4. Mild interstitial edema in the lower lobes, with small to moderate symmetric layering pleural effusions similar to 2021. 5. Diffuse bronchial thickening, greater in the lower lobes. Posterior atelectasis. 6. Aortic and coronary artery atherosclerosis. 7. Moderate to heavy calcifications extending across the aortic valve leaflets. Echocardiography may be helpful. 8. Hepatic steatosis. 9. 4.6 cm right renal cyst, with no wall complexity on prior ultrasound. No follow-up imaging recommended. Unchanged. Aortic Atherosclerosis (ICD10-I70.0). Electronically Signed   By: Almira Bar M.D.   On: 08/20/2023 07:35    DG Chest 1 View  Result Date: 08/19/2023 CLINICAL DATA:  Chest pain, cough EXAM: CHEST  1 VIEW COMPARISON:  11/25/2019 FINDINGS: Lungs are clear. Suspected trace left pleural effusion. No pneumothorax. The heart is top-normal in size. IMPRESSION: Suspected trace left pleural effusion. Electronically Signed   By: Charline Bills M.D.   On: 08/19/2023 22:54     Assessment and Plan:   Acute on chronic systolic CHF Dilated cardiomyopathy Hypotension - Presented with volume overload, shortness of breath, and trouble sleeping in the setting of an upper respiratory tract infection - BNP 1995 - Bps soft 80s/60s on average , HR 110s - Hold carvedilol, Entresto, and spironolactone due to low pressures - Cr 1.04 - Net output since admission 2.5 L - Continue IV lasix 2 mg/mL infusion - Continue to monitor kidney function, strict I/Os, and daily weights  Paroxysmal atrial fibrillation - History of PAF s/p ablation, most recent known episode in 2021 - Was in NSR upon arrival and flipped into atrial fibrillation ~0244 overnight with rates 110s - Suspect contributing to CHF exacerbation in addition to URI - Denies palpitations, lightheadedness, and dizziness - Limited on medication options due to low pressures - Given amiodarone 150 mg load over one hour due to hypotension and 60 mg/hr (1 mg/min) x 6 hours, then 30 mg/hr (0.5 mg/min)  - Watch patient closely and monitor for symptomatic hypotension (asymptomatic at this time)  Hyperthyroidism - TSH 0.01, T4 4.65 - Started on methimazole per IM  Upper respiratory tract infection - Suspect contributing some to CHF exacerbation - Management per IM  Elevated d dimer - D dimer 0.65 - CTA negative for PE  T2DM - Management per IM  For questions or updates, please contact [facility] Please consult [email or web address] for contact info under    Signed, Orion Crook, PA-C  08/20/2023 1:24 PM

## 2023-08-20 NOTE — Consult Note (Signed)
NAME:  William [name]., MRN:  [id number], DOB:  June 07, 1956, LOS: 0 ADMISSION DATE:  08/20/2023, CONSULTATION DATE: 08/20/2023 REFERRING MD: Dr. Mariah Milling, CHIEF COMPLAINT: Shortness of Breath   History of Present Illness:  This is a 67 yo male with a PMH of dilated cardiomyopathy EF <25% (followed by the CHF clinic), A-fibb s/p cardioversion, Type II diabetes mellitus, mild aortic stenosis, LBBB, and gastric bypass.  He presented to [facility] ER on 12/10 with dry cough, shortness of breath, sore throat, chest congestion, and bilateral lower extremity swelling over the past week.  He reported he has been compliant with taking his furosemide 40 mg daily.  However, on 12/10 he took a total of 120 mg of furosemide due to symptoms which did not increase his urinary output.  Symptoms persisted prompting ER visit.  ED Course Upon arrival to the ER significant lab results were: Na+ 134/CO2 20/BUN 24/BNP 1,995.5/troponin 56.  CXR concerning for trace left pleural effusion.  COVID/Influenza A&B/RSV negative.  Initial EKG revealed NSR, hr 82, LBBB.  CTA Chest negative for PE.   40 mg iv lasix administered x1 dose.  He was subsequently admitted to the stepdown unit per hospitalist team for additional workup and treatment.  However, he remained in the ER pending bed availability.  See detailed hospital course below under significant events.  CTA Chest PE:  No evidence of arterial embolus. Mild cardiomegaly with left chamber predominance, and moderate coronary calcifications. Enlarged pulmonary trunk 3.3 cm indicating arterial hypertension. Mild interstitial edema in the lower lobes, with small to moderate symmetric layering pleural effusions similar to 2021. Diffuse bronchial thickening, greater in the lower lobes. Posterior atelectasis. Aortic and coronary artery atherosclerosis. Moderate to heavy calcifications extending across the aortic valve leaflets. Echocardiography may be helpful. Hepatic steatosis. 4.6 cm right  renal cyst, with no wall complexity on prior ultrasound. No follow-up imaging recommended. Unchanged. Aortic Atherosclerosis (ICD10-I70.0).   Pertinent  Medical History  Anxiety  Type II Diabetes Mellitus GERD  HTN  Iron Deficiency Anemia  Obesity  Osteoarthritis  Peptic Ulcer Disease  Chronic Systolic CHF   Significant Hospital Events: Including procedures, antibiotic start and stop dates in addition to other pertinent events   12/11: Pt admitted with acute on chronic diastolic and systolic CHF exacerbation, URI, paroxysmal atrial fibrillation, and hyperthyroidism requiring lasix gtt and scheduled methimazole.  He later developed hypotension along with atrial fibrillation with rvr requiring levophed gtt, 0.25 mg iv digoxin x1 dose, and 150 mg amiodarone bolus per cardiology.  PCCM team consulted to assist with management   Interim History / Subjective:  Pt c/o frequent non productive cough.  Currently requiring levophed gtt @7  mcg/min to maintain map 65 or higher    Objective   Blood pressure 93/72, pulse (!) 104, temperature 98.2 F (36.8 C), temperature source Oral, resp. rate 16, height 5\' 11"  (1.803 m), weight 113.4 kg, SpO2 96%.        Intake/Output Summary (Last 24 hours) at 08/20/2023 1606 Last data filed at 08/20/2023 1233 Gross per 24 hour  Intake --  Output 2495 ml  Net -2495 ml   Filed Weights   08/19/23 2229  Weight: 113.4 kg    Examination: General: Acutely-ill appearing male,  HENT: Supple, no JVD present  Lungs: Faint rhonchi throughout, even, non laobred Cardiovascular: Irregular irregular, no r/g, 2+ radial/1+ distal pulses, 2+ bilateral lower extremity edema  Abdomen: +BS x4, soft, non tender, non distended  Extremities: Moves all extremities  Neuro: Alert and  oriented, following commands, PERRLA  GU: Voiding   Resolved Hospital Problem list     Assessment & Plan:   #Acute on chronic systolic and diastolic CHF  #Paroxysmal atrial fibrillation  with rvr  #Mildly elevated troponin likely secondary to demand ischemia  #Cardiogenic shock  Hx: Dilated cardiomyopathy and HTN Echo 08/20/23: EF 20 to 25%; left ventricle with global hypokinesis; mild to moderate mitral valve regurgitation - Continuous telemetry monitoring - Continue lasix gtt  - Prn levophed gtt to maintain map 65 or higher and sbp greater than 90 - Continue pravastatin and apixaban - Cardiology consulted appreciate input - Heart Failure Team will be consulted in the am   - Urine drug screen pending  - Trend cooxy   #Acute respiratory failure - Supplemental O2 for dyspnea and/or hypoxia  - Maintain O2 sats 92% or higher - Intermittent CXR and prn ABG  #URI  - Trend WBC and monitor fever curve  - Follow PCT  - Will check respiratory viral panel   #Hyperthyroidism  TSH <0.010/free T4 4.65 on admission  - Continue schedule methimazole  - Follow thyroid panel   #Type II diabetes mellitus  Hemoglobin A1c 08/20/23: 6.1 - CBG's ac/hs  - SSI  - Follow hypo/hyperglycemic protocol  - Target range 140 to 180  Best Practice (right click and "Reselect all SmartList Selections" daily)   Diet/type: Regular consistency (see orders) DVT prophylaxis Apixaban  Pressure ulcer(s): N/A GI prophylaxis: N/A Lines: Pending PICC line placement  Foley:  N/A Code Status:  full code Last date of multidisciplinary goals of care discussion [N/A]  Labs   CBC: Recent Labs  Lab 08/19/23 2232  WBC 10.3  HGB 12.7*  HCT 38.3*  MCV 89.9  PLT 281    Basic Metabolic Panel: Recent Labs  Lab 08/19/23 2232 08/20/23 0732  NA 134* 137  K 4.6 4.1  CL 104 103  CO2 20* 22  GLUCOSE 114* 127*  BUN 24* 23  CREATININE 0.95 1.04  CALCIUM 8.4* 8.0*  MG  --  1.9  PHOS  --  4.9*   GFR: Estimated Creatinine Clearance: 89.4 mL/min (by C-G formula based on SCr of 1.04 mg/dL). Recent Labs  Lab 08/19/23 2232  WBC 10.3    Liver Function Tests: No results for input(s): "AST",  "ALT", "ALKPHOS", "BILITOT", "PROT", "ALBUMIN" in the last 168 hours. No results for input(s): "LIPASE", "AMYLASE" in the last 168 hours. No results for input(s): "AMMONIA" in the last 168 hours.  ABG No results found for: "PHART", "PCO2ART", "PO2ART", "HCO3", "TCO2", "ACIDBASEDEF", "O2SAT"   Coagulation Profile: No results for input(s): "INR", "PROTIME" in the last 168 hours.  Cardiac Enzymes: No results for input(s): "CKTOTAL", "CKMB", "CKMBINDEX", "TROPONINI" in the last 168 hours.  HbA1C: Hgb A1c MFr Bld  Date/Time Value Ref Range Status  08/20/2023 07:32 AM 6.1 (H) 4.8 - 5.6 % Final    Comment:    (NOTE) Pre diabetes:          5.7%-6.4%  Diabetes:              >6.4%  Glycemic control for   <7.0% adults with diabetes     CBG: Recent Labs  Lab 08/20/23 0740 08/20/23 1137  GLUCAP 95 154*    Review of Systems: Positives in BOLD   Gen: Denies fever, chills, weight change, fatigue, night sweats HEENT: Denies blurred vision, double vision, hearing loss, tinnitus, sinus congestion, rhinorrhea, sore throat, neck stiffness, dysphagia PULM: intermittent shortness of breath, non productive  cough, sputum production, hemoptysis, wheezing CV: Denies chest pain, edema, orthopnea, paroxysmal nocturnal dyspnea, palpitations GI: Denies abdominal pain, nausea, vomiting, diarrhea, hematochezia, melena, constipation, change in bowel habits GU: Denies dysuria, hematuria, polyuria, oliguria, urethral discharge Endocrine: Denies hot or cold intolerance, polyuria, polyphagia or appetite change Derm: Denies rash, dry skin, scaling or peeling skin change Heme: Denies easy bruising, bleeding, bleeding gums Neuro: Denies headache, numbness, weakness, slurred speech, loss of memory or consciousness  Past Medical History:  He,  has a past medical history of Anxiety, Cancer (HCC), CHF (congestive heart failure) (HCC), Diabetes mellitus without complication (HCC), GERD (gastroesophageal reflux  disease), Hypertension, Iron deficiency anemia, Obesity, Osteoarthritis of hip, and Peptic ulcer disease.   Surgical History:   Past Surgical History:  Procedure Laterality Date   CARDIOVERSION N/A 11/26/2019   Procedure: CARDIOVERSION;  Surgeon: Dalia Heading, MD;  Location: [facility] ORS;  Service: Cardiovascular;  Laterality: N/A;   COLONOSCOPY     ESOPHAGOGASTRODUODENOSCOPY     GASTRIC BYPASS     2009   NASAL SINUS SURGERY     TOTAL HIP ARTHROPLASTY Right 08/20/2016   Procedure: TOTAL HIP ARTHROPLASTY ANTERIOR APPROACH;  Surgeon: Kennedy Bucker, MD;  Location: [facility] ORS;  Service: Orthopedics;  Laterality: Right;     Social History:   reports that he has never smoked. He has never used smokeless tobacco. He reports that he does not drink alcohol and does not use drugs.   Family History:  His family history includes Dementia in his mother; Diabetes in his father.   Allergies Allergies  Allergen Reactions   Morphine And Codeine Hives   Nsaids Other (See Comments)    Gastric bypass   Opium Hives    Opiate drip when had gastric bypass drug unknown (morphine drip)     Home Medications  Prior to Admission medications   Medication Sig Start Date End Date Taking? Authorizing Provider  amiodarone (PACERONE) 200 MG tablet Take 200 mg by mouth daily.   Yes [provider]  amoxicillin (AMOXIL) 500 MG capsule Take 2,000 mg by mouth once as needed (Pretreatment for Procedure(s)). 06/12/23  Yes [provider]  amoxicillin (AMOXIL) 875 MG tablet Take 875 mg by mouth 2 (two) times daily. 08/14/23 08/21/23 Yes [provider]  betamethasone valerate (VALISONE) 0.1 % cream Apply 1 Application topically 2 (two) times daily as needed. 04/11/23  Yes [provider]  carvedilol (COREG) 6.25 MG tablet TAKE 1 TABLET BY MOUTH TWICE  DAILY 02/11/23  Yes Hackney, Tina A, FNP  Cyanocobalamin (B-12) 2500 MCG TABS Take 5,000 mcg by mouth daily.    Yes [provider]   ELIQUIS 5 MG TABS tablet TAKE 1 TABLET BY MOUTH TWICE  DAILY 02/11/23  Yes Clarisa Kindred A, FNP  FLUoxetine (PROZAC) 40 MG capsule Take 1 capsule by mouth daily. 05/23/23  Yes [provider]  furosemide (LASIX) 40 MG tablet Take 1 tablet by mouth daily. 08/12/23  Yes [provider]  ipratropium (ATROVENT) 0.03 % nasal spray Place 2 sprays into the nose 2 (two) times daily. 08/14/23 08/28/23 Yes [provider]  JARDIANCE 25 MG TABS tablet Take 25 mg by mouth daily. 11/02/19  Yes [provider]  metFORMIN (GLUCOPHAGE) 1000 MG tablet Take 1,000 mg by mouth 2 (two) times daily with a meal.    Yes [provider]  Multiple Vitamin (MULTIVITAMIN WITH MINERALS) TABS tablet Take 1 tablet by mouth daily.   Yes [provider]  pantoprazole (PROTONIX) 40  MG tablet Take 40 mg by mouth every evening.    Yes [provider]  pravastatin (PRAVACHOL) 40 MG tablet Take 40 mg by mouth at bedtime.  09/22/19  Yes [provider]  sacubitril-valsartan (ENTRESTO) 24-26 MG Take 1 tablet by mouth 2 (two) times daily.   Yes [provider]  spironolactone (ALDACTONE) 25 MG tablet TAKE 1 TABLET BY MOUTH DAILY 08/12/23  Yes Hackney, Tina A, FNP  tirzepatide [facility]) 12.5 MG/0.5ML Pen Inject 12.5 mg into the skin once a week. 05/16/23  Yes [provider]  triamcinolone cream (KENALOG) 0.1 % Apply 1 Application topically 2 (two) times daily as needed. 04/07/23  Yes [provider]  benzonatate (TESSALON) 200 MG capsule Take 200 mg by mouth 3 (three) times daily as needed for cough. Patient not taking: Reported on 08/20/2023 08/14/23 08/21/23  [provider]  sildenafil (VIAGRA) 100 MG tablet Take 100 mg by mouth as needed for erectile dysfunction. Patient not taking: Reported on 08/20/2023 10/06/20   [provider]     Critical care time: 70 minutes      Zada Girt, AGNP  Pulmonary/Critical Care Pager  [phone] (please enter 7 digits) PCCM Consult Pager [phone] (please enter 7 digits)

## 2023-08-20 NOTE — ED Notes (Signed)
Patient placed on hospital bed for comfort °

## 2023-08-20 NOTE — Progress Notes (Signed)
Heart Failure Stewardship Pharmacy Note  PCP: Lynnea Ferrier, MD PCP-Cardiologist: None  HPI: William [name]. is a 67 y.o. male with EF <25%, followed by the CHF clinic, A-fib s/p cardioversion with last episode noted in 2021, DM, mild aortic stenosis,known LBBB, history of gastric bypass who presented with worsening edema from feet to scrotum and dyspnea on exertion. Reported recent cold like symptoms that were not helped with amoxicillin. BNP on admission was markedly elevated at 1995.5. HS-troponin was 56. TSH on admission was <0.010, down significantly from 04/2023 where it was WNL. Free T4 is pending.   Pertinent cardiac history: Admitted with AF and acute CHF in 2021. DCCV in 11/2019. Echo in 11/2019 showed LVEF of <20% with grade I diastolic function and moderately reduced RV function. Echo in 12/2022 showed LVEF improved to 40-45% with grade I diastolic dysfunction, mild MR, mild AR, moderate AS.  Pertinent Lab Values: Creatinine, Ser  Date Value Ref Range Status  08/20/2023 1.04 0.61 - 1.24 mg/dL Final   BUN  Date Value Ref Range Status  08/20/2023 23 8 - 23 mg/dL Final   Potassium  Date Value Ref Range Status  08/20/2023 4.1 3.5 - 5.1 mmol/L Final   Sodium  Date Value Ref Range Status  08/20/2023 137 135 - 145 mmol/L Final   B Natriuretic Peptide  Date Value Ref Range Status  08/19/2023 1,995.5 (H) 0.0 - 100.0 pg/mL Final    Comment:    Performed at [facility], [address] [place], [place] [address]   Magnesium  Date Value Ref Range Status  08/20/2023 1.9 1.7 - 2.4 mg/dL Final    Comment:    Performed at [facility], [address] [place], [place] [address]   TSH  Date Value Ref Range Status  08/20/2023 <0.010 (L) 0.350 - 4.500 uIU/mL Final    Comment:    Performed by a 3rd Generation assay with a functional sensitivity of <=0.01 uIU/mL. Performed at [facility], [address] [place], [place] [address]     Vital  Signs:  Temp:  [98.4 F (36.9 C)-99.5 F (37.5 C)] 98.7 F (37.1 C) (12/11 1013) Pulse Rate:  [78-132] 132 (12/11 1045) Cardiac Rhythm: Atrial fibrillation (12/11 1000) Resp:  [15-36] 20 (12/11 1045) BP: (77-93)/(57-79) 83/67 (12/11 1045) SpO2:  [91 %-98 %] 92 % (12/11 1045) Weight:  [113.4 kg (250 lb)] 113.4 kg (250 lb) (12/10 2229)  Intake/Output Summary (Last 24 hours) at 08/20/2023 1111 Last data filed at 08/20/2023 1054 Gross per 24 hour  Intake --  Output 2220 ml  Net -2220 ml    Current Heart Failure Medications:  Loop diuretic: Furosemide 10 mg/hr infusion Beta-Blocker: none ACEI/ARB/ARNI: none MRA: none SGLT2i: none  Prior to admission Heart Failure Medications:  Loop diuretic: furosemide 40 mg daily Beta-Blocker: carvedilol 6.25 mg bid ACEI/ARB/ARNI: Entresto 24-26 mg bid MRA: spironolactone 25 mg daily SGLT2i: Jardiance 25 mg daily   Assessment: 1. Acute on chronic combined systolic and diastolic heart failure (LVEF pending %) with LVEF in 2021 of <20%, improved to 40-45% in 12/2022, due to presumed NICM. NYHA class III symptoms.  -Symptoms: Reports significant improvement in symptoms with diuresis. No dyspnea at rest, still with mild orthopnea and LEE. Denies lightheadedness and dizziness with hypotension. Appetite is good.  -Volume: Still appears volume up, however reports this is improved from previous. Agree with continuing furosemide infusion. -Hemodynamics: BP is extremely low. HR remains elevated from 110-130s. Suspect hyperthyroid is the  major contributor. This may have been induced by amiodarone at home. Extremities are warm and well perfused with no other symptoms of shock.  -BB: Avoid at this time given hypotension and acute HF.  -ACEI/ARB/ARNI: Avoiding at this time given hypotension. -MRA: Avoiding at this time given hypotension. -SGLT2i: Avoiding at this time. -Tachycardia is likely being perpetuated by hyperthyroidism. Amiodarone may be  contributing to thyroid issues and elevated heart rate given previously normal TSH now undetectable. Free T4 pending. Would recommend stopping amiodarone for now while work-up is in progress.   Plan: 1) Medication changes recommended at this time: -Consider starting methimazole 15 mg TID if free T4 is elevated. -Consider holding amiodarone as it can contribute to thyroid dysfunction -Consider magnesium 2g IV   2) Patient assistance: -Pending  3) Education: .-To be completed  Medication Assistance / Insurance Benefits Check: Does the patient have prescription insurance?    Type of insurance plan:  Does the patient qualify for medication assistance through manufacturers or grants? Pending   Outpatient Pharmacy: Prior to admission outpatient pharmacy: [facility]     Please do not hesitate to reach out with questions or concerns,  Enos Fling, PharmD, CPP, BCPS Heart Failure Pharmacist  Phone - [phone] 08/20/2023 12:40 PM

## 2023-08-20 NOTE — Assessment & Plan Note (Addendum)
Dilated cardiomyopathy Hypotension BP 92/67 on arrival, possibly related to increased home Lasix dosing Midodrine for BP support in order to continue GDMT Will start Lasix infusion due to blood pressure Coreg, Entresto and spironolactone with hold parameters Daily weights with intake and output monitoring Echocardiogram Cardiology consult

## 2023-08-20 NOTE — Plan of Care (Signed)
  Problem: Education: Goal: Ability to describe self-care measures that may prevent or decrease complications (Diabetes Survival Skills Education) will improve Outcome: Progressing Goal: Individualized Educational Video(s) Outcome: Progressing   Problem: Coping: Goal: Ability to adjust to condition or change in health will improve Outcome: Progressing   Problem: Fluid Volume: Goal: Ability to maintain a balanced intake and output will improve Outcome: Progressing   Problem: Health Behavior/Discharge Planning: Goal: Ability to identify and utilize available resources and services will improve Outcome: Progressing Goal: Ability to manage health-related needs will improve Outcome: Progressing   Problem: Metabolic: Goal: Ability to maintain appropriate glucose levels will improve Outcome: Progressing   Problem: Nutritional: Goal: Maintenance of adequate nutrition will improve Outcome: Progressing Goal: Progress toward achieving an optimal weight will improve Outcome: Progressing   Problem: Skin Integrity: Goal: Risk for impaired skin integrity will decrease Outcome: Progressing   Problem: Tissue Perfusion: Goal: Adequacy of tissue perfusion will improve Outcome: Progressing   Problem: Education: Goal: Knowledge of General Education information will improve Description: Including pain rating scale, medication(s)/side effects and non-pharmacologic comfort measures Outcome: Progressing   Problem: Health Behavior/Discharge Planning: Goal: Ability to manage health-related needs will improve Outcome: Progressing   Problem: Clinical Measurements: Goal: Ability to maintain clinical measurements within normal limits will improve Outcome: Progressing Goal: Will remain free from infection Outcome: Progressing Goal: Diagnostic test results will improve Outcome: Progressing Goal: Respiratory complications will improve Outcome: Progressing Goal: Cardiovascular complication will  be avoided Outcome: Progressing   Problem: Activity: Goal: Risk for activity intolerance will decrease Outcome: Progressing   Problem: Nutrition: Goal: Adequate nutrition will be maintained Outcome: Progressing   Problem: Coping: Goal: Level of anxiety will decrease Outcome: Progressing   Problem: Elimination: Goal: Will not experience complications related to bowel motility Outcome: Progressing Goal: Will not experience complications related to urinary retention Outcome: Progressing   Problem: Pain Management: Goal: General experience of comfort will improve Outcome: Progressing   Problem: Safety: Goal: Ability to remain free from injury will improve Outcome: Progressing   Problem: Skin Integrity: Goal: Risk for impaired skin integrity will decrease Outcome: Progressing   Problem: Education: Goal: Ability to demonstrate management of disease process will improve Outcome: Progressing Goal: Ability to verbalize understanding of medication therapies will improve Outcome: Progressing Goal: Individualized Educational Video(s) Outcome: Progressing   Problem: Activity: Goal: Capacity to carry out activities will improve Outcome: Progressing   Problem: Cardiac: Goal: Ability to achieve and maintain adequate cardiopulmonary perfusion will improve Outcome: Progressing   Problem: Coping: Goal: Level of anxiety will decrease Outcome: Progressing

## 2023-08-20 NOTE — H&P (Addendum)
History and Physical    Patient: William [name]. UKG:[id number] DOB: 1956-03-09 DOA: 08/20/2023 DOS: the patient was seen and examined on 08/20/2023 PCP: Lynnea Ferrier, MD  Patient coming from: Home  Chief Complaint:  Chief Complaint  Patient presents with   Leg Swelling    HPI: Rommie Zanghi. is a 67 y.o. male with medical history significant for Dilated cardiomyopathy EF <25%, followed by the CHF clinic, A-fib s/p cardioversion, DM, mild aortic stenosis,known LBBB, history of gastric bypass, being admitted with a CHF exacerbation.  Patient presented with a 1 week history of worsening edema with started in his feet, no progressing up to the scrotum.  He also has orthopnea and dyspnea on exertion.   He states that he feels like he was doing well with his CHF until he developed a cold with cough, sore throat and sinus congestion about a week ago.  He saw his PCP who prescribed Amoxil, Atrovent nasal spray and Tessalon Perles which he has been using but without much help.  On awaking on the day of arrival he saw that the swelling had gone up to his scrotum.  He took 120 mg of his Lasix (usual dose 40 mg) and presented to the ED ED course and data review: BP 92/67 with otherwise normal vitals. Workup notable for the following: Troponin 46 and BNP 1995 EKG, personally viewed and interpreted showing NSR at 82 with known LBBB Chest x-ray showing suspected trace left pleural effusion  Patient treated with IV Lasix Hospitalist consulted for admission for CHF exacerbation not responding to outpatient management.   Review of Systems: As mentioned in the history of present illness. All other systems reviewed and are negative.  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   CHF (congestive heart failure) (HCC)    Diabetes mellitus without complication (HCC)    GERD (gastroesophageal reflux disease)    Hypertension    Iron deficiency anemia     Obesity    Osteoarthritis of hip    right   Peptic ulcer disease    Past Surgical History:  Procedure Laterality Date   CARDIOVERSION N/A 11/26/2019   Procedure: CARDIOVERSION;  Surgeon: Dalia Heading, MD;  Location: [facility] ORS;  Service: Cardiovascular;  Laterality: N/A;   COLONOSCOPY     ESOPHAGOGASTRODUODENOSCOPY     GASTRIC BYPASS     2009   NASAL SINUS SURGERY     TOTAL HIP ARTHROPLASTY Right 08/20/2016   Procedure: TOTAL HIP ARTHROPLASTY ANTERIOR APPROACH;  Surgeon: Kennedy Bucker, MD;  Location: [facility] ORS;  Service: Orthopedics;  Laterality: Right;   Social History:  reports that he has never smoked. He has never used smokeless tobacco. He reports that he does not drink alcohol and does not use drugs.  Allergies  Allergen Reactions   Morphine And Codeine Hives   Nsaids Other (See Comments)    Gastric bypass   Opium Hives    Opiate drip when had gastric bypass drug unknown (morphine drip)    Family History  Problem Relation Age of Onset   Dementia Mother    Diabetes Father     Prior to Admission medications   Medication Sig Start Date End Date Taking? Authorizing Provider  amiodarone (PACERONE) 200 MG tablet Take 200 mg by mouth daily.    [provider]  carvedilol (COREG) 6.25 MG tablet TAKE 1 TABLET BY MOUTH TWICE  DAILY  02/11/23   Delma Freeze, FNP  Cyanocobalamin (B-12) 2500 MCG TABS Take 5,000 mcg by mouth daily.     [provider]  ELIQUIS 5 MG TABS tablet TAKE 1 TABLET BY MOUTH TWICE  DAILY 02/11/23   Clarisa Kindred A, FNP  FLUoxetine (PROZAC) 20 MG capsule Take 40 mg by mouth every evening. 07/20/16   [provider]  furosemide (LASIX) 20 MG tablet Take 40 mg by mouth daily.    [provider]  JARDIANCE 25 MG TABS tablet Take 25 mg by mouth daily. 11/02/19   [provider]  metFORMIN (GLUCOPHAGE) 1000 MG tablet Take 1,000 mg by mouth 2 (two) times daily with a meal.     [provider]  Multiple Vitamin  (MULTIVITAMIN WITH MINERALS) TABS tablet Take 1 tablet by mouth daily.    [provider]  pantoprazole (PROTONIX) 40 MG tablet Take 40 mg by mouth every evening.     [provider]  pravastatin (PRAVACHOL) 40 MG tablet Take 40 mg by mouth at bedtime.  09/22/19   [provider]  sacubitril-valsartan (ENTRESTO) 24-26 MG Take 1 tablet by mouth 2 (two) times daily.    [provider]  spironolactone (ALDACTONE) 25 MG tablet TAKE 1 TABLET BY MOUTH DAILY 08/12/23   Clarisa Kindred A, FNP  tirzepatide [facility]) 2.5 MG/0.5ML Pen Inject 10 mg into the skin once a week. Tuesdays    [provider]    Physical Exam: Vitals:   08/19/23 2229 08/19/23 2231  BP:  92/67  Pulse:  83  Resp:  18  Temp:  98.4 F (36.9 C)  TempSrc:  Oral  SpO2:  95%  Weight: 113.4 kg   Height: 5\' 11"  (1.803 m)    Physical Exam Vitals and nursing note reviewed.  Constitutional:      General: He is not in acute distress. HENT:     Head: Normocephalic and atraumatic.  Cardiovascular:     Rate and Rhythm: Normal rate and regular rhythm.     Heart sounds: Normal heart sounds.  Pulmonary:     Effort: Pulmonary effort is normal.     Breath sounds: Normal breath sounds.  Abdominal:     Palpations: Abdomen is soft.     Tenderness: There is no abdominal tenderness.  Musculoskeletal:     Right lower leg: 3+ Edema present.     Left lower leg: 3+ Edema present.  Neurological:     Mental Status: Mental status is at baseline.     Labs on Admission: I have personally reviewed following labs and imaging studies  CBC: Recent Labs  Lab 08/19/23 2232  WBC 10.3  HGB 12.7*  HCT 38.3*  MCV 89.9  PLT 281   Basic Metabolic Panel: Recent Labs  Lab 08/19/23 2232  NA 134*  K 4.6  CL 104  CO2 20*  GLUCOSE 114*  BUN 24*  CREATININE 0.95  CALCIUM 8.4*   GFR: Estimated Creatinine Clearance: 97.9 mL/min (by C-G formula based on SCr of 0.95 mg/dL). Liver Function  Tests: No results for input(s): "AST", "ALT", "ALKPHOS", "BILITOT", "PROT", "ALBUMIN" in the last 168 hours. No results for input(s): "LIPASE", "AMYLASE" in the last 168 hours. No results for input(s): "AMMONIA" in the last 168 hours. Coagulation Profile: No results for input(s): "INR", "PROTIME" in the last 168 hours. Cardiac Enzymes: No results for input(s): "CKTOTAL", "CKMB", "CKMBINDEX", "TROPONINI" in the last 168 hours. BNP (last 3 results) No results for input(s): "PROBNP" in the  last 8760 hours. HbA1C: No results for input(s): "HGBA1C" in the last 72 hours. CBG: No results for input(s): "GLUCAP" in the last 168 hours. Lipid Profile: No results for input(s): "CHOL", "HDL", "LDLCALC", "TRIG", "CHOLHDL", "LDLDIRECT" in the last 72 hours. Thyroid Function Tests: No results for input(s): "TSH", "T4TOTAL", "FREET4", "T3FREE", "THYROIDAB" in the last 72 hours. Anemia Panel: No results for input(s): "VITAMINB12", "FOLATE", "FERRITIN", "TIBC", "IRON", "RETICCTPCT" in the last 72 hours. Urine analysis:    Component Value Date/Time   COLORURINE YELLOW (A) 08/07/2016 1337   APPEARANCEUR CLEAR (A) 08/07/2016 1337   LABSPEC 1.020 08/07/2016 1337   PHURINE 5.0 08/07/2016 1337   GLUCOSEU NEGATIVE 08/07/2016 1337   HGBUR NEGATIVE 08/07/2016 1337   BILIRUBINUR NEGATIVE 08/07/2016 1337   KETONESUR NEGATIVE 08/07/2016 1337   PROTEINUR NEGATIVE 08/07/2016 1337   NITRITE NEGATIVE 08/07/2016 1337   LEUKOCYTESUR NEGATIVE 08/07/2016 1337    Radiological Exams on Admission: DG Chest 1 View  Result Date: 08/19/2023 CLINICAL DATA:  Chest pain, cough EXAM: CHEST  1 VIEW COMPARISON:  11/25/2019 FINDINGS: Lungs are clear. Suspected trace left pleural effusion. No pneumothorax. The heart is top-normal in size. IMPRESSION: Suspected trace left pleural effusion. Electronically Signed   By: Charline Bills M.D.   On: 08/19/2023 22:54     Data Reviewed: Relevant notes from primary care and  specialist visits, past discharge summaries as available in EHR, including Care Everywhere. Prior diagnostic testing as pertinent to current admission diagnoses Updated medications and problem lists for reconciliation ED course, including vitals, labs, imaging, treatment and response to treatment Triage notes, nursing and pharmacy notes and ED provider's notes Notable results as noted in HPI   Assessment and Plan: * Acute on chronic systolic CHF (congestive heart failure) (HCC) Dilated cardiomyopathy Hypotension BP 92/67 on arrival, possibly related to increased home Lasix dosing Midodrine for BP support in order to continue GDMT Will start Lasix infusion due to blood pressure Coreg, Entresto and spironolactone with hold parameters Daily weights with intake and output monitoring Echocardiogram Cardiology consult  Upper respiratory tract infection Patient has been coughing for the past week with sinus drainage No improvement with Amoxil, Atrovent nasal spray and Tessalon Perles Tussionex, Flonase Will add on a sed rate  PAF s/p cardioversion (paroxysmal atrial fibrillation) (HCC) Currently in sinus rhythm Continue apixaban  Aortic stenosis, mild Follow-up echocardiogram  History of gastric bypass No acute issues Patient currently on Mounjaro  Type 2 diabetes mellitus without complication (HCC) Sliding scale insulin coverage     DVT prophylaxis: apixaban  Consults: [facility] cardiology  Advance Care Planning:   Code Status: Prior   Family Communication: none  Disposition Plan: Back to previous home environment  Severity of Illness: The appropriate patient status for this patient is INPATIENT. Inpatient status is judged to be reasonable and necessary in order to provide the required intensity of service to ensure the patient's safety. The patient's presenting symptoms, physical exam findings, and initial radiographic and laboratory data in the context of their chronic  comorbidities is felt to place them at high risk for further clinical deterioration. Furthermore, it is not anticipated that the patient will be medically stable for discharge from the hospital within 2 midnights of admission.   * I certify that at the point of admission it is my clinical judgment that the patient will require inpatient hospital care spanning beyond 2 midnights from the point of admission due to high intensity of service, high risk for further deterioration and high frequency of surveillance  required.*  Author: Andris Baumann, MD 08/20/2023 2:03 AM  For on call review [email or web address].

## 2023-08-20 NOTE — Progress Notes (Signed)
*  PRELIMINARY RESULTS* Echocardiogram 2D Echocardiogram has been performed.  Cristela Blue 08/20/2023, 1:13 PM

## 2023-08-21 ENCOUNTER — Inpatient Hospital Stay: Payer: Managed Care, Other (non HMO)

## 2023-08-21 DIAGNOSIS — I420 Dilated cardiomyopathy: Secondary | ICD-10-CM

## 2023-08-21 DIAGNOSIS — E876 Hypokalemia: Secondary | ICD-10-CM

## 2023-08-21 DIAGNOSIS — I5043 Acute on chronic combined systolic (congestive) and diastolic (congestive) heart failure: Secondary | ICD-10-CM | POA: Diagnosis not present

## 2023-08-21 DIAGNOSIS — E119 Type 2 diabetes mellitus without complications: Secondary | ICD-10-CM | POA: Diagnosis not present

## 2023-08-21 LAB — RESPIRATORY PANEL BY PCR

## 2023-08-21 LAB — MAGNESIUM
Magnesium: 1.9 mg/dL (ref 1.7–2.4)
Magnesium: 2.1 mg/dL (ref 1.7–2.4)

## 2023-08-21 LAB — CBC
HCT: 37.8 % — ABNORMAL LOW (ref 39.0–52.0)
Hemoglobin: 12.8 g/dL — ABNORMAL LOW (ref 13.0–17.0)
MCH: 29.8 pg (ref 26.0–34.0)
MCHC: 33.9 g/dL (ref 30.0–36.0)
MCV: 88.1 fL (ref 80.0–100.0)
Platelets: 260 10*3/uL (ref 150–400)
RBC: 4.29 MIL/uL (ref 4.22–5.81)
RDW: 13.2 % (ref 11.5–15.5)
WBC: 9.4 10*3/uL (ref 4.0–10.5)
nRBC: 0 % (ref 0.0–0.2)

## 2023-08-21 LAB — COOXEMETRY PANEL
Carboxyhemoglobin: <0.3 % — ABNORMAL LOW (ref 0.5–1.5)
Carboxyhemoglobin: 0.9 % (ref 0.5–1.5)
Methemoglobin: <0.7 % (ref 0.0–1.5)
Methemoglobin: <0.7 % (ref 0.0–1.5)
O2 Saturation: 97.1 %
O2 Saturation: 60.8 %
Total hemoglobin: 13.1 g/dL (ref 12.0–16.0)
Total hemoglobin: 12.6 g/dL (ref 12.0–16.0)
Total oxygen content: 97.1 %
Total oxygen content: 60.3 %

## 2023-08-21 LAB — APTT: aPTT: 42 s — ABNORMAL HIGH (ref 24–36)

## 2023-08-21 LAB — BASIC METABOLIC PANEL
Anion gap: 12 (ref 5–15)
BUN: 20 mg/dL (ref 8–23)
CO2: 26 mmol/L (ref 22–32)
Calcium: 8.1 mg/dL — ABNORMAL LOW (ref 8.9–10.3)
Chloride: 99 mmol/L (ref 98–111)
Creatinine, Ser: 0.8 mg/dL (ref 0.61–1.24)
GFR, Estimated: >60 mL/min (ref >60)
Glucose, Bld: 138 mg/dL — ABNORMAL HIGH (ref 70–99)
Potassium: 3.1 mmol/L — ABNORMAL LOW (ref 3.5–5.1)
Sodium: 137 mmol/L (ref 135–145)

## 2023-08-21 LAB — GLUCOSE, CAPILLARY
Glucose-Capillary: 153 mg/dL — ABNORMAL HIGH (ref 70–99)
Glucose-Capillary: 154 mg/dL — ABNORMAL HIGH (ref 70–99)
Glucose-Capillary: 154 mg/dL — ABNORMAL HIGH (ref 70–99)
Glucose-Capillary: 187 mg/dL — ABNORMAL HIGH (ref 70–99)
Glucose-Capillary: 196 mg/dL — ABNORMAL HIGH (ref 70–99)

## 2023-08-21 LAB — POTASSIUM: Potassium: 4.4 mmol/L (ref 3.5–5.1)

## 2023-08-21 LAB — PHOSPHORUS
Phosphorus: 4.8 mg/dL — ABNORMAL HIGH (ref 2.5–4.6)
Phosphorus: 4.2 mg/dL (ref 2.5–4.6)

## 2023-08-21 LAB — T4, FREE: Free T4: 4.83 ng/dL — ABNORMAL HIGH (ref 0.61–1.12)

## 2023-08-21 LAB — HEPARIN LEVEL (UNFRACTIONATED): Heparin Unfractionated: 0.85 [IU]/mL — ABNORMAL HIGH (ref 0.30–0.70)

## 2023-08-21 MED ORDER — METHIMAZOLE 5 MG PO TABS
15.0000 mg | ORAL_TABLET | Freq: Three times a day (TID) | ORAL | Status: DC
  Filled 2023-08-21: qty 1

## 2023-08-21 MED ORDER — POTASSIUM CHLORIDE CRYS ER 20 MEQ PO TBCR
40.0000 meq | EXTENDED_RELEASE_TABLET | ORAL | Status: AC
  Administered 2023-08-21 (×2): 40 meq via ORAL
  Filled 2023-08-21 (×2): qty 2

## 2023-08-21 MED ORDER — HEPARIN BOLUS VIA INFUSION
3000.0000 [IU] | Freq: Once | INTRAVENOUS | Status: AC
  Administered 2023-08-21: 3000 [IU] via INTRAVENOUS
  Filled 2023-08-21: qty 3000

## 2023-08-21 MED ORDER — MAGNESIUM SULFATE 2 GM/50ML IV SOLN
2.0000 g | Freq: Once | INTRAVENOUS | Status: AC
  Administered 2023-08-21: 2 g via INTRAVENOUS
  Filled 2023-08-21: qty 50

## 2023-08-21 MED ORDER — APIXABAN 5 MG PO TABS
5.0000 mg | ORAL_TABLET | Freq: Two times a day (BID) | ORAL | Status: DC
  Administered 2023-08-21 – 2023-08-24 (×7): 5 mg via ORAL
  Filled 2023-08-21 (×7): qty 1

## 2023-08-21 MED ORDER — NOREPINEPHRINE 4 MG/250ML-% IV SOLN
0.0000 ug/min | INTRAVENOUS | Status: DC
Start: 2023-08-21 — End: 2023-08-22
  Administered 2023-08-21: 5 ug/min via INTRAVENOUS
  Filled 2023-08-21: qty 250

## 2023-08-21 MED ORDER — TRAZODONE HCL 50 MG PO TABS
50.0000 mg | ORAL_TABLET | Freq: Every evening | ORAL | Status: DC | PRN
  Administered 2023-08-23: 50 mg via ORAL
  Filled 2023-08-21: qty 1

## 2023-08-21 MED ORDER — METHIMAZOLE 10 MG PO TABS
10.0000 mg | ORAL_TABLET | Freq: Three times a day (TID) | ORAL | Status: DC
  Administered 2023-08-21 (×3): 10 mg via ORAL
  Filled 2023-08-21 (×3): qty 1

## 2023-08-21 MED ORDER — GUAIFENESIN 100 MG/5ML PO LIQD: 5.0000 mL | ORAL | Status: DC | PRN

## 2023-08-21 MED ORDER — MENTHOL 3 MG MT LOZG: 1.0000 | LOZENGE | OROMUCOSAL | Status: DC | PRN

## 2023-08-21 MED ORDER — PREDNISONE 20 MG PO TABS: 40.0000 mg | ORAL_TABLET | Freq: Every day | ORAL | Status: DC

## 2023-08-21 MED ORDER — PREDNISONE 20 MG PO TABS
40.0000 mg | ORAL_TABLET | Freq: Every day | ORAL | Status: DC
  Administered 2023-08-21 – 2023-08-24 (×4): 40 mg via ORAL
  Filled 2023-08-21 (×5): qty 2

## 2023-08-21 NOTE — Progress Notes (Signed)
Heart Failure Navigator Progress Note  Assessed for Heart & Vascular TOC clinic readiness.  Patient does not meet criteria due to Advanced Heart Failure Team patient of Dr. Clearnce Hasten, MD.  Navigator will sign off at this time.  Roxy Horseman, RN, BSN [facility] Heart Failure Navigator Secure Chat Only

## 2023-08-21 NOTE — Progress Notes (Signed)
[facility] Progress Note  Patient: William [name].    ZOX:[id number]  DOA: 08/20/2023     Date of Service: the patient was seen and examined on 08/21/2023  Chief Complaint  Patient presents with   Leg Swelling   Brief hospital course: William Recendez. is a 67 y.o. male with medical history significant for Dilated cardiomyopathy EF <25%, followed by the CHF clinic, A-fib s/p cardioversion, DM, mild aortic stenosis,known LBBB, history of gastric bypass, being admitted with a CHF exacerbation.  Patient presented with a 1 week history of worsening edema with started in his feet, no progressing up to the scrotum.  He also has orthopnea and dyspnea on exertion.   He states that he feels like he was doing well with his CHF until he developed a cold with cough, sore throat and sinus congestion about a week ago.  He saw his PCP who prescribed Amoxil, Atrovent nasal spray and Tessalon Perles which he has been using but without much help.  On awaking on the day of arrival he saw that the swelling had gone up to his scrotum.  He took 120 mg of his Lasix (usual dose 40 mg) and presented to the ED ED course and data review: BP 92/67 with otherwise normal vitals. Workup notable for the following: Troponin 46 and BNP 1995 EKG, personally viewed and interpreted showing NSR at 82 with known LBBB Chest x-ray showing suspected trace left pleural effusion   Patient treated with IV Lasix Hospitalist consulted for admission for CHF exacerbation not responding to outpatient management.   Assessment and Plan:  A-fib/atrial flutter with RVR, hypotensive H/o PAF s/p cardioversion (paroxysmal atrial fibrillation)  Continue apixaban S/p IV Digoxin  12/11 s/p amiodarone IV bolus followed by amiodarone IV infusion d/c'd on 12/12  12/11 persistent hypotension, started Levophed via peripheral line Cardiology consulted 12/12 still low BP remained on Levophed Cardio planning for TEE cardioversion on Monday if  remains in A-fib  Acute on chronic systolic CHF (congestive heart failure) Dilated cardiomyopathy Hypotension BP 92/67 on arrival, possibly related to increased home Lasix dosing S/p Midodrine for BP support in order to continue GDMT Continue Lasix infusion due to blood pressure Held Coreg, Entresto and spironolactone due to hypotension Daily weights with intake and output monitoring BNP 1995 elevated, Trop mildly elevated, demand ischemia D-dimer elevated, CTA negative for PE TTE LVEF 20 to 25%, global hypokinesis, moderately dilated.  LA moderately dilated.  Moderate AS.  Cardiology consult and heart failure team consulted    Hyperthyroidism, new diagnosis Patient presented with A-fib with RVR Free T4 level 4.65 elevated TSH 0.01 very low Free T4 level 4.65---4.8 12/11 started methimazole 20 mg p.o. daily, generally decreased dose for maintenance as per improvement of free T4 US thyroid:Moderately heterogeneous, mildly enlarged thyroid. No nodules requiring follow-up or biopsy. Follow thyroid antibodies Check free T4 level daily  Upper respiratory tract infection Patient has been coughing for the past week with sinus drainage No improvement with Amoxil, Atrovent nasal spray and Tessalon Perles Tussionex, Flonase      History of gastric bypass No acute issues Patient currently on Mounjaro   Type 2 diabetes mellitus without complication  Sliding scale insulin coverage     Body mass index is 35.05 kg/m.  Interventions:  Diet: Heart healthy/carb modified diet DVT Prophylaxis: Therapeutic Anticoagulation with Eliquis    Advance goals of care discussion: Full code  Family Communication: family was not present at bedside, at the time of interview.  The pt provided permission to discuss medical plan with the family. Opportunity was given to ask question and all questions were answered satisfactorily.   Disposition:  Pt is from home, admitted with A-fib with RVR, CHF  exacerbation, found to have hyperthyroidism, still critically ill, which precludes a safe discharge. Discharge to home, when stable, and cleared by cardiology.  May need few more days to improve.  Subjective: No significant events overnight, patient is feeling improvement and shortness of breath and lower extremity edema, denied any chest palpitations. Patient was resting comfortably on the recliner.   Physical Exam: General: NAD, lying comfortably Appear in no distress, affect appropriate Eyes: PERRLA ENT: Oral Mucosa Clear, moist  Neck: no JVD,  Cardiovascular: Irregular rhythm, no Murmur,  Respiratory: good respiratory effort, Bilateral Air entry equal and Decreased, no Crackles, no wheezes Abdomen: Bowel Sound present, Soft and no tenderness,  Skin: no rashes Extremities: 3- 4+ pedal edema, no calf tenderness, improving Neurologic: without any new focal findings Gait not checked due to patient safety concerns  Vitals:   09/19/2023 0900 09/19/23 1000 September 19, 2023 1100 2023/09/19 1200  BP: 91/62 (!) 85/68 104/81 107/73  Pulse: (!) 123 94 (!) 112 (!) 107  Resp: 20 19 14  (!) 25  Temp:    97.9 F (36.6 C)  TempSrc:    Oral  SpO2: 95% 94% 96% 93%  Weight:      Height:        Intake/Output Summary (Last 24 hours) at Sep 19, 2023 1404 Last data filed at 09/19/2023 1238 Gross per 24 hour  Intake 1051.84 ml  Output 3475 ml  Net -2423.16 ml   Filed Weights   08/19/23 2229 09-19-2023 0500  Weight: 113.4 kg 114 kg    Data Reviewed: I have personally reviewed and interpreted daily labs, tele strips, imagings as discussed above. I reviewed all nursing notes, pharmacy notes, vitals, pertinent old records I have discussed plan of care as described above with RN and patient/family.  CBC: Recent Labs  Lab 08/19/23 2232 2023/09/19 0550  WBC 10.3 9.4  HGB 12.7* 12.8*  HCT 38.3* 37.8*  MCV 89.9 88.1  PLT 281 260   Basic Metabolic Panel: Recent Labs  Lab 08/19/23 2232 08/20/23 0732  08/20/23 2011 09/19/2023 0550  NA 134* 137 137 137  K 4.6 4.1 3.5 3.1*  CL 104 103 103 99  CO2 20* 22 27 26   GLUCOSE 114* 127* 177* 138*  BUN 24* 23 23 20   CREATININE 0.95 1.04 0.87 0.80  CALCIUM 8.4* 8.0* 8.3* 8.1*  MG  --  1.9 2.0 1.9  PHOS  --  4.9* 4.9* 4.8*    Studies: US THYROID Result Date: 2023-09-19 CLINICAL DATA:  Hyperthyroidism EXAM: THYROID ULTRASOUND TECHNIQUE: Ultrasound examination of the thyroid gland and adjacent soft tissues was performed. COMPARISON:  None available. FINDINGS: Parenchymal Echotexture: Mildly heterogenous Isthmus: 0.4 cm Right lobe: 6.0 x 2.2 x 2.5 cm Left lobe: 5.2 x 2.2 x 1.9 cm _________________________________________________________ Estimated total number of nodules >/= 1 cm: 0 Number of spongiform nodules >/=  2 cm not described below (TR1): 0 Number of mixed cystic and solid nodules >/= 1.5 cm not described below (TR2): 0 _________________________________________________________ Nodule 1: 0.7 x 0.6 x 0.4 cm solid hypoechoic isthmus nodule does not meet criteria for imaging surveillance or FNA. IMPRESSION: 1. Moderately heterogeneous, mildly enlarged thyroid. 2. No nodules requiring follow-up or biopsy. The above is in keeping with the ACR TI-RADS recommendations - J Am Coll Radiol 2017;14:587-595. Electronically Signed  By: Mauri Reading  Mir M.D.   On: 08/21/2023 12:14   DG Chest Port 1 View Result Date: 08/20/2023 CLINICAL DATA:  Check PICC placement EXAM: PORTABLE CHEST 1 VIEW COMPARISON:  08/19/2023 FINDINGS: Cardiac shadow is stable. Right PICC is noted at the cavoatrial junction in satisfactory position. The lungs are clear bilaterally. No focal infiltrate or effusion is noted. No bony abnormality is seen. IMPRESSION: Right PICC in satisfactory position. Electronically Signed   By: Alcide Clever M.D.   On: 08/20/2023 20:06   [place] EKG SITE RITE Result Date: 08/20/2023 If Site Rite image not attached, placement could not be confirmed due to current  cardiac rhythm.   Scheduled Meds:  apixaban  5 mg Oral BID   Chlorhexidine Gluconate Cloth  6 each Topical Q0600   chlorpheniramine-HYDROcodone  5 mL Oral Q12H   fluticasone  1 spray Each Nare Daily   insulin aspart  0-15 Units Subcutaneous TID WC   insulin aspart  0-5 Units Subcutaneous QHS   methIMAzole  10 mg Oral TID   mupirocin ointment  1 Application Nasal BID   pravastatin  40 mg Oral QHS   predniSONE  40 mg Oral Q breakfast   sodium chloride flush  10-40 mL Intracatheter Q12H   Continuous Infusions:  sodium chloride     furosemide (LASIX) 200 mg in dextrose 5 % 100 mL (2 mg/mL) infusion 10 mg/hr (08/21/23 1238)   norepinephrine (LEVOPHED) Adult infusion     PRN Meds: acetaminophen **OR** acetaminophen, guaiFENesin, menthol-cetylpyridinium, ondansetron **OR** ondansetron (ZOFRAN) IV, sodium chloride flush  Time spent: 55 minutes  Author: Gillis Santa. MD [facility] 08/21/2023 2:04 PM  To reach On-call, see care teams to locate the attending and reach out to them via [email or web address]. If 7PM-7AM, please contact night-coverage If you still have difficulty reaching the attending provider, please page the [facility] (Director on Call) for [facility] on amion for assistance.

## 2023-08-21 NOTE — Consult Note (Signed)
Pharmacy Consult Note - Anticoagulation  Pharmacy Consult for heparin Indication: atrial fibrillation  PATIENT MEASUREMENTS: Height: 5\' 11"  (180.3 cm) Weight: 114 kg (251 lb 5.2 oz) IBW/kg (Calculated) : 75.3 HEPARIN DW (KG): 99.9  Recent Labs    08/20/23 0050 08/20/23 0732 08/20/23 1801 08/20/23 2011 08/21/23 0550  HGB  --   --   --   --  12.8*  HCT  --   --   --   --  37.8*  PLT  --   --   --   --  260  APTT  --    < > 36  --  42*  LABPROT  --   --  15.8*  --   --   INR  --   --  1.2  --   --   HEPARINUNFRC  --    < > >1.10*  --  0.85*  CREATININE  --    < >  --    < > 0.80  TROPONINIHS 46*  --   --   --   --    < > = values in this interval not displayed.    Estimated Creatinine Clearance: 116.7 mL/min (by C-G formula based on SCr of 0.8 mg/dL).  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 male with PMH including Afib, HFrEF, aortic stenosis, LBBB is presenting with acute HFrEF. Patient is on chronic anticoagulation with apixaban per chart review. Patient may need a cardiac catheterization so pharmacy has been consulted to initiate and manage heparin intravenous infusion.  Pertinent medications: Apixaban 5 mg twice daily Last dose 12/11 @ 0926  Goal(s) of therapy: Heparin level 0.3 - 0.7 units/mL aPTT 66 - 102 seconds Monitor platelets by anticoagulation protocol: Yes   Baseline anticoagulation labs: Recent Labs    08/19/23 2232 08/20/23 1801 08/21/23 0550  APTT  --  36 42*  INR  --  1.2  --   HGB 12.7*  --  12.8*  PLT 281  --  260   Baseline aPTT 36s, INR 1.2, HL > 1.1  1212 0550 aPTT 42s, HL 0.85; subthera, 1500  un/hr   PLAN: aPTT is subtherapeutic, HL remains elevated in setting of apixaban interference Heparin 3000 unit IV bolus and increase heparin infusion rate to 1800 units/hr Check aPTT in 6 hours after rate change Continue to titrate by aPTT until heparin level and aPTT correlate and/or apixaban washes out, then titrate by heparin level alone. Check heparin level with next AM labs. Continue to monitor CBC daily while on heparin infusion  Tressie Ellis 08/21/2023 8:18 AM

## 2023-08-21 NOTE — TOC CM/SW Note (Signed)
Transition of Care [facility]) - Inpatient Brief Assessment   Patient Details  Name: William [name]. MRN: [id number] Date of Birth: 04/27/1956  [facility] CM/SW Contact:    Chapman Fitch, RN Phone Number: 08/21/2023, 2:48 PM   Clinical Narrative:   Patient currently on levophed and lasix drip  Heart failure navigator has signed off Transition of Care Asessment: Insurance and Status: Insurance coverage has been reviewed Patient has primary care physician: Yes     Prior/Current Home Services: No current home services Social Drivers of Health Review: SDOH reviewed no interventions necessary Readmission risk has been reviewed: Yes Transition of care needs: no transition of care needs at this time

## 2023-08-21 NOTE — Consult Note (Signed)
Advanced Heart Failure Team Consult Note   Primary Physician: Lynnea Ferrier, MD PCP-Cardiologist:  Julien Nordmann, MD  Reason for Consultation: heart failure/atrial fibrillation  HPI:    William [name]. is a 67 y.o. male with a PMH of atrial fibrillation, HFmrEF, HTN, GERD who is seen today for evaluation of heart failure at the request of ICU.  Patient reports that for the past 2 weeks he has been dealing with initially cold symptoms.  He had a cough, some throat pain, and a runny nose.  He also began having trouble sleeping and noted that he was up at late hours of the night sending emails because he was unable to sleep.  He also noticed that he was having some difficulty lying flat down in bed.  He finally developed worsening swelling in his lower extremities up to his scrotum and so at that time went to the hospital.  In the emergency department he was found to be borderline hypotensive and atrial fibrillation with RVR.  He was placed on IV amiodarone and admitted to the MICU for further management.        Objective:    Vital Signs:   Temp:  [97.7 F (36.5 C)-99.5 F (37.5 C)] 97.7 F (36.5 C) (12/12 0843) Pulse Rate:  [70-128] 112 (12/12 1100) Resp:  [13-26] 14 (12/12 1100) BP: (75-109)/(54-85) 104/81 (12/12 1100) SpO2:  [80 %-98 %] 96 % (12/12 1100) Weight:  [409 kg] 114 kg (12/12 0500) Last BM Date : 08/20/23  Weight change: Filed Weights   08/19/23 2229 08/21/23 0500  Weight: 113.4 kg 114 kg    Intake/Output:   Intake/Output Summary (Last 24 hours) at 08/21/2023 1159 Last data filed at 08/21/2023 1045 Gross per 24 hour  Intake 728.57 ml  Output 3450 ml  Net -2721.43 ml      Physical Exam    General:  Well appearing. No resp difficulty HEENT: NCAT Cor:  Regular rate & rhythm.  Systolic murmur. JVP mildly elevated.  1+ LE edema Lungs: Normal work of breathing, clear to auscultation bilaterally Abdomen: soft, nontender,  nondistended Extremities: no cyanosis, clubbing, rash Neuro: alert & orientedx3, cranial nerves grossly intact. moves all 4 extremities w/o difficulty. Affect pleasant Vascular: 2+ radial pulses   Telemetry   Now sinus rhythm in the 80s  Labs and medications reviewed in Epic.  Patient Profile   William [name]. is a 67 y.o. male with a PMH of atrial fibrillation, HFmrEF, HTN, GERD who presented with acute on chronic systolic heart failure exacerbation and atrial fibrillation with RVR in the setting of type II amiodarone induced thyrotoxicosis.  Assessment/Plan   Type II AIT: Previously normal thyroid labs as recently as 04/2023.  TSH undetectable with elevated free T4 on arrival.  No history of thyroid disease, thyroid ultrasound normal.  While thyroid workup is pending, most likely etiology is type II amiodarone induced thyrotoxicosis.  Will start on oral prednisone as well as methimazole and have stopped amiodarone. -Thyroid ultrasound reviewed and unremarkable -Autoimmune thyroid labs pending -Methimazole 10 mg 3 times daily -Continue prednisone 40 mg daily  Acute on chronic systolic heart failure: Patient presented with worsening swelling, responsive to Lasix drip overnight.  Low blood pressures, but not far off his baseline of low 100s seen in clinic.  Lactate reassuringly normal. -Holding GDMT in the setting of norepinephrine  -Hopeful weaning of norepinephrine in the next 24 hours -Will restart GDMT as tolerated -Continue Lasix drip, can likely  transition to bolus dosing tomorrow  Atrial fibrillation: Likely induced by thyrotoxicosis, spontaneously converted after brief time on IV amiodarone and 1 dose of digoxin.  Reports missing 2 doses of Eliquis. -Resume Eliquis -Plan on canceling TEE cardioversion if remains in sinus  CRITICAL CARE Performed by: [name]   Total critical care time: 60 minutes  Critical care time was exclusive of separately billable  procedures and treating other patients.  Critical care was necessary to treat or prevent imminent or life-threatening deterioration.  Critical care was time spent personally by me on the following activities: development of treatment plan with patient and/or surrogate as well as nursing, discussions with consultants, evaluation of patient's response to treatment, examination of patient, obtaining history from patient or surrogate, ordering and performing treatments and interventions, ordering and review of laboratory studies, ordering and review of radiographic studies, pulse oximetry and re-evaluation of patient's condition.   Length of Stay: 1  [name], MD  08/21/2023, 11:59 AM  Advanced Heart Failure Team Pager [phone] (M-F; 7a - 5p)  Please contact [facility] for night-coverage after hours (4p -7a ) and weekends on [email or web address]

## 2023-08-21 NOTE — Progress Notes (Signed)
NAME:  William Cayabyab., MRN:  [id number], DOB:  1956/08/16, LOS: 1 ADMISSION DATE:  08/20/2023, CONSULTATION DATE: 08/20/2023 REFERRING MD: Dr. Mariah Milling, CHIEF COMPLAINT: Shortness of Breath   History of Present Illness:  This is a 67 yo male with a PMH of dilated cardiomyopathy EF <25% (followed by the CHF clinic), A-fibb s/p cardioversion, Type II diabetes mellitus, mild aortic stenosis, LBBB, and gastric bypass.  He presented to [facility] ER on 12/10 with dry cough, shortness of breath, sore throat, chest congestion, and bilateral lower extremity swelling over the past week.  He reported he has been compliant with taking his furosemide 40 mg daily.  However, on 12/10 he took a total of 120 mg of furosemide due to symptoms which did not increase his urinary output.  Symptoms persisted prompting ER visit.  ED Course Upon arrival to the ER significant lab results were: Na+ 134/CO2 20/BUN 24/BNP 1,995.5/troponin 56.  CXR concerning for trace left pleural effusion.  COVID/Influenza A&B/RSV negative.  Initial EKG revealed NSR, hr 82, LBBB.  CTA Chest negative for PE.   40 mg iv lasix administered x1 dose.  He was subsequently admitted to the stepdown unit per hospitalist team for additional workup and treatment.  However, he remained in the ER pending bed availability.  See detailed hospital course below under significant events.  CTA Chest PE:  No evidence of arterial embolus. Mild cardiomegaly with left chamber predominance, and moderate coronary calcifications. Enlarged pulmonary trunk 3.3 cm indicating arterial hypertension. Mild interstitial edema in the lower lobes, with small to moderate symmetric layering pleural effusions similar to 2021. Diffuse bronchial thickening, greater in the lower lobes. Posterior atelectasis. Aortic and coronary artery atherosclerosis. Moderate to heavy calcifications extending across the aortic valve leaflets. Echocardiography may be helpful. Hepatic steatosis. 4.6 cm right  renal cyst, with no wall complexity on prior ultrasound. No follow-up imaging recommended. Unchanged. Aortic Atherosclerosis (ICD10-I70.0).   Pertinent  Medical History  Anxiety  Type II Diabetes Mellitus GERD  HTN  Iron Deficiency Anemia  Obesity  Osteoarthritis  Peptic Ulcer Disease  Chronic Systolic CHF   Significant Hospital Events: Including procedures, antibiotic start and stop dates in addition to other pertinent events   12/11: Pt admitted with acute on chronic diastolic and systolic CHF exacerbation, URI, paroxysmal atrial fibrillation, and hyperthyroidism requiring lasix gtt and scheduled methimazole.  He later developed hypotension along with atrial fibrillation with rvr requiring levophed gtt, 0.25 mg iv digoxin x1 dose, and 150 mg amiodarone bolus per cardiology.  PCCM team consulted to assist with management  12/12: Pt remains on levophed and lasix gtts diuresing well.  Heart failure team to evaluate pt today   Interim History / Subjective:  Pts only complaint is a sore throat and states his shortness of breath has resolved   Objective   Blood pressure 97/69, pulse (!) 122, temperature 99 F (37.2 C), temperature source Oral, resp. rate (!) 24, height 5\' 11"  (1.803 m), weight 114 kg, SpO2 95%.        Intake/Output Summary (Last 24 hours) at 08/21/2023 0841 Last data filed at 08/21/2023 0631 Gross per 24 hour  Intake 728.57 ml  Output 4300 ml  Net -3571.43 ml   Filed Weights   08/19/23 2229 08/21/23 0500  Weight: 113.4 kg 114 kg    Examination: General: Acutely-ill appearing male, NAD on RA  HENT: Supple, no JVD present  Lungs: Clear throughout, even, non laobred Cardiovascular: Irregular irregular, no r/g, 2+ radial/1+ distal pulses, trace  bilateral lower extremity edema  Abdomen: +BS x4, soft, non tender, non distended  Extremities: Moves all extremities  Neuro: Alert and oriented, following commands, PERRLA  GU: Voiding   Resolved Hospital Problem list      Assessment & Plan:   #Acute on chronic systolic and diastolic CHF  #Paroxysmal atrial fibrillation with rvr~resolved   #Mildly elevated troponin likely secondary to demand ischemia  #Cardiogenic shock  Hx: Dilated cardiomyopathy and HTN Echo 08/20/23: EF 20 to 25%; left ventricle with global hypokinesis; mild to moderate mitral valve regurgitation - Continuous telemetry monitoring - Continue lasix gtt  - Prn levophed gtt to maintain map 65 or higher and sbp greater than 90 - Continue pravastatin and apixaban - Cardiology consulted appreciate input - Heart Failure Team consulted appreciate input  - Trend cooxy   #Acute respiratory failure - Supplemental O2 for dyspnea and/or hypoxia  - Maintain O2 sats 92% or higher - Intermittent CXR and prn ABG  #Hypokalemia  - Trend BMP - Replace electrolytes as indicated  - Strict intake/output   #URI  - Trend WBC and monitor fever curve  - Follow PCT   #Hyperthyroidism  TSH <0.010/free T4 4.65 on admission  - Continue schedule methimazole  - Follow thyroid panel  - US thyroid pending   #Type II diabetes mellitus  Hemoglobin A1c 08/20/23: 6.1 - CBG's ac/hs  - SSI  - Follow hypo/hyperglycemic protocol  - Target range 140 to 180  Best Practice (right click and "Reselect all SmartList Selections" daily)   Diet/type: Regular consistency (see orders) DVT prophylaxis Apixaban  Pressure ulcer(s): N/A GI prophylaxis: N/A Lines: Yes and still needed  Foley:  N/A Code Status:  full code Last date of multidisciplinary goals of care discussion [08/21/2023]  12/12: Pt updated regarding current plan of care and all questions answered  Labs   CBC: Recent Labs  Lab 08/19/23 2232 08/21/23 0550  WBC 10.3 9.4  HGB 12.7* 12.8*  HCT 38.3* 37.8*  MCV 89.9 88.1  PLT 281 260    Basic Metabolic Panel: Recent Labs  Lab 08/19/23 2232 08/20/23 0732 08/20/23 2011 08/21/23 0550  NA 134* 137 137 137  K 4.6 4.1 3.5 3.1*  CL 104  103 103 99  CO2 20* 22 27 26   GLUCOSE 114* 127* 177* 138*  BUN 24* 23 23 20   CREATININE 0.95 1.04 0.87 0.80  CALCIUM 8.4* 8.0* 8.3* 8.1*  MG  --  1.9 2.0 1.9  PHOS  --  4.9* 4.9* 4.8*   GFR: Estimated Creatinine Clearance: 116.7 mL/min (by C-G formula based on SCr of 0.8 mg/dL). Recent Labs  Lab 08/19/23 2232 08/20/23 1723 08/21/23 0550  WBC 10.3  --  9.4  LATICACIDVEN  --  1.1  --     Liver Function Tests: No results for input(s): "AST", "ALT", "ALKPHOS", "BILITOT", "PROT", "ALBUMIN" in the last 168 hours. No results for input(s): "LIPASE", "AMYLASE" in the last 168 hours. No results for input(s): "AMMONIA" in the last 168 hours.  ABG    Component Value Date/Time   O2SAT 97.1 08/21/2023 0500     Coagulation Profile: Recent Labs  Lab 08/20/23 1801  INR 1.2    Cardiac Enzymes: No results for input(s): "CKTOTAL", "CKMB", "CKMBINDEX", "TROPONINI" in the last 168 hours.  HbA1C: Hgb A1c MFr Bld  Date/Time Value Ref Range Status  08/20/2023 07:32 AM 6.1 (H) 4.8 - 5.6 % Final    Comment:    (NOTE) Pre diabetes:  5.7%-6.4%  Diabetes:              >6.4%  Glycemic control for   <7.0% adults with diabetes     CBG: Recent Labs  Lab 08/20/23 0740 08/20/23 1137 08/20/23 1649 08/20/23 2136 08/21/23 0810  GLUCAP 95 154* 153* 137* 154*    Review of Systems: Positives in BOLD   Gen: Denies fever, chills, weight change, fatigue, night sweats HEENT: Denies blurred vision, double vision, hearing loss, tinnitus, sinus congestion, rhinorrhea, sore throat, neck stiffness, dysphagia PULM: intermittent shortness of breath, non productive cough, sputum production, hemoptysis, wheezing CV: Denies chest pain, edema, orthopnea, paroxysmal nocturnal dyspnea, palpitations GI: Denies abdominal pain, nausea, vomiting, diarrhea, hematochezia, melena, constipation, change in bowel habits GU: Denies dysuria, hematuria, polyuria, oliguria, urethral discharge Endocrine:  Denies hot or cold intolerance, polyuria, polyphagia or appetite change Derm: Denies rash, dry skin, scaling or peeling skin change Heme: Denies easy bruising, bleeding, bleeding gums Neuro: Denies headache, numbness, weakness, slurred speech, loss of memory or consciousness  Past Medical History:  He,  has a past medical history of Anxiety, Cancer (HCC), CHF (congestive heart failure) (HCC), Diabetes mellitus without complication (HCC), GERD (gastroesophageal reflux disease), Hypertension, Iron deficiency anemia, Obesity, Osteoarthritis of hip, and Peptic ulcer disease.   Surgical History:   Past Surgical History:  Procedure Laterality Date   CARDIOVERSION N/A 11/26/2019   Procedure: CARDIOVERSION;  Surgeon: Dalia Heading, MD;  Location: [facility] ORS;  Service: Cardiovascular;  Laterality: N/A;   COLONOSCOPY     ESOPHAGOGASTRODUODENOSCOPY     GASTRIC BYPASS     2009   NASAL SINUS SURGERY     TOTAL HIP ARTHROPLASTY Right 08/20/2016   Procedure: TOTAL HIP ARTHROPLASTY ANTERIOR APPROACH;  Surgeon: Kennedy Bucker, MD;  Location: [facility] ORS;  Service: Orthopedics;  Laterality: Right;     Social History:   reports that he has never smoked. He has never used smokeless tobacco. He reports that he does not drink alcohol and does not use drugs.   Family History:  His family history includes Dementia in his mother; Diabetes in his father.   Allergies Allergies  Allergen Reactions   Morphine And Codeine Hives   Nsaids Other (See Comments)    Gastric bypass   Opium Hives    Opiate drip when had gastric bypass drug unknown (morphine drip)     Home Medications  Prior to Admission medications   Medication Sig Start Date End Date Taking? Authorizing Provider  amiodarone (PACERONE) 200 MG tablet Take 200 mg by mouth daily.   Yes [provider]  amoxicillin (AMOXIL) 500 MG capsule Take 2,000 mg by mouth once as needed (Pretreatment for Procedure(s)). 06/12/23  Yes [provider]   amoxicillin (AMOXIL) 875 MG tablet Take 875 mg by mouth 2 (two) times daily. 08/14/23 08/21/23 Yes [provider]  betamethasone valerate (VALISONE) 0.1 % cream Apply 1 Application topically 2 (two) times daily as needed. 04/11/23  Yes [provider]  carvedilol (COREG) 6.25 MG tablet TAKE 1 TABLET BY MOUTH TWICE  DAILY 02/11/23  Yes Hackney, Tina A, FNP  Cyanocobalamin (B-12) 2500 MCG TABS Take 5,000 mcg by mouth daily.    Yes [provider]  ELIQUIS 5 MG TABS tablet TAKE 1 TABLET BY MOUTH TWICE  DAILY 02/11/23  Yes Clarisa Kindred A, FNP  FLUoxetine (PROZAC) 40 MG capsule Take 1 capsule by mouth daily. 05/23/23  Yes [provider]  furosemide (LASIX) 40 MG tablet Take 1 tablet by mouth  daily. 08/12/23  Yes [provider]  ipratropium (ATROVENT) 0.03 % nasal spray Place 2 sprays into the nose 2 (two) times daily. 08/14/23 08/28/23 Yes [provider]  JARDIANCE 25 MG TABS tablet Take 25 mg by mouth daily. 11/02/19  Yes [provider]  metFORMIN (GLUCOPHAGE) 1000 MG tablet Take 1,000 mg by mouth 2 (two) times daily with a meal.    Yes [provider]  Multiple Vitamin (MULTIVITAMIN WITH MINERALS) TABS tablet Take 1 tablet by mouth daily.   Yes [provider]  pantoprazole (PROTONIX) 40 MG tablet Take 40 mg by mouth every evening.    Yes [provider]  pravastatin (PRAVACHOL) 40 MG tablet Take 40 mg by mouth at bedtime.  09/22/19  Yes [provider]  sacubitril-valsartan (ENTRESTO) 24-26 MG Take 1 tablet by mouth 2 (two) times daily.   Yes [provider]  spironolactone (ALDACTONE) 25 MG tablet TAKE 1 TABLET BY MOUTH DAILY 08/12/23  Yes Hackney, Tina A, FNP  tirzepatide [facility]) 12.5 MG/0.5ML Pen Inject 12.5 mg into the skin once a week. 05/16/23  Yes [provider]  triamcinolone cream (KENALOG) 0.1 % Apply 1 Application topically 2 (two) times daily as needed. 04/07/23  Yes [provider]  benzonatate (TESSALON) 200 MG capsule Take 200 mg by mouth 3 (three) times daily as needed for cough. Patient not taking: Reported on 08/20/2023 08/14/23 08/21/23  [provider]  sildenafil (VIAGRA) 100 MG tablet Take 100 mg by mouth as needed for erectile dysfunction. Patient not taking: Reported on 08/20/2023 10/06/20   [provider]     Critical care time: 40 minutes      Zada Girt, AGNP  Pulmonary/Critical Care Pager [phone] (please enter 7 digits) PCCM Consult Pager [phone] (please enter 7 digits)

## 2023-08-22 DIAGNOSIS — I5043 Acute on chronic combined systolic (congestive) and diastolic (congestive) heart failure: Secondary | ICD-10-CM | POA: Diagnosis not present

## 2023-08-22 LAB — BASIC METABOLIC PANEL
Anion gap: 9 (ref 5–15)
BUN: 20 mg/dL (ref 8–23)
CO2: 27 mmol/L (ref 22–32)
Calcium: 8.3 mg/dL — ABNORMAL LOW (ref 8.9–10.3)
Chloride: 100 mmol/L (ref 98–111)
Creatinine, Ser: 0.84 mg/dL (ref 0.61–1.24)
GFR, Estimated: >60 mL/min (ref >60)
Glucose, Bld: 143 mg/dL — ABNORMAL HIGH (ref 70–99)
Potassium: 3 mmol/L — ABNORMAL LOW (ref 3.5–5.1)
Sodium: 136 mmol/L (ref 135–145)

## 2023-08-22 LAB — CBC
HCT: 34.9 % — ABNORMAL LOW (ref 39.0–52.0)
Hemoglobin: 11.7 g/dL — ABNORMAL LOW (ref 13.0–17.0)
MCH: 29.5 pg (ref 26.0–34.0)
MCHC: 33.5 g/dL (ref 30.0–36.0)
MCV: 87.9 fL (ref 80.0–100.0)
Platelets: 241 10*3/uL (ref 150–400)
RBC: 3.97 MIL/uL — ABNORMAL LOW (ref 4.22–5.81)
RDW: 12.7 % (ref 11.5–15.5)
WBC: 9.3 10*3/uL (ref 4.0–10.5)
nRBC: 0 % (ref 0.0–0.2)

## 2023-08-22 LAB — GLUCOSE, CAPILLARY
Glucose-Capillary: 121 mg/dL — ABNORMAL HIGH (ref 70–99)
Glucose-Capillary: 168 mg/dL — ABNORMAL HIGH (ref 70–99)
Glucose-Capillary: 190 mg/dL — ABNORMAL HIGH (ref 70–99)
Glucose-Capillary: 151 mg/dL — ABNORMAL HIGH (ref 70–99)

## 2023-08-22 LAB — PHOSPHORUS: Phosphorus: 4.7 mg/dL — ABNORMAL HIGH (ref 2.5–4.6)

## 2023-08-22 LAB — T4, FREE: Free T4: 4.74 ng/dL — ABNORMAL HIGH (ref 0.61–1.12)

## 2023-08-22 LAB — MAGNESIUM: Magnesium: 2.1 mg/dL (ref 1.7–2.4)

## 2023-08-22 MED ORDER — SPIRONOLACTONE 12.5 MG HALF TABLET
12.5000 mg | ORAL_TABLET | Freq: Every day | ORAL | Status: DC
  Filled 2023-08-22: qty 1

## 2023-08-22 MED ORDER — FLUOXETINE HCL 20 MG PO CAPS
40.0000 mg | ORAL_CAPSULE | Freq: Every day | ORAL | Status: DC
  Administered 2023-08-23 – 2023-08-24 (×2): 40 mg via ORAL
  Filled 2023-08-22 (×2): qty 2

## 2023-08-22 MED ORDER — EMPAGLIFLOZIN 10 MG PO TABS
10.0000 mg | ORAL_TABLET | Freq: Every day | ORAL | Status: DC
Start: 2023-08-22 — End: 2023-08-24
  Administered 2023-08-22 – 2023-08-24 (×3): 10 mg via ORAL
  Filled 2023-08-22 (×4): qty 1

## 2023-08-22 MED ORDER — METOPROLOL SUCCINATE ER 25 MG PO TB24
12.5000 mg | ORAL_TABLET | Freq: Every day | ORAL | Status: DC
  Filled 2023-08-22: qty 0.5

## 2023-08-22 MED ORDER — METHIMAZOLE 5 MG PO TABS
15.0000 mg | ORAL_TABLET | Freq: Three times a day (TID) | ORAL | Status: DC
  Administered 2023-08-22 – 2023-08-24 (×7): 15 mg via ORAL
  Filled 2023-08-22 (×8): qty 1

## 2023-08-22 MED ORDER — POTASSIUM CHLORIDE 10 MEQ/50ML IV SOLN
10.0000 meq | INTRAVENOUS | Status: AC
  Administered 2023-08-22 (×4): 10 meq via INTRAVENOUS
  Filled 2023-08-22 (×4): qty 50

## 2023-08-22 MED ORDER — FUROSEMIDE 20 MG PO TABS
40.0000 mg | ORAL_TABLET | Freq: Every day | ORAL | Status: DC
  Administered 2023-08-22: 40 mg via ORAL
  Filled 2023-08-22: qty 2

## 2023-08-22 MED ORDER — POTASSIUM CHLORIDE CRYS ER 20 MEQ PO TBCR
40.0000 meq | EXTENDED_RELEASE_TABLET | Freq: Once | ORAL | Status: AC
  Administered 2023-08-22: 40 meq via ORAL
  Filled 2023-08-22: qty 2

## 2023-08-22 NOTE — Progress Notes (Signed)
Great day. Up in the room all day. Ready to go home. Talked to sister and staff. Positive attitude. Gave himself CHG wash off. States he is soooo bored!!!

## 2023-08-22 NOTE — Plan of Care (Signed)
  Problem: Education: Goal: Ability to describe self-care measures that may prevent or decrease complications (Diabetes Survival Skills Education) will improve Outcome: Progressing Goal: Individualized Educational Video(s) Outcome: Progressing   Problem: Coping: Goal: Ability to adjust to condition or change in health will improve Outcome: Progressing   Problem: Fluid Volume: Goal: Ability to maintain a balanced intake and output will improve Outcome: Progressing   Problem: Metabolic: Goal: Ability to maintain appropriate glucose levels will improve Outcome: Progressing   Problem: Nutritional: Goal: Maintenance of adequate nutrition will improve Outcome: Progressing   Problem: Activity: Goal: Risk for activity intolerance will decrease Outcome: Progressing   Problem: Nutrition: Goal: Adequate nutrition will be maintained Outcome: Progressing   Problem: Coping: Goal: Level of anxiety will decrease Outcome: Progressing

## 2023-08-22 NOTE — Progress Notes (Signed)
[facility] Progress Note  Patient: William [name].    WJX:[id number]  DOA: 08/20/2023     Date of Service: the patient was seen and examined on 08/22/2023  Chief Complaint  Patient presents with   Leg Swelling   Brief hospital course: Daelin Maire. is a 67 y.o. male with medical history significant for Dilated cardiomyopathy EF <25%, followed by the CHF clinic, A-fib s/p cardioversion, DM, mild aortic stenosis,known LBBB, history of gastric bypass, being admitted with a CHF exacerbation.  Patient presented with a 1 week history of worsening edema with started in his feet, no progressing up to the scrotum.  He also has orthopnea and dyspnea on exertion.   He states that he feels like he was doing well with his CHF until he developed a cold with cough, sore throat and sinus congestion about a week ago.  He saw his PCP who prescribed Amoxil, Atrovent nasal spray and Tessalon Perles which he has been using but without much help.  On awaking on the day of arrival he saw that the swelling had gone up to his scrotum.  He took 120 mg of his Lasix (usual dose 40 mg) and presented to the ED ED course and data review: BP 92/67 with otherwise normal vitals. Workup notable for the following: Troponin 46 and BNP 1995 EKG, personally viewed and interpreted showing NSR at 82 with known LBBB Chest x-ray showing suspected trace left pleural effusion   Patient treated with IV Lasix Hospitalist consulted for admission for CHF exacerbation not responding to outpatient management.   Assessment and Plan:  A-fib/atrial flutter with RVR, hypotensive H/o PAF s/p cardioversion (paroxysmal atrial fibrillation)  Continue apixaban S/p IV Digoxin  12/11 s/p amiodarone IV bolus followed by amiodarone IV infusion d/c'd on 12/12  12/11 persistent hypotension, started Levophed via peripheral line 12/13 off of Levophed this am Cardiology following  Acute on chronic systolic CHF (congestive heart  failure) Dilated cardiomyopathy Hypotension BP 92/67 on arrival, possibly related to increased home Lasix dosing S/p Midodrine for BP support in order to continue GDMT S/p Lasix infusion due to blood pressure Held Coreg, Entresto and spironolactone due to hypotension Daily weights with intake and output monitoring BNP 1995 elevated, Trop mildly elevated, demand ischemia D-dimer elevated, CTA negative for PE TTE LVEF 20 to 25%, global hypokinesis, moderately dilated.  LA moderately dilated.  Moderate AS.  Cardiology consult and heart failure team consulted 12/13 DC'd Lasix infusion, started Lasix 40 mg p.o. daily, Started Jardiance 10 mg p.o. daily Start Toprol-XL 12.5 mg p.o. daily, Aldactone 12.5 mg p.o. daily from tomorrow a.m. 12/14   Hyperthyroidism, new diagnosis Patient presented with A-fib with RVR Free T4 level 4.65 elevated TSH 0.01 very low Free T4 level 4.65---4.8--4.74 12/11 started methimazole 20 mg p.o. daily,  12/13 increased methimazole 15 mg p.o. 3 times daily Gradually decrease the dose for maintenance once free T4 improves US thyroid:Moderately heterogeneous, mildly enlarged thyroid. No nodules requiring follow-up or biopsy. Follow thyroid antibodies Check free T4 level daily  Upper respiratory tract infection Patient has been coughing for the past week with sinus drainage No improvement with Amoxil, Atrovent nasal spray and Tessalon Perles Tussionex, Flonase      History of gastric bypass No acute issues Patient currently on Mounjaro   Type 2 diabetes mellitus without complication  Sliding scale insulin coverage     Body mass index is 34.71 kg/m.  Interventions:  Diet: Heart healthy/carb modified diet DVT Prophylaxis: Therapeutic Anticoagulation  with Eliquis    Advance goals of care discussion: Full code  Family Communication: family was not present at bedside, at the time of interview.  The pt provided permission to discuss medical plan with  the family. Opportunity was given to ask question and all questions were answered satisfactorily.   Disposition:  Pt is from home, admitted with A-fib with RVR, CHF exacerbation, found to have hyperthyroidism, still critically ill, which precludes a safe discharge. Discharge to home, when stable, and cleared by cardiology.  May need 1-2 more days to improve.  Subjective: No significant events overnight, patient feels improvement in the shortness of breath and significant improvement in the lower extremity IMA.  Occasional cough.  Denied any other complaints.  No chest pain or palpitations.   Physical Exam: General: NAD, lying comfortably Appear in no distress, affect appropriate Eyes: PERRLA ENT: Oral Mucosa Clear, moist  Neck: no JVD,  Cardiovascular: Irregular rhythm, no Murmur,  Respiratory: good respiratory effort, Bilateral Air entry equal and Decreased, no Crackles, no wheezes Abdomen: Bowel Sound present, Soft and no tenderness,  Skin: no rashes Extremities: 2+ pedal edema, no calf tenderness, improving Neurologic: without any new focal findings Gait not checked due to patient safety concerns  Vitals:   08/22/23 1200 08/22/23 1209 08/22/23 1300 08/22/23 1400  BP: (!) 79/69 (!) 97/54 (!) 89/58   Pulse:      Resp: 18  18 17   Temp: 98.4 F (36.9 C)     TempSrc:      SpO2: 95%     Weight:      Height:        Intake/Output Summary (Last 24 hours) at 08/22/2023 1646 Last data filed at 08/22/2023 1334 Gross per 24 hour  Intake 325.6 ml  Output 1175 ml  Net -849.4 ml   Filed Weights   08/19/23 2229 08/21/23 0500 08/22/23 0500  Weight: 113.4 kg 114 kg 112.9 kg    Data Reviewed: I have personally reviewed and interpreted daily labs, tele strips, imagings as discussed above. I reviewed all nursing notes, pharmacy notes, vitals, pertinent old records I have discussed plan of care as described above with RN and patient/family.  CBC: Recent Labs  Lab 08/19/23 2232  08/21/23 0550 08/22/23 0319  WBC 10.3 9.4 9.3  HGB 12.7* 12.8* 11.7*  HCT 38.3* 37.8* 34.9*  MCV 89.9 88.1 87.9  PLT 281 260 241   Basic Metabolic Panel: Recent Labs  Lab 08/19/23 2232 08/20/23 0732 08/20/23 2011 08/21/23 0550 08/21/23 1402 08/22/23 0319  NA 134* 137 137 137  --  136  K 4.6 4.1 3.5 3.1* 4.4 3.0*  CL 104 103 103 99  --  100  CO2 20* 22 27 26   --  27  GLUCOSE 114* 127* 177* 138*  --  143*  BUN 24* 23 23 20   --  20  CREATININE 0.95 1.04 0.87 0.80  --  0.84  CALCIUM 8.4* 8.0* 8.3* 8.1*  --  8.3*  MG  --  1.9 2.0 1.9 2.1 2.1  PHOS  --  4.9* 4.9* 4.8* 4.2 4.7*    Studies: No results found.   Scheduled Meds:  apixaban  5 mg Oral BID   Chlorhexidine Gluconate Cloth  6 each Topical Q0600   chlorpheniramine-HYDROcodone  5 mL Oral Q12H   empagliflozin  10 mg Oral Daily   [START ON 08/23/2023] FLUoxetine  40 mg Oral Daily   fluticasone  1 spray Each Nare Daily   furosemide  40 mg  Oral Daily   insulin aspart  0-15 Units Subcutaneous TID WC   insulin aspart  0-5 Units Subcutaneous QHS   methIMAzole  15 mg Oral TID   [START ON 08/23/2023] metoprolol succinate  12.5 mg Oral Daily   mupirocin ointment  1 Application Nasal BID   pravastatin  40 mg Oral QHS   predniSONE  40 mg Oral Q breakfast   sodium chloride flush  10-40 mL Intracatheter Q12H   [START ON 08/23/2023] spironolactone  12.5 mg Oral Daily   Continuous Infusions:   PRN Meds: acetaminophen **OR** acetaminophen, guaiFENesin, menthol-cetylpyridinium, ondansetron **OR** ondansetron (ZOFRAN) IV, sodium chloride flush, traZODone  Time spent: 55 minutes  Author: Gillis Santa. MD [facility] 08/22/2023 4:46 PM  To reach On-call, see care teams to locate the attending and reach out to them via [email or web address]. If 7PM-7AM, please contact night-coverage If you still have difficulty reaching the attending provider, please page the [facility] (Director on Call) for [facility] on amion for  assistance.

## 2023-08-22 NOTE — Progress Notes (Signed)
    Advanced Heart Failure Rounding Note  PCP-Cardiologist: Julien Nordmann, MD   Subjective:     Remains in sinus rhythm, he is feeling better, swelling significantly improved. Off of norepi this morning.     Objective:   Weight Range: 112.9 kg Body mass index is 34.71 kg/m.   Vital Signs:   Temp:  [97.9 F (36.6 C)-98.2 F (36.8 C)] 98.2 F (36.8 C) (12/13 0800) Pulse Rate:  [29-112] 85 (12/13 0900) Resp:  [13-29] 17 (12/13 1000) BP: (81-119)/(54-81) 91/61 (12/13 1000) SpO2:  [88 %-100 %] 92 % (12/13 0900) Weight:  [112.9 kg] 112.9 kg (12/13 0500) Last BM Date : 08/21/23  Weight change: Filed Weights   08/19/23 2229 08/21/23 0500 08/22/23 0500  Weight: 113.4 kg 114 kg 112.9 kg    Intake/Output:   Intake/Output Summary (Last 24 hours) at 08/22/2023 1021 Last data filed at 08/22/2023 0634 Gross per 24 hour  Intake 618.87 ml  Output 1350 ml  Net -731.13 ml      Physical Exam    General:  Well appearing. No resp difficulty HEENT: NCAT Cor:  Regular rate & rhythm. No rubs, gallops or murmurs. JVP not elevated. Trace LE edema Lungs: Normal work of breathing, clear to auscultation bilaterally Abdomen: soft, nontender, nondistended Extremities: no cyanosis, clubbing, rash Neuro: alert & orientedx3, cranial nerves grossly intact. moves all 4 extremities w/o difficulty. Affect pleasant Vascular: 2+ radial pulses    Telemetry   Sinus rhythm in the 80s.   Patient Profile   William [name]. is a 67 y.o. male with a PMH of atrial fibrillation, HFmrEF, HTN, GERD who presented with acute on chronic systolic heart failure exacerbation and atrial fibrillation with RVR in the setting of type II amiodarone induced thyrotoxicosis.   Assessment/Plan   Type II AIT: Previously normal thyroid labs as recently as 04/2023.  TSH undetectable with elevated free T4 on arrival.  No history of thyroid disease, thyroid ultrasound normal.  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  Continue oral prednisone as well as methimazole and have stopped amiodarone. -Thyroid ultrasound reviewed and unremarkable -Autoimmune thyroid labs pending -Methimazole 15 mg 3 times daily, would continue until free T4 normalizes -Continue prednisone 40 mg daily   Acute on chronic systolic heart failure: Patient presented with worsening swelling, responsive to Lasix drip.  Low blood pressures, but not far off his baseline of low 100s seen in clinic.  Improved volume status, will stop drip and restart home diuretics tomorrow. Slowly reintroduce GDMT. -Off norepi -Restart jardiance 10mg  daily - Spironolactone tomorrow and likely low dose BB as well - Restart home lasix 40mg  daily - Stop lasix gtt   Atrial fibrillation: Likely induced by thyrotoxicosis, spontaneously converted after brief time on IV amiodarone and 1 dose of digoxin.  Reports missing 2 doses of Eliquis. Will discharge off amiodarone, can consider tikosyn in the future if recurrent.  -Resume Eliquis -Cancel TEE/DCCV   Length of Stay: 2  [name], MD  08/22/2023, 10:21 AM  Advanced Heart Failure Team Pager [phone] (M-F; 7a - 5p)  Please contact [facility] for night-coverage after hours (5p -7a ) and weekends on [email or web address]

## 2023-08-22 NOTE — Plan of Care (Signed)
  Problem: Education: Goal: Ability to describe self-care measures that may prevent or decrease complications (Diabetes Survival Skills Education) will improve Outcome: Progressing Goal: Individualized Educational Video(s) Outcome: Progressing   Problem: Coping: Goal: Ability to adjust to condition or change in health will improve Outcome: Progressing   Problem: Fluid Volume: Goal: Ability to maintain a balanced intake and output will improve  Problem: Health Behavior/Discharge Planning: Goal: Ability to identify and utilize available resources and services will improve Outcome: Progressing Goal: Ability to manage health-related needs will improve Outcome: Progressing   Problem: Metabolic: Goal: Ability to maintain appropriate glucose levels will improve Outcome: Progressing   Problem: Nutritional: Goal: Maintenance of adequate nutrition will improve Outcome: Progressing Goal: Progress toward achieving an optimal weight will improve Outcome: Progressing   Problem: Skin Integrity: Goal: Risk for impaired skin integrity will decrease Outcome: Progressing   Problem: Tissue Perfusion: Goal: Adequacy of tissue perfusion will improve Outcome: Progressing   Problem: Education: Goal: Knowledge of General Education information will improve Description: Including pain rating scale, medication(s)/side effects and non-pharmacologic comfort measures Outcome: Progressing   Problem: Health Behavior/Discharge Planning: Goal: Ability to manage health-related needs will improve Outcome: Progressing   Problem: Clinical Measurements: Goal: Ability to maintain clinical measurements within normal limits will improve Outcome: Progressing Goal: Will remain free from infection Outcome: Progressing Goal: Diagnostic test results will improve Outcome: Progressing Goal: Respiratory complications will improve Outcome: Progressing Goal: Cardiovascular complication will be avoided Outcome:  Progressing   Problem: Activity: Goal: Risk for activity intolerance will decrease Outcome: Progressing   Problem: Nutrition: Goal: Adequate nutrition will be maintained Outcome: Progressing   Problem: Coping: Goal: Level of anxiety will decrease Outcome: Progressing   Problem: Elimination: Goal: Will not experience complications related to bowel motility Outcome: Progressing Goal: Will not experience complications related to urinary retention Outcome: Progressing   Problem: Pain Management: Goal: General experience of comfort will improve Outcome: Progressing   Problem: Safety: Goal: Ability to remain free from injury will improve Outcome: Progressing   Problem: Skin Integrity: Goal: Risk for impaired skin integrity will decrease Outcome: Progressing   Problem: Education: Goal: Ability to demonstrate management of disease process will improve Outcome: Progressing Goal: Ability to verbalize understanding of medication therapies will improve Outcome: Progressing Goal: Individualized Educational Video(s) Outcome: Progressing   Problem: Activity: Goal: Capacity to carry out activities will improve Outcome: Progressing   Problem: Cardiac: Goal: Ability to achieve and maintain adequate cardiopulmonary perfusion will improve Outcome: Progressing

## 2023-08-23 DIAGNOSIS — I4891 Unspecified atrial fibrillation: Secondary | ICD-10-CM | POA: Diagnosis not present

## 2023-08-23 DIAGNOSIS — I5043 Acute on chronic combined systolic (congestive) and diastolic (congestive) heart failure: Secondary | ICD-10-CM | POA: Diagnosis not present

## 2023-08-23 LAB — GLUCOSE, CAPILLARY
Glucose-Capillary: 127 mg/dL — ABNORMAL HIGH (ref 70–99)
Glucose-Capillary: 191 mg/dL — ABNORMAL HIGH (ref 70–99)
Glucose-Capillary: 171 mg/dL — ABNORMAL HIGH (ref 70–99)

## 2023-08-23 LAB — CBC
HCT: 34.4 % — ABNORMAL LOW (ref 39.0–52.0)
Hemoglobin: 11.6 g/dL — ABNORMAL LOW (ref 13.0–17.0)
MCH: 29.7 pg (ref 26.0–34.0)
MCHC: 33.7 g/dL (ref 30.0–36.0)
MCV: 88 fL (ref 80.0–100.0)
Platelets: 226 10*3/uL (ref 150–400)
RBC: 3.91 MIL/uL — ABNORMAL LOW (ref 4.22–5.81)
RDW: 12.8 % (ref 11.5–15.5)
WBC: 9.3 10*3/uL (ref 4.0–10.5)
nRBC: 0 % (ref 0.0–0.2)

## 2023-08-23 LAB — BASIC METABOLIC PANEL
Anion gap: 9 (ref 5–15)
BUN: 23 mg/dL (ref 8–23)
CO2: 27 mmol/L (ref 22–32)
Calcium: 8.5 mg/dL — ABNORMAL LOW (ref 8.9–10.3)
Chloride: 101 mmol/L (ref 98–111)
Creatinine, Ser: 0.75 mg/dL (ref 0.61–1.24)
GFR, Estimated: >60 mL/min (ref >60)
Glucose, Bld: 129 mg/dL — ABNORMAL HIGH (ref 70–99)
Potassium: 3.6 mmol/L (ref 3.5–5.1)
Sodium: 137 mmol/L (ref 135–145)

## 2023-08-23 LAB — T3, FREE: T3, Free: 7.5 pg/mL — ABNORMAL HIGH (ref 2.0–4.4)

## 2023-08-23 LAB — THYROID PEROXIDASE ANTIBODY: Thyroperoxidase Ab SerPl-aCnc: <9 [IU]/mL (ref 0–34)

## 2023-08-23 LAB — MAGNESIUM: Magnesium: 2.4 mg/dL (ref 1.7–2.4)

## 2023-08-23 LAB — T4, FREE: Free T4: 4.75 ng/dL — ABNORMAL HIGH (ref 0.61–1.12)

## 2023-08-23 LAB — PHOSPHORUS: Phosphorus: 4.8 mg/dL — ABNORMAL HIGH (ref 2.5–4.6)

## 2023-08-23 MED ORDER — METOPROLOL TARTRATE 25 MG PO TABS
25.0000 mg | ORAL_TABLET | Freq: Two times a day (BID) | ORAL | Status: DC
  Administered 2023-08-23 (×2): 25 mg via ORAL
  Filled 2023-08-23 (×2): qty 1

## 2023-08-23 MED ORDER — FUROSEMIDE 20 MG PO TABS
20.0000 mg | ORAL_TABLET | Freq: Every day | ORAL | Status: DC
  Administered 2023-08-23 – 2023-08-24 (×2): 20 mg via ORAL
  Filled 2023-08-23 (×2): qty 1

## 2023-08-23 MED ORDER — CHLORHEXIDINE GLUCONATE CLOTH 2 % EX PADS
6.0000 | MEDICATED_PAD | Freq: Every day | CUTANEOUS | Status: DC
  Administered 2023-08-24: 6 via TOPICAL

## 2023-08-23 NOTE — Plan of Care (Signed)

## 2023-08-23 NOTE — Progress Notes (Signed)
Progress Note    William [name].  ZOX:[id number] DOB: 26-Oct-1955  DOA: 08/20/2023 PCP: Lynnea Ferrier, MD      Brief Narrative:    Medical records reviewed and are as summarized below:  William [name]. is a 67 y.o. male with medical history significant for Dilated cardiomyopathy EF <25%, followed by the CHF clinic, A-fib s/p cardioversion, DM, mild aortic stenosis,known LBBB, history of gastric bypass, who presented to the hospital with shortness of breath, orthopnea and worsening lower extremity edema of about 1 week duration.  He was admitted to the hospital for CHF exacerbation.       Assessment/Plan:   Principal Problem:   Acute on chronic combined systolic and diastolic CHF (congestive heart failure) (HCC) Active Problems:   Dilated cardiomyopathy (HCC)   Upper respiratory tract infection   PAF s/p cardioversion (paroxysmal atrial fibrillation) (HCC)   Aortic stenosis, mild   Type 2 diabetes mellitus without complication (HCC)   History of gastric bypass   Acute exacerbation of CHF (congestive heart failure) (HCC)   Scrotal edema    Body mass index is 35.24 kg/m.   Acute on chronic CHF, dilated cardiomyopathy, hypotension: S/P treatment with IV Lasix infusion, IV Levophed.  S/p treatment with midodrine. Carvedilol, Entresto spironolactone were held because of hypotension. BNP 1995.  Elevated troponin attributed to demand ischemia. Continue oral Lasix, metoprolol and Jardiance. CTA negative for pulmonary embolism. 2D echo showed EF estimated at 20 to 25%, global hypokinesis, moderately dilated. LA moderately dilated. Moderate AS.    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   Acute URI: Antitussives as needed.   Comorbidities include type II DM, history of gastric bypass   Diet Order             Diet heart  healthy/carb modified Room service appropriate? Yes; Fluid consistency: Thin  Diet effective now                            Consultants: Cardiologist  Procedures: None    Medications:    apixaban  5 mg Oral BID   Chlorhexidine Gluconate Cloth  6 each Topical Q0600   chlorpheniramine-HYDROcodone  5 mL Oral Q12H   empagliflozin  10 mg Oral Daily   FLUoxetine  40 mg Oral Daily   fluticasone  1 spray Each Nare Daily   furosemide  20 mg Oral Daily   insulin aspart  0-15 Units Subcutaneous TID WC   insulin aspart  0-5 Units Subcutaneous QHS   methIMAzole  15 mg Oral TID   metoprolol tartrate  25 mg Oral BID   mupirocin ointment  1 Application Nasal BID   pravastatin  40 mg Oral QHS   predniSONE  40 mg Oral Q breakfast   sodium chloride flush  10-40 mL Intracatheter Q12H   Continuous Infusions:   Anti-infectives (From admission, onward)    None              Family Communication/Anticipated D/C date and plan/Code Status   DVT prophylaxis:  apixaban (ELIQUIS) tablet 5 mg     Code Status: Full Code  Family Communication: None Disposition Plan: Plan to discharge home   Status is: Inpatient Remains inpatient appropriate because: CHF exacerbation, A-fib with RVR       Subjective:   Interval events noted.  He feels better.  No palpitations, chest pain or shortness of breath.  Objective:    Vitals:   08/23/23 0500 08/23/23 0610 08/23/23 0700 08/23/23 0800  BP: (!) 86/65 (!) 87/64 (!) 88/63 92/63  Pulse: (!) 114 (!) 111 (!) 107   Resp: (!) 24 14 (!) 21 15  Temp:      TempSrc:      SpO2: 100% 95% 92%   Weight:      Height:       No data found.   Intake/Output Summary (Last 24 hours) at 08/23/2023 0933 Last data filed at 08/22/2023 2300 Gross per 24 hour  Intake 805.62 ml  Output 1475 ml  Net -669.38 ml   Filed Weights   08/21/23 0500 08/22/23 0500 08/23/23 0400  Weight: 114 kg 112.9 kg 114.6 kg    Exam:  GEN: NAD SKIN:  Warm and dry EYES: No pallor or icterus ENT: MMM CV: Irregular rate and rhythm, tachycardic PULM: CTA B ABD: soft, obese, NT, +BS CNS: AAO x 3, non focal EXT: B/l leg edema, no tenderness        Data Reviewed:   I have personally reviewed following labs and imaging studies:  Labs: Labs show the following:   Basic Metabolic Panel: Recent Labs  Lab 08/20/23 0732 08/20/23 2011 08/21/23 0550 08/21/23 1402 08/22/23 0319 08/23/23 0401  NA 137 137 137  --  136 137  K 4.1 3.5 3.1* 4.4 3.0* 3.6  CL 103 103 99  --  100 101  CO2 22 27 26   --  27 27  GLUCOSE 127* 177* 138*  --  143* 129*  BUN 23 23 20   --  20 23  CREATININE 1.04 0.87 0.80  --  0.84 0.75  CALCIUM 8.0* 8.3* 8.1*  --  8.3* 8.5*  MG 1.9 2.0 1.9 2.1 2.1 2.4  PHOS 4.9* 4.9* 4.8* 4.2 4.7* 4.8*   GFR Estimated Creatinine Clearance: 116.9 mL/min (by C-G formula based on SCr of 0.75 mg/dL). Liver Function Tests: No results for input(s): "AST", "ALT", "ALKPHOS", "BILITOT", "PROT", "ALBUMIN" in the last 168 hours. No results for input(s): "LIPASE", "AMYLASE" in the last 168 hours. No results for input(s): "AMMONIA" in the last 168 hours. Coagulation profile Recent Labs  Lab 08/20/23 1801  INR 1.2    CBC: Recent Labs  Lab 08/19/23 2232 08/21/23 0550 08/22/23 0319 08/23/23 0401  WBC 10.3 9.4 9.3 9.3  HGB 12.7* 12.8* 11.7* 11.6*  HCT 38.3* 37.8* 34.9* 34.4*  MCV 89.9 88.1 87.9 88.0  PLT 281 260 241 226   Cardiac Enzymes: No results for input(s): "CKTOTAL", "CKMB", "CKMBINDEX", "TROPONINI" in the last 168 hours. BNP (last 3 results) No results for input(s): "PROBNP" in the last 8760 hours. CBG: Recent Labs  Lab 08/22/23 0749 08/22/23 1125 08/22/23 1615 08/22/23 2106 08/23/23 0751  GLUCAP 121* 168* 190* 151* 127*   D-Dimer: No results for input(s): "DDIMER" in the last 72 hours. Hgb A1c: No results for input(s): "HGBA1C" in the last 72 hours. Lipid Profile: No results for input(s): "CHOL",  "HDL", "LDLCALC", "TRIG", "CHOLHDL", "LDLDIRECT" in the last 72 hours. Thyroid function studies: Recent Labs    08/21/23 0930  T3FREE 7.5*   Anemia work up: No results for input(s): "VITAMINB12", "FOLATE", "FERRITIN", "TIBC", "IRON", "RETICCTPCT" in the last 72 hours. Sepsis Labs: Recent Labs  Lab 08/19/23 2232 08/20/23 1723 08/21/23 0550 08/22/23 0319 08/23/23 0401  WBC 10.3  --  9.4 9.3 9.3  LATICACIDVEN  --  1.1  --   --   --  Microbiology Recent Results (from the past 240 hours)  Resp panel by RT-PCR (RSV, Flu A&B, Covid) Anterior Nasal Swab     Status: None   Collection Time: 08/20/23  1:58 AM   Specimen: Anterior Nasal Swab  Result Value Ref Range Status   SARS Coronavirus 2 by RT PCR NEGATIVE NEGATIVE Final    Comment: (NOTE) SARS-CoV-2 target nucleic acids are NOT DETECTED.  The SARS-CoV-2 RNA is generally detectable in upper respiratory specimens during the acute phase of infection. The lowest concentration of SARS-CoV-2 viral copies this assay can detect is 138 copies/mL. A negative result does not preclude SARS-Cov-2 infection and should not be used as the sole basis for treatment or other patient management decisions. A negative result may occur with  improper specimen collection/handling, submission of specimen other than nasopharyngeal swab, presence of viral mutation(s) within the areas targeted by this assay, and inadequate number of viral copies(<138 copies/mL). A negative result must be combined with clinical observations, patient history, and epidemiological information. The expected result is Negative.  Fact Sheet for Patients:  BloggerCourse.com  Fact Sheet for Healthcare Providers:  SeriousBroker.it  This test is no t yet approved or cleared by the [place] FDA and  has been authorized for detection and/or diagnosis of SARS-CoV-2 by FDA under an Emergency Use Authorization (EUA). This  EUA will remain  in effect (meaning this test can be used) for the duration of the COVID-19 declaration under Section 564(b)(1) of the Act, 21 U.S.C.section 360bbb-3(b)(1), unless the authorization is terminated  or revoked sooner.       Influenza A by PCR NEGATIVE NEGATIVE Final   Influenza B by PCR NEGATIVE NEGATIVE Final    Comment: (NOTE) The Xpert Xpress SARS-CoV-2/FLU/RSV plus assay is intended as an aid in the diagnosis of influenza from Nasopharyngeal swab specimens and should not be used as a sole basis for treatment. Nasal washings and aspirates are unacceptable for Xpert Xpress SARS-CoV-2/FLU/RSV testing.  Fact Sheet for Patients: BloggerCourse.com  Fact Sheet for Healthcare Providers: SeriousBroker.it  This test is not yet approved or cleared by the [place] FDA and has been authorized for detection and/or diagnosis of SARS-CoV-2 by FDA under an Emergency Use Authorization (EUA). This EUA will remain in effect (meaning this test can be used) for the duration of the COVID-19 declaration under Section 564(b)(1) of the Act, 21 U.S.C. section 360bbb-3(b)(1), unless the authorization is terminated or revoked.     Resp Syncytial Virus by PCR NEGATIVE NEGATIVE Final    Comment: (NOTE) Fact Sheet for Patients: BloggerCourse.com  Fact Sheet for Healthcare Providers: SeriousBroker.it  This test is not yet approved or cleared by the [place] FDA and has been authorized for detection and/or diagnosis of SARS-CoV-2 by FDA under an Emergency Use Authorization (EUA). This EUA will remain in effect (meaning this test can be used) for the duration of the COVID-19 declaration under Section 564(b)(1) of the Act, 21 U.S.C. section 360bbb-3(b)(1), unless the authorization is terminated or revoked.  Performed at [facility], [address] [place], [place]  [address]   Respiratory (~20 pathogens) panel by PCR     Status: None   Collection Time: 08/20/23  6:54 PM   Specimen: Nasopharyngeal Swab; Respiratory  Result Value Ref Range Status   Adenovirus NOT DETECTED NOT DETECTED Final   Coronavirus 229E NOT DETECTED NOT DETECTED Final    Comment: (NOTE) The Coronavirus on the Respiratory Panel, DOES NOT test for the novel  Coronavirus (2019 nCoV)  Coronavirus HKU1 NOT DETECTED NOT DETECTED Final   Coronavirus NL63 NOT DETECTED NOT DETECTED Final   Coronavirus OC43 NOT DETECTED NOT DETECTED Final   Metapneumovirus NOT DETECTED NOT DETECTED Final   Rhinovirus / Enterovirus NOT DETECTED NOT DETECTED Final   Influenza A NOT DETECTED NOT DETECTED Final   Influenza B NOT DETECTED NOT DETECTED Final   Parainfluenza Virus 1 NOT DETECTED NOT DETECTED Final   Parainfluenza Virus 2 NOT DETECTED NOT DETECTED Final   Parainfluenza Virus 3 NOT DETECTED NOT DETECTED Final   Parainfluenza Virus 4 NOT DETECTED NOT DETECTED Final   Respiratory Syncytial Virus NOT DETECTED NOT DETECTED Final   Bordetella pertussis NOT DETECTED NOT DETECTED Final   Bordetella Parapertussis NOT DETECTED NOT DETECTED Final   Chlamydophila pneumoniae NOT DETECTED NOT DETECTED Final   Mycoplasma pneumoniae NOT DETECTED NOT DETECTED Final    Comment: Performed at [facility], [address], [place], [place] [address]    Procedures and diagnostic studies:  US THYROID Result Date: 08/21/2023 CLINICAL DATA:  Hyperthyroidism EXAM: THYROID ULTRASOUND TECHNIQUE: Ultrasound examination of the thyroid gland and adjacent soft tissues was performed. COMPARISON:  None available. FINDINGS: Parenchymal Echotexture: Mildly heterogenous Isthmus: 0.4 cm Right lobe: 6.0 x 2.2 x 2.5 cm Left lobe: 5.2 x 2.2 x 1.9 cm _________________________________________________________ Estimated total number of nodules >/= 1 cm: 0 Number of spongiform nodules >/=  2 cm not described below (TR1): 0 Number  of mixed cystic and solid nodules >/= 1.5 cm not described below (TR2): 0 _________________________________________________________ Nodule 1: 0.7 x 0.6 x 0.4 cm solid hypoechoic isthmus nodule does not meet criteria for imaging surveillance or FNA. IMPRESSION: 1. Moderately heterogeneous, mildly enlarged thyroid. 2. No nodules requiring follow-up or biopsy. The above is in keeping with the ACR TI-RADS recommendations - J Am Coll Radiol 2017;14:587-595. Electronically Signed   By: Acquanetta Belling M.D.   On: 08/21/2023 12:14               LOS: 3 days   Kemiah Booz  [facility]   Pager on [email or web address]. If 7PM-7AM, please contact night-coverage at [email or web address]     08/23/2023, 9:33 AM

## 2023-08-23 NOTE — Progress Notes (Signed)
Rounding Note    Patient Name: William [name]. Date of Encounter: 08/23/2023  [facility] Cardiologist: Julien Nordmann, MD   Subjective   Denies chest pain, shortness of breath, palpitations.  States feeling okay.  Edema is adequately controlled since starting Lasix.  Inpatient Medications    Scheduled Meds:  apixaban  5 mg Oral BID   Chlorhexidine Gluconate Cloth  6 each Topical Q0600   chlorpheniramine-HYDROcodone  5 mL Oral Q12H   empagliflozin  10 mg Oral Daily   FLUoxetine  40 mg Oral Daily   fluticasone  1 spray Each Nare Daily   furosemide  20 mg Oral Daily   insulin aspart  0-15 Units Subcutaneous TID WC   insulin aspart  0-5 Units Subcutaneous QHS   methIMAzole  15 mg Oral TID   metoprolol tartrate  25 mg Oral BID   mupirocin ointment  1 Application Nasal BID   pravastatin  40 mg Oral QHS   predniSONE  40 mg Oral Q breakfast   sodium chloride flush  10-40 mL Intracatheter Q12H   Continuous Infusions:  PRN Meds: acetaminophen **OR** acetaminophen, guaiFENesin, menthol-cetylpyridinium, ondansetron **OR** ondansetron (ZOFRAN) IV, sodium chloride flush, traZODone   Vital Signs    Vitals:   08/23/23 0800 08/23/23 0900 08/23/23 1000 08/23/23 1014  BP: 92/63 (!) 91/58 95/68 95/68   Pulse:    (!) 104  Resp: 15 (!) 24 (!) 24 18  Temp:      TempSrc:      SpO2:   95%   Weight:      Height:        Intake/Output Summary (Last 24 hours) at 08/23/2023 1223 Last data filed at 08/23/2023 1018 Gross per 24 hour  Intake 805.62 ml  Output 975 ml  Net -169.38 ml      08/23/2023    4:00 AM 08/22/2023    5:00 AM 08/21/2023    5:00 AM  Last 3 Weights  Weight (lbs) 252 lb 10.4 oz 248 lb 14.4 oz 251 lb 5.2 oz  Weight (kg) 114.6 kg 112.9 kg 114 kg      Telemetry    Atrial fibrillation, heart rate 110- Personally Reviewed  ECG     - Personally Reviewed  Physical Exam   GEN: No acute distress.   Neck: No JVD Cardiac: Irregular  irregular Respiratory: Clear to auscultation bilaterally. GI: Soft, nontender, non-distended  MS: No edema; No deformity. Neuro:  Nonfocal  Psych: Normal affect   Labs    High Sensitivity Troponin:   Recent Labs  Lab 08/19/23 2232 08/20/23 0050  TROPONINIHS 56* 46*     Chemistry Recent Labs  Lab 08/21/23 0550 08/21/23 1402 08/22/23 0319 08/23/23 0401  NA 137  --  136 137  K 3.1* 4.4 3.0* 3.6  CL 99  --  100 101  CO2 26  --  27 27  GLUCOSE 138*  --  143* 129*  BUN 20  --  20 23  CREATININE 0.80  --  0.84 0.75  CALCIUM 8.1*  --  8.3* 8.5*  MG 1.9 2.1 2.1 2.4  GFRNONAA >60  --  >60 >60  ANIONGAP 12  --  9 9    Lipids No results for input(s): "CHOL", "TRIG", "HDL", "LABVLDL", "LDLCALC", "CHOLHDL" in the last 168 hours.  Hematology Recent Labs  Lab 08/21/23 0550 08/22/23 0319 08/23/23 0401  WBC 9.4 9.3 9.3  RBC 4.29 3.97* 3.91*  HGB 12.8* 11.7* 11.6*  HCT 37.8* 34.9*  34.4*  MCV 88.1 87.9 88.0  MCH 29.8 29.5 29.7  MCHC 33.9 33.5 33.7  RDW 13.2 12.7 12.8  PLT 260 241 226   Thyroid  Recent Labs  Lab 08/20/23 0732 08/21/23 0550 08/23/23 0401  TSH <0.010*  --   --   FREET4 4.65*   < > 4.75*   < > = values in this interval not displayed.    BNP Recent Labs  Lab 08/19/23 2232  BNP 1,995.5*    DDimer  Recent Labs  Lab 08/20/23 0520  DDIMER 0.65*     Radiology    No results found.  Cardiac Studies   Echo 08/20/2023 EF 20 to 25%  Patient Profile     67 y.o. male with history of HFrEF, atrial fibrillation presenting with shortness of breath being seen for A-fib RVR and CHF exacerbation.  Assessment & Plan    A-fib RVR -In the context of amiodarone induced thyroid dysfunction -Hyperthyroidism being treated with methimazole, free T4 levels today elevated -Stop Toprol-XL, start Lopressor 25 mg twice daily for heart rate control. -Continue Eliquis 5 mg twice daily -Management of thyroid dysfunction will help with A-fib management.  2.   Cardiomyopathy EF 25% -Euvolemic -BP low normal -Reduce Lasix to Lasix 20 mg daily -Continue Jardiance, Lopressor for now.  Switch to Toprol-XL when A-fib is better controlled. -Additional GDMT as BP permits.      Signed, Debbe Odea, MD  08/23/2023, 12:23 PM

## 2023-08-23 NOTE — Plan of Care (Signed)
  Problem: Activity: Goal: Risk for activity intolerance will decrease Outcome: Progressing   Ambulating in room ad lib

## 2023-08-24 ENCOUNTER — Other Ambulatory Visit: Payer: Self-pay | Admitting: Internal Medicine

## 2023-08-24 DIAGNOSIS — I4891 Unspecified atrial fibrillation: Secondary | ICD-10-CM | POA: Diagnosis not present

## 2023-08-24 DIAGNOSIS — I5043 Acute on chronic combined systolic (congestive) and diastolic (congestive) heart failure: Secondary | ICD-10-CM | POA: Diagnosis not present

## 2023-08-24 LAB — BASIC METABOLIC PANEL
Anion gap: 10 (ref 5–15)
BUN: 25 mg/dL — ABNORMAL HIGH (ref 8–23)
CO2: 27 mmol/L (ref 22–32)
Calcium: 8.5 mg/dL — ABNORMAL LOW (ref 8.9–10.3)
Chloride: 99 mmol/L (ref 98–111)
Creatinine, Ser: 0.83 mg/dL (ref 0.61–1.24)
GFR, Estimated: >60 mL/min (ref >60)
Glucose, Bld: 130 mg/dL — ABNORMAL HIGH (ref 70–99)
Potassium: 3.3 mmol/L — ABNORMAL LOW (ref 3.5–5.1)
Sodium: 136 mmol/L (ref 135–145)

## 2023-08-24 LAB — THYROID STIMULATING IMMUNOGLOBULIN: Thyroid Stimulating Immunoglob: <0.1 [IU]/L (ref 0.00–0.55)

## 2023-08-24 LAB — GLUCOSE, CAPILLARY: Glucose-Capillary: 128 mg/dL — ABNORMAL HIGH (ref 70–99)

## 2023-08-24 LAB — T4, FREE: Free T4: 4.42 ng/dL — ABNORMAL HIGH (ref 0.61–1.12)

## 2023-08-24 MED ORDER — METOPROLOL SUCCINATE ER 50 MG PO TB24
50.0000 mg | ORAL_TABLET | Freq: Every day | ORAL | Status: DC
  Administered 2023-08-24: 50 mg via ORAL
  Filled 2023-08-24: qty 1

## 2023-08-24 MED ORDER — PREDNISONE 20 MG PO TABS: 40.0000 mg | ORAL_TABLET | Freq: Every day | ORAL | 0 refills | Status: AC

## 2023-08-24 MED ORDER — METHIMAZOLE 5 MG PO TABS: 15.0000 mg | ORAL_TABLET | Freq: Three times a day (TID) | ORAL | 0 refills | Status: DC

## 2023-08-24 MED ORDER — FUROSEMIDE 40 MG PO TABS: 20.0000 mg | ORAL_TABLET | Freq: Every day | ORAL | Status: DC

## 2023-08-24 MED ORDER — TRAZODONE HCL 50 MG PO TABS: 50.0000 mg | ORAL_TABLET | Freq: Every day | ORAL | 0 refills | Status: DC

## 2023-08-24 MED ORDER — METOPROLOL SUCCINATE ER 50 MG PO TB24: 50.0000 mg | ORAL_TABLET | Freq: Every day | ORAL | 0 refills | Status: DC

## 2023-08-24 MED ORDER — POTASSIUM CHLORIDE CRYS ER 20 MEQ PO TBCR: 20.0000 meq | EXTENDED_RELEASE_TABLET | Freq: Every day | ORAL | 0 refills | Status: DC

## 2023-08-24 NOTE — Discharge Summary (Signed)
Physician Discharge Summary   Patient: William [name]. MRN: [id number] DOB: July 10, 1956  Admit date:     08/20/2023  Discharge date: 08/24/23  Discharge Physician: Lurene Shadow   PCP: Lynnea Ferrier, MD   Recommendations at discharge:   Follow-up with PCP in 1 week Follow-up with [facility] cardiology within 2 weeks of discharge Follow-up at CHF clinic in 1 week  Discharge Diagnoses: Principal Problem:   Acute on chronic combined systolic and diastolic CHF (congestive heart failure) (HCC) Active Problems:   Dilated cardiomyopathy (HCC)   Upper respiratory tract infection   PAF s/p cardioversion (paroxysmal atrial fibrillation) (HCC)   Aortic stenosis, mild   Type 2 diabetes mellitus without complication (HCC)   History of gastric bypass   Acute exacerbation of CHF (congestive heart failure) (HCC)   Scrotal edema   Atrial fibrillation with RVR (HCC)  Resolved Problems:   * No resolved hospital problems. *  Hospital Course:  William Giammanco. is a 67 y.o. male with medical history significant for Dilated cardiomyopathy EF <25%, followed by the CHF clinic, A-fib s/p cardioversion, DM, mild aortic stenosis,known LBBB, history of gastric bypass, who presented to the hospital with shortness of breath, orthopnea and worsening lower extremity edema of about 1 week duration.   He was admitted to the hospital for CHF exacerbation.      Assessment and Plan:  Acute on chronic CHF, dilated cardiomyopathy, hypotension: S/P treatment with IV Lasix infusion, IV Levophed.  S/p treatment with midodrine. Carvedilol, Entresto spironolactone were held because of hypotension. BNP 1,995.  Elevated troponin attributed to demand ischemia. Continue oral Lasix, metoprolol and Jardiance. CTA negative for pulmonary embolism. 2D echo showed EF estimated at 20 to 25%, global hypokinesis, moderately dilated. LA moderately dilated. Moderate AS.      Atrial fibrillation/atrial flutter with RVR,  history of PAF s/p cardioversion: He has converted to normal sinus rhythm.  Continue Eliquis.  Metoprolol tartrate converted to metoprolol succinate.     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 Plan to taper off prednisone in the outpatient setting.    Acute URI: Antitussives as needed.   Mild hypokalemia: Prescription for potassium chloride sent to [facility] pharmacy.      Comorbidities include type II DM, history of gastric bypass   He requested trazodone for sleep at night   His condition has improved and he is deemed stable for discharge to home today.       Consultants: Cardiologist, intensivist Procedures performed: None Disposition: Home Diet recommendation:  Discharge Diet Orders (From admission, onward)     Start     Ordered   08/24/23 0000  Diet - low sodium heart healthy        08/24/23 1141           Cardiac and Carb modified diet DISCHARGE MEDICATION: Allergies as of 08/24/2023       Reactions   Morphine And Codeine Hives   Nsaids Other (See Comments)   Gastric bypass        Medication List     STOP taking these medications    amiodarone 200 MG tablet Commonly known as: PACERONE   benzonatate 200 MG capsule Commonly known as: TESSALON   carvedilol 6.25 MG tablet Commonly known as: COREG   sacubitril-valsartan 24-26 MG Commonly known as: ENTRESTO   sildenafil 100 MG tablet Commonly known as: VIAGRA   spironolactone 25 MG tablet Commonly known as: ALDACTONE       TAKE these medications  amoxicillin 500 MG capsule Commonly known as: AMOXIL Take 2,000 mg by mouth once as needed (Pretreatment for Procedure(s)). What changed: Another medication with the same name was removed. Continue taking this medication, and follow the directions you see here.   B-12 2500 MCG Tabs Take 5,000 mcg by mouth daily.   betamethasone valerate 0.1 % cream Commonly known as: VALISONE Apply 1 Application  topically 2 (two) times daily as needed.   Eliquis 5 MG Tabs tablet Generic drug: apixaban TAKE 1 TABLET BY MOUTH TWICE  DAILY   FLUoxetine 40 MG capsule Commonly known as: PROZAC Take 1 capsule by mouth daily.   furosemide 40 MG tablet Commonly known as: LASIX Take 0.5 tablets (20 mg total) by mouth daily. What changed: how much to take   ipratropium 0.03 % nasal spray Commonly known as: ATROVENT Place 2 sprays into the nose 2 (two) times daily.   Jardiance 25 MG Tabs tablet Generic drug: empagliflozin Take 25 mg by mouth daily.   metFORMIN 1000 MG tablet Commonly known as: GLUCOPHAGE Take 1,000 mg by mouth 2 (two) times daily with a meal.   methimazole 5 MG tablet Commonly known as: TAPAZOLE Take 3 tablets (15 mg total) by mouth 3 (three) times daily.   metoprolol succinate 50 MG 24 hr tablet Commonly known as: TOPROL-XL Take 1 tablet (50 mg total) by mouth daily. Take with or immediately following a meal.   Mounjaro 12.5 MG/0.5ML Pen Generic drug: tirzepatide Inject 12.5 mg into the skin once a week.   multivitamin with minerals Tabs tablet Take 1 tablet by mouth daily.   pantoprazole 40 MG tablet Commonly known as: PROTONIX Take 40 mg by mouth every evening.   pravastatin 40 MG tablet Commonly known as: PRAVACHOL Take 40 mg by mouth at bedtime.   predniSONE 20 MG tablet Commonly known as: DELTASONE Take 2 tablets (40 mg total) by mouth daily with breakfast for 14 days.   traZODone 50 MG tablet Commonly known as: DESYREL Take 1 tablet (50 mg total) by mouth at bedtime.   triamcinolone cream 0.1 % Commonly known as: KENALOG Apply 1 Application topically 2 (two) times daily as needed.        Discharge Exam: Filed Weights   08/22/23 0500 08/23/23 0400 08/24/23 0500  Weight: 112.9 kg 114.6 kg 115 kg   GEN: NAD SKIN: Warm and dry EYES: No pallor or icterus ENT: MMM, no JVD CV: RRR PULM: CTA B ABD: soft, ND, NT, +BS CNS: AAO x 3, non  focal EXT: Bilateral leg edema, no tenderness   Condition at discharge: good  The results of significant diagnostics from this hospitalization (including imaging, microbiology, ancillary and laboratory) are listed below for reference.   Imaging Studies: US THYROID Result Date: 2023-09-01 CLINICAL DATA:  Hyperthyroidism EXAM: THYROID ULTRASOUND TECHNIQUE: Ultrasound examination of the thyroid gland and adjacent soft tissues was performed. COMPARISON:  None available. FINDINGS: Parenchymal Echotexture: Mildly heterogenous Isthmus: 0.4 cm Right lobe: 6.0 x 2.2 x 2.5 cm Left lobe: 5.2 x 2.2 x 1.9 cm _________________________________________________________ Estimated total number of nodules >/= 1 cm: 0 Number of spongiform nodules >/=  2 cm not described below (TR1): 0 Number of mixed cystic and solid nodules >/= 1.5 cm not described below (TR2): 0 _________________________________________________________ Nodule 1: 0.7 x 0.6 x 0.4 cm solid hypoechoic isthmus nodule does not meet criteria for imaging surveillance or FNA. IMPRESSION: 1. Moderately heterogeneous, mildly enlarged thyroid. 2. No nodules requiring follow-up or biopsy. The above is in  keeping with the ACR TI-RADS recommendations - J Am Coll Radiol 2017;14:587-595. Electronically Signed   By: Acquanetta Belling M.D.   On: 08/21/2023 12:14   DG Chest Port 1 View Result Date: 08/20/2023 CLINICAL DATA:  Check PICC placement EXAM: PORTABLE CHEST 1 VIEW COMPARISON:  08/19/2023 FINDINGS: Cardiac shadow is stable. Right PICC is noted at the cavoatrial junction in satisfactory position. The lungs are clear bilaterally. No focal infiltrate or effusion is noted. No bony abnormality is seen. IMPRESSION: Right PICC in satisfactory position. Electronically Signed   By: Alcide Clever M.D.   On: 08/20/2023 20:06   [place] EKG SITE RITE Result Date: 08/20/2023 If Site Rite image not attached, placement could not be confirmed due to current cardiac  rhythm.  ECHOCARDIOGRAM COMPLETE Result Date: 08/20/2023    ECHOCARDIOGRAM REPORT   Patient Name:   William [name]. Date of Exam: 08/20/2023 Medical Rec #:  [id number]        Height:       71.0 in Accession #:    [id number]       Weight:       250.0 lb Date of Birth:  1956/07/16        BSA:          2.318 m Patient Age:    67 years         BP:           80/58 mmHg Patient Gender: M                HR:           112 bpm. Exam Location:  [facility] Procedure: 2D Echo, Cardiac Doppler, Color Doppler and Strain Analysis Indications:     CHF-acute systolic I50.21  History:         Patient has no prior history of Echocardiogram examinations,                  most recent 01/03/2023. CHF; Risk Factors:Diabetes.  Sonographer:     Cristela Blue Referring Phys:  [id number] Andris Baumann Diagnosing Phys: Julien Nordmann MD IMPRESSIONS  1. Left ventricular ejection fraction, by estimation, is 20 to 25%. The left ventricle has severely decreased function. The left ventricle demonstrates global hypokinesis. The left ventricular internal cavity size was moderately dilated. Left ventricular diastolic parameters are indeterminate.  2. Right ventricular systolic function is normal. The right ventricular size is normal.  3. Left atrial size was moderately dilated.  4. The mitral valve is normal in structure. Mild to moderate mitral valve regurgitation. No evidence of mitral stenosis.  5. The aortic valve is normal in structure. There is moderate calcification of the aortic valve. Aortic valve regurgitation is mild. Moderate aortic valve stenosis. Aortic valve area, by VTI measures 0.97 cm. Aortic valve mean gradient measures 16.0 mmHg. Aortic valve Vmax measures 2.81 m/s.  6. The inferior vena cava is normal in size with greater than 50% respiratory variability, suggesting right atrial pressure of 3 mmHg. FINDINGS  Left Ventricle: Left ventricular ejection fraction, by estimation, is 20 to 25%. The left ventricle has severely decreased  function. The left ventricle demonstrates global hypokinesis. The left ventricular internal cavity size was moderately dilated. There is no left ventricular hypertrophy. Left ventricular diastolic parameters are indeterminate. Right Ventricle: The right ventricular size is normal. No increase in right ventricular wall thickness. Right ventricular systolic function is normal. Left Atrium: Left atrial size was moderately dilated. Right Atrium: Right atrial size was normal  in size. Pericardium: There is no evidence of pericardial effusion. Mitral Valve: The mitral valve is normal in structure. Mild to moderate mitral valve regurgitation. No evidence of mitral valve stenosis. Tricuspid Valve: The tricuspid valve is normal in structure. Tricuspid valve regurgitation is mild . No evidence of tricuspid stenosis. Aortic Valve: The aortic valve is normal in structure. There is moderate calcification of the aortic valve. Aortic valve regurgitation is mild. Moderate aortic stenosis is present. Aortic valve mean gradient measures 16.0 mmHg. Aortic valve peak gradient  measures 31.5 mmHg. Aortic valve area, by VTI measures 0.97 cm. Pulmonic Valve: The pulmonic valve was normal in structure. Pulmonic valve regurgitation is not visualized. No evidence of pulmonic stenosis. Aorta: The aortic root is normal in size and structure. Venous: The inferior vena cava is normal in size with greater than 50% respiratory variability, suggesting right atrial pressure of 3 mmHg. IAS/Shunts: No atrial level shunt detected by color flow Doppler.  LEFT VENTRICLE PLAX 2D LVIDd:         5.50 cm LVIDs:         5.30 cm      2D Longitudinal Strain LV PW:         1.10 cm      2D Strain GLS Avg:     -3.7 % LV IVS:        1.30 cm LVOT diam:     2.00 cm LV SV:         43 LV SV Index:   18 LVOT Area:     3.14 cm  LV Volumes (MOD) LV vol d, MOD A4C: 162.0 ml LV vol s, MOD A4C: 151.0 ml LV SV MOD A4C:     162.0 ml LEFT ATRIUM             Index        RIGHT  ATRIUM           Index LA diam:        5.00 cm 2.16 cm/m   RA Area:     13.50 cm LA Vol (A2C):   78.3 ml 33.77 ml/m  RA Volume:   39.40 ml  17.00 ml/m LA Vol (A4C):   45.6 ml 19.67 ml/m LA Biplane Vol: 65.5 ml 28.25 ml/m  AORTIC VALVE AV Area (Vmax):    0.82 cm AV Area (Vmean):   0.88 cm AV Area (VTI):     0.97 cm AV Vmax:           280.67 cm/s AV Vmean:          177.000 cm/s AV VTI:            0.442 m AV Peak Grad:      31.5 mmHg AV Mean Grad:      16.0 mmHg LVOT Vmax:         73.40 cm/s LVOT Vmean:        49.300 cm/s LVOT VTI:          0.136 m LVOT/AV VTI ratio: 0.31  AORTA Ao Root diam: 3.10 cm MITRAL VALVE                TRICUSPID VALVE MV Area (PHT): 5.75 cm     TR Peak grad:   27.7 mmHg MV Decel Time: 132 msec     TR Vmax:        263.00 cm/s MV E velocity: 122.00 cm/s  SHUNTS                             Systemic VTI:  0.14 m                             Systemic Diam: 2.00 cm Julien Nordmann MD Electronically signed by Julien Nordmann MD Signature Date/Time: 08/20/2023/3:13:56 PM    Final    CT Angio Chest PE W and/or Wo Contrast Result Date: 08/20/2023 CLINICAL DATA:  Positive D-dimer shortness of breath, dyspnea on exertion, orthopnea, and increasing lower extremity swelling now extending into the scrotum. EXAM: CT ANGIOGRAPHY CHEST WITH CONTRAST TECHNIQUE: Multidetector CT imaging of the chest was performed using the standard protocol during bolus administration of intravenous contrast. Multiplanar CT image reconstructions and MIPs were obtained to evaluate the vascular anatomy. RADIATION DOSE REDUCTION: This exam was performed according to the departmental dose-optimization program which includes automated exposure control, adjustment of the mA and/or kV according to patient size and/or use of iterative reconstruction technique. CONTRAST:  80mL OMNIPAQUE IOHEXOL 350 MG/ML SOLN COMPARISON:  Portable chest today, PA Lat chest 11/25/2019, and CTA chest 11/24/2019.  FINDINGS: Cardiovascular: The pulmonary trunk was 3.3 cm indicating arterial hypertension, previously 3 cm. No arterial embolic filling defects are seen. The superior pulmonary veins are mildly prominent. There is mild cardiomegaly with a left chamber predominance, moderate patchy three-vessel coronary calcifications are noted. No pericardial effusion. There is a 4 vessel aortic arch with normal variant arch origin of the left vertebral artery. The great vessels are clear. There is relatively mild aortic atherosclerosis, but there are also moderate to heavy calcifications extending across the aortic valve leaflets. There is no aortic aneurysm, stenosis or dissection. There is a small amount of air in the right internal mammary vein and in the brachiocephalic venous arch, probably either injected at the time of IV placement or with the iodinated contrast. Mediastinum/Nodes: No enlarged mediastinal, hilar, or axillary lymph nodes. The partially visible thyroid gland, thoracic trachea, and esophagus demonstrate no significant findings. Lungs/Pleura: There is diffuse bronchial thickening, greater in the lower lobes. There is mild interlobular septal thickening in the lower lobes consistent with interstitial edema. Small to moderate symmetric layering pleural effusions are similar to 2021. There is posterior atelectasis primarily in the lower lobes but no focal consolidation is seen. Upper Abdomen: 4.6 cm rim calcified cyst again noted in the upper pole right kidney, internal Hounsfield density of 8. This is unchanged in size, with ultrasound dated 11/24/2019 noting no wall complexity. No follow-up imaging is recommended. There are gastric bypass changes again noted. Mild hepatic steatosis, and advanced fatty atrophy in the pancreas. No acute upper abdominal abnormality is seen. Musculoskeletal: No chest wall abnormality. No acute or significant osseous findings. Review of the MIP images confirms the above findings.  IMPRESSION: 1. No evidence of arterial embolus. 2. Mild cardiomegaly with left chamber predominance, and moderate coronary calcifications. 3. Enlarged pulmonary trunk 3.3 cm indicating arterial hypertension. 4. Mild interstitial edema in the lower lobes, with small to moderate symmetric layering pleural effusions similar to 2021. 5. Diffuse bronchial thickening, greater in the lower lobes. Posterior atelectasis. 6. Aortic and coronary artery atherosclerosis. 7. Moderate to heavy calcifications extending across the aortic valve leaflets. Echocardiography may be helpful. 8. Hepatic steatosis. 9. 4.6 cm right renal cyst, with no wall complexity on prior ultrasound. No follow-up imaging recommended. Unchanged. Aortic Atherosclerosis (  ICD10-I70.0). Electronically Signed   By: Almira Bar M.D.   On: 08/20/2023 07:35   DG Chest 1 View Result Date: 08/19/2023 CLINICAL DATA:  Chest pain, cough EXAM: CHEST  1 VIEW COMPARISON:  11/25/2019 FINDINGS: Lungs are clear. Suspected trace left pleural effusion. No pneumothorax. The heart is top-normal in size. IMPRESSION: Suspected trace left pleural effusion. Electronically Signed   By: Charline Bills M.D.   On: 08/19/2023 22:54    Microbiology: Results for orders placed or performed during the hospital encounter of 08/20/23  Resp panel by RT-PCR (RSV, Flu A&B, Covid) Anterior Nasal Swab     Status: None   Collection Time: 08/20/23  1:58 AM   Specimen: Anterior Nasal Swab  Result Value Ref Range Status   SARS Coronavirus 2 by RT PCR NEGATIVE NEGATIVE Final    Comment: (NOTE) SARS-CoV-2 target nucleic acids are NOT DETECTED.  The SARS-CoV-2 RNA is generally detectable in upper respiratory specimens during the acute phase of infection. The lowest concentration of SARS-CoV-2 viral copies this assay can detect is 138 copies/mL. A negative result does not preclude SARS-Cov-2 infection and should not be used as the sole basis for treatment or other patient  management decisions. A negative result may occur with  improper specimen collection/handling, submission of specimen other than nasopharyngeal swab, presence of viral mutation(s) within the areas targeted by this assay, and inadequate number of viral copies(<138 copies/mL). A negative result must be combined with clinical observations, patient history, and epidemiological information. The expected result is Negative.  Fact Sheet for Patients:  BloggerCourse.com  Fact Sheet for Healthcare Providers:  SeriousBroker.it  This test is no t yet approved or cleared by the [place] FDA and  has been authorized for detection and/or diagnosis of SARS-CoV-2 by FDA under an Emergency Use Authorization (EUA). This EUA will remain  in effect (meaning this test can be used) for the duration of the COVID-19 declaration under Section 564(b)(1) of the Act, 21 U.S.C.section 360bbb-3(b)(1), unless the authorization is terminated  or revoked sooner.       Influenza A by PCR NEGATIVE NEGATIVE Final   Influenza B by PCR NEGATIVE NEGATIVE Final    Comment: (NOTE) The Xpert Xpress SARS-CoV-2/FLU/RSV plus assay is intended as an aid in the diagnosis of influenza from Nasopharyngeal swab specimens and should not be used as a sole basis for treatment. Nasal washings and aspirates are unacceptable for Xpert Xpress SARS-CoV-2/FLU/RSV testing.  Fact Sheet for Patients: BloggerCourse.com  Fact Sheet for Healthcare Providers: SeriousBroker.it  This test is not yet approved or cleared by the [place] FDA and has been authorized for detection and/or diagnosis of SARS-CoV-2 by FDA under an Emergency Use Authorization (EUA). This EUA will remain in effect (meaning this test can be used) for the duration of the COVID-19 declaration under Section 564(b)(1) of the Act, 21 U.S.C. section 360bbb-3(b)(1),  unless the authorization is terminated or revoked.     Resp Syncytial Virus by PCR NEGATIVE NEGATIVE Final    Comment: (NOTE) Fact Sheet for Patients: BloggerCourse.com  Fact Sheet for Healthcare Providers: SeriousBroker.it  This test is not yet approved or cleared by the [place] FDA and has been authorized for detection and/or diagnosis of SARS-CoV-2 by FDA under an Emergency Use Authorization (EUA). This EUA will remain in effect (meaning this test can be used) for the duration of the COVID-19 declaration under Section 564(b)(1) of the Act, 21 U.S.C. section 360bbb-3(b)(1), unless the authorization is terminated or revoked.  Performed at  [facility], [address] [place], [place] [address]   Respiratory (~20 pathogens) panel by PCR     Status: None   Collection Time: 08/20/23  6:54 PM   Specimen: Nasopharyngeal Swab; Respiratory  Result Value Ref Range Status   Adenovirus NOT DETECTED NOT DETECTED Final   Coronavirus 229E NOT DETECTED NOT DETECTED Final    Comment: (NOTE) The Coronavirus on the Respiratory Panel, DOES NOT test for the novel  Coronavirus (2019 nCoV)    Coronavirus HKU1 NOT DETECTED NOT DETECTED Final   Coronavirus NL63 NOT DETECTED NOT DETECTED Final   Coronavirus OC43 NOT DETECTED NOT DETECTED Final   Metapneumovirus NOT DETECTED NOT DETECTED Final   Rhinovirus / Enterovirus NOT DETECTED NOT DETECTED Final   Influenza A NOT DETECTED NOT DETECTED Final   Influenza B NOT DETECTED NOT DETECTED Final   Parainfluenza Virus 1 NOT DETECTED NOT DETECTED Final   Parainfluenza Virus 2 NOT DETECTED NOT DETECTED Final   Parainfluenza Virus 3 NOT DETECTED NOT DETECTED Final   Parainfluenza Virus 4 NOT DETECTED NOT DETECTED Final   Respiratory Syncytial Virus NOT DETECTED NOT DETECTED Final   Bordetella pertussis NOT DETECTED NOT DETECTED Final   Bordetella Parapertussis NOT DETECTED NOT DETECTED  Final   Chlamydophila pneumoniae NOT DETECTED NOT DETECTED Final   Mycoplasma pneumoniae NOT DETECTED NOT DETECTED Final    Comment: Performed at [facility], [address] [place], [place] [address]    Labs: CBC: Recent Labs  Lab 08/19/23 2232 08/21/23 0550 08/22/23 0319 08/23/23 0401  WBC 10.3 9.4 9.3 9.3  HGB 12.7* 12.8* 11.7* 11.6*  HCT 38.3* 37.8* 34.9* 34.4*  MCV 89.9 88.1 87.9 88.0  PLT 281 260 241 226   Basic Metabolic Panel: Recent Labs  Lab 08/20/23 2011 08/21/23 0550 08/21/23 1402 08/22/23 0319 08/23/23 0401 08/24/23 0717  NA 137 137  --  136 137 136  K 3.5 3.1* 4.4 3.0* 3.6 3.3*  CL 103 99  --  100 101 99  CO2 27 26  --  27 27 27   GLUCOSE 177* 138*  --  143* 129* 130*  BUN 23 20  --  20 23 25*  CREATININE 0.87 0.80  --  0.84 0.75 0.83  CALCIUM 8.3* 8.1*  --  8.3* 8.5* 8.5*  MG 2.0 1.9 2.1 2.1 2.4  --   PHOS 4.9* 4.8* 4.2 4.7* 4.8*  --    Liver Function Tests: No results for input(s): "AST", "ALT", "ALKPHOS", "BILITOT", "PROT", "ALBUMIN" in the last 168 hours. CBG: Recent Labs  Lab 08/22/23 2106 08/23/23 0751 08/23/23 1127 08/23/23 1636 08/24/23 0746  GLUCAP 151* 127* 191* 171* 128*    Discharge time spent: greater than 30 minutes.  Signed: Lurene Shadow, MD [facility] 08/24/2023

## 2023-08-24 NOTE — Progress Notes (Signed)
Rounding Note    Patient Name: William [name]. Date of Encounter: 08/24/2023  [facility] Cardiologist: Julien Nordmann, MD   Subjective   Denies chest pain, shortness of breath, palpitations.  States feeling okay.  Edema is adequately controlled since starting Lasix.  Inpatient Medications    Scheduled Meds:  apixaban  5 mg Oral BID   Chlorhexidine Gluconate Cloth  6 each Topical Q0600   chlorpheniramine-HYDROcodone  5 mL Oral Q12H   empagliflozin  10 mg Oral Daily   FLUoxetine  40 mg Oral Daily   fluticasone  1 spray Each Nare Daily   furosemide  20 mg Oral Daily   insulin aspart  0-15 Units Subcutaneous TID WC   insulin aspart  0-5 Units Subcutaneous QHS   methIMAzole  15 mg Oral TID   metoprolol succinate  50 mg Oral Daily   mupirocin ointment  1 Application Nasal BID   pravastatin  40 mg Oral QHS   predniSONE  40 mg Oral Q breakfast   sodium chloride flush  10-40 mL Intracatheter Q12H   Continuous Infusions:  PRN Meds: acetaminophen **OR** acetaminophen, guaiFENesin, menthol-cetylpyridinium, ondansetron **OR** ondansetron (ZOFRAN) IV, sodium chloride flush, traZODone   Vital Signs    Vitals:   08/24/23 0900 08/24/23 0944 08/24/23 1000 08/24/23 1100  BP:  (!) 88/55 94/67 94/75   Pulse:      Resp: (!) 31 (!) 21 20 (!) 25  Temp:      TempSrc:      SpO2:      Weight:      Height:        Intake/Output Summary (Last 24 hours) at 08/24/2023 1519 Last data filed at 08/24/2023 0951 Gross per 24 hour  Intake 720 ml  Output --  Net 720 ml      08/24/2023    5:00 AM 08/23/2023    4:00 AM 08/22/2023    5:00 AM  Last 3 Weights  Weight (lbs) 253 lb 8.5 oz 252 lb 10.4 oz 248 lb 14.4 oz  Weight (kg) 115 kg 114.6 kg 112.9 kg      Telemetry    Sinus rhythm, heart rate 77- Personally Reviewed  ECG     - Personally Reviewed  Physical Exam   GEN: No acute distress.   Neck: No JVD Cardiac: Regular rate and rhythm Respiratory: Clear to  auscultation bilaterally. GI: Soft, nontender, non-distended  MS: No edema; No deformity. Neuro:  Nonfocal  Psych: Normal affect   Labs    High Sensitivity Troponin:   Recent Labs  Lab 08/19/23 2232 08/20/23 0050  TROPONINIHS 56* 46*     Chemistry Recent Labs  Lab 08/21/23 1402 08/22/23 0319 08/23/23 0401 08/24/23 0717  NA  --  136 137 136  K 4.4 3.0* 3.6 3.3*  CL  --  100 101 99  CO2  --  27 27 27   GLUCOSE  --  143* 129* 130*  BUN  --  20 23 25*  CREATININE  --  0.84 0.75 0.83  CALCIUM  --  8.3* 8.5* 8.5*  MG 2.1 2.1 2.4  --   GFRNONAA  --  >60 >60 >60  ANIONGAP  --  9 9 10     Lipids No results for input(s): "CHOL", "TRIG", "HDL", "LABVLDL", "LDLCALC", "CHOLHDL" in the last 168 hours.  Hematology Recent Labs  Lab 08/21/23 0550 08/22/23 0319 08/23/23 0401  WBC 9.4 9.3 9.3  RBC 4.29 3.97* 3.91*  HGB 12.8* 11.7* 11.6*  HCT 37.8* 34.9* 34.4*  MCV 88.1 87.9 88.0  MCH 29.8 29.5 29.7  MCHC 33.9 33.5 33.7  RDW 13.2 12.7 12.8  PLT 260 241 226   Thyroid  Recent Labs  Lab 08/20/23 0732 08/21/23 0550 08/24/23 0717  TSH <0.010*  --   --   FREET4 4.65*   < > 4.42*   < > = values in this interval not displayed.    BNP Recent Labs  Lab 08/19/23 2232  BNP 1,995.5*    DDimer  Recent Labs  Lab 08/20/23 0520  DDIMER 0.65*     Radiology    No results found.  Cardiac Studies   Echo 08/20/2023 EF 20 to 25%  Patient Profile     67 y.o. male with history of HFrEF, atrial fibrillation presenting with shortness of breath being seen for A-fib RVR and CHF exacerbation.  Assessment & Plan    A-fib RVR -In the context of amiodarone induced thyroid dysfunction -Currently in sinus rhythm -Hyperthyroidism being treated with methimazole,  -Switch Lopressor to Toprol-XL 50 mg daily. -Continue Eliquis 5 mg twice daily -Continued management of thyroid dysfunction will help with A-fib management.  2.  Cardiomyopathy EF 25% -Euvolemic -Toprol-XL 50 mg daily  as above.  Continue Lasix 20 mg daily. -Continue Jardiance Low BP preventing additional GDMT.  Okay for discharge on current medications from cardiac perspective.  Close follow-up with primary cardiologist as outpatient advised.      Signed, Debbe Odea, MD  08/24/2023, 3:19 PM

## 2023-08-24 NOTE — Progress Notes (Signed)
Pt discharged home at this time. Discharge packet given to pt with all questions answered.

## 2023-08-24 NOTE — Progress Notes (Signed)
Notified Dr. Myriam Forehand about pt's BP 88/55 (66). After recycle pts BP 94/67 (76). MD okay with administering metoprolol at this time. MD states plan is to discharge pt today so it is okay for pt to have no IV access.

## 2023-08-24 NOTE — Discharge Instructions (Signed)
Increase activity slowly Low-sodium heart healthy diet

## 2023-08-25 SURGERY — CARDIOVERSION
Anesthesia: General

## 2023-09-17 ENCOUNTER — Other Ambulatory Visit: Payer: Self-pay

## 2023-09-17 ENCOUNTER — Emergency Department: Payer: Managed Care, Other (non HMO)

## 2023-09-17 ENCOUNTER — Inpatient Hospital Stay
Admission: EM | Admit: 2023-09-17 | Discharge: 2023-09-25 | DRG: 643 | Disposition: A | Discharge status: Other Acute Inpatient Hospital | Payer: Managed Care, Other (non HMO)

## 2023-09-17 DIAGNOSIS — E0581 Other thyrotoxicosis with thyrotoxic crisis or storm: Principal | ICD-10-CM | POA: Diagnosis present

## 2023-09-17 DIAGNOSIS — I472 Ventricular tachycardia, unspecified: Secondary | ICD-10-CM | POA: Diagnosis present

## 2023-09-17 DIAGNOSIS — R0789 Other chest pain: Principal | ICD-10-CM | POA: Diagnosis present

## 2023-09-17 DIAGNOSIS — E0580 Other thyrotoxicosis without thyrotoxic crisis or storm: Secondary | ICD-10-CM

## 2023-09-17 DIAGNOSIS — E119 Type 2 diabetes mellitus without complications: Secondary | ICD-10-CM | POA: Diagnosis present

## 2023-09-17 DIAGNOSIS — Z1152 Encounter for screening for COVID-19: Secondary | ICD-10-CM

## 2023-09-17 DIAGNOSIS — E876 Hypokalemia: Secondary | ICD-10-CM | POA: Diagnosis present

## 2023-09-17 DIAGNOSIS — I350 Nonrheumatic aortic (valve) stenosis: Secondary | ICD-10-CM | POA: Diagnosis present

## 2023-09-17 DIAGNOSIS — J069 Acute upper respiratory infection, unspecified: Secondary | ICD-10-CM | POA: Diagnosis present

## 2023-09-17 DIAGNOSIS — E0590 Thyrotoxicosis, unspecified without thyrotoxic crisis or storm: Secondary | ICD-10-CM | POA: Insufficient documentation

## 2023-09-17 DIAGNOSIS — Z794 Long term (current) use of insulin: Secondary | ICD-10-CM | POA: Diagnosis not present

## 2023-09-17 DIAGNOSIS — E785 Hyperlipidemia, unspecified: Secondary | ICD-10-CM | POA: Diagnosis present

## 2023-09-17 DIAGNOSIS — I2510 Atherosclerotic heart disease of native coronary artery without angina pectoris: Secondary | ICD-10-CM | POA: Diagnosis present

## 2023-09-17 DIAGNOSIS — R059 Cough, unspecified: Secondary | ICD-10-CM

## 2023-09-17 DIAGNOSIS — Z96641 Presence of right artificial hip joint: Secondary | ICD-10-CM | POA: Diagnosis present

## 2023-09-17 DIAGNOSIS — I5023 Acute on chronic systolic (congestive) heart failure: Secondary | ICD-10-CM | POA: Diagnosis present

## 2023-09-17 DIAGNOSIS — Z79899 Other long term (current) drug therapy: Secondary | ICD-10-CM

## 2023-09-17 DIAGNOSIS — I110 Hypertensive heart disease with heart failure: Principal | ICD-10-CM | POA: Diagnosis present

## 2023-09-17 DIAGNOSIS — R079 Chest pain, unspecified: Secondary | ICD-10-CM | POA: Diagnosis not present

## 2023-09-17 DIAGNOSIS — Z7984 Long term (current) use of oral hypoglycemic drugs: Secondary | ICD-10-CM | POA: Diagnosis not present

## 2023-09-17 DIAGNOSIS — Z888 Allergy status to other drugs, medicaments and biological substances status: Secondary | ICD-10-CM

## 2023-09-17 DIAGNOSIS — Z7985 Long-term (current) use of injectable non-insulin antidiabetic drugs: Secondary | ICD-10-CM

## 2023-09-17 DIAGNOSIS — I700 Atherosclerosis of aorta: Secondary | ICD-10-CM | POA: Diagnosis present

## 2023-09-17 DIAGNOSIS — I351 Nonrheumatic aortic (valve) insufficiency: Secondary | ICD-10-CM | POA: Diagnosis not present

## 2023-09-17 DIAGNOSIS — E064 Drug-induced thyroiditis: Secondary | ICD-10-CM | POA: Diagnosis present

## 2023-09-17 DIAGNOSIS — Z885 Allergy status to narcotic agent status: Secondary | ICD-10-CM

## 2023-09-17 DIAGNOSIS — E1169 Type 2 diabetes mellitus with other specified complication: Secondary | ICD-10-CM | POA: Diagnosis not present

## 2023-09-17 DIAGNOSIS — K219 Gastro-esophageal reflux disease without esophagitis: Secondary | ICD-10-CM | POA: Diagnosis present

## 2023-09-17 DIAGNOSIS — E66811 Obesity, class 1: Secondary | ICD-10-CM | POA: Diagnosis present

## 2023-09-17 DIAGNOSIS — G4700 Insomnia, unspecified: Secondary | ICD-10-CM | POA: Diagnosis present

## 2023-09-17 DIAGNOSIS — Z6832 Body mass index (BMI) 32.0-32.9, adult: Secondary | ICD-10-CM

## 2023-09-17 DIAGNOSIS — Z9884 Bariatric surgery status: Secondary | ICD-10-CM

## 2023-09-17 DIAGNOSIS — E0591 Thyrotoxicosis, unspecified with thyrotoxic crisis or storm: Secondary | ICD-10-CM | POA: Insufficient documentation

## 2023-09-17 DIAGNOSIS — I4891 Unspecified atrial fibrillation: Secondary | ICD-10-CM | POA: Diagnosis present

## 2023-09-17 DIAGNOSIS — I447 Left bundle-branch block, unspecified: Secondary | ICD-10-CM | POA: Diagnosis present

## 2023-09-17 DIAGNOSIS — I340 Nonrheumatic mitral (valve) insufficiency: Secondary | ICD-10-CM | POA: Diagnosis not present

## 2023-09-17 DIAGNOSIS — I4819 Other persistent atrial fibrillation: Secondary | ICD-10-CM | POA: Diagnosis present

## 2023-09-17 DIAGNOSIS — I9589 Other hypotension: Secondary | ICD-10-CM | POA: Diagnosis present

## 2023-09-17 DIAGNOSIS — I420 Dilated cardiomyopathy: Secondary | ICD-10-CM | POA: Diagnosis present

## 2023-09-17 DIAGNOSIS — T462X5A Adverse effect of other antidysrhythmic drugs, initial encounter: Secondary | ICD-10-CM | POA: Diagnosis not present

## 2023-09-17 DIAGNOSIS — Z7901 Long term (current) use of anticoagulants: Secondary | ICD-10-CM

## 2023-09-17 DIAGNOSIS — Z85048 Personal history of other malignant neoplasm of rectum, rectosigmoid junction, and anus: Secondary | ICD-10-CM

## 2023-09-17 DIAGNOSIS — E039 Hypothyroidism, unspecified: Secondary | ICD-10-CM | POA: Diagnosis present

## 2023-09-17 DIAGNOSIS — Z8711 Personal history of peptic ulcer disease: Secondary | ICD-10-CM

## 2023-09-17 DIAGNOSIS — Z833 Family history of diabetes mellitus: Secondary | ICD-10-CM

## 2023-09-17 LAB — RESP PANEL BY RT-PCR (RSV, FLU A&B, COVID)  RVPGX2
Influenza A by PCR: NEGATIVE
Influenza B by PCR: NEGATIVE
Resp Syncytial Virus by PCR: NEGATIVE
SARS Coronavirus 2 by RT PCR: NEGATIVE

## 2023-09-17 LAB — CBC
HCT: 39.9 % (ref 39.0–52.0)
Hemoglobin: 12.5 g/dL — ABNORMAL LOW (ref 13.0–17.0)
MCH: 28.6 pg (ref 26.0–34.0)
MCHC: 31.3 g/dL (ref 30.0–36.0)
MCV: 91.3 fL (ref 80.0–100.0)
Platelets: 151 10*3/uL (ref 150–400)
RBC: 4.37 MIL/uL (ref 4.22–5.81)
RDW: 13.7 % (ref 11.5–15.5)
WBC: 11.6 10*3/uL — ABNORMAL HIGH (ref 4.0–10.5)
nRBC: 0 % (ref 0.0–0.2)

## 2023-09-17 LAB — CORTISOL: Cortisol, Plasma: 9.4 ug/dL

## 2023-09-17 LAB — TROPONIN I (HIGH SENSITIVITY)
Troponin I (High Sensitivity): 23 ng/L — ABNORMAL HIGH (ref <18)
Troponin I (High Sensitivity): 22 ng/L — ABNORMAL HIGH (ref <18)

## 2023-09-17 LAB — CBG MONITORING, ED: Glucose-Capillary: 143 mg/dL — ABNORMAL HIGH (ref 70–99)

## 2023-09-17 LAB — T4, FREE: Free T4: 4.66 ng/dL — ABNORMAL HIGH (ref 0.61–1.12)

## 2023-09-17 LAB — BASIC METABOLIC PANEL
Anion gap: 11 (ref 5–15)
BUN: 23 mg/dL (ref 8–23)
CO2: 21 mmol/L — ABNORMAL LOW (ref 22–32)
Calcium: 8.2 mg/dL — ABNORMAL LOW (ref 8.9–10.3)
Chloride: 104 mmol/L (ref 98–111)
Creatinine, Ser: 0.78 mg/dL (ref 0.61–1.24)
GFR, Estimated: >60 mL/min (ref >60)
Glucose, Bld: 176 mg/dL — ABNORMAL HIGH (ref 70–99)
Potassium: 4.3 mmol/L (ref 3.5–5.1)
Sodium: 136 mmol/L (ref 135–145)

## 2023-09-17 LAB — TSH: TSH: <0.01 u[IU]/mL — ABNORMAL LOW (ref 0.350–4.500)

## 2023-09-17 LAB — BRAIN NATRIURETIC PEPTIDE: B Natriuretic Peptide: 2499.1 pg/mL — ABNORMAL HIGH (ref 0.0–100.0)

## 2023-09-17 MED ORDER — FUROSEMIDE 10 MG/ML IJ SOLN
40.0000 mg | Freq: Every day | INTRAMUSCULAR | Status: DC
  Administered 2023-09-17: 40 mg via INTRAVENOUS
  Filled 2023-09-17: qty 4

## 2023-09-17 MED ORDER — FENTANYL CITRATE PF 50 MCG/ML IJ SOSY
50.0000 ug | PREFILLED_SYRINGE | Freq: Once | INTRAMUSCULAR | Status: AC
  Administered 2023-09-17: 50 ug via INTRAVENOUS
  Filled 2023-09-17: qty 1

## 2023-09-17 MED ORDER — ONDANSETRON HCL 4 MG/2ML IJ SOLN: 4.0000 mg | Freq: Four times a day (QID) | INTRAMUSCULAR | Status: DC | PRN

## 2023-09-17 MED ORDER — SODIUM CHLORIDE 0.9 % IV SOLN: 250.0000 mL | INTRAVENOUS | Status: AC | PRN

## 2023-09-17 MED ORDER — FLUOXETINE HCL 20 MG PO CAPS
40.0000 mg | ORAL_CAPSULE | Freq: Every day | ORAL | Status: DC
  Administered 2023-09-17 – 2023-09-25 (×9): 40 mg via ORAL
  Filled 2023-09-17 (×9): qty 2

## 2023-09-17 MED ORDER — IOHEXOL 350 MG/ML SOLN
100.0000 mL | Freq: Once | INTRAVENOUS | Status: AC | PRN
  Administered 2023-09-17: 100 mL via INTRAVENOUS

## 2023-09-17 MED ORDER — SODIUM CHLORIDE 0.9 % IV BOLUS: 500.0000 mL | Freq: Once | INTRAVENOUS | Status: DC

## 2023-09-17 MED ORDER — SODIUM CHLORIDE 0.9 % IV BOLUS
250.0000 mL | Freq: Once | INTRAVENOUS | Status: AC
  Administered 2023-09-17: 250 mL via INTRAVENOUS

## 2023-09-17 MED ORDER — TEMAZEPAM 15 MG PO CAPS
15.0000 mg | ORAL_CAPSULE | Freq: Every evening | ORAL | Status: DC | PRN
  Administered 2023-09-18: 15 mg via ORAL
  Filled 2023-09-17 (×2): qty 1

## 2023-09-17 MED ORDER — TRAZODONE HCL 50 MG PO TABS
50.0000 mg | ORAL_TABLET | Freq: Once | ORAL | Status: AC
  Administered 2023-09-17: 50 mg via ORAL
  Filled 2023-09-17: qty 1

## 2023-09-17 MED ORDER — DIGOXIN 0.25 MG/ML IJ SOLN
0.2500 mg | INTRAMUSCULAR | Status: AC
  Administered 2023-09-17 (×2): 0.25 mg via INTRAVENOUS
  Filled 2023-09-17 (×2): qty 2

## 2023-09-17 MED ORDER — DIGOXIN 250 MCG PO TABS
0.2500 mg | ORAL_TABLET | Freq: Every day | ORAL | Status: DC
  Administered 2023-09-18 – 2023-09-21 (×4): 0.25 mg via ORAL
  Filled 2023-09-17 (×6): qty 1

## 2023-09-17 MED ORDER — SODIUM CHLORIDE 0.9% FLUSH: 3.0000 mL | INTRAVENOUS | Status: DC | PRN

## 2023-09-17 MED ORDER — HYDROCORTISONE SOD SUC (PF) 100 MG IJ SOLR
100.0000 mg | Freq: Once | INTRAMUSCULAR | Status: AC
  Administered 2023-09-17: 100 mg via INTRAVENOUS
  Filled 2023-09-17: qty 2

## 2023-09-17 MED ORDER — INSULIN ASPART 100 UNIT/ML IJ SOLN
0.0000 [IU] | Freq: Three times a day (TID) | INTRAMUSCULAR | Status: DC
  Administered 2023-09-17: 1 [IU] via SUBCUTANEOUS
  Administered 2023-09-18: 2 [IU] via SUBCUTANEOUS
  Administered 2023-09-18: 3 [IU] via SUBCUTANEOUS
  Administered 2023-09-18 – 2023-09-19 (×2): 1 [IU] via SUBCUTANEOUS
  Filled 2023-09-17 (×4): qty 1

## 2023-09-17 MED ORDER — METHIMAZOLE 5 MG PO TABS
15.0000 mg | ORAL_TABLET | Freq: Three times a day (TID) | ORAL | Status: DC
  Administered 2023-09-17 – 2023-09-25 (×24): 15 mg via ORAL
  Filled 2023-09-17 (×28): qty 1

## 2023-09-17 MED ORDER — HYDROCOD POLI-CHLORPHE POLI ER 10-8 MG/5ML PO SUER
5.0000 mL | Freq: Once | ORAL | Status: AC
  Administered 2023-09-17: 5 mL via ORAL
  Filled 2023-09-17: qty 5

## 2023-09-17 MED ORDER — ONDANSETRON HCL 4 MG PO TABS: 4.0000 mg | ORAL_TABLET | Freq: Four times a day (QID) | ORAL | Status: DC | PRN

## 2023-09-17 MED ORDER — PANTOPRAZOLE SODIUM 40 MG PO TBEC
40.0000 mg | DELAYED_RELEASE_TABLET | Freq: Every evening | ORAL | Status: DC
  Administered 2023-09-17 – 2023-09-24 (×8): 40 mg via ORAL
  Filled 2023-09-17 (×8): qty 1

## 2023-09-17 MED ORDER — METOPROLOL TARTRATE 5 MG/5ML IV SOLN
2.5000 mg | Freq: Once | INTRAVENOUS | Status: AC
  Administered 2023-09-17: 2.5 mg via INTRAVENOUS
  Filled 2023-09-17: qty 5

## 2023-09-17 MED ORDER — PRAVASTATIN SODIUM 40 MG PO TABS
40.0000 mg | ORAL_TABLET | Freq: Every day | ORAL | Status: DC
  Administered 2023-09-17 – 2023-09-24 (×8): 40 mg via ORAL
  Filled 2023-09-17: qty 1
  Filled 2023-09-17: qty 2
  Filled 2023-09-17 (×6): qty 1

## 2023-09-17 MED ORDER — APIXABAN 5 MG PO TABS
5.0000 mg | ORAL_TABLET | Freq: Two times a day (BID) | ORAL | Status: DC
  Administered 2023-09-17 – 2023-09-25 (×16): 5 mg via ORAL
  Filled 2023-09-17 (×16): qty 1

## 2023-09-17 MED ORDER — FAMOTIDINE IN NACL 20-0.9 MG/50ML-% IV SOLN
20.0000 mg | Freq: Once | INTRAVENOUS | Status: AC
  Administered 2023-09-17: 20 mg via INTRAVENOUS
  Filled 2023-09-17: qty 50

## 2023-09-17 MED ORDER — FUROSEMIDE 10 MG/ML IJ SOLN
40.0000 mg | Freq: Once | INTRAMUSCULAR | Status: AC
  Administered 2023-09-17: 40 mg via INTRAVENOUS
  Filled 2023-09-17: qty 4

## 2023-09-17 MED ORDER — EMPAGLIFLOZIN 25 MG PO TABS
25.0000 mg | ORAL_TABLET | Freq: Every day | ORAL | Status: DC
  Administered 2023-09-17 – 2023-09-25 (×9): 25 mg via ORAL
  Filled 2023-09-17 (×10): qty 1

## 2023-09-17 MED ORDER — LEVALBUTEROL HCL 0.63 MG/3ML IN NEBU: 0.6300 mg | INHALATION_SOLUTION | Freq: Four times a day (QID) | RESPIRATORY_TRACT | Status: DC | PRN

## 2023-09-17 MED ORDER — METOPROLOL SUCCINATE ER 25 MG PO TB24
25.0000 mg | ORAL_TABLET | Freq: Every day | ORAL | Status: DC
  Administered 2023-09-17 – 2023-09-19 (×3): 25 mg via ORAL
  Filled 2023-09-17 (×3): qty 1

## 2023-09-17 MED ORDER — SODIUM CHLORIDE 0.9% FLUSH
3.0000 mL | Freq: Two times a day (BID) | INTRAVENOUS | Status: DC
  Administered 2023-09-18 (×3): 3 mL via INTRAVENOUS

## 2023-09-17 NOTE — ED Triage Notes (Signed)
 Pt reports chest pain that woke him from his sleep this morning, pt reports cough since December. Pt was hospitalized in December for hyperthyroidism and bronchitis.

## 2023-09-17 NOTE — Consult Note (Signed)
 Cardiology Consultation   Patient ID: William [name]. MRN: [id number]; DOB: [birth year]  Admit date: 09/17/2023 Date of Consult: 09/17/2023  PCP:  Fernande Ophelia JINNY DOUGLAS, MD   [facility] Providers Cardiologist:  Evalene Lunger, MD        Patient Profile:   William Pettry. is a 68 y.o. male with a hx of dilated cardiomyopathy with LVEF 20-25%, persistent atrial fibrillation s/p cardioversion, amiodarone  induced thyrotoxicosis, T2DM, mild aortic stenosis, LBBB, hx morbid obesity and gastric bypass surgery who is being seen 09/17/2023 for the evaluation of CHF exacerbation and atrial fibrillation with RVR at the request of Dr. Eldonna.  History of Present Illness:   William [name] follows with Dr. Gollan and was last seen in clinic 07/15/2022 for routine follow up during which he reported doing well. He was taking medications as prescribed with mild improvement in LVEF from <25% to 35-40%. He was maintaining sinus rhythm with last episode of afib in 2021.  Patient was recently hospitalized at [facility] from 08/20/2023-08/24/2023 for CHF exacerbation and paroxysmal atrial fibrillation. He presented at that time for worsening dyspnea, orthopnea, and volume overload in the setting of upper respiratory tract infection. Converted from NSR to atrial fibrillation shortly after admission. Echo showed LVEF 20-25% with global hypokinesis, moderately dilated left ventricle, moderately dilated left atrium, and moderate aortic stenosis. BNP 1995. Troponin elevated, demand ischemia. Cardiology and later AHF consulted. GDMT held due to hypotension and required norepi for pressure support. Started on amiodarone  and spontaneously converted back to sinus rhythm but later developed amiodarone  induced thyrotoxicosis. Fluid overload treated with IV Lasix . He was discharged in NSR on Eliquis  5 mg, Jardiance  25 mg, furosemide  20 mg, methimazole  5 mg TID, metoprolol  succinate 50 mg, and pravastatin  40 mg.   Patient tells me  that when he was discharged after his last hospitalization he continued to have cough, shortness of breath and LE edema which resolved after a couple weeks. He was feeling well until 1-2 weeks ago when he again developed a cough. This morning he developed a pressing chest pain, worsened with coughing and reproducible with palpation of the chest wall, which prompted him to report to the ED. Tells me he has been taking his medication as prescribed. Reports he has had trouble sleeping but this is not due to shortness of breath. He denies orthopnea, PND, shortness of breath, palpitations, diaphoresis, nausea, fever, chills, and lightheadedness. In the ED, BP 114/82, HR 120, RR 20, T 97.38F, SpO2 98%. Pertinent labs include glucose 176, WBC 11.6, TSH 0.010 and T4 4.66. Respiratory panel negative. CXR shows findings consistent with CHF and mild interstitial edema with small pleural effusions. CTA shows small pleural effusions, coronary artery calcifications, and aortic atherosclerosis. BNP 2499. Troponin 23>22. EKG shows atrial fibrillation with RVR, rate 114, know LBBB. He was given IV lasix  40 mg and IV fentanyl .   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   CHF (congestive heart failure) (HCC)    Diabetes mellitus without complication (HCC)    GERD (gastroesophageal reflux disease)    Hypertension    Iron deficiency anemia    Obesity    Osteoarthritis of hip    right   Peptic ulcer disease     Past Surgical History:  Procedure Laterality Date   CARDIOVERSION N/A 11/26/2019   Procedure: CARDIOVERSION;  Surgeon: Bosie Vinie LABOR, MD;  Location: [facility] ORS;  Service:  Cardiovascular;  Laterality: N/A;   COLONOSCOPY     ESOPHAGOGASTRODUODENOSCOPY     GASTRIC BYPASS     2009   NASAL SINUS SURGERY     TOTAL HIP ARTHROPLASTY Right 08/20/2016   Procedure: TOTAL HIP ARTHROPLASTY ANTERIOR APPROACH;  Surgeon: Ozell Flake, MD;  Location: [facility] ORS;  Service:  Orthopedics;  Laterality: Right;     Home Medications:  Prior to Admission medications   Medication Sig Start Date End Date Taking? Authorizing Provider  albuterol (VENTOLIN HFA) 108 (90 Base) MCG/ACT inhaler Inhale 2 puffs into the lungs every 6 (six) hours as needed for wheezing. 08/27/23  Yes [provider]  amoxicillin (AMOXIL) 500 MG capsule Take 2,000 mg by mouth once as needed (Pretreatment for Procedure(s)). 06/12/23   [provider]  betamethasone valerate (VALISONE) 0.1 % cream Apply 1 Application topically 2 (two) times daily as needed. 04/11/23   [provider]  Cyanocobalamin  (B-12) 2500 MCG TABS Take 5,000 mcg by mouth daily.     [provider]  ELIQUIS  5 MG TABS tablet TAKE 1 TABLET BY MOUTH TWICE  DAILY 02/11/23   Donette [place] A, FNP  FLUoxetine  (PROZAC ) 40 MG capsule Take 1 capsule by mouth daily. 05/23/23   [provider]  furosemide  (LASIX ) 40 MG tablet Take 0.5 tablets (20 mg total) by mouth daily. 08/24/23   Jens Durand, MD  ipratropium (ATROVENT ) 0.03 % nasal spray Place 2 sprays into the nose 2 (two) times daily. 08/14/23 08/28/23  [provider]  JARDIANCE  25 MG TABS tablet Take 25 mg by mouth daily. 11/02/19   [provider]  metFORMIN  (GLUCOPHAGE ) 1000 MG tablet Take 1,000 mg by mouth 2 (two) times daily with a meal.     [provider]  methimazole  (TAPAZOLE ) 5 MG tablet Take 3 tablets (15 mg total) by mouth 3 (three) times daily. 08/24/23   Jens Durand, MD  metoprolol  succinate (TOPROL -XL) 50 MG 24 hr tablet Take 1 tablet (50 mg total) by mouth daily. Take with or immediately following a meal. 08/24/23   Jens Durand, MD  Multiple Vitamin (MULTIVITAMIN WITH MINERALS) TABS tablet Take 1 tablet by mouth daily.    [provider]  pantoprazole  (PROTONIX ) 40 MG tablet Take 40 mg by mouth every evening.     [provider]  potassium chloride  SA (KLOR-CON  M) 20 MEQ tablet Take  1 tablet (20 mEq total) by mouth daily for 5 days. 08/24/23 08/29/23  Jens Durand, MD  pravastatin  (PRAVACHOL ) 40 MG tablet Take 40 mg by mouth at bedtime.  09/22/19   [provider]  tirzepatide CLOYDE) 12.5 MG/0.5ML Pen Inject 12.5 mg into the skin once a week. 05/16/23   [provider]  traZODone  (DESYREL ) 50 MG tablet Take 1 tablet (50 mg total) by mouth at bedtime. 08/24/23   Jens Durand, MD  triamcinolone  cream (KENALOG ) 0.1 % Apply 1 Application topically 2 (two) times daily as needed. 04/07/23   [provider]    Inpatient Medications: Scheduled Meds:  apixaban   5 mg Oral BID   empagliflozin   25 mg Oral Daily   FLUoxetine   40 mg Oral Daily   insulin  aspart  0-9 Units Subcutaneous TID WC   methimazole   15 mg Oral TID   pantoprazole   40 mg Oral QPM   pravastatin   40 mg Oral QHS   Continuous Infusions:  famotidine  (PEPCID ) IV 20 mg (09/17/23 1439)   PRN Meds: levalbuterol   Allergies:    Allergies  Allergen  Reactions   Morphine And Codeine Hives   Nsaids Other (See Comments)    Gastric bypass    Social History:   Social History   Socioeconomic History   Marital status: Single    Spouse name: Not on file   Number of children: Not on file   Years of education: Not on file   Highest education level: Not on file  Occupational History   Not on file  Tobacco Use   Smoking status: Never   Smokeless tobacco: Never  Vaping Use   Vaping status: Never Used  Substance and Sexual Activity   Alcohol use: No   Drug use: No   Sexual activity: Not Currently  Other Topics Concern   Not on file  Social History Narrative   Not on file   Social Drivers of Health   Financial Resource Strain: Low Risk  (05/16/2023)   Received from [facility]   Overall Financial Resource Strain (CARDIA)    Difficulty of Paying Living Expenses: Not hard at all  Food Insecurity: No Food Insecurity (08/20/2023)   Hunger Vital Sign    Worried  About Running Out of Food in the Last Year: Never true    Ran Out of Food in the Last Year: Never true  Transportation Needs: No Transportation Needs (08/20/2023)   PRAPARE - Administrator, Civil Service (Medical): No    Lack of Transportation (Non-Medical): No  Physical Activity: Not on file  Stress: Stress Concern Present (12/03/2019)   Received from [facility], [facility]   [employer] of Occupational Health - Occupational Stress Questionnaire    Feeling of Stress : To some extent  Social Connections: Unknown (12/03/2019)   Received from [facility], [facility]   Social Connection and Isolation Panel [NHANES]    Frequency of Communication with Friends and Family: More than three times a week    Frequency of Social Gatherings with Friends and Family: Not on file    Attends Religious Services: Not on file    Active Member of Clubs or Organizations: Not on file    Attends Banker Meetings: Not on file    Marital Status: Not on file  Intimate Partner Violence: Not At Risk (08/20/2023)   Humiliation, Afraid, Rape, and Kick questionnaire    Fear of Current or Ex-Partner: No    Emotionally Abused: No    Physically Abused: No    Sexually Abused: No    Family History:    Family History  Problem Relation Age of Onset   Dementia Mother    Diabetes Father      ROS:  Please see the history of present illness.     Physical Exam/Data:   Vitals:   09/17/23 0923 09/17/23 1100 09/17/23 1230 09/17/23 1334  BP: 96/66 93/75 92/69  94/65  Pulse: (!) 54 (!) 115 (!) 127 (!) 126  Resp: (!) 24 (!) 26 (!) 24 (!) 26  Temp:    98.3 F (36.8 C)  TempSrc:    Oral  SpO2: 95% 97% 97% 100%  Weight:      Height:        Intake/Output Summary (Last 24 hours) at 09/17/2023 1451 Last data filed at 09/17/2023 1100 Gross per 24 hour  Intake 250 ml  Output 700 ml  Net -450 ml      09/17/2023     4:42 AM 08/24/2023    5:00 AM 08/23/2023  4:00 AM  Last 3 Weights  Weight (lbs) 250 lb 253 lb 8.5 oz 252 lb 10.4 oz  Weight (kg) 113.399 kg 115 kg 114.6 kg     Body mass index is 34.87 kg/m.  General:  Well nourished, well developed, in no acute distress HEENT: normal Neck: no JVD Cardiac:  IRIR, tachycardic, normal S1, S2; no murmur. Tenderness with palpation of the left chest wall and scapular region.  Lungs:  clear to auscultation bilaterally, no wheezing, rhonchi or rales  Abd: soft, nontender Ext: trace LE edema Skin: warm and dry  Psych:  Normal affect   EKG:  The EKG was personally reviewed and demonstrates: atrial fibrillation with RVR, known LBBB, rate 114 Telemetry:  Telemetry was personally reviewed and demonstrates: atrial fibrillation with RVR, rate 110-130 bpm  Relevant CV Studies:  08/20/2023 TTE 1. Left ventricular ejection fraction, by estimation, is 20 to 25%. The  left ventricle has severely decreased function. The left ventricle  demonstrates global hypokinesis. The left ventricular internal cavity size  was moderately dilated. Left  ventricular diastolic parameters are indeterminate.   2. Right ventricular systolic function is normal. The right ventricular  size is normal.   3. Left atrial size was moderately dilated.   4. The mitral valve is normal in structure. Mild to moderate mitral valve  regurgitation. No evidence of mitral stenosis.   5. The aortic valve is normal in structure. There is moderate  calcification of the aortic valve. Aortic valve regurgitation is mild.  Moderate aortic valve stenosis. Aortic valve area, by VTI measures 0.97  cm. Aortic valve mean gradient measures 16.0  mmHg. Aortic valve Vmax measures 2.81 m/s.   6. The inferior vena cava is normal in size with greater than 50%  respiratory variability, suggesting right atrial pressure of 3 mmHg.   01/03/2023 TTE 1. Left ventricular ejection fraction, by estimation, is 40 to  45%. The  left ventricle has mildly decreased function. The left ventricle  demonstrates mild global hypokinesis with severe hypokinesis of the  anterior wall. Left ventricular diastolic  parameters are consistent with Grade I diastolic dysfunction (impaired  relaxation).   2. Right ventricular systolic function is normal. The right ventricular  size is normal. Tricuspid regurgitation signal is inadequate for assessing  PA pressure.   3. Left atrial size was mildly dilated.   4. The mitral valve is normal in structure. Mild mitral valve  regurgitation. No evidence of mitral stenosis.   5. The aortic valve is normal in structure. There is severe calcifcation  of the aortic valve. Aortic valve regurgitation is mild. Moderate aortic  valve stenosis. Degree of stenosis may be underestimated in setting of  depressed EF. Aortic valve area, by  VTI measures 0.95 cm. Aortic valve mean gradient measures 22.5 mmHg.  Aortic valve Vmax measures 3.26 m/s.   6. The inferior vena cava is normal in size with greater than 50%  respiratory variability, suggesting right atrial pressure of 3 mmHg.   7. Challenging images, definity not used   Laboratory Data:  High Sensitivity Troponin:   Recent Labs  Lab 08/19/23 2232 08/20/23 0050 09/17/23 0444 09/17/23 0625  TROPONINIHS 56* 46* 23* 22*     Chemistry Recent Labs  Lab 09/17/23 0444  NA 136  K 4.3  CL 104  CO2 21*  GLUCOSE 176*  BUN 23  CREATININE 0.78  CALCIUM 8.2*  GFRNONAA >60  ANIONGAP 11    No results for input(s): PROT, ALBUMIN, AST, ALT, ALKPHOS, BILITOT  in the last 168 hours. Lipids No results for input(s): CHOL, TRIG, HDL, LABVLDL, LDLCALC, CHOLHDL in the last 168 hours.  Hematology Recent Labs  Lab 09/17/23 0444  WBC 11.6*  RBC 4.37  HGB 12.5*  HCT 39.9  MCV 91.3  MCH 28.6  MCHC 31.3  RDW 13.7  PLT 151   Thyroid   Recent Labs  Lab 09/17/23 0625  TSH <0.010*  FREET4 4.66*     BNP Recent Labs  Lab 09/17/23 0444  BNP 2,499.1*    DDimer No results for input(s): DDIMER in the last 168 hours.   Radiology/Studies:  CT Angio Chest/Abd/Pel for Dissection W and/or Wo Contrast Result Date: 09/17/2023 CLINICAL DATA:  Chest pain. EXAM: CT ANGIOGRAPHY CHEST, ABDOMEN AND PELVIS TECHNIQUE: Non-contrast CT of the chest was initially obtained. Multidetector CT imaging through the chest, abdomen and pelvis was performed using the standard protocol during bolus administration of intravenous contrast. Multiplanar reconstructed images and MIPs were obtained and reviewed to evaluate the vascular anatomy. RADIATION DOSE REDUCTION: This exam was performed according to the departmental dose-optimization program which includes automated exposure control, adjustment of the mA and/or kV according to patient size and/or use of iterative reconstruction technique. CONTRAST:  OMNIPAQUE  IOHEXOL  350 MG/ML SOLN COMPARISON:  August 20, 2023. FINDINGS: CTA CHEST FINDINGS Cardiovascular: Preferential opacification of the thoracic aorta. No evidence of thoracic aortic aneurysm or dissection. Normal heart size. No pericardial effusion. Aortic valve calcifications are noted. Coronary artery calcifications are noted. Mediastinum/Nodes: No enlarged mediastinal, hilar, or axillary lymph nodes. Thyroid  gland, trachea, and esophagus demonstrate no significant findings. Lungs/Pleura: Small bilateral pleural effusions are noted with minimal adjacent subsegmental atelectasis. No pneumothorax is noted. Musculoskeletal: No chest wall abnormality. No acute or significant osseous findings. Review of the MIP images confirms the above findings. CTA ABDOMEN AND PELVIS FINDINGS VASCULAR Aorta: Atherosclerosis of abdominal aorta is noted without aneurysm or dissection. Celiac: Patent without evidence of aneurysm, dissection, vasculitis or significant stenosis. SMA: Patent without evidence of aneurysm, dissection,  vasculitis or significant stenosis. Renals: Both renal arteries are patent without evidence of aneurysm, dissection, vasculitis, fibromuscular dysplasia or significant stenosis. IMA: Patent without evidence of aneurysm, dissection, vasculitis or significant stenosis. Inflow: Patent without evidence of aneurysm, dissection, vasculitis or significant stenosis. Veins: No obvious venous abnormality within the limitations of this arterial phase study. Review of the MIP images confirms the above findings. NON-VASCULAR Hepatobiliary: No focal liver abnormality is seen. No gallstones, gallbladder wall thickening, or biliary dilatation. Pancreas: Fatty replacement of the pancreas is noted. Spleen: Normal in size without focal abnormality. Adrenals/Urinary Tract: Adrenal glands are unremarkable. Large calcified renal cyst is noted in upper pole which is unchanged. Smaller cyst is seen more inferiorly in right kidney. No follow-up is required for these. No hydronephrosis or renal obstruction. Urinary bladder is unremarkable. Stomach/Bowel: Status post gastric bypass. Appendix appears normal. No evidence of bowel obstruction or inflammation. Lymphatic: No adenopathy. Reproductive: Prostate is unremarkable. Other: Small fat containing periumbilical hernia.  No ascites. Musculoskeletal: Status post right total hip arthroplasty. No acute osseous abnormality is noted. Review of the MIP images confirms the above findings. IMPRESSION: No evidence of thoracic or abdominal aortic dissection or aneurysm. Small pleural effusions are noted. Coronary artery calcifications are noted. Aortic Atherosclerosis (ICD10-I70.0). Electronically Signed   By: Lynwood Landy Raddle M.D.   On: 09/17/2023 08:41   DG Chest 2 View Result Date: 09/17/2023 CLINICAL DATA:  Chest pains and coughing. EXAM: CHEST - 2 VIEW COMPARISON:  Portable chest 08/20/2023  FINDINGS: 5:05 a.m. There is mild cardiomegaly. There is central vascular prominence and flow cephalization  and increased mild generalized interstitial consolidation consistent with edema, with small pleural effusions beginning to blunt the sulci. No alveolar infiltrate is seen. The mediastinum is normally outlined. Interval right PICC removal. Thoracic cage is intact. IMPRESSION: Findings consistent with CHF and mild interstitial edema. Small pleural effusions. Electronically Signed   By: Francis Quam M.D.   On: 09/17/2023 06:13     Assessment and Plan:   Atrial fibrillation with RVR - Known history of PAF with amiodarone  induced thyrotoxicosis. Chronically on Eliquis  5 mg BID.  - Received IV metoprolol  tartrate 2.5 mg - Telemetry shows atrial fibrillation with RVR, rate 110-130 bpm - Rate control limited by hypotension with systolic pressures 90-100 mmHg - Consider addition of digoxin  for rate control, although suspect patient will require further evaluation by EP for rate/rhythm control as treatment is limited by hypotension and amiodarone  intolerance. Tonight will tolerate rates 100-110 bpm.  - Continue Eliquis  5 mg BID  Chest pain - Atypical in nature with reproduction on palpation of the chest wall. Suspect chest wall muscle injury secondary to coughing.  - Troponin 23>22, EKG without ischemic changes - No further plan for ischemic evaluation at this time, consider outpatient ischemic workup  Amiodarone  induced thyrotoxicosis - Noted during most recent hospitalization 08/2023. Discharged on methimazole  15 mg TID although patient reports he has only been taking this once per day - Received IV hydrocortisone  100 mg - TSH 0.01, T4 4.66 - Continue methimazole  15 mg TID  Acute on chronic HFrEF - Dilated cardiomyopathy with echo 08/20/2023 showing LVEF 20-25% with global hypokinesis - BNP 2499 with CXR shows findings consistent with CHF and mild interstitial edema and CTA shows small bilateral pleural effusions - Appears euvolemic on exam - Anticipate need for diuresis once hemodynamics  improve  For questions or updates, please contact [facility] Please consult [email or web address] for contact info under    Signed, Lesley LITTIE Maffucci, PA-C  09/17/2023 2:51 PM

## 2023-09-17 NOTE — ED Provider Notes (Addendum)
 [facility] Provider Note    None    (approximate)   History   Chest Pain   HPI  William [name]. is a 68 y.o. male   Past medical history of atrial fibrillation on Eliquis , known left bundle branch block, congestive heart failure, diabetic, GERD, hypertension, peptic ulcer disease, who presents emergency department with chest discomfort.  He has been dealing with subacute/chronic bronchitis ever since December and had a coughing fit this morning while asleep after which he developed left-sided chest pain and back pain that he relates to a muscle strain.  It is worse with movement and tenderness to palpation in those areas. It is however quite severe.   He denies exertional symptoms.  He denies shortness of breath.  He denies fever or chills.  His cough has been ongoing and unchanged for the last 2 months.    External Medical Documents Reviewed: Discharge summary from admission in December 2024 for CHF exacerbation      Physical Exam   Triage Vital Signs: ED Triage Vitals  Encounter Vitals Group     BP 09/17/23 0443 114/82     Systolic BP Percentile --      Diastolic BP Percentile --      Pulse Rate 09/17/23 0443 (!) 120     Resp 09/17/23 0443 20     Temp 09/17/23 0443 97.9 F (36.6 C)     Temp Source 09/17/23 0443 Oral     SpO2 09/17/23 0443 98 %     Weight 09/17/23 0442 250 lb (113.4 kg)     Height 09/17/23 0442 5' 11 (1.803 m)     Head Circumference --      Peak Flow --      Pain Score 09/17/23 0442 9     Pain Loc --      Pain Education --      Exclude from Growth Chart --     Most recent vital signs: Vitals:   09/17/23 0443  BP: 114/82  Pulse: (!) 120  Resp: 20  Temp: 97.9 F (36.6 C)  SpO2: 98%    General: Awake, no distress.  CV:  Good peripheral perfusion.  Resp:  Normal effort.  Abd:  No distention.  Other:  Comfortable nontoxic appearing patient in no acute distress.  He has a tender palpation on the left-sided chest  wall and scapular area without any overlying signs of injury.  Clear lung sounds without obvious focalities or wheezing to all lung fields.  Grossly reduced ejection fraction on bedside ultrasound with no obvious B-lines, no obvious effusions.   ED Results / Procedures / Treatments   Labs (all labs ordered are listed, but only abnormal results are displayed) Labs Reviewed  BASIC METABOLIC PANEL - Abnormal; Notable for the following components:      Result Value   CO2 21 (*)    Glucose, Bld 176 (*)    Calcium 8.2 (*)    All other components within normal limits  CBC - Abnormal; Notable for the following components:   WBC 11.6 (*)    Hemoglobin 12.5 (*)    All other components within normal limits  BRAIN NATRIURETIC PEPTIDE - Abnormal; Notable for the following components:   B Natriuretic Peptide 2,499.1 (*)    All other components within normal limits  TROPONIN I (HIGH SENSITIVITY) - Abnormal; Notable for the following components:   Troponin I (High Sensitivity) 23 (*)    All other components within  normal limits  RESP PANEL BY RT-PCR (RSV, FLU A&B, COVID)  RVPGX2  TSH  T4, FREE  TROPONIN I (HIGH SENSITIVITY)     I ordered and reviewed the above labs they are notable for white blood cell count is mildly elevated to 11.6.  Initial troponin is 23 at baseline  EKG  ED ECG REPORT I, Ginnie Shams, the attending physician, personally viewed and interpreted this ECG.   Date: 09/17/2023  EKG Time: 0445  Rate: 114  Rhythm: AF   Axis: nl  Intervals: lbbb (known)  ST&T Change: no stemi  RADIOLOGY I independently reviewed and interpreted chest x-ray and I see no obvious focality or pneumothorax I also reviewed radiologist's formal read.   PROCEDURES:  Critical Care performed: No  Procedures   MEDICATIONS ORDERED IN ED: Medications  furosemide  (LASIX ) injection 40 mg (has no administration in time range)  fentaNYL  (SUBLIMAZE ) injection 50 mcg (50 mcg Intravenous Given  09/17/23 9379)    IMPRESSION / MDM / ASSESSMENT AND PLAN / ED COURSE  I reviewed the triage vital signs and the nursing notes.                                Patient's presentation is most consistent with acute presentation with potential threat to life or bodily function.  Differential diagnosis includes, but is not limited to, muscle strain, costochondritis, ACS, PE, dissection, respiratory infection, pneumothorax, rib fracture   The patient is on the cardiac monitor to evaluate for evidence of arrhythmia and/or significant heart rate changes.  MDM:    He has multiple medical comorbidities and CHF, high risk for cardiac disease however tonight's chest pain is most likely due to muscle strain in the setting of chronic bronchitis and coughing fit.  He is tender to palpation to the areas in front of his chest and subscapular left-sided that is worse with movements.  However it is quite severe and he has a tearing sensation to his frontal chest rating to his back and so I am concerned for dissection.  I have ordered IV fentanyl  and a CTA angiogram dissection protocol.  Also check serial troponins, respiratory infectious workup including viral swabs and chest x-ray.   He is on Eliquis  so I think blood clot is unlikely.    His EKG is abnormal but a left bundle branch block is pre-existing there are no ischemic changes and his tachycardia is likely driven by his pain.    He has mild peripheral edema but he notes that this is significantly reduced as he has been diuresing well at home has no shortness of breath.  His BNP is elevated and his chest x-ray does show some evidence of pulmonary edema, so we will give him IV Lasix  though I do not think that this is the cause of his chief complaint of chest pain.     At the time of signout, workup pending including serial troponins, CT angiogram.      FINAL CLINICAL IMPRESSION(S) / ED DIAGNOSES   Final diagnoses:  Acute chest wall pain      Rx / DC Orders   ED Discharge Orders     None        Note:  This document was prepared using Dragon voice recognition software and may include unintentional dictation errors.    Shams Ginnie, MD 09/17/23 9390    Shams Ginnie, MD 09/17/23 [phone]

## 2023-09-17 NOTE — ED Notes (Signed)
 This nurse called portable for more IV channels

## 2023-09-17 NOTE — ED Notes (Signed)
 Family at bedside.

## 2023-09-17 NOTE — Assessment & Plan Note (Signed)
 Noted recurrent cough over multiple weeks with recent evaluation for issues including A-fib with RVR, acute on chronic HFrEF, amiodarone  induced hyperthyroidism CT of the chest today grossly stable apart from pleural effusions Currently not on ACE inhibitor Does report recent URI symptoms Will otherwise monitor for now Add as needed DuoNebs PPI Consider pulmonary follow-up if persists given prior amiodarone  toxicity with potential for pulmonary impact

## 2023-09-17 NOTE — Assessment & Plan Note (Signed)
 PPI ?

## 2023-09-17 NOTE — ED Notes (Signed)
 Leonor Liv [phone]

## 2023-09-17 NOTE — Assessment & Plan Note (Addendum)
 Nonspecific chest pain over multiple days in the setting of acute on chronic HFrEF, paroxysmal atrial fibrillation as well as amiodarone  induced hyperthyroidism Troponin 20s x 2 EKG with A-fib with RVR Recent echo December 2024 with a EF of 20 to 25% CT of the chest abdomen pelvis grossly stable Fairly mixed presentation Will otherwise continue to monitor for now with current treatment Follow cardiology recommendations

## 2023-09-17 NOTE — Assessment & Plan Note (Addendum)
 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 Adrenal insufficiency  Will add on cortisol level to correlate  Otherwise monitor closely

## 2023-09-17 NOTE — Assessment & Plan Note (Signed)
 Baseline atrial fibrillation on Eliquis  with heart rate into the 120s Deferring rate control at present given systolic pressures in the 90s to 100s as well as history of amiodarone  induced thyrotoxicosis Will formally consult cardiology for further management

## 2023-09-17 NOTE — ED Provider Notes (Signed)
 Care assumed of patient from outgoing provider.  See their note for initial history, exam and plan.  Clinical Course as of 09/17/23 1546  Wed Sep 17, 2023  0702 Afib on eliquis , CHF, ongoing coughing episode with severe chest pain that's worse with movement.  CTA for dissection.  Lasix  for mild CHF exacerbation. EKG w/o ischemia.  CTA neg and neg trop plan to dc home with treatment for MSK.  [SM]    Clinical Course User Index [SM] Suzanne Kirsch, MD   .Critical Care  Performed by: Suzanne Kirsch, MD Authorized by: Suzanne Kirsch, MD   Critical care provider statement:    Critical care time (minutes):  30   Critical care time was exclusive of:  Separately billable procedures and treating other patients   Critical care was necessary to treat or prevent imminent or life-threatening deterioration of the following conditions:  Cardiac failure   Critical care was time spent personally by me on the following activities:  Development of treatment plan with patient or surrogate, discussions with consultants, evaluation of patient's response to treatment, examination of patient, ordering and review of laboratory studies, ordering and review of radiographic studies, ordering and performing treatments and interventions, pulse oximetry, re-evaluation of patient's condition and review of old charts  Patient CTA was negative for dissection, no obvious pulmonary edema or signs of pneumonia.  On reevaluation patient continues to be tachycardic with A-fib with RVR.  Multiple blood pressures were reading low at 70 systolic.  Given a 500 bolus with mild improvement of his blood pressure.  Given that the patient has recently been on prednisone  for concern of amiodarone  induced hyperthyroidism we will give a stress dose of steroid.  On chart review patient has had low blood pressure as an outpatient on recent admission for A-fib with RVR.  On chart review his blood pressure appears to be around 100 systolic.  Patient  does state that he is having ongoing chest pain, feeling dizzy and not feeling well.  Given IV metoprolol .  Consulted hospitalist for admission for A-fib with RVR.   Suzanne Kirsch, MD 09/17/23 1546

## 2023-09-17 NOTE — H&P (Addendum)
 History and Physical    Patient: William [name]. FMW:[id number] DOB: 05/28/1956 DOA: 09/17/2023 DOS: the patient was seen and examined on 09/17/2023 PCP: Fernande Ophelia JINNY DOUGLAS, MD  Patient coming from: SNF  Chief Complaint:  Chief Complaint  Patient presents with   Chest Pain   HPI: William [name]. is a 68 y.o. male with medical history significant of HFrEF, atrial fibrillation, type 2 diabetes, aortic stenosis, history of gastric bypass, amiodarone  induced hyperthyroidism presenting with cough, chest pain, acute on chronic HFrEF, A-fib with RVR.  Patient reports progressive cough over multiple days.  Noted to have been admitted December 2024 for issues including decompensated heart failure, A-fib with RVR as well as amiodarone  induced hypothyroidism.  Has been on methimazole  as well as prednisone  taper in addition to Eliquis , metoprolol  as well as Lasix .  Patient states he was discharged with a cough that progressively improved but then worsened acutely over the past 1 to 2 weeks.  Has had mild nonspecific chest pain over the past few days.  No fevers or chills.  No nausea or vomiting.  No reported diarrhea.  No focal hemiparesis or confusion.  Denies any orthopnea or PND.  Noted to have had lower extremity as well as groin edema that has somewhat improved since discharge.  Has been compliant with home medication regimen.  No reported NSAID or high salt intake. Presented to the ER afebrile, heart rate into the 110s to 120s, blood pressure 70s to 100s over 70s to 90s.  Satting well on room air.  White count 11.6, hemoglobin 12.5, TSH of less than 0.01, free T4 at 4.66, troponin of 22.  COVID flu and RSV negative.  Creatinine 0.78, glucose 176.  EKG A-fib with heart rate into the 120s.  Chest x-ray with concern for CHF.  CT angio of the chest abdomen pelvis negative for any thoracic or abdominal findings apart from small pleural effusions and coronary artery calcification. Review of Systems: As mentioned in  the history of present illness. All other systems reviewed and are negative. 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   CHF (congestive heart failure) (HCC)    Diabetes mellitus without complication (HCC)    GERD (gastroesophageal reflux disease)    Hypertension    Iron deficiency anemia    Obesity    Osteoarthritis of hip    right   Peptic ulcer disease    Past Surgical History:  Procedure Laterality Date   CARDIOVERSION N/A 11/26/2019   Procedure: CARDIOVERSION;  Surgeon: Bosie Vinie LABOR, MD;  Location: [facility];  Service: Cardiovascular;  Laterality: N/A;   COLONOSCOPY     ESOPHAGOGASTRODUODENOSCOPY     GASTRIC BYPASS     2009   NASAL SINUS SURGERY     TOTAL HIP ARTHROPLASTY Right 08/20/2016   Procedure: TOTAL HIP ARTHROPLASTY ANTERIOR APPROACH;  Surgeon: Ozell Flake, MD;  Location: [facility] ORS;  Service: Orthopedics;  Laterality: Right;   Social History:  reports that he has never smoked. He has never used smokeless tobacco. He reports that he does not drink alcohol and does not use drugs.  Allergies  Allergen Reactions   Morphine And Codeine Hives   Nsaids Other (See Comments)    Gastric bypass    Family History  Problem Relation Age of Onset   Dementia Mother    Diabetes Father     Prior to Admission medications   Medication  Sig Start Date End Date Taking? Authorizing Provider  albuterol (VENTOLIN HFA) 108 (90 Base) MCG/ACT inhaler Inhale 2 puffs into the lungs every 6 (six) hours as needed for wheezing. 08/27/23  Yes [provider]  amoxicillin (AMOXIL) 500 MG capsule Take 2,000 mg by mouth once as needed (Pretreatment for Procedure(s)). 06/12/23   [provider]  betamethasone valerate (VALISONE) 0.1 % cream Apply 1 Application topically 2 (two) times daily as needed. 04/11/23   [provider]  Cyanocobalamin  (B-12) 2500 MCG TABS Take 5,000 mcg by mouth daily.     [provider]   ELIQUIS  5 MG TABS tablet TAKE 1 TABLET BY MOUTH TWICE  DAILY 02/11/23   [place]ette [place] A, FNP  FLUoxetine  (PROZAC ) 40 MG capsule Take 1 capsule by mouth daily. 05/23/23   [provider]  furosemide  (LASIX ) 40 MG tablet Take 0.5 tablets (20 mg total) by mouth daily. 08/24/23   Jens Durand, MD  ipratropium (ATROVENT ) 0.03 % nasal spray Place 2 sprays into the nose 2 (two) times daily. 08/14/23 08/28/23  [provider]  JARDIANCE  25 MG TABS tablet Take 25 mg by mouth daily. 11/02/19   [provider]  metFORMIN  (GLUCOPHAGE ) 1000 MG tablet Take 1,000 mg by mouth 2 (two) times daily with a meal.     [provider]  methimazole  (TAPAZOLE ) 5 MG tablet Take 3 tablets (15 mg total) by mouth 3 (three) times daily. 08/24/23   Jens Durand, MD  metoprolol  succinate (TOPROL -XL) 50 MG 24 hr tablet Take 1 tablet (50 mg total) by mouth daily. Take with or immediately following a meal. 08/24/23   Jens Durand, MD  Multiple Vitamin (MULTIVITAMIN WITH MINERALS) TABS tablet Take 1 tablet by mouth daily.    [provider]  pantoprazole  (PROTONIX ) 40 MG tablet Take 40 mg by mouth every evening.     [provider]  potassium chloride  SA (KLOR-CON  M) 20 MEQ tablet Take 1 tablet (20 mEq total) by mouth daily for 5 days. 08/24/23 08/29/23  Jens Durand, MD  pravastatin  (PRAVACHOL ) 40 MG tablet Take 40 mg by mouth at bedtime.  09/22/19   [provider]  tirzepatide CLOYDE) 12.5 MG/0.5ML Pen Inject 12.5 mg into the skin once a week. 05/16/23   [provider]  traZODone  (DESYREL ) 50 MG tablet Take 1 tablet (50 mg total) by mouth at bedtime. 08/24/23   Jens Durand, MD  triamcinolone  cream (KENALOG ) 0.1 % Apply 1 Application topically 2 (two) times daily as needed. 04/07/23   [provider]    Physical Exam: Vitals:   09/17/23 0923 09/17/23 1100 09/17/23 1230 09/17/23 1334  BP: 96/66 93/75 92/69  94/65  Pulse: (!) 54 (!) 115 (!) 127  (!) 126  Resp: (!) 24 (!) 26 (!) 24 (!) 26  Temp:    98.3 F (36.8 C)  TempSrc:    Oral  SpO2: 95% 97% 97% 100%  Weight:      Height:       Physical Exam Constitutional:      Appearance: He is obese.  HENT:     Head: Normocephalic and atraumatic.     Nose: Nose normal.     Mouth/Throat:     Mouth: Mucous membranes are moist.  Eyes:     Pupils: Pupils are equal, round, and reactive to light.  Cardiovascular:     Rate and Rhythm: Normal rate and regular rhythm.  Pulmonary:     Effort: Pulmonary effort is normal.  Abdominal:  General: Bowel sounds are normal.  Musculoskeletal:        General: Normal range of motion.  Skin:    General: Skin is warm.  Neurological:     General: No focal deficit present.  Psychiatric:        Mood and Affect: Mood normal.     Data Reviewed:  There are no new results to review at this time.  CT Angio Chest/Abd/Pel for Dissection W and/or Wo Contrast CLINICAL DATA:  Chest pain.  EXAM: CT ANGIOGRAPHY CHEST, ABDOMEN AND PELVIS  TECHNIQUE: Non-contrast CT of the chest was initially obtained.  Multidetector CT imaging through the chest, abdomen and pelvis was performed using the standard protocol during bolus administration of intravenous contrast. Multiplanar reconstructed images and MIPs were obtained and reviewed to evaluate the vascular anatomy.  RADIATION DOSE REDUCTION: This exam was performed according to the departmental dose-optimization program which includes automated exposure control, adjustment of the mA and/or kV according to patient size and/or use of iterative reconstruction technique.  CONTRAST:  OMNIPAQUE  IOHEXOL  350 MG/ML SOLN  COMPARISON:  August 20, 2023.  FINDINGS: CTA CHEST FINDINGS  Cardiovascular: Preferential opacification of the thoracic aorta. No evidence of thoracic aortic aneurysm or dissection. Normal heart size. No pericardial effusion. Aortic valve calcifications are noted. Coronary  artery calcifications are noted.  Mediastinum/Nodes: No enlarged mediastinal, hilar, or axillary lymph nodes. Thyroid  gland, trachea, and esophagus demonstrate no significant findings.  Lungs/Pleura: Small bilateral pleural effusions are noted with minimal adjacent subsegmental atelectasis. No pneumothorax is noted.  Musculoskeletal: No chest wall abnormality. No acute or significant osseous findings.  Review of the MIP images confirms the above findings.  CTA ABDOMEN AND PELVIS FINDINGS  VASCULAR  Aorta: Atherosclerosis of abdominal aorta is noted without aneurysm or dissection.  Celiac: Patent without evidence of aneurysm, dissection, vasculitis or significant stenosis.  SMA: Patent without evidence of aneurysm, dissection, vasculitis or significant stenosis.  Renals: Both renal arteries are patent without evidence of aneurysm, dissection, vasculitis, fibromuscular dysplasia or significant stenosis.  IMA: Patent without evidence of aneurysm, dissection, vasculitis or significant stenosis.  Inflow: Patent without evidence of aneurysm, dissection, vasculitis or significant stenosis.  Veins: No obvious venous abnormality within the limitations of this arterial phase study.  Review of the MIP images confirms the above findings.  NON-VASCULAR  Hepatobiliary: No focal liver abnormality is seen. No gallstones, gallbladder wall thickening, or biliary dilatation.  Pancreas: Fatty replacement of the pancreas is noted.  Spleen: Normal in size without focal abnormality.  Adrenals/Urinary Tract: Adrenal glands are unremarkable. Large calcified renal cyst is noted in upper pole which is unchanged. Smaller cyst is seen more inferiorly in right kidney. No follow-up is required for these. No hydronephrosis or renal obstruction. Urinary bladder is unremarkable.  Stomach/Bowel: Status post gastric bypass. Appendix appears normal. No evidence of bowel obstruction or  inflammation.  Lymphatic: No adenopathy.  Reproductive: Prostate is unremarkable.  Other: Small fat containing periumbilical hernia.  No ascites.  Musculoskeletal: Status post right total hip arthroplasty. No acute osseous abnormality is noted.  Review of the MIP images confirms the above findings.  IMPRESSION: No evidence of thoracic or abdominal aortic dissection or aneurysm.  Small pleural effusions are noted.  Coronary artery calcifications are noted.  Aortic Atherosclerosis (ICD10-I70.0).  Electronically Signed   By: Lynwood Landy Raddle M.D.   On: 09/17/2023 08:41 DG Chest 2 View CLINICAL DATA:  Chest pains and coughing.  EXAM: CHEST - 2 VIEW  COMPARISON:  Portable chest 08/20/2023  FINDINGS: 5:05 a.m. There is mild cardiomegaly. There is central vascular prominence and flow cephalization and increased mild generalized interstitial consolidation consistent with edema, with small pleural effusions beginning to blunt the sulci.  No alveolar infiltrate is seen. The mediastinum is normally outlined. Interval right PICC removal. Thoracic cage is intact.  IMPRESSION: Findings consistent with CHF and mild interstitial edema. Small pleural effusions.  Electronically Signed   By: Francis Quam M.D.   On: 09/17/2023 06:13  Lab Results  Component Value Date   WBC 11.6 (H) 09/17/2023   HGB 12.5 (L) 09/17/2023   HCT 39.9 09/17/2023   MCV 91.3 09/17/2023   PLT 151 09/17/2023   Last metabolic panel Lab Results  Component Value Date   GLUCOSE 176 (H) 09/17/2023   NA 136 09/17/2023   K 4.3 09/17/2023   CL 104 09/17/2023   CO2 21 (L) 09/17/2023   BUN 23 09/17/2023   CREATININE 0.78 09/17/2023   GFRNONAA >60 09/17/2023   CALCIUM 8.2 (L) 09/17/2023   PHOS 4.8 (H) 08/23/2023   PROT 6.8 11/24/2019   ALBUMIN 3.8 11/24/2019   BILITOT 0.8 11/24/2019   ALKPHOS 75 11/24/2019   AST 41 11/24/2019   ALT 64 (H) 11/24/2019   ANIONGAP 11 09/17/2023    Assessment and  Plan: * Acute chest wall pain Nonspecific chest pain over multiple days in the setting of acute on chronic HFrEF, paroxysmal atrial fibrillation as well as amiodarone  induced hyperthyroidism Troponin 20s x 2 EKG with A-fib with RVR Recent echo December 2024 with a EF of 20 to 25% CT of the chest abdomen pelvis grossly stable Fairly mixed presentation Will otherwise continue to monitor for now with current treatment Follow cardiology recommendations  Acute on chronic HFrEF (heart failure with reduced ejection fraction) (HCC) 2D echo December 2024 with a EF of 20 to 25% Positive recurrent cough with intermittent chest pain BNP 2500 today which is well above baseline Positive pleural effusions on CT of the chest abdomen pelvis Fairly narrow diuresis given chronic hypotension Will formally consult cardiology to better assess Monitor   Atrial fibrillation with RVR (HCC) Baseline atrial fibrillation on Eliquis  with heart rate into the 120s Deferring rate control at present given systolic pressures in the 90s to 100s as well as history of amiodarone  induced thyrotoxicosis Will formally consult cardiology for further management   Hyperthyroidism Noted admission December 2024 for amiodarone  induced hypothyroidism On methimazole  and recent course of prednisone  TSH <0.10  Free T4 4.66  Will continue methimazole -may need dose modification  S/p stress dose of hydrocortisone  in the ER out of concern for hypotension and ? Adrenal insufficiency  Will add on cortisol level to correlate  Otherwise monitor closely  Cough Noted recurrent cough over multiple weeks with recent evaluation for issues including A-fib with RVR, acute on chronic HFrEF, amiodarone  induced hyperthyroidism CT of the chest today grossly stable apart from pleural effusions Currently not on ACE inhibitor Does report recent URI symptoms Will otherwise monitor for now Add as needed DuoNebs PPI Consider pulmonary follow-up  if persists given prior amiodarone  toxicity with potential for pulmonary impact  GERD (gastroesophageal reflux disease) PPI  Type 2 diabetes mellitus without complication (HCC) Blood sugars 170s SSI  Monitor   Greater than 50% was spent in counseling and coordination of care with patient Total encounter time 80 minutes or more    Advance Care Planning:   Code Status: Prior   Consults: Cardiology   Family Communication: No family at the bedside  Severity of Illness: The appropriate patient status for this patient is INPATIENT. Inpatient status is judged to be reasonable and necessary in order to provide the required intensity of service to ensure the patient's safety. The patient's presenting symptoms, physical exam findings, and initial radiographic and laboratory data in the context of their chronic comorbidities is felt to place them at high risk for further clinical deterioration. Furthermore, it is not anticipated that the patient will be medically stable for discharge from the hospital within 2 midnights of admission.   * I certify that at the point of admission it is my clinical judgment that the patient will require inpatient hospital care spanning beyond 2 midnights from the point of admission due to high intensity of service, high risk for further deterioration and high frequency of surveillance required.*  Author: Elspeth JINNY Masters, MD 09/17/2023 2:51 PM  For on call review [email or web address].

## 2023-09-17 NOTE — Assessment & Plan Note (Signed)
 Blood sugars 170s SSI  Monitor

## 2023-09-17 NOTE — Assessment & Plan Note (Signed)
 2D echo December 2024 with a EF of 20 to 25% Positive recurrent cough with intermittent chest pain BNP 2500 today which is well above baseline Positive pleural effusions on CT of the chest abdomen pelvis Fairly narrow diuresis given chronic hypotension Will formally consult cardiology to better assess Monitor

## 2023-09-17 NOTE — ED Notes (Signed)
 This nurse gave pt urinal

## 2023-09-17 NOTE — ED Notes (Signed)
 Pt ambulated to bedside commode with steady gate. Pt was returned to bed and hooked up to cardiac monitors with no incident. CB within reach.

## 2023-09-18 ENCOUNTER — Other Ambulatory Visit: Payer: Self-pay

## 2023-09-18 DIAGNOSIS — R0789 Other chest pain: Secondary | ICD-10-CM

## 2023-09-18 DIAGNOSIS — I5023 Acute on chronic systolic (congestive) heart failure: Secondary | ICD-10-CM | POA: Diagnosis not present

## 2023-09-18 LAB — CBC
HCT: 35.5 % — ABNORMAL LOW (ref 39.0–52.0)
Hemoglobin: 11.5 g/dL — ABNORMAL LOW (ref 13.0–17.0)
MCH: 28.3 pg (ref 26.0–34.0)
MCHC: 32.4 g/dL (ref 30.0–36.0)
MCV: 87.2 fL (ref 80.0–100.0)
Platelets: 163 10*3/uL (ref 150–400)
RBC: 4.07 MIL/uL — ABNORMAL LOW (ref 4.22–5.81)
RDW: 13.9 % (ref 11.5–15.5)
WBC: 9.9 10*3/uL (ref 4.0–10.5)
nRBC: 0 % (ref 0.0–0.2)

## 2023-09-18 LAB — COMPREHENSIVE METABOLIC PANEL
ALT: 24 U/L (ref 0–44)
AST: 17 U/L (ref 15–41)
Albumin: 3 g/dL — ABNORMAL LOW (ref 3.5–5.0)
Alkaline Phosphatase: 51 U/L (ref 38–126)
Anion gap: 13 (ref 5–15)
BUN: 27 mg/dL — ABNORMAL HIGH (ref 8–23)
CO2: 21 mmol/L — ABNORMAL LOW (ref 22–32)
Calcium: 8.2 mg/dL — ABNORMAL LOW (ref 8.9–10.3)
Chloride: 104 mmol/L (ref 98–111)
Creatinine, Ser: 0.89 mg/dL (ref 0.61–1.24)
GFR, Estimated: >60 mL/min (ref >60)
Glucose, Bld: 153 mg/dL — ABNORMAL HIGH (ref 70–99)
Potassium: 3.9 mmol/L (ref 3.5–5.1)
Sodium: 138 mmol/L (ref 135–145)
Total Bilirubin: 0.9 mg/dL (ref 0.0–1.2)
Total Protein: 5.9 g/dL — ABNORMAL LOW (ref 6.5–8.1)

## 2023-09-18 LAB — GLUCOSE, CAPILLARY
Glucose-Capillary: 178 mg/dL — ABNORMAL HIGH (ref 70–99)
Glucose-Capillary: 189 mg/dL — ABNORMAL HIGH (ref 70–99)

## 2023-09-18 LAB — CBG MONITORING, ED
Glucose-Capillary: 133 mg/dL — ABNORMAL HIGH (ref 70–99)
Glucose-Capillary: 222 mg/dL — ABNORMAL HIGH (ref 70–99)

## 2023-09-18 LAB — MAGNESIUM: Magnesium: 2.3 mg/dL (ref 1.7–2.4)

## 2023-09-18 MED ORDER — POTASSIUM CHLORIDE CRYS ER 20 MEQ PO TBCR
40.0000 meq | EXTENDED_RELEASE_TABLET | Freq: Once | ORAL | Status: AC
  Administered 2023-09-18: 40 meq via ORAL
  Filled 2023-09-18: qty 2

## 2023-09-18 MED ORDER — SODIUM CHLORIDE 0.9% FLUSH: 3.0000 mL | INTRAVENOUS | Status: DC | PRN

## 2023-09-18 MED ORDER — FUROSEMIDE 10 MG/ML IJ SOLN
80.0000 mg | Freq: Two times a day (BID) | INTRAMUSCULAR | Status: DC
  Administered 2023-09-18 – 2023-09-23 (×12): 80 mg via INTRAVENOUS
  Filled 2023-09-18 (×13): qty 8

## 2023-09-18 MED ORDER — PREDNISONE 20 MG PO TABS
40.0000 mg | ORAL_TABLET | Freq: Every day | ORAL | Status: DC
  Administered 2023-09-18 – 2023-09-19 (×2): 40 mg via ORAL
  Filled 2023-09-18 (×2): qty 2

## 2023-09-18 MED ORDER — SODIUM CHLORIDE 0.9% FLUSH
3.0000 mL | Freq: Two times a day (BID) | INTRAVENOUS | Status: DC
  Administered 2023-09-18: 10 mL via INTRAVENOUS

## 2023-09-18 MED ORDER — ZOLPIDEM TARTRATE 5 MG PO TABS
5.0000 mg | ORAL_TABLET | Freq: Every evening | ORAL | Status: DC | PRN
  Administered 2023-09-18 – 2023-09-24 (×7): 5 mg via ORAL
  Filled 2023-09-18 (×7): qty 1

## 2023-09-18 NOTE — Hospital Course (Addendum)
 Hospital course / significant events:   HPI:  William [name]. is a 68 y.o. male with medical history significant of HFrEF, atrial fibrillation, type 2 diabetes, aortic stenosis, history of gastric bypass, amiodarone  induced hyperthyroidism presenting with cough, chest pain. Had been admitted 08/2023 for decompensated heart failure, A-fib with RVR, amiodarone  induced hypothyroidism. Has been on methimazole , prednisone  taper, Eliquis , metoprolol , Lasix . Patient states he was discharged with a cough that progressively improved but then worsened acutely over the past 1 to 2 weeks. Has had mild nonspecific chest pain over the past few days.    01/08: admitted for chest pain r/o ACS, exacerbation HFrEF, Afib w/ RVR - given soft BP have held rate control and diuresing w/ caution. Cardiology consulted - starting IV digoxin  and transition to po tomorrow, cautious lasix , GDMT limited by hypotension. Of note, taking methimazole  5 mg tid not 15 mg tid as rx.  01/09: HR still elevated, cardio recs for DCCV planning this for tomorrow  01/10: delay in DCCV - cannot safely sedate given recent mounjaro per anesthesia - at this point given high risk for general anesthesia planis to wait the standard 7 days from last dose which puts him at 09/23/23 unless he decompensates in the meantime.  01/11-01/12: continuing diuresis, Planning for TEE/DCCV on 1/14 after Mounjaro has been held for 1 week. BP remains low  01/13: runs of limited VT, cardiology aware, pt has no symptoms 01/14: DCCV unsuccessful, planning reattempt       Consultants:  Cardiology [facility])  Procedures/Surgeries: none      ASSESSMENT & PLAN:   Atrial fibrillation with RVR History of PAF, 2021  suspected type 2 amiodarone  induced thyrotoxicity dx 08/2023, then was taking a lower dose of methimazole  than prescribed and also did not get prednisone  at home.  Blood pressure has been borderline low, limiting rate control  Cardiology following   Continue digoxin .  Continue Toprol  XL Continue Eliquis  Continue hyperthyroidism treatment with methimazole  and prednisone  DCCV to reattempt per cardiology  Ultimately, plan eval for AF ablation.   Intermittent Vtach  Asymptomatic Cardiology team aware Continue telemetry Supplement K and Mg   Acute chest wall pain - resolved  Nonspecific likely multifactorial in setting of HF, Afib No ACS per cardiology monitor   Acute on chronic HFrEF (heart failure with reduced ejection fraction) 2D echo December 2024 with a EF of 20 to 25% Cardiology following Continue Lasix  Continue beta-blocker, Jardiance , and digoxin  Low blood pressure limiting GDMT Needs repeat echo in 3 months to assess for recovery of LVEF and ICD candidacy.     Hyperthyroidism admission December 2024 for amiodarone  induced hypothyroidism On methimazole  and recent course of prednisone  TSH <0.10  Free T4 4.66  continue methimazole  S/p stress dose of hydrocortisone  in the ER out of concern for hypotension and ? Adrenal insufficiency    Cough, question URI vs amiodarone  tox  Noted recurrent cough over multiple weeks with recent evaluation for issues including A-fib with RVR, acute on chronic HFrEF, amiodarone  induced hyperthyroidism CT of the chest on admission grossly stable apart from pleural effusions Add as needed DuoNebs PPI Consider pulmonary follow-up if persists given prior amiodarone  toxicity with potential for pulmonary impact   GERD (gastroesophageal reflux disease) PPI   Type 2 diabetes mellitus without complication (HCC) Blood sugars 170s SSI  Monitor  Hypokalemia Replace as needed Monitor BMP   Class 1 obesity based on BMI: Body mass index is 32.38 kg/m.  Underweight - under 18  overweight - 25  to 29 obese - 30 or more Class 1 obesity: BMI of 30.0 to 34 Class 2 obesity: BMI of 35.0 to 39 Class 3 obesity: BMI of 40.0 to 49 Super Morbid Obesity: BMI 50-59 Super-super Morbid Obesity:  BMI 60+ Significantly low or high BMI is associated with higher medical risk.  Weight management advised as adjunct to other disease management and risk reduction treatments    DVT prophylaxis: eliquis  IV fluids: no continuous IV fluids  Nutrition: cardaic diet Central lines / invasive devices: noen  Code Status: FULL CODE ACP documentation reviewed:  none on file in [facility]  TOC needs: TBD but expect d/c back home with no home health needs  Barriers to dispo / significant pending items: DCCV per cardiology

## 2023-09-18 NOTE — Progress Notes (Signed)
 PROGRESS NOTE    William [name].   FMW:[id number] DOB: September 04, 1956  DOA: 09/17/2023 Date of Service: 09/18/23 which is hospital day 1  PCP: Fernande Ophelia JINNY DOUGLAS, MD    Hospital course / significant events:   HPI:  William [name]. is a 68 y.o. male with medical history significant of HFrEF, atrial fibrillation, type 2 diabetes, aortic stenosis, history of gastric bypass, amiodarone  induced hyperthyroidism presenting with cough, chest pain. Had been admitted 08/2023 for decompensated heart failure, A-fib with RVR, amiodarone  induced hypothyroidism. Has been on methimazole , prednisone  taper, Eliquis , metoprolol , Lasix . Patient states he was discharged with a cough that progressively improved but then worsened acutely over the past 1 to 2 weeks. Has had mild nonspecific chest pain over the past few days.    01/08: admitted for chest pain r/o ACS, exacerbation HFrEF, Afib w/ RVR - given soft BP have held rate control and diuresing w/ caution. Cardiology consulted - starting IV digoxin  and transition to po tomorrow, cautious lasix , GDMT limited by hypotension. Of note, taking methimazole  5 mg tid not 15 mg tid as rx.  01/09: HR still elevated, cardio recs for DCCV planning this for tomorrow      Consultants:  [facility])  Procedures/Surgeries: none      ASSESSMENT & PLAN:   Acute chest wall pain Nonspecific likely multifactorial in setting of HF, Afib No ACS per cardiology monitor   Acute on chronic HFrEF (heart failure with reduced ejection fraction) (HCC) 2D echo December 2024 with a EF of 20 to 25% Cardiology following Digoxin  IV --> po per cardiology Continue Lasix  IV Low blood pressure limiting GDMT   Atrial fibrillation with RVR History of PAF, 2021  off amiodarone  December 2024 in the setting of hyperthyroidism Blood pressure has been borderline low, cardiology has started digoxin  0.25 IV x 2, transition to oral dosing If blood pressure allows restart metoprolol   succinate 25 daily with titration up as tolerated Thyroid  management as below   Hyperthyroidism admission December 2024 for amiodarone  induced hypothyroidism On methimazole  and recent course of prednisone  TSH <0.10  Free T4 4.66  Will continue methimazole  S/p stress dose of hydrocortisone  in the ER out of concern for hypotension and ? Adrenal insufficiency    Cough, question URI  Noted recurrent cough over multiple weeks with recent evaluation for issues including A-fib with RVR, acute on chronic HFrEF, amiodarone  induced hyperthyroidism CT of the chest on admission grossly stable apart from pleural effusions Add as needed DuoNebs PPI Consider pulmonary follow-up if persists given prior amiodarone  toxicity with potential for pulmonary impact   GERD (gastroesophageal reflux disease) PPI   Type 2 diabetes mellitus without complication (HCC) Blood sugars 170s SSI  Monitor   Class 1 obesity based on BMI: Body mass index is 34.87 kg/m.  Underweight - under 18  overweight - 25 to 29 obese - 30 or more Class 1 obesity: BMI of 30.0 to 34 Class 2 obesity: BMI of 35.0 to 39 Class 3 obesity: BMI of 40.0 to 49 Super Morbid Obesity: BMI 50-59 Super-super Morbid Obesity: BMI 60+ Significantly low or high BMI is associated with higher medical risk.  Weight management advised as adjunct to other disease management and risk reduction treatments    DVT prophylaxis: eliquis  IV fluids: no continuous IV fluids  Nutrition: cardaic diet Central lines / invasive devices: noen  Code Status: FULL CODE ACP documentation reviewed:  none on file in [facility]  TOC needs: TBD but expect d/c back  home with no home health needs  Barriers to dispo / significant pending items: DCCV tomorrow per cardiology              Subjective / Brief ROS:  Patient reports no concerns other than some questions about cardioversion  Denies CP/SOB.  Pain controlled.  Denies new weakness.  Tolerating  diet.  Reports no concerns w/ urination/defecation.   Family Communication: family at beside and on phone w/ patient on rounds     Objective Findings:  Vitals:   09/18/23 0811 09/18/23 0930 09/18/23 1000 09/18/23 1230  BP:  114/83 105/75 106/72  Pulse:  (!) 102 (!) 128 (!) 111  Resp:  (!) 21 18 20   Temp: 98.1 F (36.7 C)   97.8 F (36.6 C)  TempSrc: Oral   Oral  SpO2:  97% 96% 97%  Weight:      Height:       No intake or output data in the 24 hours ending 09/18/23 1638  Filed Weights   09/17/23 0442  Weight: 113.4 kg    Examination:  Physical Exam Constitutional:      General: He is not in acute distress. Cardiovascular:     Rate and Rhythm: Tachycardia present. Rhythm irregular.  Pulmonary:     Breath sounds: Normal breath sounds.  Musculoskeletal:     Right lower leg: No edema.     Left lower leg: No edema.  Neurological:     General: No focal deficit present.     Mental Status: He is alert and oriented to person, place, and time.  Psychiatric:        Mood and Affect: Mood normal.        Behavior: Behavior normal.          Scheduled Medications:   apixaban   5 mg Oral BID   digoxin   0.25 mg Oral Daily   empagliflozin   25 mg Oral Daily   FLUoxetine   40 mg Oral Daily   furosemide   80 mg Intravenous BID   insulin  aspart  0-9 Units Subcutaneous TID WC   methimazole   15 mg Oral TID   metoprolol  succinate  25 mg Oral Daily   pantoprazole   40 mg Oral QPM   pravastatin   40 mg Oral QHS   predniSONE   40 mg Oral Q breakfast   sodium chloride  flush  3 mL Intravenous Q12H   sodium chloride  flush  3-10 mL Intravenous Q12H    Continuous Infusions:    PRN Medications:  levalbuterol , ondansetron  **OR** ondansetron  (ZOFRAN ) IV, sodium chloride  flush, sodium chloride  flush, temazepam   Antimicrobials from admission:  Anti-infectives (From admission, onward)    None           Data Reviewed:  I have personally reviewed the  following...  CBC: Recent Labs  Lab 09/17/23 0444 09/18/23 0400  WBC 11.6* 9.9  HGB 12.5* 11.5*  HCT 39.9 35.5*  MCV 91.3 87.2  PLT 151 163   Basic Metabolic Panel: Recent Labs  Lab 09/17/23 0444 09/18/23 0400 09/18/23 0406  NA 136 138  --   K 4.3 3.9  --   CL 104 104  --   CO2 21* 21*  --   GLUCOSE 176* 153*  --   BUN 23 27*  --   CREATININE 0.78 0.89  --   CALCIUM 8.2* 8.2*  --   MG  --   --  2.3   GFR: Estimated Creatinine Clearance: 104.5 mL/min (by C-G formula based on SCr of  0.89 mg/dL). Liver Function Tests: Recent Labs  Lab 09/18/23 0400  AST 17  ALT 24  ALKPHOS 51  BILITOT 0.9  PROT 5.9*  ALBUMIN 3.0*   No results for input(s): LIPASE, AMYLASE in the last 168 hours. No results for input(s): AMMONIA in the last 168 hours. Coagulation Profile: No results for input(s): INR, PROTIME in the last 168 hours. Cardiac Enzymes: No results for input(s): CKTOTAL, CKMB, CKMBINDEX, TROPONINI in the last 168 hours. BNP (last 3 results) No results for input(s): PROBNP in the last 8760 hours. HbA1C: No results for input(s): HGBA1C in the last 72 hours. CBG: Recent Labs  Lab 09/17/23 1628 09/18/23 0756 09/18/23 1214  GLUCAP 143* 133* 222*   Lipid Profile: No results for input(s): CHOL, HDL, LDLCALC, TRIG, CHOLHDL, LDLDIRECT in the last 72 hours. Thyroid  Function Tests: Recent Labs    09/17/23 0625  TSH <0.010*  FREET4 4.66*   Anemia Panel: No results for input(s): VITAMINB12, FOLATE, FERRITIN, TIBC, IRON, RETICCTPCT in the last 72 hours. Most Recent Urinalysis On File:     Component Value Date/Time   COLORURINE YELLOW (A) 08/07/2016 1337   APPEARANCEUR CLEAR (A) 08/07/2016 1337   LABSPEC 1.020 08/07/2016 1337   PHURINE 5.0 08/07/2016 1337   GLUCOSEU NEGATIVE 08/07/2016 1337   HGBUR NEGATIVE 08/07/2016 1337   BILIRUBINUR NEGATIVE 08/07/2016 1337   KETONESUR NEGATIVE 08/07/2016 1337   PROTEINUR  NEGATIVE 08/07/2016 1337   NITRITE NEGATIVE 08/07/2016 1337   LEUKOCYTESUR NEGATIVE 08/07/2016 1337   Sepsis Labs: @LABRCNTIP (procalcitonin:4,lacticidven:4) Microbiology: Recent Results (from the past 240 hours)  Resp panel by RT-PCR (RSV, Flu A&B, Covid) Anterior Nasal Swab     Status: None   Collection Time: 09/17/23  5:55 AM   Specimen: Anterior Nasal Swab  Result Value Ref Range Status   SARS Coronavirus 2 by RT PCR NEGATIVE NEGATIVE Final    Comment: (NOTE) SARS-CoV-2 target nucleic acids are NOT DETECTED.  The SARS-CoV-2 RNA is generally detectable in upper respiratory specimens during the acute phase of infection. The lowest concentration of SARS-CoV-2 viral copies this assay can detect is 138 copies/mL. A negative result does not preclude SARS-Cov-2 infection and should not be used as the sole basis for treatment or other patient management decisions. A negative result may occur with  improper specimen collection/handling, submission of specimen other than nasopharyngeal swab, presence of viral mutation(s) within the areas targeted by this assay, and inadequate number of viral copies(<138 copies/mL). A negative result must be combined with clinical observations, patient history, and epidemiological information. The expected result is Negative.  Fact Sheet for Patients:  bloggercourse.com  Fact Sheet for Healthcare Providers:  seriousbroker.it  This test is no t yet approved or cleared by the [place]  FDA and  has been authorized for detection and/or diagnosis of SARS-CoV-2 by FDA under an Emergency Use Authorization (EUA). This EUA will remain  in effect (meaning this test can be used) for the duration of the COVID-19 declaration under Section 564(b)(1) of the Act, 21 U.S.C.section 360bbb-3(b)(1), unless the authorization is terminated  or revoked sooner.       Influenza A by PCR NEGATIVE NEGATIVE Final    Influenza B by PCR NEGATIVE NEGATIVE Final    Comment: (NOTE) The Xpert Xpress SARS-CoV-2/FLU/RSV plus assay is intended as an aid in the diagnosis of influenza from Nasopharyngeal swab specimens and should not be used as a sole basis for treatment. Nasal washings and aspirates are unacceptable for Xpert Xpress SARS-CoV-2/FLU/RSV testing.  Fact Sheet for  Patients: bloggercourse.com  Fact Sheet for Healthcare Providers: seriousbroker.it  This test is not yet approved or cleared by the [place]  FDA and has been authorized for detection and/or diagnosis of SARS-CoV-2 by FDA under an Emergency Use Authorization (EUA). This EUA will remain in effect (meaning this test can be used) for the duration of the COVID-19 declaration under Section 564(b)(1) of the Act, 21 U.S.C. section 360bbb-3(b)(1), unless the authorization is terminated or revoked.     Resp Syncytial Virus by PCR NEGATIVE NEGATIVE Final    Comment: (NOTE) Fact Sheet for Patients: bloggercourse.com  Fact Sheet for Healthcare Providers: seriousbroker.it  This test is not yet approved or cleared by the [place]  FDA and has been authorized for detection and/or diagnosis of SARS-CoV-2 by FDA under an Emergency Use Authorization (EUA). This EUA will remain in effect (meaning this test can be used) for the duration of the COVID-19 declaration under Section 564(b)(1) of the Act, 21 U.S.C. section 360bbb-3(b)(1), unless the authorization is terminated or revoked.  Performed at [facility], [address], [place], [place] [address]       Radiology Studies last 3 days: CT Angio Chest/Abd/Pel for Dissection W and/or Wo Contrast Result Date: 09/17/2023 CLINICAL DATA:  Chest pain. EXAM: CT ANGIOGRAPHY CHEST, ABDOMEN AND PELVIS TECHNIQUE: Non-contrast CT of the chest was initially obtained. Multidetector CT  imaging through the chest, abdomen and pelvis was performed using the standard protocol during bolus administration of intravenous contrast. Multiplanar reconstructed images and MIPs were obtained and reviewed to evaluate the vascular anatomy. RADIATION DOSE REDUCTION: This exam was performed according to the departmental dose-optimization program which includes automated exposure control, adjustment of the mA and/or kV according to patient size and/or use of iterative reconstruction technique. CONTRAST:  OMNIPAQUE  IOHEXOL  350 MG/ML SOLN COMPARISON:  August 20, 2023. FINDINGS: CTA CHEST FINDINGS Cardiovascular: Preferential opacification of the thoracic aorta. No evidence of thoracic aortic aneurysm or dissection. Normal heart size. No pericardial effusion. Aortic valve calcifications are noted. Coronary artery calcifications are noted. Mediastinum/Nodes: No enlarged mediastinal, hilar, or axillary lymph nodes. Thyroid  gland, trachea, and esophagus demonstrate no significant findings. Lungs/Pleura: Small bilateral pleural effusions are noted with minimal adjacent subsegmental atelectasis. No pneumothorax is noted. Musculoskeletal: No chest wall abnormality. No acute or significant osseous findings. Review of the MIP images confirms the above findings. CTA ABDOMEN AND PELVIS FINDINGS VASCULAR Aorta: Atherosclerosis of abdominal aorta is noted without aneurysm or dissection. Celiac: Patent without evidence of aneurysm, dissection, vasculitis or significant stenosis. SMA: Patent without evidence of aneurysm, dissection, vasculitis or significant stenosis. Renals: Both renal arteries are patent without evidence of aneurysm, dissection, vasculitis, fibromuscular dysplasia or significant stenosis. IMA: Patent without evidence of aneurysm, dissection, vasculitis or significant stenosis. Inflow: Patent without evidence of aneurysm, dissection, vasculitis or significant stenosis. Veins: No obvious venous abnormality  within the limitations of this arterial phase study. Review of the MIP images confirms the above findings. NON-VASCULAR Hepatobiliary: No focal liver abnormality is seen. No gallstones, gallbladder wall thickening, or biliary dilatation. Pancreas: Fatty replacement of the pancreas is noted. Spleen: Normal in size without focal abnormality. Adrenals/Urinary Tract: Adrenal glands are unremarkable. Large calcified renal cyst is noted in upper pole which is unchanged. Smaller cyst is seen more inferiorly in right kidney. No follow-up is required for these. No hydronephrosis or renal obstruction. Urinary bladder is unremarkable. Stomach/Bowel: Status post gastric bypass. Appendix appears normal. No evidence of bowel obstruction or inflammation. Lymphatic: No adenopathy. Reproductive: Prostate is unremarkable. Other:  Small fat containing periumbilical hernia.  No ascites. Musculoskeletal: Status post right total hip arthroplasty. No acute osseous abnormality is noted. Review of the MIP images confirms the above findings. IMPRESSION: No evidence of thoracic or abdominal aortic dissection or aneurysm. Small pleural effusions are noted. Coronary artery calcifications are noted. Aortic Atherosclerosis (ICD10-I70.0). Electronically Signed   By: Lynwood Landy Raddle M.D.   On: 09/17/2023 08:41   DG Chest 2 View Result Date: 09/17/2023 CLINICAL DATA:  Chest pains and coughing. EXAM: CHEST - 2 VIEW COMPARISON:  Portable chest 08/20/2023 FINDINGS: 5:05 a.m. There is mild cardiomegaly. There is central vascular prominence and flow cephalization and increased mild generalized interstitial consolidation consistent with edema, with small pleural effusions beginning to blunt the sulci. No alveolar infiltrate is seen. The mediastinum is normally outlined. Interval right PICC removal. Thoracic cage is intact. IMPRESSION: Findings consistent with CHF and mild interstitial edema. Small pleural effusions. Electronically Signed   By: Francis Quam M.D.   On: 09/17/2023 06:13        Joyleen Haselton, DO [facility] 09/18/2023, 4:38 PM    Dictation software may have been used to generate the above note. Typos may occur and escape review in typed/dictated notes. Please contact Dr Marsa directly for clarity if needed.  Staff may message me via secure chat in Epic  but this may not receive an immediate response,  please page me for urgent matters!  If 7PM-7AM, please contact night coverage [email or web address]

## 2023-09-18 NOTE — ED Notes (Signed)
 Per Dr. Shirlee Latch, ok to give Metoprolol and 80mg  of Lasix.

## 2023-09-18 NOTE — Progress Notes (Signed)
 Heart Failure Navigator Progress Note  Assessed for Heart & Vascular TOC clinic readiness.  Patient does not meet criteria due to Advanced Heart Failure patient of Dr. Ezra Shuck, MD.  Patient had an appointment scheduled already with Ellouise Class, FNP on Monday, September 22, 2023.  That appointment was cancelled and a new appointment made for 09/24/23 @ 11:30.  Patient aware of appointment change. Patient denied any heart failure education needs at this time. Navigator will sign off.  Charmaine Pines, RN, BSN [facility] Heart Failure Navigator Secure Chat Only

## 2023-09-18 NOTE — ED Notes (Signed)
 Pt aox4 denies needs reports insomnia pt appears in nad denies cp denies sob

## 2023-09-18 NOTE — Consult Note (Signed)
 Advanced Heart Failure Team Consult Note   Primary Physician: Fernande Ophelia JINNY DOUGLAS, MD Cardiologist:  Timothy Gollan, MD  Reason for Consultation: CHF  HPI:    William [name]. is seen today for evaluation of CHF at the request of Dr. Perla.   68 y.o. with history of chronic systolic CHF, atrial fibrillation, and hyperthyroidism was admitted with atrial fibrillation/RVR and CHF.  Back in 2021, after getting a COVID vaccination, patient went into atrial fibrillation with RVR.  Echo in 3/21 showed EF < 20%.  He underwent DCCV back to NSR and was started on amiodarone .  LV function made some improvement over time, in 4/24, echo showed EF 40-45% with moderate AS.  Patient did well symptomatically until 12/24.  At that time, he was admitted with CHF and atrial fibrillation with RVR in the setting of hyperthyroidism.  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  Thyroid  stimulating antibodies were negative.  Echo in 12/24 showed EF 20-25% with normal RV, mild-moderate MR, moderate AS with mean gradient 16 mmHg. He was started on methimazole  and prednisone  and converted to NSR spontaneously.   He was discharged home, but only got methimazole  5 mg tid from the pharmacy rather than the ordered 15 mg tid and never got prednisone .    He returned to the hospital this admission with reoccurrence of atrial fibrillation/RVR with CHF.  He notes palpitations as well as dyspnea walking more than a short distance.  SBP 80s-90s but no lightheadedness. BNP 2499.    Home Medications Prior to Admission medications   Medication Sig Start Date End Date Taking? Authorizing Provider  albuterol (VENTOLIN HFA) 108 (90 Base) MCG/ACT inhaler Inhale 2 puffs into the lungs every 6 (six) hours as needed for wheezing. 08/27/23  Yes [provider]  Cyanocobalamin  (B-12) 2500 MCG TABS Take 5,000 mcg by mouth daily.    Yes [provider]  ELIQUIS  5 MG TABS tablet TAKE 1 TABLET BY MOUTH  TWICE  DAILY 02/11/23  Yes [place] A, FNP  FLUoxetine  (PROZAC ) 40 MG capsule Take 1 capsule by mouth daily. 05/23/23  Yes [provider]  furosemide  (LASIX ) 40 MG tablet Take 0.5 tablets (20 mg total) by mouth daily. 08/24/23  Yes Jens Durand, MD  JARDIANCE  25 MG TABS tablet Take 25 mg by mouth daily. 11/02/19  Yes [provider]  metFORMIN  (GLUCOPHAGE ) 1000 MG tablet Take 1,000 mg by mouth 2 (two) times daily with a meal.    Yes [provider]  methimazole  (TAPAZOLE ) 5 MG tablet Take 3 tablets (15 mg total) by mouth 3 (three) times daily. 08/24/23  Yes Jens Durand, MD  metoprolol  succinate (TOPROL -XL) 50 MG 24 hr tablet Take 1 tablet (50 mg total) by mouth daily. Take with or immediately following a meal. 08/24/23  Yes Jens Durand, MD  Multiple Vitamin (MULTIVITAMIN WITH MINERALS) TABS tablet Take 1 tablet by mouth daily.   Yes [provider]  pantoprazole  (PROTONIX ) 40 MG tablet Take 40 mg by mouth every evening.    Yes [provider]  pravastatin  (PRAVACHOL ) 40 MG tablet Take 40 mg by mouth at bedtime.  09/22/19  Yes [provider]  tirzepatide CLOYDE) 12.5 MG/0.5ML Pen Inject 12.5 mg into the skin once a week. 05/16/23  Yes [provider]  amoxicillin (AMOXIL) 500 MG capsule Take 2,000 mg by mouth once as needed (Pretreatment for Procedure(s)). Patient not taking: Reported on 09/17/2023 06/12/23   [provider]  betamethasone valerate (VALISONE) 0.1 % cream Apply 1 Application topically 2 (two) times daily as needed. Patient not taking: Reported on 09/17/2023 04/11/23   [provider]  ipratropium (ATROVENT ) 0.03 % nasal spray Place 2 sprays into the nose 2 (two) times daily. 08/14/23 08/28/23  [provider]  potassium chloride  SA (KLOR-CON  M) 20 MEQ tablet Take 1 tablet (20 mEq total) by mouth daily for 5 days. 08/24/23 08/29/23  Jens Durand, MD  traZODone  (DESYREL ) 50 MG tablet Take 1  tablet (50 mg total) by mouth at bedtime. Patient not taking: Reported on 09/17/2023 08/24/23   Jens Durand, MD  triamcinolone  cream (KENALOG ) 0.1 % Apply 1 Application topically 2 (two) times daily as needed. 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   CHF (congestive heart failure) (HCC)    Diabetes mellitus without complication (HCC)    GERD (gastroesophageal reflux disease)    Hypertension    Iron deficiency anemia    Obesity    Osteoarthritis of hip    right   Peptic ulcer disease     Past Surgical History: Past Surgical History:  Procedure Laterality Date   CARDIOVERSION N/A 11/26/2019   Procedure: CARDIOVERSION;  Surgeon: Bosie Vinie LABOR, MD;  Location: [facility] ORS;  Service: Cardiovascular;  Laterality: N/A;   COLONOSCOPY     ESOPHAGOGASTRODUODENOSCOPY     GASTRIC BYPASS     2009   NASAL SINUS SURGERY     TOTAL HIP ARTHROPLASTY Right 08/20/2016   Procedure: TOTAL HIP ARTHROPLASTY ANTERIOR APPROACH;  Surgeon: Ozell Flake, MD;  Location: [facility] ORS;  Service: Orthopedics;  Laterality: Right;    Family History: Family History  Problem Relation Age of Onset   Dementia Mother    Diabetes Father     Social History: Social History   Socioeconomic History   Marital status: Single    Spouse name: Not on file   Number of children: Not on file   Years of education: Not on file   Highest education level: Not on file  Occupational History   Not on file  Tobacco Use   Smoking status: Never   Smokeless tobacco: Never  Vaping Use   Vaping status: Never Used  Substance and Sexual Activity   Alcohol use: No   Drug use: No   Sexual activity: Not Currently  Other Topics Concern   Not on file  Social History Narrative   Not on file   Social Drivers of Health   Financial Resource Strain: Low Risk  (05/16/2023)   Received  from [facility]   Overall Financial Resource Strain (CARDIA)    Difficulty of Paying Living Expenses: Not hard at all  Food Insecurity: No Food Insecurity (08/20/2023)   Hunger Vital Sign    Worried About Running Out of Food in the Last Year: Never true    Ran Out of Food in the Last Year: Never true  Transportation Needs: No Transportation Needs (08/20/2023)   PRAPARE - Administrator, Civil Service (Medical): No    Lack of Transportation (Non-Medical): No  Physical Activity: Not on file  Stress: Stress Concern Present (12/03/2019)   Received from [facility], [facility]   [employer] of Occupational Health - Occupational Stress Questionnaire    Feeling of Stress :  To some extent  Social Connections: Unknown (12/03/2019)   Received from [facility], [facility]   Social Connection and Isolation Panel [NHANES]    Frequency of Communication with Friends and Family: More than three times a week    Frequency of Social Gatherings with Friends and Family: Not on file    Attends Religious Services: Not on file    Active Member of Clubs or Organizations: Not on file    Attends Banker Meetings: Not on file    Marital Status: Not on file    Allergies:  Allergies  Allergen Reactions   Morphine And Codeine Hives   Nsaids Other (See Comments)    Gastric bypass    Objective:    Vital Signs:   Temp:  [98.1 F (36.7 C)-98.3 F (36.8 C)] 98.1 F (36.7 C) (01/09 0811) Pulse Rate:  [53-135] 102 (01/09 0930) Resp:  [15-26] 21 (01/09 0930) BP: (79-125)/(63-100) 114/83 (01/09 0930) SpO2:  [85 %-100 %] 97 % (01/09 0930)    Weight change: Filed Weights   09/17/23 0442  Weight: 113.4 kg    Intake/Output:   Intake/Output Summary (Last 24 hours) at 09/18/2023 0941 Last data filed at 09/17/2023 1518 Gross per 24 hour  Intake 350 ml  Output --  Net 350 ml       Physical Exam    General:  Well appearing. No resp difficulty HEENT: normal Neck: supple. JVP 12-14 cm. Carotids 2+ bilat; no bruits. No lymphadenopathy or thyromegaly appreciated. Cor: PMI nondisplaced. Tachy, irregular rate & rhythm. 2/6 early SEM RUSB Lungs: clear Abdomen: soft, nontender, nondistended. No hepatosplenomegaly. No bruits or masses. Good bowel sounds. Extremities: no cyanosis, clubbing, rash. 1+ ankle edema.  Neuro: alert & orientedx3, cranial nerves grossly intact. moves all 4 extremities w/o difficulty. Affect pleasant   Telemetry   Atrial fibrillation rate 110s-120s (Personally reviewed)  EKG    Atrial fibrillation with IVCD 178 msec and QTc 510 msec (personally reviewed)  Labs   Basic Metabolic Panel: Recent Labs  Lab 09/17/23 0444 09/18/23 0400 09/18/23 0406  NA 136 138  --   K 4.3 3.9  --   CL 104 104  --   CO2 21* 21*  --   GLUCOSE 176* 153*  --   BUN 23 27*  --   CREATININE 0.78 0.89  --   CALCIUM 8.2* 8.2*  --   MG  --   --  2.3    Liver Function Tests: Recent Labs  Lab 09/18/23 0400  AST 17  ALT 24  ALKPHOS 51  BILITOT 0.9  PROT 5.9*  ALBUMIN 3.0*   No results for input(s): LIPASE, AMYLASE in the last 168 hours. No results for input(s): AMMONIA in the last 168 hours.  CBC: Recent Labs  Lab 09/17/23 0444 09/18/23 0400  WBC 11.6* 9.9  HGB 12.5* 11.5*  HCT 39.9 35.5*  MCV 91.3 87.2  PLT 151 163    Cardiac Enzymes: No results for input(s): CKTOTAL, CKMB, CKMBINDEX, TROPONINI in the last 168 hours.  BNP: BNP (last 3 results) Recent Labs    08/19/23 2232 09/17/23 0444  BNP 1,995.5* 2,499.1*    ProBNP (last 3 results) No results for input(s): PROBNP in the last 8760 hours.   CBG: Recent Labs  Lab 09/17/23 1628 09/18/23 0756  GLUCAP 143* 133*    Coagulation Studies: No results for input(s): LABPROT, INR in the last 72 hours.   Imaging   No results found.  Medications:      Current Medications:  apixaban   5 mg Oral BID   digoxin   0.25 mg Oral Daily   empagliflozin   25 mg Oral Daily   FLUoxetine   40 mg Oral Daily   furosemide   80 mg Intravenous BID   insulin  aspart  0-9 Units Subcutaneous TID WC   methimazole   15 mg Oral TID   metoprolol  succinate  25 mg Oral Daily   pantoprazole   40 mg Oral QPM   potassium chloride   40 mEq Oral Once   pravastatin   40 mg Oral QHS   predniSONE   40 mg Oral Q breakfast   sodium chloride  flush  3 mL Intravenous Q12H   sodium chloride  flush  3-10 mL Intravenous Q12H    Infusions:  sodium chloride         Assessment/Plan   1. 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  He was taking a lower dose of methimazole  than prescribed and also did not get prednisone  at home.  He had prior episode of AF in 2021, had been on amiodarone  since that time.  Amiodarone  was stopped in 12/24 with type 2 AIT.  Today, HR in 110s-120s.  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  Needs to get back into NSR though antiarrhythmic options are limited.  Would avoid amiodarone  use unless absolutely necessary, and with persistently long QTc, I do not think that he will be able to take Tikosyn.  - Continue digoxin  for rate control.  - Continue Toprol  XL 25 mg daily, would not increase dose with soft BP.  - Continue Eliquis  5 mg bid.  - Restart hyperthyroidism treatment with methimazole  and prednisone .   - I will arrange for TEE-guided DCCV tomorrow.  He thinks he could have missed Eliquis  doses within the last month.  I discussed risks/benefits with him and he agrees to procedure.  - Ultimately, he should be evaluated for AF ablation.  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 Thyroid  stimulating antibodies were negative in 12/24, less likely type 1. He was taking lower than prescribed dose of methimazole  at home and never got prednisone  after  last discharge.  - Methimazole  15 mg tid.  - Restart prednisone  at 40 mg daily.  Slow taper over 1-3 months depending on thyroid  labs. When he goes home, will need PPI and Bactrim prophylaxis.  - Needs to see an endocrinologist.  3. Acute on chronic systolic CHF: Presumed nonischemic cardiomyopathy, ?tachycardia-mediated with AF/RVR.  Echo in 3/21 with EF < 20%.  He was in atrial fibrillation with RVR at that time, converted to NSR on amiodarone .  Slow improvement in EF over time, echo in 4/24 with EF 40-45%, moderate aortic stenosis.  Recurrent AF/RVR in 12/24, echo with EF 20-25% with normal RV, mild-moderate MR, moderate AS with mean gradient 16 mmHg. Suspect fall in LV systolic function was tachycardia-mediated. He remains in AF/RVR, he is volume overloaded on exam today.  SBP 80s-90s, no room to titrate GDMT.  - Lasix  80 mg IV bid, replace K. Follow I/Os.  - Continue digoxin .  - Continue Toprol  XL 25 daily.  - Continue Jardiance .  - Needs conversion back to NSR.  - If EF does not improve significantly in NSR, consider right/left heart cath in future.  4. Aortic stenosis: Moderate on echo this admission.  5. DM2: Watch glucose with steroid use.   Length of Stay: 1  Ezra Shuck, MD  09/18/2023, 9:41 AM  Advanced Heart Failure  Team Pager [phone] (M-F; 7a - 5p)  Please contact [facility] for night-coverage after hours (4p -7a ) and weekends on [email or web address]

## 2023-09-19 DIAGNOSIS — I5023 Acute on chronic systolic (congestive) heart failure: Secondary | ICD-10-CM | POA: Diagnosis not present

## 2023-09-19 DIAGNOSIS — R0789 Other chest pain: Secondary | ICD-10-CM | POA: Diagnosis not present

## 2023-09-19 LAB — BASIC METABOLIC PANEL
Anion gap: 10 (ref 5–15)
BUN: 31 mg/dL — ABNORMAL HIGH (ref 8–23)
CO2: 26 mmol/L (ref 22–32)
Calcium: 8.3 mg/dL — ABNORMAL LOW (ref 8.9–10.3)
Chloride: 104 mmol/L (ref 98–111)
Creatinine, Ser: 1.03 mg/dL (ref 0.61–1.24)
GFR, Estimated: >60 mL/min (ref >60)
Glucose, Bld: 136 mg/dL — ABNORMAL HIGH (ref 70–99)
Potassium: 4.4 mmol/L (ref 3.5–5.1)
Sodium: 140 mmol/L (ref 135–145)

## 2023-09-19 LAB — CBC
HCT: 36.8 % — ABNORMAL LOW (ref 39.0–52.0)
Hemoglobin: 11.7 g/dL — ABNORMAL LOW (ref 13.0–17.0)
MCH: 28.2 pg (ref 26.0–34.0)
MCHC: 31.8 g/dL (ref 30.0–36.0)
MCV: 88.7 fL (ref 80.0–100.0)
Platelets: 176 10*3/uL (ref 150–400)
RBC: 4.15 MIL/uL — ABNORMAL LOW (ref 4.22–5.81)
RDW: 13.8 % (ref 11.5–15.5)
WBC: 9.7 10*3/uL (ref 4.0–10.5)
nRBC: 0 % (ref 0.0–0.2)

## 2023-09-19 LAB — GLUCOSE, CAPILLARY
Glucose-Capillary: 131 mg/dL — ABNORMAL HIGH (ref 70–99)
Glucose-Capillary: 173 mg/dL — ABNORMAL HIGH (ref 70–99)
Glucose-Capillary: 279 mg/dL — ABNORMAL HIGH (ref 70–99)
Glucose-Capillary: 213 mg/dL — ABNORMAL HIGH (ref 70–99)

## 2023-09-19 MED ORDER — POTASSIUM CHLORIDE CRYS ER 20 MEQ PO TBCR
40.0000 meq | EXTENDED_RELEASE_TABLET | Freq: Once | ORAL | Status: AC
  Administered 2023-09-19: 40 meq via ORAL
  Filled 2023-09-19: qty 2

## 2023-09-19 MED ORDER — METOPROLOL SUCCINATE ER 25 MG PO TB24
25.0000 mg | ORAL_TABLET | Freq: Once | ORAL | Status: AC
  Administered 2023-09-19: 25 mg via ORAL
  Filled 2023-09-19: qty 1

## 2023-09-19 MED ORDER — METOPROLOL SUCCINATE ER 25 MG PO TB24
25.0000 mg | ORAL_TABLET | Freq: Two times a day (BID) | ORAL | Status: DC
  Administered 2023-09-19 – 2023-09-22 (×6): 25 mg via ORAL
  Filled 2023-09-19 (×7): qty 1

## 2023-09-19 MED ORDER — GUAIFENESIN-DM 100-10 MG/5ML PO SYRP
5.0000 mL | ORAL_SOLUTION | ORAL | Status: DC | PRN
  Administered 2023-09-19 – 2023-09-21 (×3): 5 mL via ORAL
  Filled 2023-09-19 (×3): qty 10

## 2023-09-19 MED ORDER — INSULIN ASPART 100 UNIT/ML IJ SOLN: 0.0000 [IU] | Freq: Every day | INTRAMUSCULAR | Status: DC

## 2023-09-19 MED ORDER — INSULIN ASPART 100 UNIT/ML IJ SOLN
0.0000 [IU] | Freq: Three times a day (TID) | INTRAMUSCULAR | Status: DC
  Administered 2023-09-19: 3 [IU] via SUBCUTANEOUS
  Administered 2023-09-19: 8 [IU] via SUBCUTANEOUS
  Administered 2023-09-20: 2 [IU] via SUBCUTANEOUS
  Administered 2023-09-20: 3 [IU] via SUBCUTANEOUS
  Administered 2023-09-20: 2 [IU] via SUBCUTANEOUS
  Administered 2023-09-21: 3 [IU] via SUBCUTANEOUS
  Administered 2023-09-21: 2 [IU] via SUBCUTANEOUS
  Administered 2023-09-21: 3 [IU] via SUBCUTANEOUS
  Administered 2023-09-22 (×2): 8 [IU] via SUBCUTANEOUS
  Administered 2023-09-23: 2 [IU] via SUBCUTANEOUS
  Administered 2023-09-23: 11 [IU] via SUBCUTANEOUS
  Administered 2023-09-24: 3 [IU] via SUBCUTANEOUS
  Administered 2023-09-24: 8 [IU] via SUBCUTANEOUS
  Administered 2023-09-24: 5 [IU] via SUBCUTANEOUS
  Filled 2023-09-19 (×15): qty 1

## 2023-09-19 MED ORDER — IPRATROPIUM BROMIDE 0.06 % NA SOLN: 2.0000 | Freq: Four times a day (QID) | NASAL | Status: DC | PRN

## 2023-09-19 MED ORDER — METOLAZONE 2.5 MG PO TABS
2.5000 mg | ORAL_TABLET | Freq: Once | ORAL | Status: AC
  Administered 2023-09-19: 2.5 mg via ORAL
  Filled 2023-09-19: qty 1

## 2023-09-19 NOTE — Progress Notes (Signed)
 PROGRESS NOTE    William [name].   FMW:[id number] DOB: 06/08/1956  DOA: 09/17/2023 Date of Service: 09/19/23 which is hospital day 2  PCP: William Ophelia JINNY DOUGLAS, MD    Hospital course / significant events:   HPI:  William [name]. is a 68 y.o. male with medical history significant of HFrEF, atrial fibrillation, type 2 diabetes, aortic stenosis, history of gastric bypass, amiodarone  induced hyperthyroidism presenting with cough, chest pain. Had been admitted 08/2023 for decompensated heart failure, A-fib with RVR, amiodarone  induced hypothyroidism. Has been on methimazole , prednisone  taper, Eliquis , metoprolol , Lasix . Patient states he was discharged with a cough that progressively improved but then worsened acutely over the past 1 to 2 weeks. Has had mild nonspecific chest pain over the past few days.    01/08: admitted for chest pain r/o ACS, exacerbation HFrEF, Afib w/ RVR - given soft BP have held rate control and diuresing w/ caution. Cardiology consulted - starting IV digoxin  and transition to po tomorrow, cautious lasix , GDMT limited by hypotension. Of note, taking methimazole  5 mg tid not 15 mg tid as rx.  01/09: HR still elevated, cardio recs for DCCV planning this for tomorrow  01/10: delay in DCCV - cannot safely sedate given recent mounjaro per anesthesia - at this point given high risk for general anesthesia planis to wait the standard 7 days from last dose which puts him at 09/23/23 unless he decompensates in the meantime.      Consultants:  Cardiology [facility])  Procedures/Surgeries: none      ASSESSMENT & PLAN:   Atrial fibrillation with RVR History of PAF, 2021  suspected type 2 amiodarone  induced thyrotoxicity dx 08/2023, then was taking a lower dose of methimazole  than prescribed and also did not get prednisone  at home.  Blood pressure has been borderline low, limiting rate control  Cardiology following  Continue digoxin  for rate control.  Continue Toprol  XL 25 mg  daily, would not increase dose with soft BP.  Continue Eliquis  5 mg bid.  Continue hyperthyroidism treatment with methimazole  and prednisone  DCCV delayed d/t concerns w/ anesthesia / recent Mounjaro use, would need to wait 7 days from last dose - cardiology and anesthesiology to determine next best steps, if he deompensates may need to have conversion under general anesthesia  Ultimately, he should be evaluated for AF ablation.   Acute chest wall pain Nonspecific likely multifactorial in setting of HF, Afib No ACS per cardiology monitor   Acute on chronic HFrEF (heart failure with reduced ejection fraction) (HCC) 2D echo December 2024 with a EF of 20 to 25% Cardiology following Digoxin  IV --> po per cardiology Continue Lasix  IV Low blood pressure limiting GDMT    Hyperthyroidism admission December 2024 for amiodarone  induced hypothyroidism On methimazole  and recent course of prednisone  TSH <0.10  Free T4 4.66  Will continue methimazole  S/p stress dose of hydrocortisone  in the ER out of concern for hypotension and ? Adrenal insufficiency    Cough, question URI  Noted recurrent cough over multiple weeks with recent evaluation for issues including A-fib with RVR, acute on chronic HFrEF, amiodarone  induced hyperthyroidism CT of the chest on admission grossly stable apart from pleural effusions Add as needed DuoNebs PPI Consider pulmonary follow-up if persists given prior amiodarone  toxicity with potential for pulmonary impact   GERD (gastroesophageal reflux disease) PPI   Type 2 diabetes mellitus without complication (HCC) Blood sugars 170s SSI  Monitor   Class 1 obesity based on BMI: Body mass index  is 34.87 kg/m.  Underweight - under 18  overweight - 25 to 29 obese - 30 or more Class 1 obesity: BMI of 30.0 to 34 Class 2 obesity: BMI of 35.0 to 39 Class 3 obesity: BMI of 40.0 to 49 Super Morbid Obesity: BMI 50-59 Super-super Morbid Obesity: BMI 60+ Significantly low  or high BMI is associated with higher medical risk.  Weight management advised as adjunct to other disease management and risk reduction treatments    DVT prophylaxis: eliquis  IV fluids: no continuous IV fluids  Nutrition: cardaic diet Central lines / invasive devices: noen  Code Status: FULL CODE ACP documentation reviewed:  none on file in [facility]  TOC needs: TBD but expect d/c back home with no home health needs  Barriers to dispo / significant pending items: DCCV per cardiology and anesthesia recs as above             Subjective / Brief ROS:  Patient reports no concerns at this time other than when the cardioversion will take place, I advised him I am deferring this to cardio/anesthesia teams but I answered his questions  Denies CP/SOB. Still some tightness/palpitations  Pain controlled.  Denies new weakness.  Tolerating diet.  Reports no concerns w/ urination/defecation.   Family Communication: family at beside and on phone w/ patient on rounds     Objective Findings:  Vitals:   09/19/23 0342 09/19/23 0348 09/19/23 0822 09/19/23 1147  BP: 96/73  99/73 92/74  Pulse: (!) 116  (!) 136 70  Resp: 16  15 16   Temp: 98 F (36.7 C)  97.8 F (36.6 C) 97.8 F (36.6 C)  TempSrc: Oral   Oral  SpO2: 96%  95% 98%  Weight:  114 kg    Height:        Intake/Output Summary (Last 24 hours) at 09/19/2023 1546 Last data filed at 09/19/2023 1443 Gross per 24 hour  Intake 580 ml  Output 1500 ml  Net -920 ml    Filed Weights   09/17/23 0442 09/19/23 0348  Weight: 113.4 kg 114 kg    Examination:  Physical Exam Constitutional:      General: He is not in acute distress. Cardiovascular:     Rate and Rhythm: Tachycardia present. Rhythm irregular.  Pulmonary:     Breath sounds: Normal breath sounds.  Musculoskeletal:     Right lower leg: No edema.     Left lower leg: No edema.  Neurological:     General: No focal deficit present.     Mental Status: He is alert and  oriented to person, place, and time.  Psychiatric:        Mood and Affect: Mood normal.        Behavior: Behavior normal.          Scheduled Medications:   apixaban   5 mg Oral BID   digoxin   0.25 mg Oral Daily   empagliflozin   25 mg Oral Daily   FLUoxetine   40 mg Oral Daily   furosemide   80 mg Intravenous BID   insulin  aspart  0-15 Units Subcutaneous TID WC   methimazole   15 mg Oral TID   metoprolol  succinate  25 mg Oral BID   pantoprazole   40 mg Oral QPM   pravastatin   40 mg Oral QHS    Continuous Infusions:    PRN Medications:  levalbuterol , ondansetron  **OR** ondansetron  (ZOFRAN ) IV, zolpidem   Antimicrobials from admission:  Anti-infectives (From admission, onward)    None  Data Reviewed:  I have personally reviewed the following...  CBC: Recent Labs  Lab 09/17/23 0444 09/18/23 0400 09/19/23 0303  WBC 11.6* 9.9 9.7  HGB 12.5* 11.5* 11.7*  HCT 39.9 35.5* 36.8*  MCV 91.3 87.2 88.7  PLT 151 163 176   Basic Metabolic Panel: Recent Labs  Lab 09/17/23 0444 09/18/23 0400 09/18/23 0406 09/19/23 0303  NA 136 138  --  140  K 4.3 3.9  --  4.4  CL 104 104  --  104  CO2 21* 21*  --  26  GLUCOSE 176* 153*  --  136*  BUN 23 27*  --  31*  CREATININE 0.78 0.89  --  1.03  CALCIUM 8.2* 8.2*  --  8.3*  MG  --   --  2.3  --    GFR: Estimated Creatinine Clearance: 90.6 mL/min (by C-G formula based on SCr of 1.03 mg/dL). Liver Function Tests: Recent Labs  Lab 09/18/23 0400  AST 17  ALT 24  ALKPHOS 51  BILITOT 0.9  PROT 5.9*  ALBUMIN 3.0*   No results for input(s): LIPASE, AMYLASE in the last 168 hours. No results for input(s): AMMONIA in the last 168 hours. Coagulation Profile: No results for input(s): INR, PROTIME in the last 168 hours. Cardiac Enzymes: No results for input(s): CKTOTAL, CKMB, CKMBINDEX, TROPONINI in the last 168 hours. BNP (last 3 results) No results for input(s): PROBNP in the last 8760  hours. HbA1C: No results for input(s): HGBA1C in the last 72 hours. CBG: Recent Labs  Lab 09/18/23 1214 09/18/23 1657 09/18/23 2116 09/19/23 0838 09/19/23 1220  GLUCAP 222* 178* 189* 131* 173*   Lipid Profile: No results for input(s): CHOL, HDL, LDLCALC, TRIG, CHOLHDL, LDLDIRECT in the last 72 hours. Thyroid  Function Tests: Recent Labs    09/17/23 0625  TSH <0.010*  FREET4 4.66*   Anemia Panel: No results for input(s): VITAMINB12, FOLATE, FERRITIN, TIBC, IRON, RETICCTPCT in the last 72 hours. Most Recent Urinalysis On File:     Component Value Date/Time   COLORURINE YELLOW (A) 08/07/2016 1337   APPEARANCEUR CLEAR (A) 08/07/2016 1337   LABSPEC 1.020 08/07/2016 1337   PHURINE 5.0 08/07/2016 1337   GLUCOSEU NEGATIVE 08/07/2016 1337   HGBUR NEGATIVE 08/07/2016 1337   BILIRUBINUR NEGATIVE 08/07/2016 1337   KETONESUR NEGATIVE 08/07/2016 1337   PROTEINUR NEGATIVE 08/07/2016 1337   NITRITE NEGATIVE 08/07/2016 1337   LEUKOCYTESUR NEGATIVE 08/07/2016 1337   Sepsis Labs: @LABRCNTIP (procalcitonin:4,lacticidven:4) Microbiology: Recent Results (from the past 240 hours)  Resp panel by RT-PCR (RSV, Flu A&B, Covid) Anterior Nasal Swab     Status: None   Collection Time: 09/17/23  5:55 AM   Specimen: Anterior Nasal Swab  Result Value Ref Range Status   SARS Coronavirus 2 by RT PCR NEGATIVE NEGATIVE Final    Comment: (NOTE) SARS-CoV-2 target nucleic acids are NOT DETECTED.  The SARS-CoV-2 RNA is generally detectable in upper respiratory specimens during the acute phase of infection. The lowest concentration of SARS-CoV-2 viral copies this assay can detect is 138 copies/mL. A negative result does not preclude SARS-Cov-2 infection and should not be used as the sole basis for treatment or other patient management decisions. A negative result may occur with  improper specimen collection/handling, submission of specimen other than nasopharyngeal swab,  presence of viral mutation(s) within the areas targeted by this assay, and inadequate number of viral copies(<138 copies/mL). A negative result must be combined with clinical observations, patient history, and epidemiological information. The expected result is Negative.  Fact Sheet for Patients:  bloggercourse.com  Fact Sheet for Healthcare Providers:  seriousbroker.it  This test is no t yet approved or cleared by the [place]  FDA and  has been authorized for detection and/or diagnosis of SARS-CoV-2 by FDA under an Emergency Use Authorization (EUA). This EUA will remain  in effect (meaning this test can be used) for the duration of the COVID-19 declaration under Section 564(b)(1) of the Act, 21 U.S.C.section 360bbb-3(b)(1), unless the authorization is terminated  or revoked sooner.       Influenza A by PCR NEGATIVE NEGATIVE Final   Influenza B by PCR NEGATIVE NEGATIVE Final    Comment: (NOTE) The Xpert Xpress SARS-CoV-2/FLU/RSV plus assay is intended as an aid in the diagnosis of influenza from Nasopharyngeal swab specimens and should not be used as a sole basis for treatment. Nasal washings and aspirates are unacceptable for Xpert Xpress SARS-CoV-2/FLU/RSV testing.  Fact Sheet for Patients: bloggercourse.com  Fact Sheet for Healthcare Providers: seriousbroker.it  This test is not yet approved or cleared by the [place]  FDA and has been authorized for detection and/or diagnosis of SARS-CoV-2 by FDA under an Emergency Use Authorization (EUA). This EUA will remain in effect (meaning this test can be used) for the duration of the COVID-19 declaration under Section 564(b)(1) of the Act, 21 U.S.C. section 360bbb-3(b)(1), unless the authorization is terminated or revoked.     Resp Syncytial Virus by PCR NEGATIVE NEGATIVE Final    Comment: (NOTE) Fact Sheet for  Patients: bloggercourse.com  Fact Sheet for Healthcare Providers: seriousbroker.it  This test is not yet approved or cleared by the [place]  FDA and has been authorized for detection and/or diagnosis of SARS-CoV-2 by FDA under an Emergency Use Authorization (EUA). This EUA will remain in effect (meaning this test can be used) for the duration of the COVID-19 declaration under Section 564(b)(1) of the Act, 21 U.S.C. section 360bbb-3(b)(1), unless the authorization is terminated or revoked.  Performed at [facility], [address], [place], [place] [address]       Radiology Studies last 3 days: CT Angio Chest/Abd/Pel for Dissection W and/or Wo Contrast Result Date: 09/17/2023 CLINICAL DATA:  Chest pain. EXAM: CT ANGIOGRAPHY CHEST, ABDOMEN AND PELVIS TECHNIQUE: Non-contrast CT of the chest was initially obtained. Multidetector CT imaging through the chest, abdomen and pelvis was performed using the standard protocol during bolus administration of intravenous contrast. Multiplanar reconstructed images and MIPs were obtained and reviewed to evaluate the vascular anatomy. RADIATION DOSE REDUCTION: This exam was performed according to the departmental dose-optimization program which includes automated exposure control, adjustment of the mA and/or kV according to patient size and/or use of iterative reconstruction technique. CONTRAST:  OMNIPAQUE  IOHEXOL  350 MG/ML SOLN COMPARISON:  August 20, 2023. FINDINGS: CTA CHEST FINDINGS Cardiovascular: Preferential opacification of the thoracic aorta. No evidence of thoracic aortic aneurysm or dissection. Normal heart size. No pericardial effusion. Aortic valve calcifications are noted. Coronary artery calcifications are noted. Mediastinum/Nodes: No enlarged mediastinal, hilar, or axillary lymph nodes. Thyroid  gland, trachea, and esophagus demonstrate no significant findings. Lungs/Pleura:  Small bilateral pleural effusions are noted with minimal adjacent subsegmental atelectasis. No pneumothorax is noted. Musculoskeletal: No chest wall abnormality. No acute or significant osseous findings. Review of the MIP images confirms the above findings. CTA ABDOMEN AND PELVIS FINDINGS VASCULAR Aorta: Atherosclerosis of abdominal aorta is noted without aneurysm or dissection. Celiac: Patent without evidence of aneurysm, dissection, vasculitis or significant stenosis. SMA: Patent without evidence of aneurysm, dissection, vasculitis or  significant stenosis. Renals: Both renal arteries are patent without evidence of aneurysm, dissection, vasculitis, fibromuscular dysplasia or significant stenosis. IMA: Patent without evidence of aneurysm, dissection, vasculitis or significant stenosis. Inflow: Patent without evidence of aneurysm, dissection, vasculitis or significant stenosis. Veins: No obvious venous abnormality within the limitations of this arterial phase study. Review of the MIP images confirms the above findings. NON-VASCULAR Hepatobiliary: No focal liver abnormality is seen. No gallstones, gallbladder wall thickening, or biliary dilatation. Pancreas: Fatty replacement of the pancreas is noted. Spleen: Normal in size without focal abnormality. Adrenals/Urinary Tract: Adrenal glands are unremarkable. Large calcified renal cyst is noted in upper pole which is unchanged. Smaller cyst is seen more inferiorly in right kidney. No follow-up is required for these. No hydronephrosis or renal obstruction. Urinary bladder is unremarkable. Stomach/Bowel: Status post gastric bypass. Appendix appears normal. No evidence of bowel obstruction or inflammation. Lymphatic: No adenopathy. Reproductive: Prostate is unremarkable. Other: Small fat containing periumbilical hernia.  No ascites. Musculoskeletal: Status post right total hip arthroplasty. No acute osseous abnormality is noted. Review of the MIP images confirms the above  findings. IMPRESSION: No evidence of thoracic or abdominal aortic dissection or aneurysm. Small pleural effusions are noted. Coronary artery calcifications are noted. Aortic Atherosclerosis (ICD10-I70.0). Electronically Signed   By: Lynwood Landy Raddle M.D.   On: 09/17/2023 08:41   DG Chest 2 View Result Date: 09/17/2023 CLINICAL DATA:  Chest pains and coughing. EXAM: CHEST - 2 VIEW COMPARISON:  Portable chest 08/20/2023 FINDINGS: 5:05 a.m. There is mild cardiomegaly. There is central vascular prominence and flow cephalization and increased mild generalized interstitial consolidation consistent with edema, with small pleural effusions beginning to blunt the sulci. No alveolar infiltrate is seen. The mediastinum is normally outlined. Interval right PICC removal. Thoracic cage is intact. IMPRESSION: Findings consistent with CHF and mild interstitial edema. Small pleural effusions. Electronically Signed   By: Francis Quam M.D.   On: 09/17/2023 06:13        Chandan Fly, DO [facility] 09/19/2023, 3:46 PM    Dictation software may have been used to generate the above note. Typos may occur and escape review in typed/dictated notes. Please contact Dr Marsa directly for clarity if needed.  Staff may message me via secure chat in Epic  but this may not receive an immediate response,  please page me for urgent matters!  If 7PM-7AM, please contact night coverage [email or web address]

## 2023-09-19 NOTE — Plan of Care (Signed)

## 2023-09-19 NOTE — Progress Notes (Signed)
 Transition of Care [facility]) - Inpatient Brief Assessment   Patient Details  Name: William [name]. MRN: [id number] Date of Birth: 1956/07/13  Transition of Care [facility]) CM/SW Contact:    Taiquan Campanaro C Harvir Patry, RN Phone Number: 09/19/2023, 3:26 PM   Clinical Narrative: TOC continuing to follow patient's progress throughout discharge planning.   Transition of Care Asessment: Insurance and Status: Insurance coverage has been reviewed Patient has primary care physician: Yes   Prior level of function:: independent Prior/Current Home Services: No current home services Social Drivers of Health Review: SDOH reviewed no interventions necessary Readmission risk has been reviewed: Yes Transition of care needs: no transition of care needs at this time

## 2023-09-19 NOTE — Progress Notes (Addendum)
 Patient ID: William [name]., male   DOB: 10-16-1955, 68 y.o.   MRN: [id number]     Advanced Heart Failure Rounding Note  Cardiologist: Evalene Lunger, MD  Chief Complaint: Shortness of breath Subjective:    Still in atrial fibrillation with RVR.  He says that he diuresed well yesterday but no I/Os recorded.  No dyspnea at rest.    Objective:   Weight Range: 114 kg Body mass index is 35.05 kg/m.   Vital Signs:   Temp:  [97.7 F (36.5 C)-98.2 F (36.8 C)] 98 F (36.7 C) (01/10 0342) Pulse Rate:  [102-128] 116 (01/10 0342) Resp:  [16-21] 16 (01/10 0342) BP: (87-114)/(72-83) 96/73 (01/10 0342) SpO2:  [95 %-99 %] 96 % (01/10 0342) Weight:  [885 kg] 114 kg (01/10 0348) Last BM Date : 09/17/23  Weight change: Filed Weights   09/17/23 0442 09/19/23 0348  Weight: 113.4 kg 114 kg    Intake/Output:  No intake or output data in the 24 hours ending 09/19/23 0746    Physical Exam    General:  Well appearing. No resp difficulty HEENT: Normal Neck: Supple. JVP 14 cm. Carotids 2+ bilat; no bruits. No lymphadenopathy or thyromegaly appreciated. Cor: PMI nondisplaced. Tachy, irregular rate & rhythm. 1/6 SEM RUSB. Lungs: Clear Abdomen: Soft, nontender, nondistended. No hepatosplenomegaly. No bruits or masses. Good bowel sounds. Extremities: No cyanosis, clubbing, rash. 1+ ankle edema.  Neuro: Alert & orientedx3, cranial nerves grossly intact. moves all 4 extremities w/o difficulty. Affect pleasant   Telemetry   Atrial fibrillation rate 110s-120s (personally reviewed)  Labs    CBC Recent Labs    09/18/23 0400 09/19/23 0303  WBC 9.9 9.7  HGB 11.5* 11.7*  HCT 35.5* 36.8*  MCV 87.2 88.7  PLT 163 176   Basic Metabolic Panel Recent Labs    98/90/74 0400 09/18/23 0406 09/19/23 0303  NA 138  --  140  K 3.9  --  4.4  CL 104  --  104  CO2 21*  --  26  GLUCOSE 153*  --  136*  BUN 27*  --  31*  CREATININE 0.89  --  1.03  CALCIUM 8.2*  --  8.3*  MG  --  2.3  --     Liver Function Tests Recent Labs    09/18/23 0400  AST 17  ALT 24  ALKPHOS 51  BILITOT 0.9  PROT 5.9*  ALBUMIN 3.0*   No results for input(s): LIPASE, AMYLASE in the last 72 hours. Cardiac Enzymes No results for input(s): CKTOTAL, CKMB, CKMBINDEX, TROPONINI in the last 72 hours.  BNP: BNP (last 3 results) Recent Labs    08/19/23 2232 09/17/23 0444  BNP 1,995.5* 2,499.1*    ProBNP (last 3 results) No results for input(s): PROBNP in the last 8760 hours.   D-Dimer No results for input(s): DDIMER in the last 72 hours. Hemoglobin A1C No results for input(s): HGBA1C in the last 72 hours. Fasting Lipid Panel No results for input(s): CHOL, HDL, LDLCALC, TRIG, CHOLHDL, LDLDIRECT in the last 72 hours. Thyroid  Function Tests Recent Labs    09/17/23 0625  TSH <0.010*    Other results:   Imaging    No results found.   Medications:     Scheduled Medications:  apixaban   5 mg Oral BID   digoxin   0.25 mg Oral Daily   empagliflozin   25 mg Oral Daily   FLUoxetine   40 mg Oral Daily   furosemide   80 mg Intravenous BID  insulin  aspart  0-9 Units Subcutaneous TID WC   methimazole   15 mg Oral TID   metolazone   2.5 mg Oral Once   metoprolol  succinate  25 mg Oral Daily   pantoprazole   40 mg Oral QPM   potassium chloride   40 mEq Oral Once   pravastatin   40 mg Oral QHS   predniSONE   40 mg Oral Q breakfast    Infusions:   PRN Medications: levalbuterol , ondansetron  **OR** ondansetron  (ZOFRAN ) IV, zolpidem    Assessment/Plan   1. 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  He was taking a lower dose of methimazole  than prescribed and also did not get prednisone  at home.  He had prior episode of AF in 2021, had been on amiodarone  since that time.  Amiodarone  was stopped in 12/24 with type 2 AIT.  Today, HR in 110s-120s.  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  Needs to get back into NSR though antiarrhythmic options are limited.  Would avoid amiodarone  use unless absolutely necessary, and with persistently long QTc, I do not think that he will be able to take Tikosyn.  - Continue digoxin  for rate control.  - Continue Toprol  XL 25 mg daily, would not increase dose with soft BP.  - Continue Eliquis  5 mg bid.  - Continue hyperthyroidism treatment with methimazole  and prednisone .   - He will have TEE-guided DCCV today.  He thinks he could have missed Eliquis  doses within the last month.  I discussed risks/benefits with him and he agrees to procedure.  - Ultimately, he should be evaluated for AF ablation.  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 Thyroid  stimulating antibodies were negative in 12/24, less likely type 1. He was taking lower than prescribed dose of methimazole  at home and never got prednisone  after last discharge.  - Methimazole  15 mg tid.  - Restarted prednisone  at 40 mg daily.  Slow taper over 1-3 months depending on thyroid  labs. When he goes home, will need PPI and Bactrim prophylaxis.  - Needs to see an endocrinologist.  3. Acute on chronic systolic CHF: Presumed nonischemic cardiomyopathy, ?tachycardia-mediated with AF/RVR.  Echo in 3/21 with EF < 20%.  He was in atrial fibrillation with RVR at that time, converted to NSR on amiodarone .  Slow improvement in EF over time, echo in 4/24 with EF 40-45%, moderate aortic stenosis.  Recurrent AF/RVR in 12/24, echo with EF 20-25% with normal RV, mild-moderate MR, moderate AS with mean gradient 16 mmHg. Suspect fall in LV systolic function was tachycardia-mediated. He remains in AF/RVR, he is volume overloaded on exam today.  SBP 80s-90s, no room to titrate GDMT. He says that he diuresed well yesterday but no I/Os done.  - Lasix  80 mg IV bid + metolazone  2.5 x 1, replace K. Follow I/Os.  - Continue digoxin .  - Continue Toprol  XL 25 daily.  - Continue  Jardiance .  - Needs conversion back to NSR.  - If EF does not improve significantly in NSR, consider right/left heart cath in future.  4. Aortic stenosis: Moderate on echo this admission.  5. DM2: Watch glucose with steroid use.   Length of Stay: 2  Ezra Shuck, MD  09/19/2023, 7:46 AM  Advanced Heart Failure Team Pager [phone] (M-F; 7a - 5p)  Please contact [facility] for night-coverage after hours (5p -7a ) and weekends on [email or web address]  Patient had Mounjaro on 1/7.  Discussed with both anesthesiology and CCM, will not be able to sedate  for TEE-guided DCCV until 1/14 safely.  Can do emergent DCCV if it becomes necessary.  Procedure today cancelled.   Ezra Shuck 09/19/2023 10:43 AM

## 2023-09-19 NOTE — Plan of Care (Signed)
  Problem: Fluid Volume: Goal: Ability to maintain a balanced intake and output will improve Outcome: Progressing   Problem: Health Behavior/Discharge Planning: Goal: Ability to identify and utilize available resources and services will improve Outcome: Progressing   Problem: Metabolic: Goal: Ability to maintain appropriate glucose levels will improve Outcome: Progressing   Problem: Tissue Perfusion: Goal: Adequacy of tissue perfusion will improve Outcome: Progressing   Problem: Cardiac: Goal: Ability to achieve and maintain adequate cardiopulmonary perfusion will improve Outcome: Progressing

## 2023-09-20 DIAGNOSIS — R0789 Other chest pain: Secondary | ICD-10-CM | POA: Diagnosis not present

## 2023-09-20 LAB — RESPIRATORY PANEL BY PCR

## 2023-09-20 LAB — GLUCOSE, CAPILLARY
Glucose-Capillary: 133 mg/dL — ABNORMAL HIGH (ref 70–99)
Glucose-Capillary: 171 mg/dL — ABNORMAL HIGH (ref 70–99)
Glucose-Capillary: 148 mg/dL — ABNORMAL HIGH (ref 70–99)
Glucose-Capillary: 203 mg/dL — ABNORMAL HIGH (ref 70–99)

## 2023-09-20 LAB — BASIC METABOLIC PANEL
Anion gap: 13 (ref 5–15)
BUN: 34 mg/dL — ABNORMAL HIGH (ref 8–23)
CO2: 26 mmol/L (ref 22–32)
Calcium: 8.8 mg/dL — ABNORMAL LOW (ref 8.9–10.3)
Chloride: 96 mmol/L — ABNORMAL LOW (ref 98–111)
Creatinine, Ser: 0.99 mg/dL (ref 0.61–1.24)
GFR, Estimated: >60 mL/min (ref >60)
Glucose, Bld: 151 mg/dL — ABNORMAL HIGH (ref 70–99)
Potassium: 3.4 mmol/L — ABNORMAL LOW (ref 3.5–5.1)
Sodium: 135 mmol/L (ref 135–145)

## 2023-09-20 LAB — CBC
HCT: 39.2 % (ref 39.0–52.0)
Hemoglobin: 13 g/dL (ref 13.0–17.0)
MCH: 28.1 pg (ref 26.0–34.0)
MCHC: 33.2 g/dL (ref 30.0–36.0)
MCV: 84.8 fL (ref 80.0–100.0)
Platelets: 251 10*3/uL (ref 150–400)
RBC: 4.62 MIL/uL (ref 4.22–5.81)
RDW: 13.4 % (ref 11.5–15.5)
WBC: 10.7 10*3/uL — ABNORMAL HIGH (ref 4.0–10.5)
nRBC: 0 % (ref 0.0–0.2)

## 2023-09-20 MED ORDER — BENZONATATE 100 MG PO CAPS
200.0000 mg | ORAL_CAPSULE | Freq: Three times a day (TID) | ORAL | Status: DC
  Administered 2023-09-20 – 2023-09-25 (×15): 200 mg via ORAL
  Filled 2023-09-20 (×15): qty 2

## 2023-09-20 NOTE — Progress Notes (Addendum)
 Cardiology Progress Note   Patient Name: William [name]. Date of Encounter: 09/20/2023  Primary Cardiologist: Timothy Gollan, MD  Subjective   Ambulating w/o difficulty.  In good spirits.  Still w/ lower ext swelling, though has noted significant improvement. Objective   Inpatient Medications    Scheduled Meds:  apixaban   5 mg Oral BID   benzonatate   200 mg Oral TID   digoxin   0.25 mg Oral Daily   empagliflozin   25 mg Oral Daily   FLUoxetine   40 mg Oral Daily   furosemide   80 mg Intravenous BID   insulin  aspart  0-15 Units Subcutaneous TID WC   methimazole   15 mg Oral TID   metoprolol  succinate  25 mg Oral BID   pantoprazole   40 mg Oral QPM   pravastatin   40 mg Oral QHS   Continuous Infusions:  PRN Meds: guaiFENesin -dextromethorphan , ipratropium, levalbuterol , ondansetron  **OR** ondansetron  (ZOFRAN ) IV, zolpidem    Vital Signs    Vitals:   09/19/23 2333 09/20/23 0443 09/20/23 0535 09/20/23 0822  BP: 93/76 (!) 116/57  95/73  Pulse: (!) 114 (!) 138    Resp: 17 17  18   Temp: 97.7 F (36.5 C) 98 F (36.7 C)  98.6 F (37 C)  TempSrc: Oral Oral  Oral  SpO2: 99% 98%  97%  Weight:   109 kg   Height:        Intake/Output Summary (Last 24 hours) at 09/20/2023 1105 Last data filed at 09/19/2023 2013 Gross per 24 hour  Intake 960 ml  Output 3775 ml  Net -2815 ml   Filed Weights   09/17/23 0442 09/19/23 0348 09/20/23 0535  Weight: 113.4 kg 114 kg 109 kg    Physical Exam   GEN: Well nourished, well developed, in no acute distress.  HEENT: Grossly normal.  Neck: Supple, mildly elev JVP, no carotid bruits, or masses. Cardiac: IR, IR, tachy, 2/6 syst murmur along LSB/LLSB, no rubs or gallops. No clubbing, cyanosis, 2+ bilat lowe leg edema.  Radials 2+, DP/PT 2+ and equal bilaterally.  Respiratory:  Respirations regular and unlabored, clear to auscultation bilaterally. GI: Soft, nontender, nondistended, BS + x 4. MS: no deformity or atrophy. Skin: warm and dry, no  rash. Neuro:  Strength and sensation are intact. Psych: AAOx3.  Normal affect.  Labs    Chemistry Recent Labs  Lab 09/18/23 0400 09/19/23 0303 09/20/23 0603  NA 138 140 135  K 3.9 4.4 3.4*  CL 104 104 96*  CO2 21* 26 26  GLUCOSE 153* 136* 151*  BUN 27* 31* 34*  CREATININE 0.89 1.03 0.99  CALCIUM 8.2* 8.3* 8.8*  PROT 5.9*  --   --   ALBUMIN 3.0*  --   --   AST 17  --   --   ALT 24  --   --   ALKPHOS 51  --   --   BILITOT 0.9  --   --   GFRNONAA >60 >60 >60  ANIONGAP 13 10 13      Hematology Recent Labs  Lab 09/18/23 0400 09/19/23 0303 09/20/23 0603  WBC 9.9 9.7 10.7*  RBC 4.07* 4.15* 4.62  HGB 11.5* 11.7* 13.0  HCT 35.5* 36.8* 39.2  MCV 87.2 88.7 84.8  MCH 28.3 28.2 28.1  MCHC 32.4 31.8 33.2  RDW 13.9 13.8 13.4  PLT 163 176 251    Cardiac Enzymes  Recent Labs  Lab 09/17/23 0444 09/17/23 0625  TROPONINIHS 23* 22*      BNP  Component Value Date/Time   BNP 2,499.1 (H) 09/17/2023 0444   HbA1c  Lab Results  Component Value Date   HGBA1C 6.1 (H) 08/20/2023    Radiology    CT Angio Chest/Abd/Pel for Dissection W and/or Wo Contrast Result Date: 09/17/2023 CLINICAL DATA:  Chest pain. EXAM: CT ANGIOGRAPHY CHEST, ABDOMEN AND PELVIS TECHNIQUE: Non-contrast CT of the chest was initially obtained. Multidetector CT imaging through the chest, abdomen and pelvis was performed using the standard protocol during bolus administration of intravenous contrast. Multiplanar reconstructed images and MIPs were obtained and reviewed to evaluate the vascular anatomy. RADIATION DOSE REDUCTION: This exam was performed according to the departmental dose-optimization program which includes automated exposure control, adjustment of the mA and/or kV according to patient size and/or use of iterative reconstruction technique. CONTRAST:  OMNIPAQUE  IOHEXOL  350 MG/ML SOLN COMPARISON:  August 20, 2023. FINDINGS: CTA CHEST FINDINGS Cardiovascular: Preferential opacification of the  thoracic aorta. No evidence of thoracic aortic aneurysm or dissection. Normal heart size. No pericardial effusion. Aortic valve calcifications are noted. Coronary artery calcifications are noted. Mediastinum/Nodes: No enlarged mediastinal, hilar, or axillary lymph nodes. Thyroid  gland, trachea, and esophagus demonstrate no significant findings. Lungs/Pleura: Small bilateral pleural effusions are noted with minimal adjacent subsegmental atelectasis. No pneumothorax is noted. Musculoskeletal: No chest wall abnormality. No acute or significant osseous findings. Review of the MIP images confirms the above findings. CTA ABDOMEN AND PELVIS FINDINGS VASCULAR Aorta: Atherosclerosis of abdominal aorta is noted without aneurysm or dissection. Celiac: Patent without evidence of aneurysm, dissection, vasculitis or significant stenosis. SMA: Patent without evidence of aneurysm, dissection, vasculitis or significant stenosis. Renals: Both renal arteries are patent without evidence of aneurysm, dissection, vasculitis, fibromuscular dysplasia or significant stenosis. IMA: Patent without evidence of aneurysm, dissection, vasculitis or significant stenosis. Inflow: Patent without evidence of aneurysm, dissection, vasculitis or significant stenosis. Veins: No obvious venous abnormality within the limitations of this arterial phase study. Review of the MIP images confirms the above findings. NON-VASCULAR Hepatobiliary: No focal liver abnormality is seen. No gallstones, gallbladder wall thickening, or biliary dilatation. Pancreas: Fatty replacement of the pancreas is noted. Spleen: Normal in size without focal abnormality. Adrenals/Urinary Tract: Adrenal glands are unremarkable. Large calcified renal cyst is noted in upper pole which is unchanged. Smaller cyst is seen more inferiorly in right kidney. No follow-up is required for these. No hydronephrosis or renal obstruction. Urinary bladder is unremarkable. Stomach/Bowel: Status post  gastric bypass. Appendix appears normal. No evidence of bowel obstruction or inflammation. Lymphatic: No adenopathy. Reproductive: Prostate is unremarkable. Other: Small fat containing periumbilical hernia.  No ascites. Musculoskeletal: Status post right total hip arthroplasty. No acute osseous abnormality is noted. Review of the MIP images confirms the above findings. IMPRESSION: No evidence of thoracic or abdominal aortic dissection or aneurysm. Small pleural effusions are noted. Coronary artery calcifications are noted. Aortic Atherosclerosis (ICD10-I70.0). Electronically Signed   By: Lynwood Landy Raddle M.D.   On: 09/17/2023 08:41   DG Chest 2 View Result Date: 09/17/2023 CLINICAL DATA:  Chest pains and coughing. EXAM: CHEST - 2 VIEW COMPARISON:  Portable chest 08/20/2023 FINDINGS: 5:05 a.m. There is mild cardiomegaly. There is central vascular prominence and flow cephalization and increased mild generalized interstitial consolidation consistent with edema, with small pleural effusions beginning to blunt the sulci. No alveolar infiltrate is seen. The mediastinum is normally outlined. Interval right PICC removal. Thoracic cage is intact. IMPRESSION: Findings consistent with CHF and mild interstitial edema. Small pleural effusions. Electronically Signed   By: Francis  Chesser M.D.   On: 09/17/2023 06:13     Telemetry    Afib 1-teens to 130's - Personally Reviewed  Cardiac Studies   2D Echocardiogram 12.11.2024  1. Left ventricular ejection fraction, by estimation, is 20 to 25%. The  left ventricle has severely decreased function. The left ventricle  demonstrates global hypokinesis. The left ventricular internal cavity size  was moderately dilated. Left  ventricular diastolic parameters are indeterminate.   2. Right ventricular systolic function is normal. The right ventricular  size is normal.   3. Left atrial size was moderately dilated.   4. The mitral valve is normal in structure. Mild to  moderate mitral valve  regurgitation. No evidence of mitral stenosis.   5. The aortic valve is normal in structure. There is moderate  calcification of the aortic valve. Aortic valve regurgitation is mild.  Moderate aortic valve stenosis. Aortic valve area, by VTI measures 0.97  cm. Aortic valve mean gradient measures 16.0  mmHg. Aortic valve Vmax measures 2.81 m/s.   6. The inferior vena cava is normal in size with greater than 50%  respiratory variability, suggesting right atrial pressure of 3 mmHg.  _____________    Patient Profile     68 y.o. male w/ a h/o HFrEF/NICM, persistent Afib, amio induced thyrotoxicosis, moderate AS, LBBB, DMII, and obesity s/p gastric bypass, who was admitted 09/17/2023 w Afib RVR and volume overload.  Assessment & Plan    1.  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 Renal fxn stable, creat 0.99.  Minus 2.7L yesterday, minus 3L since admission.  Wt recorded as down to 109 kg from 114 kg yesterday - trend likely inaccurate as he was weighed standing for the first time this AM, other wts were bedscale.  Feels well.  Still w/ lower ext edema on exam.  Cont IV lasix , ? blocker, jardiance , and digoxin .  BP intermittently soft - no arb/arni.  CHF/LV dysfxn likely driven by afib.  Plan for DCCV as outlined below.  If EF doesn't recover w/ restoration of sinus rhythm, will require cath in the future.  2.  Afib w/ RVR:  Dx in 11/2019.  Prev on amio, however d/c'd 08/2023 in setting of recurrent Afib and amio-induced thyrotoxicosis.  Initially converted to sinus prior to discharge on ? blocker, however admitted 1/8 w/ recurrent AFib RVR in setting of being d/c'd on methimazole  5 mg TID instead of 15 mg TID, and never got prednisone .  Remains in afib in the 1-teens to 130's.  Feels well as above.  Initial plan for TEE/DCCV 1/10, however cancelled due to last dose of mounjaro on 1/7.   He is on schedule for TEE/DCCV on 1/14 (believes he might have missed a few doses of eliquis  as outpt).  Cont ? blocker, digoxin , eliquis .  3.  Hyperthyroidism:  in setting of amio use.  As above, amio now d/c'd.  Remains on methimazole  15 mg TID, prednisone , ? blocker.  4.  Moderate AS:  moderate AS by echo.  F/u imaging as outpt in 1-2 yrs.  5.  DMII:  Per IM.  6.  Hypokalemia:  supp.  Signed, Lonni Meager, NP  09/20/2023, 11:05 AM    For questions or updates, please contact   Please consult [email or web address] for contact info under Cardiology/STEMI.

## 2023-09-20 NOTE — Progress Notes (Signed)
 PROGRESS NOTE    William [name].   William [name] DOB: 28-Jul-1956  DOA: 09/17/2023 Date of Service: 09/20/23 which is hospital day 3  PCP: Fernande Ophelia JINNY DOUGLAS, William [name]    Hospital course / significant events:   HPI:  William [name]. is a 68 y.o. male with medical history significant of HFrEF, atrial fibrillation, type 2 diabetes, aortic stenosis, history of gastric bypass, amiodarone  induced hyperthyroidism presenting with cough, chest pain. Had been admitted 08/2023 for decompensated heart failure, A-fib with RVR, amiodarone  induced hypothyroidism. Has been on methimazole , prednisone  taper, Eliquis , metoprolol , Lasix . Patient states he was discharged with a cough that progressively improved but then worsened acutely over the past 1 to 2 weeks. Has had mild nonspecific chest pain over the past few days.    01/08: admitted for chest pain r/o ACS, exacerbation HFrEF, Afib w/ RVR - given soft BP have held rate control and diuresing w/ caution. Cardiology consulted - starting IV digoxin  and transition to po tomorrow, cautious lasix , GDMT limited by hypotension. Of note, taking methimazole  5 mg tid not 15 mg tid as rx.  01/09: HR still elevated, cardio recs for DCCV planning this for tomorrow  01/10: delay in DCCV - cannot safely sedate given recent mounjaro per anesthesia - at this point given high risk for general anesthesia planis to wait the standard 7 days from last dose which puts him at 09/23/23 unless he decompensates in the meantime.  01/11: continuing diuresis, Planning for TEE/DCCV on 1/14 after Mounjaro has been held for 1 week. BP remains low      Consultants:  Cardiology [facility])  Procedures/Surgeries: none      ASSESSMENT & PLAN:   Atrial fibrillation with RVR History of PAF, 2021  suspected type 2 amiodarone  induced thyrotoxicity dx 08/2023, then was taking a lower dose of methimazole  than prescribed and also did not get prednisone  at home.  Blood pressure has been borderline  low, limiting rate control  Cardiology following  Continue digoxin  for rate control.  Continue Toprol  XL 25 mg daily, would not increase dose with soft BP.  Continue Eliquis  5 mg bid.  Continue hyperthyroidism treatment with methimazole  and prednisone  DCCV delayed d/t concerns w/ anesthesia / recent Mounjaro use, would need to wait 7 days from last dose - cardiology and anesthesiology to determine next best steps, if he deompensates may need to have conversion under general anesthesia  Ultimately, plan eval for AF ablation.   Acute chest wall pain Nonspecific likely multifactorial in setting of HF, Afib No ACS per cardiology monitor   Acute on chronic HFrEF (heart failure with reduced ejection fraction) (HCC) 2D echo December 2024 with a EF of 20 to 25% Cardiology following Digoxin  IV --> po per cardiology Continue Lasix  IV Low blood pressure limiting GDMT    Hyperthyroidism admission December 2024 for amiodarone  induced hypothyroidism On methimazole  and recent course of prednisone  TSH <0.10  Free T4 4.66  Will continue methimazole  S/p stress dose of hydrocortisone  in the ER out of concern for hypotension and ? Adrenal insufficiency    Cough, question URI  Noted recurrent cough over multiple weeks with recent evaluation for issues including A-fib with RVR, acute on chronic HFrEF, amiodarone  induced hyperthyroidism CT of the chest on admission grossly stable apart from pleural effusions Add as needed DuoNebs PPI Consider pulmonary follow-up if persists given prior amiodarone  toxicity with potential for pulmonary impact   GERD (gastroesophageal reflux disease) PPI   Type 2 diabetes mellitus without complication (  HCC) Blood sugars 170s SSI  Monitor   Class 1 obesity based on BMI: Body mass index is 34.87 kg/m.  Underweight - under 18  overweight - 25 to 29 obese - 30 or more Class 1 obesity: BMI of 30.0 to 34 Class 2 obesity: BMI of 35.0 to 39 Class 3 obesity: BMI of  40.0 to 49 Super Morbid Obesity: BMI 50-59 Super-super Morbid Obesity: BMI 60+ Significantly low or high BMI is associated with higher medical risk.  Weight management advised as adjunct to other disease management and risk reduction treatments    DVT prophylaxis: eliquis  IV fluids: no continuous IV fluids  Nutrition: cardaic diet Central lines / invasive devices: noen  Code Status: FULL CODE ACP documentation reviewed:  none on file in [facility]  TOC needs: TBD but expect d/c back home with no home health needs  Barriers to dispo / significant pending items: DCCV per cardiology and anesthesia recs as above             Subjective / Brief ROS:  Patient reports no concerns at this time  Still some dry cough  Denies CP/SOB. Pain controlled.  Denies new weakness.  Tolerating diet.  Reports no concerns w/ urination/defecation.   Family Communication: none at this time     Objective Findings:  Vitals:   09/20/23 0443 09/20/23 0535 09/20/23 0822 09/20/23 1232  BP: (!) 116/57  95/73 (!) 85/61  Pulse: (!) 138   (!) 126  Resp: 17  18 16   Temp: 98 F (36.7 C)  98.6 F (37 C) 97.7 F (36.5 C)  TempSrc: Oral  Oral Oral  SpO2: 98%  97% 100%  Weight:  109 kg    Height:        Intake/Output Summary (Last 24 hours) at 09/20/2023 1444 Last data filed at 09/19/2023 2013 Gross per 24 hour  Intake 480 ml  Output 2275 ml  Net -1795 ml    Filed Weights   09/17/23 0442 09/19/23 0348 09/20/23 0535  Weight: 113.4 kg 114 kg 109 kg    Examination:  Physical Exam Constitutional:      General: He is not in acute distress. Cardiovascular:     Rate and Rhythm: Tachycardia present. Rhythm irregular.  Pulmonary:     Breath sounds: Normal breath sounds.  Musculoskeletal:     Right lower leg: No edema.     Left lower leg: No edema.  Neurological:     General: No focal deficit present.     Mental Status: He is alert and oriented to person, place, and time.  Psychiatric:         Mood and Affect: Mood normal.        Behavior: Behavior normal.          Scheduled Medications:   apixaban   5 mg Oral BID   benzonatate   200 mg Oral TID   digoxin   0.25 mg Oral Daily   empagliflozin   25 mg Oral Daily   FLUoxetine   40 mg Oral Daily   furosemide   80 mg Intravenous BID   insulin  aspart  0-15 Units Subcutaneous TID WC   methimazole   15 mg Oral TID   metoprolol  succinate  25 mg Oral BID   pantoprazole   40 mg Oral QPM   pravastatin   40 mg Oral QHS    Continuous Infusions:    PRN Medications:  guaiFENesin -dextromethorphan , ipratropium, levalbuterol , ondansetron  **OR** ondansetron  (ZOFRAN ) IV, zolpidem   Antimicrobials from admission:  Anti-infectives (From admission, onward)  None           Data Reviewed:  I have personally reviewed the following...  CBC: Recent Labs  Lab 09/17/23 0444 09/18/23 0400 09/19/23 0303 09/20/23 0603  WBC 11.6* 9.9 9.7 10.7*  HGB 12.5* 11.5* 11.7* 13.0  HCT 39.9 35.5* 36.8* 39.2  MCV 91.3 87.2 88.7 84.8  PLT 151 163 176 251   Basic Metabolic Panel: Recent Labs  Lab 09/17/23 0444 09/18/23 0400 09/18/23 0406 09/19/23 0303 09/20/23 0603  NA 136 138  --  140 135  K 4.3 3.9  --  4.4 3.4*  CL 104 104  --  104 96*  CO2 21* 21*  --  26 26  GLUCOSE 176* 153*  --  136* 151*  BUN 23 27*  --  31* 34*  CREATININE 0.78 0.89  --  1.03 0.99  CALCIUM 8.2* 8.2*  --  8.3* 8.8*  MG  --   --  2.3  --   --    GFR: Estimated Creatinine Clearance: 92.2 mL/min (by C-G formula based on SCr of 0.99 mg/dL). Liver Function Tests: Recent Labs  Lab 09/18/23 0400  AST 17  ALT 24  ALKPHOS 51  BILITOT 0.9  PROT 5.9*  ALBUMIN 3.0*   No results for input(s): LIPASE, AMYLASE in the last 168 hours. No results for input(s): AMMONIA in the last 168 hours. Coagulation Profile: No results for input(s): INR, PROTIME in the last 168 hours. Cardiac Enzymes: No results for input(s): CKTOTAL, CKMB, CKMBINDEX,  TROPONINI in the last 168 hours. BNP (last 3 results) No results for input(s): PROBNP in the last 8760 hours. HbA1C: No results for input(s): HGBA1C in the last 72 hours. CBG: Recent Labs  Lab 09/19/23 1220 09/19/23 1551 09/19/23 2122 09/20/23 0819 09/20/23 1231  GLUCAP 173* 279* 213* 133* 171*   Lipid Profile: No results for input(s): CHOL, HDL, LDLCALC, TRIG, CHOLHDL, LDLDIRECT in the last 72 hours. Thyroid  Function Tests: No results for input(s): TSH, T4TOTAL, FREET4, T3FREE, THYROIDAB in the last 72 hours.  Anemia Panel: No results for input(s): VITAMINB12, FOLATE, FERRITIN, TIBC, IRON, RETICCTPCT in the last 72 hours. Most Recent Urinalysis On File:     Component Value Date/Time   COLORURINE YELLOW (A) 08/07/2016 1337   APPEARANCEUR CLEAR (A) 08/07/2016 1337   LABSPEC 1.020 08/07/2016 1337   PHURINE 5.0 08/07/2016 1337   GLUCOSEU NEGATIVE 08/07/2016 1337   HGBUR NEGATIVE 08/07/2016 1337   BILIRUBINUR NEGATIVE 08/07/2016 1337   KETONESUR NEGATIVE 08/07/2016 1337   PROTEINUR NEGATIVE 08/07/2016 1337   NITRITE NEGATIVE 08/07/2016 1337   LEUKOCYTESUR NEGATIVE 08/07/2016 1337   Sepsis Labs: @LABRCNTIP (procalcitonin:4,lacticidven:4) Microbiology: Recent Results (from the past 240 hours)  Resp panel by RT-PCR (RSV, Flu A&B, Covid) Anterior Nasal Swab     Status: None   Collection Time: 09/17/23  5:55 AM   Specimen: Anterior Nasal Swab  Result Value Ref Range Status   SARS Coronavirus 2 by RT PCR NEGATIVE NEGATIVE Final    Comment: (NOTE) SARS-CoV-2 target nucleic acids are NOT DETECTED.  The SARS-CoV-2 RNA is generally detectable in upper respiratory specimens during the acute phase of infection. The lowest concentration of SARS-CoV-2 viral copies this assay can detect is 138 copies/mL. A negative result does not preclude SARS-Cov-2 infection and should not be used as the sole basis for treatment or other patient management  decisions. A negative result may occur with  improper specimen collection/handling, submission of specimen other than nasopharyngeal swab, presence of viral mutation(s) within the  areas targeted by this assay, and inadequate number of viral copies(<138 copies/mL). A negative result must be combined with clinical observations, patient history, and epidemiological information. The expected result is Negative.  Fact Sheet for Patients:  bloggercourse.com  Fact Sheet for Healthcare Providers:  seriousbroker.it  This test is no t yet approved or cleared by the [place]  FDA and  has been authorized for detection and/or diagnosis of SARS-CoV-2 by FDA under an Emergency Use Authorization (EUA). This EUA will remain  in effect (meaning this test can be used) for the duration of the COVID-19 declaration under Section 564(b)(1) of the Act, 21 U.S.C.section 360bbb-3(b)(1), unless the authorization is terminated  or revoked sooner.       Influenza A by PCR NEGATIVE NEGATIVE Final   Influenza B by PCR NEGATIVE NEGATIVE Final    Comment: (NOTE) The Xpert Xpress SARS-CoV-2/FLU/RSV plus assay is intended as an aid in the diagnosis of influenza from Nasopharyngeal swab specimens and should not be used as a sole basis for treatment. Nasal washings and aspirates are unacceptable for Xpert Xpress SARS-CoV-2/FLU/RSV testing.  Fact Sheet for Patients: bloggercourse.com  Fact Sheet for Healthcare Providers: seriousbroker.it  This test is not yet approved or cleared by the [place]  FDA and has been authorized for detection and/or diagnosis of SARS-CoV-2 by FDA under an Emergency Use Authorization (EUA). This EUA will remain in effect (meaning this test can be used) for the duration of the COVID-19 declaration under Section 564(b)(1) of the Act, 21 U.S.C. section 360bbb-3(b)(1), unless the  authorization is terminated or revoked.     Resp Syncytial Virus by PCR NEGATIVE NEGATIVE Final    Comment: (NOTE) Fact Sheet for Patients: bloggercourse.com  Fact Sheet for Healthcare Providers: seriousbroker.it  This test is not yet approved or cleared by the [place]  FDA and has been authorized for detection and/or diagnosis of SARS-CoV-2 by FDA under an Emergency Use Authorization (EUA). This EUA will remain in effect (meaning this test can be used) for the duration of the COVID-19 declaration under Section 564(b)(1) of the Act, 21 U.S.C. section 360bbb-3(b)(1), unless the authorization is terminated or revoked.  Performed at [facility], [address] [place], [place] [address]   Respiratory (~20 pathogens) panel by PCR     Status: None   Collection Time: 09/19/23  6:50 PM   Specimen: Nasopharyngeal Swab; Respiratory  Result Value Ref Range Status   Adenovirus NOT DETECTED NOT DETECTED Final   Coronavirus 229E NOT DETECTED NOT DETECTED Final    Comment: (NOTE) The Coronavirus on the Respiratory Panel, DOES NOT test for the novel  Coronavirus (2019 nCoV)    Coronavirus HKU1 NOT DETECTED NOT DETECTED Final   Coronavirus NL63 NOT DETECTED NOT DETECTED Final   Coronavirus OC43 NOT DETECTED NOT DETECTED Final   Metapneumovirus NOT DETECTED NOT DETECTED Final   Rhinovirus / Enterovirus NOT DETECTED NOT DETECTED Final   Influenza A NOT DETECTED NOT DETECTED Final   Influenza B NOT DETECTED NOT DETECTED Final   Parainfluenza Virus 1 NOT DETECTED NOT DETECTED Final   Parainfluenza Virus 2 NOT DETECTED NOT DETECTED Final   Parainfluenza Virus 3 NOT DETECTED NOT DETECTED Final   Parainfluenza Virus 4 NOT DETECTED NOT DETECTED Final   Respiratory Syncytial Virus NOT DETECTED NOT DETECTED Final   Bordetella pertussis NOT DETECTED NOT DETECTED Final   Bordetella Parapertussis NOT DETECTED NOT DETECTED Final    Chlamydophila pneumoniae NOT DETECTED NOT DETECTED Final   Mycoplasma pneumoniae NOT DETECTED NOT  DETECTED Final    Comment: Performed at [facility], [address], [place], [place] [address]      Radiology Studies last 3 days: CT Angio Chest/Abd/Pel for Dissection W and/or Wo Contrast Result Date: 09/17/2023 CLINICAL DATA:  Chest pain. EXAM: CT ANGIOGRAPHY CHEST, ABDOMEN AND PELVIS TECHNIQUE: Non-contrast CT of the chest was initially obtained. Multidetector CT imaging through the chest, abdomen and pelvis was performed using the standard protocol during bolus administration of intravenous contrast. Multiplanar reconstructed images and MIPs were obtained and reviewed to evaluate the vascular anatomy. RADIATION DOSE REDUCTION: This exam was performed according to the departmental dose-optimization program which includes automated exposure control, adjustment of the mA and/or kV according to patient size and/or use of iterative reconstruction technique. CONTRAST:  OMNIPAQUE  IOHEXOL  350 MG/ML SOLN COMPARISON:  August 20, 2023. FINDINGS: CTA CHEST FINDINGS Cardiovascular: Preferential opacification of the thoracic aorta. No evidence of thoracic aortic aneurysm or dissection. Normal heart size. No pericardial effusion. Aortic valve calcifications are noted. Coronary artery calcifications are noted. Mediastinum/Nodes: No enlarged mediastinal, hilar, or axillary lymph nodes. Thyroid  gland, trachea, and esophagus demonstrate no significant findings. Lungs/Pleura: Small bilateral pleural effusions are noted with minimal adjacent subsegmental atelectasis. No pneumothorax is noted. Musculoskeletal: No chest wall abnormality. No acute or significant osseous findings. Review of the MIP images confirms the above findings. CTA ABDOMEN AND PELVIS FINDINGS VASCULAR Aorta: Atherosclerosis of abdominal aorta is noted without aneurysm or dissection. Celiac: Patent without evidence of aneurysm, dissection,  vasculitis or significant stenosis. SMA: Patent without evidence of aneurysm, dissection, vasculitis or significant stenosis. Renals: Both renal arteries are patent without evidence of aneurysm, dissection, vasculitis, fibromuscular dysplasia or significant stenosis. IMA: Patent without evidence of aneurysm, dissection, vasculitis or significant stenosis. Inflow: Patent without evidence of aneurysm, dissection, vasculitis or significant stenosis. Veins: No obvious venous abnormality within the limitations of this arterial phase study. Review of the MIP images confirms the above findings. NON-VASCULAR Hepatobiliary: No focal liver abnormality is seen. No gallstones, gallbladder wall thickening, or biliary dilatation. Pancreas: Fatty replacement of the pancreas is noted. Spleen: Normal in size without focal abnormality. Adrenals/Urinary Tract: Adrenal glands are unremarkable. Large calcified renal cyst is noted in upper pole which is unchanged. Smaller cyst is seen more inferiorly in right kidney. No follow-up is required for these. No hydronephrosis or renal obstruction. Urinary bladder is unremarkable. Stomach/Bowel: Status post gastric bypass. Appendix appears normal. No evidence of bowel obstruction or inflammation. Lymphatic: No adenopathy. Reproductive: Prostate is unremarkable. Other: Small fat containing periumbilical hernia.  No ascites. Musculoskeletal: Status post right total hip arthroplasty. No acute osseous abnormality is noted. Review of the MIP images confirms the above findings. IMPRESSION: No evidence of thoracic or abdominal aortic dissection or aneurysm. Small pleural effusions are noted. Coronary artery calcifications are noted. Aortic Atherosclerosis (ICD10-I70.0). Electronically Signed   By: Lynwood Landy Raddle M.D.   On: 09/17/2023 08:41   DG Chest 2 View Result Date: 09/17/2023 CLINICAL DATA:  Chest pains and coughing. EXAM: CHEST - 2 VIEW COMPARISON:  Portable chest 08/20/2023 FINDINGS: 5:05  a.m. There is mild cardiomegaly. There is central vascular prominence and flow cephalization and increased mild generalized interstitial consolidation consistent with edema, with small pleural effusions beginning to blunt the sulci. No alveolar infiltrate is seen. The mediastinum is normally outlined. Interval right PICC removal. Thoracic cage is intact. IMPRESSION: Findings consistent with CHF and mild interstitial edema. Small pleural effusions. Electronically Signed   By: Francis Beatriz HERO.D.  On: 09/17/2023 06:13        Errica Dutil, DO [facility] 09/20/2023, 2:44 PM    Dictation software may have been used to generate the above note. Typos may occur and escape review in typed/dictated notes. Please contact Dr Marsa directly for clarity if needed.  Staff may message me via secure chat in Epic  but this may not receive an immediate response,  please page me for urgent matters!  If 7PM-7AM, please contact night coverage [email or web address]

## 2023-09-20 NOTE — Plan of Care (Signed)
  Problem: Coping: Goal: Ability to adjust to condition or change in health will improve Outcome: Progressing   Problem: Fluid Volume: Goal: Ability to maintain a balanced intake and output will improve Outcome: Progressing   Problem: Metabolic: Goal: Ability to maintain appropriate glucose levels will improve Outcome: Progressing   Problem: Nutritional: Goal: Maintenance of adequate nutrition will improve Outcome: Progressing Goal: Progress toward achieving an optimal weight will improve Outcome: Progressing   Problem: Skin Integrity: Goal: Risk for impaired skin integrity will decrease Outcome: Progressing   Problem: Tissue Perfusion: Goal: Adequacy of tissue perfusion will improve Outcome: Progressing   Problem: Education: Goal: Knowledge of General Education information will improve Description: Including pain rating scale, medication(s)/side effects and non-pharmacologic comfort measures Outcome: Progressing

## 2023-09-21 DIAGNOSIS — I5023 Acute on chronic systolic (congestive) heart failure: Secondary | ICD-10-CM | POA: Diagnosis not present

## 2023-09-21 DIAGNOSIS — I4891 Unspecified atrial fibrillation: Secondary | ICD-10-CM | POA: Diagnosis not present

## 2023-09-21 LAB — BASIC METABOLIC PANEL
Anion gap: 15 (ref 5–15)
BUN: 39 mg/dL — ABNORMAL HIGH (ref 8–23)
CO2: 24 mmol/L (ref 22–32)
Calcium: 8.7 mg/dL — ABNORMAL LOW (ref 8.9–10.3)
Chloride: 94 mmol/L — ABNORMAL LOW (ref 98–111)
Creatinine, Ser: 1.02 mg/dL (ref 0.61–1.24)
GFR, Estimated: >60 mL/min (ref >60)
Glucose, Bld: 168 mg/dL — ABNORMAL HIGH (ref 70–99)
Potassium: 3 mmol/L — ABNORMAL LOW (ref 3.5–5.1)
Sodium: 133 mmol/L — ABNORMAL LOW (ref 135–145)

## 2023-09-21 LAB — CBC
HCT: 41.6 % (ref 39.0–52.0)
Hemoglobin: 13.9 g/dL (ref 13.0–17.0)
MCH: 28 pg (ref 26.0–34.0)
MCHC: 33.4 g/dL (ref 30.0–36.0)
MCV: 83.9 fL (ref 80.0–100.0)
Platelets: 288 10*3/uL (ref 150–400)
RBC: 4.96 MIL/uL (ref 4.22–5.81)
RDW: 13.2 % (ref 11.5–15.5)
WBC: 9.8 10*3/uL (ref 4.0–10.5)
nRBC: 0 % (ref 0.0–0.2)

## 2023-09-21 LAB — DIGOXIN LEVEL: Digoxin Level: 0.7 ng/mL — ABNORMAL LOW (ref 0.8–2.0)

## 2023-09-21 LAB — GLUCOSE, CAPILLARY
Glucose-Capillary: 197 mg/dL — ABNORMAL HIGH (ref 70–99)
Glucose-Capillary: 125 mg/dL — ABNORMAL HIGH (ref 70–99)
Glucose-Capillary: 157 mg/dL — ABNORMAL HIGH (ref 70–99)
Glucose-Capillary: 162 mg/dL — ABNORMAL HIGH (ref 70–99)

## 2023-09-21 LAB — MAGNESIUM: Magnesium: 2.1 mg/dL (ref 1.7–2.4)

## 2023-09-21 MED ORDER — POTASSIUM CHLORIDE CRYS ER 20 MEQ PO TBCR
40.0000 meq | EXTENDED_RELEASE_TABLET | Freq: Two times a day (BID) | ORAL | Status: AC
  Administered 2023-09-21 (×2): 40 meq via ORAL
  Filled 2023-09-21 (×2): qty 2

## 2023-09-21 NOTE — Plan of Care (Signed)

## 2023-09-21 NOTE — Progress Notes (Signed)
 Cardiology Progress Note   Patient Name: William [name]. Date of Encounter: 09/21/2023  Primary Cardiologist: Timothy Gollan, MD  Subjective   Feels well this morning.  Denies chest pain, dyspnea, or palpitations.  Notes ongoing good urine output, which has been frustrating for him-frequent trips to the bathroom. Objective   Inpatient Medications    Scheduled Meds:  apixaban   5 mg Oral BID   benzonatate   200 mg Oral TID   digoxin   0.25 mg Oral Daily   empagliflozin   25 mg Oral Daily   FLUoxetine   40 mg Oral Daily   furosemide   80 mg Intravenous BID   insulin  aspart  0-15 Units Subcutaneous TID WC   methimazole   15 mg Oral TID   metoprolol  succinate  25 mg Oral BID   pantoprazole   40 mg Oral QPM   potassium chloride   40 mEq Oral BID WC   pravastatin   40 mg Oral QHS   Continuous Infusions:  PRN Meds: guaiFENesin -dextromethorphan , ipratropium, levalbuterol , ondansetron  **OR** ondansetron  (ZOFRAN ) IV, zolpidem    Vital Signs    Vitals:   09/21/23 0548 09/21/23 0552 09/21/23 0747 09/21/23 1017  BP:  97/80 102/86 (!) 117/95  Pulse:  (!) 142 (!) 134 (!) 129  Resp:  20    Temp:  97.9 F (36.6 C) 98.7 F (37.1 C)   TempSrc:   Oral   SpO2:  99% 96% 98%  Weight: 110.7 kg     Height:       No intake or output data in the 24 hours ending 09/21/23 1135 Filed Weights   09/20/23 0535 09/21/23 0528 09/21/23 0548  Weight: 109 kg 110.7 kg 110.7 kg    Physical Exam   GEN: Well nourished, well developed, in no acute distress.  HEENT: Grossly normal.  Neck: Supple, no JVD, carotid bruits, or masses. Cardiac: IR, IR, tachy, 2/6 syst murmur along lsb/llsb, no rubs or gallops. No clubbing, cyanosis, 1+ bilat LE edema.  Radials 2+, DP/PT 2+ and equal bilaterally.  Respiratory:  Respirations regular and unlabored, clear to auscultation bilaterally. GI: Soft, nontender, nondistended, BS + x 4. MS: no deformity or atrophy. Skin: warm and dry, no rash. Neuro:  Strength and  sensation are intact. Psych: AAOx3.  Normal affect.  Labs    Chemistry Recent Labs  Lab 09/18/23 0400 09/19/23 0303 09/20/23 0603 09/21/23 0503  NA 138 140 135 133*  K 3.9 4.4 3.4* 3.0*  CL 104 104 96* 94*  CO2 21* 26 26 24   GLUCOSE 153* 136* 151* 168*  BUN 27* 31* 34* 39*  CREATININE 0.89 1.03 0.99 1.02  CALCIUM 8.2* 8.3* 8.8* 8.7*  PROT 5.9*  --   --   --   ALBUMIN 3.0*  --   --   --   AST 17  --   --   --   ALT 24  --   --   --   ALKPHOS 51  --   --   --   BILITOT 0.9  --   --   --   GFRNONAA >60 >60 >60 >60  ANIONGAP 13 10 13 15      Hematology Recent Labs  Lab 09/19/23 0303 09/20/23 0603 09/21/23 0503  WBC 9.7 10.7* 9.8  RBC 4.15* 4.62 4.96  HGB 11.7* 13.0 13.9  HCT 36.8* 39.2 41.6  MCV 88.7 84.8 83.9  MCH 28.2 28.1 28.0  MCHC 31.8 33.2 33.4  RDW 13.8 13.4 13.2  PLT 176 251 288  Cardiac Enzymes  Recent Labs  Lab 09/17/23 0444 09/17/23 0625  TROPONINIHS 23* 22*      BNP    Component Value Date/Time   BNP 2,499.1 (H) 09/17/2023 0444   HbA1c  Lab Results  Component Value Date   HGBA1C 6.1 (H) 08/20/2023    Radiology    No results found.   Telemetry    Afib, 130's - 140's  - Personally Reviewed  Cardiac Studies   2D Echocardiogram 12.11.2024   1. Left ventricular ejection fraction, by estimation, is 20 to 25%. The  left ventricle has severely decreased function. The left ventricle  demonstrates global hypokinesis. The left ventricular internal cavity size  was moderately dilated. Left  ventricular diastolic parameters are indeterminate.   2. Right ventricular systolic function is normal. The right ventricular  size is normal.   3. Left atrial size was moderately dilated.   4. The mitral valve is normal in structure. Mild to moderate mitral valve  regurgitation. No evidence of mitral stenosis.   5. The aortic valve is normal in structure. There is moderate  calcification of the aortic valve. Aortic valve regurgitation is mild.   Moderate aortic valve stenosis. Aortic valve area, by VTI measures 0.97  cm. Aortic valve mean gradient measures 16.0  mmHg. Aortic valve Vmax measures 2.81 m/s.   6. The inferior vena cava is normal in size with greater than 50%  respiratory variability, suggesting right atrial pressure of 3 mmHg.  _____________   Patient Profile     68 y.o. male w/ a h/o HFrEF/NICM, persistent Afib, amio induced thyrotoxicosis, moderate AS, LBBB, DMII, and obesity s/p gastric bypass, who was admitted 09/17/2023 w Afib RVR and volume overload.   Assessment & Plan    1.  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 Renal fxn stable, creat 1.02.  I/O not recorded yesterday.  Wt this AM 110.7 kg.  General he continues to feel well.  Still w/ 1+ bilateral lower ext edema on exam.  Cont IV lasix , ? blocker, jardiance , and digoxin .  BP intermittently soft - no arb/arni.  CHF/LV dysfxn likely driven by afib.  Plan for DCCV as outlined below.  If EF doesn't recover w/ restoration of sinus rhythm, will require cath in the future.   2.  Afib w/ RVR:  Dx in 11/2019.  Prev on amio, however d/c'd 08/2023 in setting of recurrent Afib and amio-induced thyrotoxicosis.  Initially converted to sinus prior to discharge on ? blocker, however admitted 1/8 w/ recurrent AFib RVR in setting of being d/c'd on methimazole  5 mg TID instead of 15 mg TID, and never got prednisone .  Remains in afib, rates faster this morning in the 130s to 140s.  Denies palpitations though feels fatigued.  Initial plan for TEE/DCCV 1/10, however cancelled due to last dose of mounjaro on 1/7.  He is on schedule for TEE/DCCV on 1/14 (believes he might have missed a few doses of eliquis  as outpt).  Cont ? blocker, digoxin , eliquis .   3.  Hyperthyroidism:  in setting of amio use.  As above, amio now d/c'd.  Remains on methimazole  15 mg TID, prednisone , ? blocker.   4.   Moderate AS:  moderate AS by echo.  F/u imaging as outpt in 1-2 yrs.   5.  DMII:  Per IM.   6.  Hypokalemia:  K 3.0 this AM.  Mg nl.  Supplementation ordered.  Signed, Lonni Meager,  NP  09/21/2023, 11:35 AM    For questions or updates, please contact   Please consult [email or web address] for contact info under Cardiology/STEMI.

## 2023-09-21 NOTE — Plan of Care (Signed)
  Problem: Education: Goal: Ability to describe self-care measures that may prevent or decrease complications (Diabetes Survival Skills Education) will improve Outcome: Progressing Goal: Individualized Educational Video(s) Outcome: Progressing   Problem: Coping: Goal: Ability to adjust to condition or change in health will improve Outcome: Progressing   Problem: Fluid Volume: Goal: Ability to maintain a balanced intake and output will improve Outcome: Progressing   Problem: Health Behavior/Discharge Planning: Goal: Ability to identify and utilize available resources and services will improve Outcome: Progressing Goal: Ability to manage health-related needs will improve Outcome: Progressing   Problem: Metabolic: Goal: Ability to maintain appropriate glucose levels will improve Outcome: Progressing   Problem: Nutritional: Goal: Maintenance of adequate nutrition will improve Outcome: Progressing Goal: Progress toward achieving an optimal weight will improve Outcome: Progressing   Problem: Skin Integrity: Goal: Risk for impaired skin integrity will decrease Outcome: Progressing   Problem: Education: Goal: Knowledge of General Education information will improve Description: Including pain rating scale, medication(s)/side effects and non-pharmacologic comfort measures Outcome: Progressing   Problem: Health Behavior/Discharge Planning: Goal: Ability to manage health-related needs will improve Outcome: Progressing   Problem: Clinical Measurements: Goal: Ability to maintain clinical measurements within normal limits will improve Outcome: Progressing Goal: Will remain free from infection Outcome: Progressing Goal: Diagnostic test results will improve Outcome: Progressing Goal: Respiratory complications will improve Outcome: Progressing Goal: Cardiovascular complication will be avoided Outcome: Progressing   Problem: Activity: Goal: Risk for activity intolerance will  decrease Outcome: Progressing   Problem: Nutrition: Goal: Adequate nutrition will be maintained Outcome: Progressing   Problem: Elimination: Goal: Will not experience complications related to bowel motility Outcome: Progressing Goal: Will not experience complications related to urinary retention Outcome: Progressing   Problem: Pain Management: Goal: General experience of comfort will improve Outcome: Progressing   Problem: Safety: Goal: Ability to remain free from injury will improve Outcome: Progressing   Problem: Skin Integrity: Goal: Risk for impaired skin integrity will decrease Outcome: Progressing   Problem: Education: Goal: Ability to demonstrate management of disease process will improve Outcome: Progressing Goal: Ability to verbalize understanding of medication therapies will improve Outcome: Progressing Goal: Individualized Educational Video(s) Outcome: Progressing   Problem: Activity: Goal: Capacity to carry out activities will improve Outcome: Progressing   Problem: Cardiac: Goal: Ability to achieve and maintain adequate cardiopulmonary perfusion will improve Outcome: Progressing

## 2023-09-21 NOTE — Progress Notes (Signed)
 PROGRESS NOTE    William [name].   FMW:[id number] DOB: [birth year]  DOA: 09/17/2023 Date of Service: 09/21/23 which is hospital day 4  PCP: Fernande Ophelia JINNY DOUGLAS, MD    Hospital course / significant events:   HPI:  William Brum. is a 68 y.o. male with medical history significant of HFrEF, atrial fibrillation, type 2 diabetes, aortic stenosis, history of gastric bypass, amiodarone  induced hyperthyroidism presenting with cough, chest pain. Had been admitted 08/2023 for decompensated heart failure, A-fib with RVR, amiodarone  induced hypothyroidism. Has been on methimazole , prednisone  taper, Eliquis , metoprolol , Lasix . Patient states he was discharged with a cough that progressively improved but then worsened acutely over the past 1 to 2 weeks. Has had mild nonspecific chest pain over the past few days.    01/08: admitted for chest pain r/o ACS, exacerbation HFrEF, Afib w/ RVR - given soft BP have held rate control and diuresing w/ caution. Cardiology consulted - starting IV digoxin  and transition to po tomorrow, cautious lasix , GDMT limited by hypotension. Of note, taking methimazole  5 mg tid not 15 mg tid as rx.  01/09: HR still elevated, cardio recs for DCCV planning this for tomorrow  01/10: delay in DCCV - cannot safely sedate given recent mounjaro per anesthesia - at this point given high risk for general anesthesia planis to wait the standard 7 days from last dose which puts him at 09/23/23 unless he decompensates in the meantime.  01/11-01/12: continuing diuresis, Planning for TEE/DCCV on 1/14 after Mounjaro has been held for 1 week. BP remains low      Consultants:  Cardiology [facility])  Procedures/Surgeries: none      ASSESSMENT & PLAN:   Atrial fibrillation with RVR History of PAF, 2021  suspected type 2 amiodarone  induced thyrotoxicity dx 08/2023, then was taking a lower dose of methimazole  than prescribed and also did not get prednisone  at home.  Blood pressure has been  borderline low, limiting rate control  Cardiology following  Continue digoxin  for rate control.  Continue Toprol  XL 25 mg daily, would not increase dose with soft BP.  Continue Eliquis  5 mg bid.  Continue hyperthyroidism treatment with methimazole  and prednisone  DCCV delayed d/t concerns w/ anesthesia / recent Mounjaro use, would need to wait 7 days from last dose - cardiology and anesthesiology to determine next best steps, if he deompensates may need to have conversion under general anesthesia  Ultimately, plan eval for AF ablation.   Acute chest wall pain Nonspecific likely multifactorial in setting of HF, Afib No ACS per cardiology monitor   Acute on chronic HFrEF (heart failure with reduced ejection fraction) (HCC) 2D echo December 2024 with a EF of 20 to 25% Cardiology following Continue Lasix  IV Continue beta-blocker, Jardiance , and digoxin  Low blood pressure limiting GDMT Needs repeat echo in 3 months to assess for recovery of LVEF and ICD candidacy.     Hyperthyroidism admission December 2024 for amiodarone  induced hypothyroidism On methimazole  and recent course of prednisone  TSH <0.10  Free T4 4.66  Will continue methimazole  S/p stress dose of hydrocortisone  in the ER out of concern for hypotension and ? Adrenal insufficiency    Cough, question URI  Noted recurrent cough over multiple weeks with recent evaluation for issues including A-fib with RVR, acute on chronic HFrEF, amiodarone  induced hyperthyroidism CT of the chest on admission grossly stable apart from pleural effusions Add as needed DuoNebs PPI Consider pulmonary follow-up if persists given prior amiodarone  toxicity with potential for pulmonary impact  GERD (gastroesophageal reflux disease) PPI   Type 2 diabetes mellitus without complication (HCC) Blood sugars 170s SSI  Monitor  Hypokalemia Replace as needed Monitor BMP   Class 1 obesity based on BMI: Body mass index is 34.87 kg/m.  Underweight  - under 18  overweight - 25 to 29 obese - 30 or more Class 1 obesity: BMI of 30.0 to 34 Class 2 obesity: BMI of 35.0 to 39 Class 3 obesity: BMI of 40.0 to 49 Super Morbid Obesity: BMI 50-59 Super-super Morbid Obesity: BMI 60+ Significantly low or high BMI is associated with higher medical risk.  Weight management advised as adjunct to other disease management and risk reduction treatments    DVT prophylaxis: eliquis  IV fluids: no continuous IV fluids  Nutrition: cardaic diet Central lines / invasive devices: noen  Code Status: FULL CODE ACP documentation reviewed:  none on file in [facility]  TOC needs: TBD but expect d/c back home with no home health needs  Barriers to dispo / significant pending items: DCCV per cardiology and anesthesia recs as above             Subjective / Brief ROS:  Patient reports no concerns at this time  Still some dry cough about the same  Reports no concerns w/ urination/defecation.   Family Communication: family at bedside on rounds     Objective Findings:  Vitals:   09/21/23 0552 09/21/23 0747 09/21/23 1017 09/21/23 1310  BP: 97/80 102/86 (!) 117/95 101/75  Pulse: (!) 142 (!) 134 (!) 129 (!) 137  Resp: 20   (!) 31  Temp: 97.9 F (36.6 C) 98.7 F (37.1 C)  98.1 F (36.7 C)  TempSrc:  Oral  Oral  SpO2: 99% 96% 98% 95%  Weight:      Height:       No intake or output data in the 24 hours ending 09/21/23 1624   Filed Weights   09/20/23 0535 09/21/23 0528 09/21/23 0548  Weight: 109 kg 110.7 kg 110.7 kg    Examination:  Physical Exam Constitutional:      General: He is not in acute distress. Cardiovascular:     Rate and Rhythm: Tachycardia present. Rhythm irregular.  Pulmonary:     Breath sounds: Normal breath sounds.  Musculoskeletal:     Right lower leg: No edema.     Left lower leg: No edema.  Neurological:     General: No focal deficit present.     Mental Status: He is alert and oriented to person, place, and time.   Psychiatric:        Mood and Affect: Mood normal.        Behavior: Behavior normal.          Scheduled Medications:   apixaban   5 mg Oral BID   benzonatate   200 mg Oral TID   digoxin   0.25 mg Oral Daily   empagliflozin   25 mg Oral Daily   FLUoxetine   40 mg Oral Daily   furosemide   80 mg Intravenous BID   insulin  aspart  0-15 Units Subcutaneous TID WC   methimazole   15 mg Oral TID   metoprolol  succinate  25 mg Oral BID   pantoprazole   40 mg Oral QPM   pravastatin   40 mg Oral QHS    Continuous Infusions:    PRN Medications:  guaiFENesin -dextromethorphan , ipratropium, levalbuterol , ondansetron  **OR** ondansetron  (ZOFRAN ) IV, zolpidem   Antimicrobials from admission:  Anti-infectives (From admission, onward)    None  Data Reviewed:  I have personally reviewed the following...  CBC: Recent Labs  Lab 09/17/23 0444 09/18/23 0400 09/19/23 0303 09/20/23 0603 09/21/23 0503  WBC 11.6* 9.9 9.7 10.7* 9.8  HGB 12.5* 11.5* 11.7* 13.0 13.9  HCT 39.9 35.5* 36.8* 39.2 41.6  MCV 91.3 87.2 88.7 84.8 83.9  PLT 151 163 176 251 288   Basic Metabolic Panel: Recent Labs  Lab 09/17/23 0444 09/18/23 0400 09/18/23 0406 09/19/23 0303 09/20/23 0603 09/21/23 0503 09/21/23 0954  NA 136 138  --  140 135 133*  --   K 4.3 3.9  --  4.4 3.4* 3.0*  --   CL 104 104  --  104 96* 94*  --   CO2 21* 21*  --  26 26 24   --   GLUCOSE 176* 153*  --  136* 151* 168*  --   BUN 23 27*  --  31* 34* 39*  --   CREATININE 0.78 0.89  --  1.03 0.99 1.02  --   CALCIUM 8.2* 8.2*  --  8.3* 8.8* 8.7*  --   MG  --   --  2.3  --   --   --  2.1   GFR: Estimated Creatinine Clearance: 90.2 mL/min (by C-G formula based on SCr of 1.02 mg/dL). Liver Function Tests: Recent Labs  Lab 09/18/23 0400  AST 17  ALT 24  ALKPHOS 51  BILITOT 0.9  PROT 5.9*  ALBUMIN 3.0*   No results for input(s): LIPASE, AMYLASE in the last 168 hours. No results for input(s): AMMONIA in the last 168  hours. Coagulation Profile: No results for input(s): INR, PROTIME in the last 168 hours. Cardiac Enzymes: No results for input(s): CKTOTAL, CKMB, CKMBINDEX, TROPONINI in the last 168 hours. BNP (last 3 results) No results for input(s): PROBNP in the last 8760 hours. HbA1C: No results for input(s): HGBA1C in the last 72 hours. CBG: Recent Labs  Lab 09/20/23 1231 09/20/23 1640 09/20/23 2128 09/21/23 0925 09/21/23 1319  GLUCAP 171* 148* 203* 197* 125*   Lipid Profile: No results for input(s): CHOL, HDL, LDLCALC, TRIG, CHOLHDL, LDLDIRECT in the last 72 hours. Thyroid  Function Tests: No results for input(s): TSH, T4TOTAL, FREET4, T3FREE, THYROIDAB in the last 72 hours.  Anemia Panel: No results for input(s): VITAMINB12, FOLATE, FERRITIN, TIBC, IRON, RETICCTPCT in the last 72 hours. Most Recent Urinalysis On File:     Component Value Date/Time   COLORURINE YELLOW (A) 08/07/2016 1337   APPEARANCEUR CLEAR (A) 08/07/2016 1337   LABSPEC 1.020 08/07/2016 1337   PHURINE 5.0 08/07/2016 1337   GLUCOSEU NEGATIVE 08/07/2016 1337   HGBUR NEGATIVE 08/07/2016 1337   BILIRUBINUR NEGATIVE 08/07/2016 1337   KETONESUR NEGATIVE 08/07/2016 1337   PROTEINUR NEGATIVE 08/07/2016 1337   NITRITE NEGATIVE 08/07/2016 1337   LEUKOCYTESUR NEGATIVE 08/07/2016 1337   Sepsis Labs: @LABRCNTIP (procalcitonin:4,lacticidven:4) Microbiology: Recent Results (from the past 240 hours)  Resp panel by RT-PCR (RSV, Flu A&B, Covid) Anterior Nasal Swab     Status: None   Collection Time: 09/17/23  5:55 AM   Specimen: Anterior Nasal Swab  Result Value Ref Range Status   SARS Coronavirus 2 by RT PCR NEGATIVE NEGATIVE Final    Comment: (NOTE) SARS-CoV-2 target nucleic acids are NOT DETECTED.  The SARS-CoV-2 RNA is generally detectable in upper respiratory specimens during the acute phase of infection. The lowest concentration of SARS-CoV-2 viral copies this assay  can detect is 138 copies/mL. A negative result does not preclude SARS-Cov-2 infection and should not  be used as the sole basis for treatment or other patient management decisions. A negative result may occur with  improper specimen collection/handling, submission of specimen other than nasopharyngeal swab, presence of viral mutation(s) within the areas targeted by this assay, and inadequate number of viral copies(<138 copies/mL). A negative result must be combined with clinical observations, patient history, and epidemiological information. The expected result is Negative.  Fact Sheet for Patients:  bloggercourse.com  Fact Sheet for Healthcare Providers:  seriousbroker.it  This test is no t yet approved or cleared by the [place]  FDA and  has been authorized for detection and/or diagnosis of SARS-CoV-2 by FDA under an Emergency Use Authorization (EUA). This EUA will remain  in effect (meaning this test can be used) for the duration of the COVID-19 declaration under Section 564(b)(1) of the Act, 21 U.S.C.section 360bbb-3(b)(1), unless the authorization is terminated  or revoked sooner.       Influenza A by PCR NEGATIVE NEGATIVE Final   Influenza B by PCR NEGATIVE NEGATIVE Final    Comment: (NOTE) The Xpert Xpress SARS-CoV-2/FLU/RSV plus assay is intended as an aid in the diagnosis of influenza from Nasopharyngeal swab specimens and should not be used as a sole basis for treatment. Nasal washings and aspirates are unacceptable for Xpert Xpress SARS-CoV-2/FLU/RSV testing.  Fact Sheet for Patients: bloggercourse.com  Fact Sheet for Healthcare Providers: seriousbroker.it  This test is not yet approved or cleared by the [place]  FDA and has been authorized for detection and/or diagnosis of SARS-CoV-2 by FDA under an Emergency Use Authorization (EUA). This EUA will  remain in effect (meaning this test can be used) for the duration of the COVID-19 declaration under Section 564(b)(1) of the Act, 21 U.S.C. section 360bbb-3(b)(1), unless the authorization is terminated or revoked.     Resp Syncytial Virus by PCR NEGATIVE NEGATIVE Final    Comment: (NOTE) Fact Sheet for Patients: bloggercourse.com  Fact Sheet for Healthcare Providers: seriousbroker.it  This test is not yet approved or cleared by the [place]  FDA and has been authorized for detection and/or diagnosis of SARS-CoV-2 by FDA under an Emergency Use Authorization (EUA). This EUA will remain in effect (meaning this test can be used) for the duration of the COVID-19 declaration under Section 564(b)(1) of the Act, 21 U.S.C. section 360bbb-3(b)(1), unless the authorization is terminated or revoked.  Performed at [facility], [address] [place], [place] [address]   Respiratory (~20 pathogens) panel by PCR     Status: None   Collection Time: 09/19/23  6:50 PM   Specimen: Nasopharyngeal Swab; Respiratory  Result Value Ref Range Status   Adenovirus NOT DETECTED NOT DETECTED Final   Coronavirus 229E NOT DETECTED NOT DETECTED Final    Comment: (NOTE) The Coronavirus on the Respiratory Panel, DOES NOT test for the novel  Coronavirus (2019 nCoV)    Coronavirus HKU1 NOT DETECTED NOT DETECTED Final   Coronavirus NL63 NOT DETECTED NOT DETECTED Final   Coronavirus OC43 NOT DETECTED NOT DETECTED Final   Metapneumovirus NOT DETECTED NOT DETECTED Final   Rhinovirus / Enterovirus NOT DETECTED NOT DETECTED Final   Influenza A NOT DETECTED NOT DETECTED Final   Influenza B NOT DETECTED NOT DETECTED Final   Parainfluenza Virus 1 NOT DETECTED NOT DETECTED Final   Parainfluenza Virus 2 NOT DETECTED NOT DETECTED Final   Parainfluenza Virus 3 NOT DETECTED NOT DETECTED Final   Parainfluenza Virus 4 NOT DETECTED NOT DETECTED Final    Respiratory Syncytial Virus NOT DETECTED NOT  DETECTED Final   Bordetella pertussis NOT DETECTED NOT DETECTED Final   Bordetella Parapertussis NOT DETECTED NOT DETECTED Final   Chlamydophila pneumoniae NOT DETECTED NOT DETECTED Final   Mycoplasma pneumoniae NOT DETECTED NOT DETECTED Final    Comment: Performed at [facility], [address], [place], [place] [address]      Radiology Studies last 3 days: No results found.       Vinh Sachs, DO [facility] 09/21/2023, 4:24 PM    Dictation software may have been used to generate the above note. Typos may occur and escape review in typed/dictated notes. Please contact Dr Marsa directly for clarity if needed.  Staff may message me via secure chat in Epic  but this may not receive an immediate response,  please page me for urgent matters!  If 7PM-7AM, please contact night coverage [email or web address]

## 2023-09-22 ENCOUNTER — Encounter: Payer: Managed Care, Other (non HMO) | Admitting: Family

## 2023-09-22 DIAGNOSIS — I5023 Acute on chronic systolic (congestive) heart failure: Secondary | ICD-10-CM | POA: Diagnosis not present

## 2023-09-22 DIAGNOSIS — R0789 Other chest pain: Secondary | ICD-10-CM | POA: Diagnosis not present

## 2023-09-22 DIAGNOSIS — I4891 Unspecified atrial fibrillation: Secondary | ICD-10-CM | POA: Diagnosis not present

## 2023-09-22 LAB — CBC
HCT: 37.1 % — ABNORMAL LOW (ref 39.0–52.0)
Hemoglobin: 12.4 g/dL — ABNORMAL LOW (ref 13.0–17.0)
MCH: 28 pg (ref 26.0–34.0)
MCHC: 33.4 g/dL (ref 30.0–36.0)
MCV: 83.7 fL (ref 80.0–100.0)
Platelets: 234 10*3/uL (ref 150–400)
RBC: 4.43 MIL/uL (ref 4.22–5.81)
RDW: 13.1 % (ref 11.5–15.5)
WBC: 7.9 10*3/uL (ref 4.0–10.5)
nRBC: 0 % (ref 0.0–0.2)

## 2023-09-22 LAB — GLUCOSE, CAPILLARY
Glucose-Capillary: 258 mg/dL — ABNORMAL HIGH (ref 70–99)
Glucose-Capillary: 257 mg/dL — ABNORMAL HIGH (ref 70–99)
Glucose-Capillary: 89 mg/dL (ref 70–99)

## 2023-09-22 LAB — BASIC METABOLIC PANEL
Anion gap: 14 (ref 5–15)
Anion gap: 14 (ref 5–15)
BUN: 40 mg/dL — ABNORMAL HIGH (ref 8–23)
BUN: 40 mg/dL — ABNORMAL HIGH (ref 8–23)
CO2: 26 mmol/L (ref 22–32)
CO2: 29 mmol/L (ref 22–32)
Calcium: 8.3 mg/dL — ABNORMAL LOW (ref 8.9–10.3)
Calcium: 8.7 mg/dL — ABNORMAL LOW (ref 8.9–10.3)
Chloride: 96 mmol/L — ABNORMAL LOW (ref 98–111)
Chloride: 94 mmol/L — ABNORMAL LOW (ref 98–111)
Creatinine, Ser: 1.04 mg/dL (ref 0.61–1.24)
Creatinine, Ser: 1.02 mg/dL (ref 0.61–1.24)
GFR, Estimated: >60 mL/min (ref >60)
GFR, Estimated: >60 mL/min (ref >60)
Glucose, Bld: 271 mg/dL — ABNORMAL HIGH (ref 70–99)
Glucose, Bld: 240 mg/dL — ABNORMAL HIGH (ref 70–99)
Potassium: 2.9 mmol/L — ABNORMAL LOW (ref 3.5–5.1)
Potassium: 3.6 mmol/L (ref 3.5–5.1)
Sodium: 136 mmol/L (ref 135–145)
Sodium: 137 mmol/L (ref 135–145)

## 2023-09-22 LAB — TSH: TSH: <0.01 u[IU]/mL — ABNORMAL LOW (ref 0.350–4.500)

## 2023-09-22 LAB — T4, FREE: Free T4: 5.24 ng/dL — ABNORMAL HIGH (ref 0.61–1.12)

## 2023-09-22 MED ORDER — DIGOXIN 125 MCG PO TABS
0.1250 mg | ORAL_TABLET | Freq: Every day | ORAL | Status: DC
  Administered 2023-09-22 – 2023-09-25 (×4): 0.125 mg via ORAL
  Filled 2023-09-22 (×4): qty 1

## 2023-09-22 MED ORDER — POTASSIUM CHLORIDE CRYS ER 20 MEQ PO TBCR
40.0000 meq | EXTENDED_RELEASE_TABLET | ORAL | Status: AC
  Administered 2023-09-22 (×2): 40 meq via ORAL
  Filled 2023-09-22 (×2): qty 2

## 2023-09-22 MED ORDER — SODIUM CHLORIDE 0.9% FLUSH
3.0000 mL | Freq: Two times a day (BID) | INTRAVENOUS | Status: DC
  Administered 2023-09-23: 10 mL via INTRAVENOUS

## 2023-09-22 MED ORDER — SODIUM CHLORIDE 0.9% FLUSH: 3.0000 mL | INTRAVENOUS | Status: DC | PRN

## 2023-09-22 MED ORDER — MAGNESIUM SULFATE 2 GM/50ML IV SOLN
2.0000 g | Freq: Once | INTRAVENOUS | Status: AC
  Administered 2023-09-22: 2 g via INTRAVENOUS
  Filled 2023-09-22: qty 50

## 2023-09-22 MED ORDER — POTASSIUM CHLORIDE CRYS ER 20 MEQ PO TBCR
40.0000 meq | EXTENDED_RELEASE_TABLET | Freq: Once | ORAL | Status: AC
  Administered 2023-09-22: 40 meq via ORAL
  Filled 2023-09-22: qty 2

## 2023-09-22 MED ORDER — METOLAZONE 2.5 MG PO TABS
2.5000 mg | ORAL_TABLET | Freq: Once | ORAL | Status: AC
  Administered 2023-09-22: 2.5 mg via ORAL
  Filled 2023-09-22: qty 1

## 2023-09-22 MED ORDER — MENTHOL 3 MG MT LOZG
1.0000 | LOZENGE | OROMUCOSAL | Status: DC | PRN
  Filled 2023-09-22: qty 9

## 2023-09-22 NOTE — Progress Notes (Addendum)
 Patient ID: William [name]., male   DOB: December 05, 1955, 68 y.o.   MRN: [id number]     Advanced Heart Failure Rounding Note  Cardiologist: Evalene Lunger, MD  Chief Complaint: Shortness of breath Subjective:    Still in atrial fibrillation with RVR.    Feels ok, tired as he's unable to sleep with load patient behind him overnight.  Objective:   Weight Range: 105.3 kg Body mass index is 32.38 kg/m.   Vital Signs:   Temp:  [98.1 F (36.7 C)-98.5 F (36.9 C)] 98.3 F (36.8 C) (01/13 0400) Pulse Rate:  [127-139] 139 (01/13 0400) Resp:  [18-21] 21 (01/13 0404) BP: (81-117)/(60-95) 93/70 (01/13 0404) SpO2:  [94 %-99 %] 99 % (01/13 0400) Weight:  [105.3 kg] 105.3 kg (01/13 0400) Last BM Date : 09/21/23  Weight change: Filed Weights   09/21/23 0528 09/21/23 0548 09/22/23 0400  Weight: 110.7 kg 110.7 kg 105.3 kg    Intake/Output:   Intake/Output Summary (Last 24 hours) at 09/22/2023 9167 Last data filed at 09/22/2023 0400 Gross per 24 hour  Intake 360 ml  Output --  Net 360 ml      Physical Exam    General:  well appearing, sitting up in chair.  No respiratory difficulty HEENT: normal Neck: supple. JVD ~7 cm. Carotids 2+ bilat; no bruits. No lymphadenopathy or thyromegaly appreciated. Cor: PMI nondisplaced. Irregular rate & rhythm. No rubs, gallops or murmurs. Lungs: clear, diminished bases R>L Abdomen: soft, nontender, nondistended. No hepatosplenomegaly. No bruits or masses. Good bowel sounds. Extremities: no cyanosis, clubbing, rash, +2 BLE edema  Neuro: alert & oriented x 3, cranial nerves grossly intact. moves all 4 extremities w/o difficulty. Affect pleasant.    Telemetry   Atrial fibrillation rate 120s-130s (personally reviewed)  Labs    CBC Recent Labs    09/21/23 0503 09/22/23 0522  WBC 9.8 7.9  HGB 13.9 12.4*  HCT 41.6 37.1*  MCV 83.9 83.7  PLT 288 234   Basic Metabolic Panel Recent Labs    98/87/74 0503 09/21/23 0954 09/22/23 0522  NA 133*   --  136  K 3.0*  --  2.9*  CL 94*  --  96*  CO2 24  --  26  GLUCOSE 168*  --  271*  BUN 39*  --  40*  CREATININE 1.02  --  1.04  CALCIUM 8.7*  --  8.3*  MG  --  2.1  --    Liver Function Tests No results for input(s): AST, ALT, ALKPHOS, BILITOT, PROT, ALBUMIN in the last 72 hours.  No results for input(s): LIPASE, AMYLASE in the last 72 hours. Cardiac Enzymes No results for input(s): CKTOTAL, CKMB, CKMBINDEX, TROPONINI in the last 72 hours.  BNP: BNP (last 3 results) Recent Labs    08/19/23 2232 09/17/23 0444  BNP 1,995.5* 2,499.1*    ProBNP (last 3 results) No results for input(s): PROBNP in the last 8760 hours.   D-Dimer No results for input(s): DDIMER in the last 72 hours. Hemoglobin A1C No results for input(s): HGBA1C in the last 72 hours. Fasting Lipid Panel No results for input(s): CHOL, HDL, LDLCALC, TRIG, CHOLHDL, LDLDIRECT in the last 72 hours. Thyroid  Function Tests No results for input(s): TSH, T4TOTAL, T3FREE, THYROIDAB in the last 72 hours.  Invalid input(s): FREET3   Other results:   Imaging    No results found.   Medications:     Scheduled Medications:  apixaban   5 mg Oral BID   benzonatate   200  mg Oral TID   digoxin   0.25 mg Oral Daily   empagliflozin   25 mg Oral Daily   FLUoxetine   40 mg Oral Daily   furosemide   80 mg Intravenous BID   insulin  aspart  0-15 Units Subcutaneous TID WC   methimazole   15 mg Oral TID   metoprolol  succinate  25 mg Oral BID   pantoprazole   40 mg Oral QPM   pravastatin   40 mg Oral QHS    Infusions:   PRN Medications: guaiFENesin -dextromethorphan , ipratropium, levalbuterol , ondansetron  **OR** ondansetron  (ZOFRAN ) IV, zolpidem    Assessment/Plan  1. 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  He was taking a lower dose of methimazole  than prescribed and also did not get prednisone  at home.  He had  prior episode of AF in 2021, had been on amiodarone  since that time.  Amiodarone  was stopped in 12/24 with type 2 AIT.  Today, HR in 120s-130s.  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  Needs to get back into NSR though antiarrhythmic options are limited.  Would avoid amiodarone  use unless absolutely necessary, and with persistently long QTc, I do not think that he will be able to take Tikosyn.  - Continue digoxin  for rate control.  - Continue Toprol  XL 25 mg daily, would not increase dose with soft BP.  - Continue Eliquis  5 mg bid.  - Continue hyperthyroidism treatment with methimazole  and prednisone .   - Patient had Mounjaro on 1/7. Unable to be sedate for TEE-guided DCCV until 1/14 safely. Plan for TEE/DCCV tomorrow. - Ultimately, he should be evaluated for AF ablation.  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 Thyroid  stimulating antibodies were negative in 12/24, less likely type 1. He was taking lower than prescribed dose of methimazole  at home and never got prednisone  after last discharge.  - Methimazole  15 mg tid.  - Continue prednisone  40 mg daily.  Slow taper over 1-3 months depending on thyroid  labs. When he goes home, will need PPI and Bactrim prophylaxis.  - Needs to see an endocrinologist.  3. Acute on chronic systolic CHF: Presumed nonischemic cardiomyopathy, ?tachycardia-mediated with AF/RVR.  Echo in 3/21 with EF < 20%.  He was in atrial fibrillation with RVR at that time, converted to NSR on amiodarone .  Slow improvement in EF over time, echo in 4/24 with EF 40-45%, moderate aortic stenosis.  Recurrent AF/RVR in 12/24, echo with EF 20-25% with normal RV, mild-moderate MR, moderate AS with mean gradient 16 mmHg. Suspect fall in LV systolic function was tachycardia-mediated. He remains in AF/RVR, he is volume overloaded on exam today.  SBP 80s-90s, no room to titrate GDMT. Reports good diuresis/UOP but no I/Os done. Weight overall  down 18lbs. - Lasix  80 mg IV bid, replace K. Follow I/Os.  - Will add metolazone  2.5 later today after K has been repleted. Will repeat BMET around noon.  - Decrease digoxin  0.25>0.125. Dig level 0.7 09/21/23 - Continue Toprol  XL 25 daily.  - Continue Jardiance .  - Place UNNA boots - Needs conversion back to NSR.  - If EF does not improve significantly in NSR, consider right/left heart cath in future.  4. Aortic stenosis: Moderate on echo this admission.  5. DM2: Watch glucose with steroid use.   Length of Stay: 5  Beckey LITTIE Coe, NP  09/22/2023, 8:32 AM  Advanced Heart Failure Team Pager [phone] (M-F; 7a - 5p)  Please contact [facility] for night-coverage after hours (5p -7a ) and weekends on [email or web address]

## 2023-09-22 NOTE — Consult Note (Signed)
 WOC Nurse Consult Note: Reason for Consult: apply Unna's boots bilaterally No wounds Applied Unna's boots; patient tolerated well Explained rationale for Unna's boots to lessen edema at which point he can be fit for long term compression stockings to manage edema on an outpatient basis.  Explained that Unna's boots can not get wet as well, would recommend bed bath until patient can convert to long term compression stockings outpatient. Patient is concerned about this, notified ordering care team and [facility] staff for needs for outpatient follow up.   William [name] [facility], CNS, [employer] [phone]

## 2023-09-22 NOTE — TOC Progression Note (Signed)
 Transition of Care (TOC) - Progression Note    Patient Details  Name: William [name]. MRN: [id number] Date of Birth: 09/04/1956  Transition of Care [facility]) CM/SW Contact  Tomasa JAYSON Childes, RN Phone Number: 09/22/2023, 2:27 PM  Clinical Narrative:    TOC continuing to follow patient's progress throughout discharge planning.        Expected Discharge Plan and Services                                               Social Determinants of Health (SDOH) Interventions SDOH Screenings   Food Insecurity: No Food Insecurity (09/18/2023)  Housing: Low Risk  (09/18/2023)  Transportation Needs: No Transportation Needs (09/18/2023)  Utilities: Not At Risk (09/18/2023)  Depression (PHQ2-9): Low Risk  (11/23/2021)  Financial Resource Strain: Low Risk  (05/16/2023)   Received from [facility]  Social Connections: Moderately Integrated (09/18/2023)  Stress: Stress Concern Present (12/03/2019)   Received from [facility]  Tobacco Use: Low Risk  (09/17/2023)    Readmission Risk Interventions     No data to display

## 2023-09-22 NOTE — Progress Notes (Signed)
 Called to bedside for BP: 79/45.   On arrival patient walking around the room, denies dizziness.   Personally checked BP: got 101/78 (87). SBP usually runs in 80s-90s. Ok to give all meds. Needs to continue with diuresis.   Beckey Coe, AGACNP-BC  Advanced Heart Failure Team

## 2023-09-22 NOTE — Progress Notes (Signed)
 PROGRESS NOTE    William [name].   FMW:[id number] DOB: 09/27/1955  DOA: 09/17/2023 Date of Service: 09/22/23 which is hospital day 5  PCP: Fernande Ophelia JINNY DOUGLAS, MD    Hospital course / significant events:   HPI:  William Senteno. is a 68 y.o. male with medical history significant of HFrEF, atrial fibrillation, type 2 diabetes, aortic stenosis, history of gastric bypass, amiodarone  induced hyperthyroidism presenting with cough, chest pain. Had been admitted 08/2023 for decompensated heart failure, A-fib with RVR, amiodarone  induced hypothyroidism. Has been on methimazole , prednisone  taper, Eliquis , metoprolol , Lasix . Patient states he was discharged with a cough that progressively improved but then worsened acutely over the past 1 to 2 weeks. Has had mild nonspecific chest pain over the past few days.    01/08: admitted for chest pain r/o ACS, exacerbation HFrEF, Afib w/ RVR - given soft BP have held rate control and diuresing w/ caution. Cardiology consulted - starting IV digoxin  and transition to po tomorrow, cautious lasix , GDMT limited by hypotension. Of note, taking methimazole  5 mg tid not 15 mg tid as rx.  01/09: HR still elevated, cardio recs for DCCV planning this for tomorrow  01/10: delay in DCCV - cannot safely sedate given recent mounjaro per anesthesia - at this point given high risk for general anesthesia planis to wait the standard 7 days from last dose which puts him at 09/23/23 unless he decompensates in the meantime.  01/11-01/12: continuing diuresis, Planning for TEE/DCCV on 1/14 after Mounjaro has been held for 1 week. 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  Blood pressure has been borderline low, limiting rate control  Cardiology following  Continue digoxin  for rate control.  Continue Toprol  XL 25 mg daily, would not increase dose with soft BP.  Continue Eliquis  5 mg bid.  Continue hyperthyroidism treatment with methimazole  and prednisone  DCCV delayed d/t concerns w/ anesthesia / recent Mounjaro use, would need to wait 7 days from last dose - cardiology and anesthesiology planning DCCV tomorrow 01/14, if he deompensates may need to have conversion urgently under general anesthesia  Ultimately, plan eval for AF ablation.   Intermitted Vtach Asymptomatic Cardiology team aware Continue telemetry Supplement K and Mg   Acute chest wall pain Nonspecific likely multifactorial in setting of HF, Afib No ACS per cardiology monitor   Acute on chronic HFrEF (heart failure with reduced ejection fraction) (HCC) 2D echo December 2024 with a EF of 20 to 25% Cardiology following Continue Lasix  per cardiology Continue beta-blocker, Jardiance , and digoxin  Low blood pressure limiting GDMT Needs repeat echo in 3 months to assess for recovery of LVEF and ICD candidacy.     Hyperthyroidism admission December 2024 for amiodarone  induced hypothyroidism On methimazole  and recent course of prednisone  TSH <0.10  Free T4 4.66  continue methimazole  S/p stress dose of hydrocortisone  in the ER out of concern for hypotension and ? Adrenal insufficiency    Cough, question URI vs amiodarone  tox  Noted recurrent cough over multiple weeks with recent evaluation for issues including A-fib with RVR, acute on chronic HFrEF, amiodarone  induced hyperthyroidism CT of  the chest on admission grossly stable apart from pleural effusions Add as needed DuoNebs PPI Consider pulmonary follow-up if persists given prior amiodarone  toxicity with potential for pulmonary impact   GERD (gastroesophageal reflux disease) PPI   Type 2 diabetes  mellitus without complication (HCC) Blood sugars 170s SSI  Monitor  Hypokalemia Replace as needed Monitor BMP   Class 1 obesity based on BMI: Body mass index is 32.38 kg/m.  Underweight - under 18  overweight - 25 to 29 obese - 30 or more Class 1 obesity: BMI of 30.0 to 34 Class 2 obesity: BMI of 35.0 to 39 Class 3 obesity: BMI of 40.0 to 49 Super Morbid Obesity: BMI 50-59 Super-super Morbid Obesity: BMI 60+ Significantly low or high BMI is associated with higher medical risk.  Weight management advised as adjunct to other disease management and risk reduction treatments    DVT prophylaxis: eliquis  IV fluids: no continuous IV fluids  Nutrition: cardaic diet Central lines / invasive devices: noen  Code Status: FULL CODE ACP documentation reviewed:  none on file in [facility]  TOC needs: TBD but expect d/c back home with no home health needs  Barriers to dispo / significant pending items: DCCV per cardiology and anesthesia recs as above             Subjective / Brief ROS:  Patient reports no concerns at this time  Walking halls this morning and feeling well no chest pain   Family Communication:none at this time     Objective Findings:  Vitals:   09/22/23 0955 09/22/23 1224 09/22/23 1610 09/22/23 1643  BP: 101/78 (!) 83/70 91/63   Pulse:  (!) 125 72   Resp:  18    Temp:  98.1 F (36.7 C)  97.9 F (36.6 C)  TempSrc:    Oral  SpO2:  92% 97%   Weight:      Height:        Intake/Output Summary (Last 24 hours) at 09/22/2023 1735 Last data filed at 09/22/2023 0400 Gross per 24 hour  Intake 360 ml  Output --  Net 360 ml     Filed Weights   09/21/23 0528 09/21/23 0548 09/22/23 0400  Weight: 110.7 kg 110.7 kg 105.3 kg    Examination:  Physical Exam Constitutional:      General: He is not in acute distress. Cardiovascular:     Rate and Rhythm: Tachycardia present. Rhythm irregular.  Pulmonary:     Breath sounds: Normal breath sounds.   Musculoskeletal:     Right lower leg: No edema.     Left lower leg: No edema.  Neurological:     General: No focal deficit present.     Mental Status: He is alert and oriented to person, place, and time.  Psychiatric:        Mood and Affect: Mood normal.        Behavior: Behavior normal.          Scheduled Medications:   apixaban   5 mg Oral BID   benzonatate   200 mg Oral TID   digoxin   0.125 mg Oral Daily   empagliflozin   25 mg Oral Daily   FLUoxetine   40 mg Oral Daily   furosemide   80 mg Intravenous BID   insulin  aspart  0-15 Units Subcutaneous TID WC   methimazole   15 mg Oral TID   metoprolol  succinate  25 mg Oral BID   pantoprazole   40 mg Oral QPM   pravastatin   40 mg Oral  QHS    Continuous Infusions:  magnesium  sulfate bolus IVPB       PRN Medications:  guaiFENesin -dextromethorphan , ipratropium, levalbuterol , menthol -cetylpyridinium, ondansetron  **OR** ondansetron  (ZOFRAN ) IV, zolpidem   Antimicrobials from admission:  Anti-infectives (From admission, onward)    None           Data Reviewed:  I have personally reviewed the following...  CBC: Recent Labs  Lab 09/18/23 0400 09/19/23 0303 09/20/23 0603 09/21/23 0503 09/22/23 0522  WBC 9.9 9.7 10.7* 9.8 7.9  HGB 11.5* 11.7* 13.0 13.9 12.4*  HCT 35.5* 36.8* 39.2 41.6 37.1*  MCV 87.2 88.7 84.8 83.9 83.7  PLT 163 176 251 288 234   Basic Metabolic Panel: Recent Labs  Lab 09/18/23 0406 09/19/23 0303 09/20/23 0603 09/21/23 0503 09/21/23 0954 09/22/23 0522 09/22/23 1313  NA  --  140 135 133*  --  136 137  K  --  4.4 3.4* 3.0*  --  2.9* 3.6  CL  --  104 96* 94*  --  96* 94*  CO2  --  26 26 24   --  26 29  GLUCOSE  --  136* 151* 168*  --  271* 240*  BUN  --  31* 34* 39*  --  40* 40*  CREATININE  --  1.03 0.99 1.02  --  1.04 1.02  CALCIUM  --  8.3* 8.8* 8.7*  --  8.3* 8.7*  MG 2.3  --   --   --  2.1  --   --    GFR: Estimated Creatinine Clearance: 88 mL/min (by C-G formula based on SCr of  1.02 mg/dL). Liver Function Tests: Recent Labs  Lab 09/18/23 0400  AST 17  ALT 24  ALKPHOS 51  BILITOT 0.9  PROT 5.9*  ALBUMIN 3.0*   No results for input(s): LIPASE, AMYLASE in the last 168 hours. No results for input(s): AMMONIA in the last 168 hours. Coagulation Profile: No results for input(s): INR, PROTIME in the last 168 hours. Cardiac Enzymes: No results for input(s): CKTOTAL, CKMB, CKMBINDEX, TROPONINI in the last 168 hours. BNP (last 3 results) No results for input(s): PROBNP in the last 8760 hours. HbA1C: No results for input(s): HGBA1C in the last 72 hours. CBG: Recent Labs  Lab 09/21/23 1705 09/21/23 2216 09/22/23 0941 09/22/23 1215 09/22/23 1639  GLUCAP 157* 162* 258* 257* 89   Lipid Profile: No results for input(s): CHOL, HDL, LDLCALC, TRIG, CHOLHDL, LDLDIRECT in the last 72 hours. Thyroid  Function Tests: Recent Labs    09/22/23 0916  TSH <0.010*  FREET4 5.24*    Anemia Panel: No results for input(s): VITAMINB12, FOLATE, FERRITIN, TIBC, IRON, RETICCTPCT in the last 72 hours. Most Recent Urinalysis On File:     Component Value Date/Time   COLORURINE YELLOW (A) 08/07/2016 1337   APPEARANCEUR CLEAR (A) 08/07/2016 1337   LABSPEC 1.020 08/07/2016 1337   PHURINE 5.0 08/07/2016 1337   GLUCOSEU NEGATIVE 08/07/2016 1337   HGBUR NEGATIVE 08/07/2016 1337   BILIRUBINUR NEGATIVE 08/07/2016 1337   KETONESUR NEGATIVE 08/07/2016 1337   PROTEINUR NEGATIVE 08/07/2016 1337   NITRITE NEGATIVE 08/07/2016 1337   LEUKOCYTESUR NEGATIVE 08/07/2016 1337   Sepsis Labs: @LABRCNTIP (procalcitonin:4,lacticidven:4) Microbiology: Recent Results (from the past 240 hours)  Resp panel by RT-PCR (RSV, Flu A&B, Covid) Anterior Nasal Swab     Status: None   Collection Time: 09/17/23  5:55 AM   Specimen: Anterior Nasal Swab  Result Value Ref Range Status   SARS Coronavirus 2 by RT PCR NEGATIVE NEGATIVE Final  Comment:  (NOTE) SARS-CoV-2 target nucleic acids are NOT DETECTED.  The SARS-CoV-2 RNA is generally detectable in upper respiratory specimens during the acute phase of infection. The lowest concentration of SARS-CoV-2 viral copies this assay can detect is 138 copies/mL. A negative result does not preclude SARS-Cov-2 infection and should not be used as the sole basis for treatment or other patient management decisions. A negative result may occur with  improper specimen collection/handling, submission of specimen other than nasopharyngeal swab, presence of viral mutation(s) within the areas targeted by this assay, and inadequate number of viral copies(<138 copies/mL). A negative result must be combined with clinical observations, patient history, and epidemiological information. The expected result is Negative.  Fact Sheet for Patients:  bloggercourse.com  Fact Sheet for Healthcare Providers:  seriousbroker.it  This test is no t yet approved or cleared by the [place]  FDA and  has been authorized for detection and/or diagnosis of SARS-CoV-2 by FDA under an Emergency Use Authorization (EUA). This EUA will remain  in effect (meaning this test can be used) for the duration of the COVID-19 declaration under Section 564(b)(1) of the Act, 21 U.S.C.section 360bbb-3(b)(1), unless the authorization is terminated  or revoked sooner.       Influenza A by PCR NEGATIVE NEGATIVE Final   Influenza B by PCR NEGATIVE NEGATIVE Final    Comment: (NOTE) The Xpert Xpress SARS-CoV-2/FLU/RSV plus assay is intended as an aid in the diagnosis of influenza from Nasopharyngeal swab specimens and should not be used as a sole basis for treatment. Nasal washings and aspirates are unacceptable for Xpert Xpress SARS-CoV-2/FLU/RSV testing.  Fact Sheet for Patients: bloggercourse.com  Fact Sheet for Healthcare  Providers: seriousbroker.it  This test is not yet approved or cleared by the [place]  FDA and has been authorized for detection and/or diagnosis of SARS-CoV-2 by FDA under an Emergency Use Authorization (EUA). This EUA will remain in effect (meaning this test can be used) for the duration of the COVID-19 declaration under Section 564(b)(1) of the Act, 21 U.S.C. section 360bbb-3(b)(1), unless the authorization is terminated or revoked.     Resp Syncytial Virus by PCR NEGATIVE NEGATIVE Final    Comment: (NOTE) Fact Sheet for Patients: bloggercourse.com  Fact Sheet for Healthcare Providers: seriousbroker.it  This test is not yet approved or cleared by the [place]  FDA and has been authorized for detection and/or diagnosis of SARS-CoV-2 by FDA under an Emergency Use Authorization (EUA). This EUA will remain in effect (meaning this test can be used) for the duration of the COVID-19 declaration under Section 564(b)(1) of the Act, 21 U.S.C. section 360bbb-3(b)(1), unless the authorization is terminated or revoked.  Performed at [facility], [address] [place], [place] [address]   Respiratory (~20 pathogens) panel by PCR     Status: None   Collection Time: 09/19/23  6:50 PM   Specimen: Nasopharyngeal Swab; Respiratory  Result Value Ref Range Status   Adenovirus NOT DETECTED NOT DETECTED Final   Coronavirus 229E NOT DETECTED NOT DETECTED Final    Comment: (NOTE) The Coronavirus on the Respiratory Panel, DOES NOT test for the novel  Coronavirus (2019 nCoV)    Coronavirus HKU1 NOT DETECTED NOT DETECTED Final   Coronavirus NL63 NOT DETECTED NOT DETECTED Final   Coronavirus OC43 NOT DETECTED NOT DETECTED Final   Metapneumovirus NOT DETECTED NOT DETECTED Final   Rhinovirus / Enterovirus NOT DETECTED NOT DETECTED Final   Influenza A NOT DETECTED NOT DETECTED Final   Influenza B NOT  DETECTED NOT DETECTED Final   Parainfluenza Virus 1 NOT DETECTED NOT DETECTED Final   Parainfluenza Virus 2 NOT DETECTED NOT DETECTED Final   Parainfluenza Virus 3 NOT DETECTED NOT DETECTED Final   Parainfluenza Virus 4 NOT DETECTED NOT DETECTED Final   Respiratory Syncytial Virus NOT DETECTED NOT DETECTED Final   Bordetella pertussis NOT DETECTED NOT DETECTED Final   Bordetella Parapertussis NOT DETECTED NOT DETECTED Final   Chlamydophila pneumoniae NOT DETECTED NOT DETECTED Final   Mycoplasma pneumoniae NOT DETECTED NOT DETECTED Final    Comment: Performed at [facility], [address], [place], [place] [address]      Radiology Studies last 3 days: No results found.       Naveen Lorusso, DO [facility] 09/22/2023, 5:35 PM    Dictation software may have been used to generate the above note. Typos may occur and escape review in typed/dictated notes. Please contact Dr Marsa directly for clarity if needed.  Staff may message me via secure chat in Epic  but this may not receive an immediate response,  please page me for urgent matters!  If 7PM-7AM, please contact night coverage [email or web address]

## 2023-09-23 ENCOUNTER — Inpatient Hospital Stay
Admit: 2023-09-23 | Discharge: 2023-09-23 | Disposition: A | Discharge status: Home / Self Care | Payer: Managed Care, Other (non HMO) | Attending: Cardiology | Admitting: Cardiology

## 2023-09-23 ENCOUNTER — Other Ambulatory Visit: Payer: Self-pay

## 2023-09-23 ENCOUNTER — Encounter
Admission: EM | Disposition: A | Discharge status: Other Acute Inpatient Hospital | Payer: Self-pay | Source: Home / Self Care | Attending: Osteopathic Medicine

## 2023-09-23 ENCOUNTER — Inpatient Hospital Stay: Payer: Managed Care, Other (non HMO) | Admitting: Anesthesiology

## 2023-09-23 ENCOUNTER — Encounter: Payer: Self-pay | Admitting: Family Medicine

## 2023-09-23 DIAGNOSIS — I4891 Unspecified atrial fibrillation: Secondary | ICD-10-CM | POA: Diagnosis not present

## 2023-09-23 DIAGNOSIS — R0789 Other chest pain: Secondary | ICD-10-CM | POA: Diagnosis not present

## 2023-09-23 DIAGNOSIS — I351 Nonrheumatic aortic (valve) insufficiency: Secondary | ICD-10-CM | POA: Diagnosis not present

## 2023-09-23 DIAGNOSIS — I340 Nonrheumatic mitral (valve) insufficiency: Secondary | ICD-10-CM | POA: Diagnosis not present

## 2023-09-23 HISTORY — PX: TEE WITHOUT CARDIOVERSION: SHX5443

## 2023-09-23 LAB — T3, FREE: T3, Free: 11.7 pg/mL — ABNORMAL HIGH (ref 2.0–4.4)

## 2023-09-23 LAB — CBC
HCT: 37.4 % — ABNORMAL LOW (ref 39.0–52.0)
Hemoglobin: 12.4 g/dL — ABNORMAL LOW (ref 13.0–17.0)
MCH: 28.1 pg (ref 26.0–34.0)
MCHC: 33.2 g/dL (ref 30.0–36.0)
MCV: 84.8 fL (ref 80.0–100.0)
Platelets: 257 10*3/uL (ref 150–400)
RBC: 4.41 MIL/uL (ref 4.22–5.81)
RDW: 13 % (ref 11.5–15.5)
WBC: 9.2 10*3/uL (ref 4.0–10.5)
nRBC: 0 % (ref 0.0–0.2)

## 2023-09-23 LAB — BASIC METABOLIC PANEL
Anion gap: 11 (ref 5–15)
BUN: 39 mg/dL — ABNORMAL HIGH (ref 8–23)
CO2: 31 mmol/L (ref 22–32)
Calcium: 8.5 mg/dL — ABNORMAL LOW (ref 8.9–10.3)
Chloride: 94 mmol/L — ABNORMAL LOW (ref 98–111)
Creatinine, Ser: 0.93 mg/dL (ref 0.61–1.24)
GFR, Estimated: >60 mL/min (ref >60)
Glucose, Bld: 145 mg/dL — ABNORMAL HIGH (ref 70–99)
Potassium: 3.1 mmol/L — ABNORMAL LOW (ref 3.5–5.1)
Sodium: 136 mmol/L (ref 135–145)

## 2023-09-23 LAB — GLUCOSE, CAPILLARY
Glucose-Capillary: 150 mg/dL — ABNORMAL HIGH (ref 70–99)
Glucose-Capillary: 156 mg/dL — ABNORMAL HIGH (ref 70–99)
Glucose-Capillary: 345 mg/dL — ABNORMAL HIGH (ref 70–99)
Glucose-Capillary: 115 mg/dL — ABNORMAL HIGH (ref 70–99)

## 2023-09-23 LAB — MAGNESIUM: Magnesium: 2.5 mg/dL — ABNORMAL HIGH (ref 1.7–2.4)

## 2023-09-23 SURGERY — ECHOCARDIOGRAM, TRANSESOPHAGEAL
Anesthesia: General

## 2023-09-23 MED ORDER — FENTANYL CITRATE (PF) 100 MCG/2ML IJ SOLN
INTRAMUSCULAR | Status: AC
  Filled 2023-09-23: qty 2

## 2023-09-23 MED ORDER — EPHEDRINE 5 MG/ML INJ
INTRAVENOUS | Status: AC
  Filled 2023-09-23: qty 5

## 2023-09-23 MED ORDER — POTASSIUM CHLORIDE 20 MEQ PO PACK
40.0000 meq | PACK | Freq: Two times a day (BID) | ORAL | Status: DC
  Administered 2023-09-23: 40 meq via ORAL
  Filled 2023-09-23 (×3): qty 2

## 2023-09-23 MED ORDER — LIDOCAINE HCL (CARDIAC) PF 100 MG/5ML IV SOSY
PREFILLED_SYRINGE | INTRAVENOUS | Status: DC | PRN
  Administered 2023-09-23: 40 mg via INTRAVENOUS

## 2023-09-23 MED ORDER — PROPOFOL 10 MG/ML IV BOLUS
INTRAVENOUS | Status: AC
  Filled 2023-09-23: qty 20

## 2023-09-23 MED ORDER — DEXMEDETOMIDINE HCL IN NACL 80 MCG/20ML IV SOLN
INTRAVENOUS | Status: DC | PRN
  Administered 2023-09-23: 8 ug via INTRAVENOUS

## 2023-09-23 MED ORDER — BUTAMBEN-TETRACAINE-BENZOCAINE 2-2-14 % EX AERO
INHALATION_SPRAY | CUTANEOUS | Status: AC
  Filled 2023-09-23: qty 5

## 2023-09-23 MED ORDER — POTASSIUM CHLORIDE CRYS ER 20 MEQ PO TBCR
40.0000 meq | EXTENDED_RELEASE_TABLET | Freq: Once | ORAL | Status: AC
  Administered 2023-09-23: 40 meq via ORAL
  Filled 2023-09-23: qty 2

## 2023-09-23 MED ORDER — POTASSIUM CHLORIDE CRYS ER 20 MEQ PO TBCR
40.0000 meq | EXTENDED_RELEASE_TABLET | ORAL | Status: DC
  Administered 2023-09-23 (×2): 40 meq via ORAL
  Filled 2023-09-23 (×2): qty 2

## 2023-09-23 MED ORDER — DEXMEDETOMIDINE HCL IN NACL 80 MCG/20ML IV SOLN
INTRAVENOUS | Status: AC
Start: 2023-09-23 — End: ?
  Filled 2023-09-23: qty 20

## 2023-09-23 MED ORDER — PROPOFOL 500 MG/50ML IV EMUL
INTRAVENOUS | Status: DC | PRN
  Administered 2023-09-23 (×2): 70 mg via INTRAVENOUS

## 2023-09-23 MED ORDER — SODIUM CHLORIDE 0.9 % IV SOLN: INTRAVENOUS | Status: DC

## 2023-09-23 MED ORDER — LIDOCAINE HCL (PF) 2 % IJ SOLN
INTRAMUSCULAR | Status: AC
  Filled 2023-09-23: qty 5

## 2023-09-23 MED ORDER — PHENYLEPHRINE 80 MCG/ML (10ML) SYRINGE FOR IV PUSH (FOR BLOOD PRESSURE SUPPORT)
PREFILLED_SYRINGE | INTRAVENOUS | Status: AC
  Filled 2023-09-23: qty 10

## 2023-09-23 MED ORDER — LIDOCAINE VISCOUS HCL 2 % MT SOLN
OROMUCOSAL | Status: AC
  Filled 2023-09-23: qty 15

## 2023-09-23 MED ORDER — PROPOFOL 10 MG/ML IV BOLUS
INTRAVENOUS | Status: AC
  Filled 2023-09-23: qty 40

## 2023-09-23 MED ORDER — METOLAZONE 2.5 MG PO TABS
2.5000 mg | ORAL_TABLET | Freq: Once | ORAL | Status: AC
  Administered 2023-09-23: 2.5 mg via ORAL
  Filled 2023-09-23: qty 1

## 2023-09-23 MED ORDER — LIDOCAINE-PRILOCAINE 2.5-2.5 % EX CREA
TOPICAL_CREAM | Freq: Two times a day (BID) | CUTANEOUS | Status: DC | PRN
  Filled 2023-09-23: qty 5

## 2023-09-23 MED ORDER — LIDOCAINE HCL URETHRAL/MUCOSAL 2 % EX GEL
CUTANEOUS | Status: AC
  Filled 2023-09-23: qty 10

## 2023-09-23 MED ORDER — PHENYLEPHRINE 80 MCG/ML (10ML) SYRINGE FOR IV PUSH (FOR BLOOD PRESSURE SUPPORT)
PREFILLED_SYRINGE | INTRAVENOUS | Status: DC | PRN
  Administered 2023-09-23: 160 ug via INTRAVENOUS

## 2023-09-23 MED ORDER — METOPROLOL TARTRATE 25 MG PO TABS
25.0000 mg | ORAL_TABLET | Freq: Three times a day (TID) | ORAL | Status: DC
  Administered 2023-09-23 (×2): 25 mg via ORAL
  Filled 2023-09-23 (×3): qty 1

## 2023-09-23 MED ORDER — FENTANYL CITRATE (PF) 100 MCG/2ML IJ SOLN
INTRAMUSCULAR | Status: DC | PRN
  Administered 2023-09-23: 50 ug via INTRAVENOUS

## 2023-09-23 NOTE — Progress Notes (Signed)
 PROGRESS NOTE    William [name].   FMW:[id number] DOB: 1956/02/26  DOA: 09/17/2023 Date of Service: 09/23/23 which is hospital day 6  PCP: Fernande Ophelia JINNY DOUGLAS, MD    Hospital course / significant events:   HPI:  William Pineda. is a 68 y.o. male with medical history significant of HFrEF, atrial fibrillation, type 2 diabetes, aortic stenosis, history of gastric bypass, amiodarone  induced hyperthyroidism presenting with cough, chest pain. Had been admitted 08/2023 for decompensated heart failure, A-fib with RVR, amiodarone  induced hypothyroidism. Has been on methimazole , prednisone  taper, Eliquis , metoprolol , Lasix . Patient states he was discharged with a cough that progressively improved but then worsened acutely over the past 1 to 2 weeks. Has had mild nonspecific chest pain over the past few days.    01/08: admitted for chest pain r/o ACS, exacerbation HFrEF, Afib w/ RVR - given soft BP have held rate control and diuresing w/ caution. Cardiology consulted - starting IV digoxin  and transition to po tomorrow, cautious lasix , GDMT limited by hypotension. Of note, taking methimazole  5 mg tid not 15 mg tid as rx.  01/09: HR still elevated, cardio recs for DCCV planning this for tomorrow  01/10: delay in DCCV - cannot safely sedate given recent mounjaro per anesthesia - at this point given high risk for general anesthesia planis to wait the standard 7 days from last dose which puts him at 09/23/23 unless he decompensates in the meantime.  01/11-01/12: continuing diuresis, Planning for TEE/DCCV on 1/14 after Mounjaro has been held for 1 week. BP remains low  01/13: runs of limited VT, cardiology aware, pt has no symptoms 01/14: DCCV unsuccessful, planning reattempt       Consultants:  Cardiology [facility])  Procedures/Surgeries: none      ASSESSMENT & PLAN:   Atrial fibrillation with RVR History of PAF, 2021  suspected type 2 amiodarone  induced thyrotoxicity dx 08/2023, then was taking a  lower dose of methimazole  than prescribed and also did not get prednisone  at home.  Blood pressure has been borderline low, limiting rate control  Cardiology following  Continue digoxin .  Continue Toprol  XL Continue Eliquis  Continue hyperthyroidism treatment with methimazole  and prednisone  DCCV to reattempt per cardiology  Ultimately, plan eval for AF ablation.   Intermittent Vtach  Asymptomatic Cardiology team aware Continue telemetry Supplement K and Mg   Acute chest wall pain - resolved  Nonspecific likely multifactorial in setting of HF, Afib No ACS per cardiology monitor   Acute on chronic HFrEF (heart failure with reduced ejection fraction) 2D echo December 2024 with a EF of 20 to 25% Cardiology following Continue Lasix  Continue beta-blocker, Jardiance , and digoxin  Low blood pressure limiting GDMT Needs repeat echo in 3 months to assess for recovery of LVEF and ICD candidacy.     Hyperthyroidism admission December 2024 for amiodarone  induced hypothyroidism On methimazole  and recent course of prednisone  TSH <0.10  Free T4 4.66  continue methimazole  S/p stress dose of hydrocortisone  in the ER out of concern for hypotension and ? Adrenal insufficiency    Cough, question URI vs amiodarone  tox  Noted recurrent cough over multiple weeks with recent evaluation for issues including A-fib with RVR, acute on chronic HFrEF, amiodarone  induced hyperthyroidism CT of the chest on admission grossly stable apart from pleural effusions Add as needed DuoNebs PPI Consider pulmonary follow-up if persists given prior amiodarone  toxicity with potential for pulmonary impact   GERD (gastroesophageal reflux disease) PPI   Type 2 diabetes mellitus without complication (HCC)  Blood sugars 170s SSI  Monitor  Hypokalemia Replace as needed Monitor BMP   Class 1 obesity based on BMI: Body mass index is 32.38 kg/m.  Underweight - under 18  overweight - 25 to 29 obese - 30 or  more Class 1 obesity: BMI of 30.0 to 34 Class 2 obesity: BMI of 35.0 to 39 Class 3 obesity: BMI of 40.0 to 49 Super Morbid Obesity: BMI 50-59 Super-super Morbid Obesity: BMI 60+ Significantly low or high BMI is associated with higher medical risk.  Weight management advised as adjunct to other disease management and risk reduction treatments    DVT prophylaxis: eliquis  IV fluids: no continuous IV fluids  Nutrition: cardaic diet Central lines / invasive devices: noen  Code Status: FULL CODE ACP documentation reviewed:  none on file in [facility]  TOC needs: TBD but expect d/c back home with no home health needs  Barriers to dispo / significant pending items: DCCV per cardiology              Subjective / Brief ROS:  Patient reports no concerns at this time  Walking halls this morning and feeling well no chest pain   Family Communication:none at this time     Objective Findings:  Vitals:   09/23/23 1300 09/23/23 1315 09/23/23 1330 09/23/23 1400  BP: 91/70 94/67 91/61  95/73  Pulse: (!) 119 (!) 119 (!) 118 (!) 129  Resp: 20 20 18 18   Temp:      TempSrc:      SpO2: 95% 93% 95% 97%  Weight:      Height:       No intake or output data in the 24 hours ending 09/23/23 1712    Filed Weights   09/21/23 0548 09/22/23 0400 09/23/23 0510  Weight: 110.7 kg 105.3 kg 105.8 kg    Examination:  Physical Exam Constitutional:      General: He is not in acute distress. Cardiovascular:     Rate and Rhythm: Tachycardia present. Rhythm irregular.  Pulmonary:     Breath sounds: Normal breath sounds.  Neurological:     General: No focal deficit present.     Mental Status: He is alert and oriented to person, place, and time.  Psychiatric:        Mood and Affect: Mood normal.        Behavior: Behavior normal.          Scheduled Medications:   apixaban   5 mg Oral BID   benzonatate   200 mg Oral TID   digoxin   0.125 mg Oral Daily   empagliflozin   25 mg Oral Daily    FLUoxetine   40 mg Oral Daily   furosemide   80 mg Intravenous BID   insulin  aspart  0-15 Units Subcutaneous TID WC   methimazole   15 mg Oral TID   metoprolol  tartrate  25 mg Oral TID   pantoprazole   40 mg Oral QPM   potassium chloride   40 mEq Oral BID   pravastatin   40 mg Oral QHS    Continuous Infusions:  sodium chloride  10 mL/hr at 09/23/23 1210     PRN Medications:  guaiFENesin -dextromethorphan , ipratropium, levalbuterol , lidocaine -prilocaine , menthol -cetylpyridinium, ondansetron  **OR** ondansetron  (ZOFRAN ) IV, zolpidem   Antimicrobials from admission:  Anti-infectives (From admission, onward)    None           Data Reviewed:  I have personally reviewed the following...  CBC: Recent Labs  Lab 09/19/23 0303 09/20/23 0603 09/21/23 0503 09/22/23 0522 09/23/23 0436  WBC 9.7  10.7* 9.8 7.9 9.2  HGB 11.7* 13.0 13.9 12.4* 12.4*  HCT 36.8* 39.2 41.6 37.1* 37.4*  MCV 88.7 84.8 83.9 83.7 84.8  PLT 176 251 288 234 257   Basic Metabolic Panel: Recent Labs  Lab 09/18/23 0406 09/19/23 0303 09/20/23 0603 09/21/23 0503 09/21/23 0954 09/22/23 0522 09/22/23 1313 09/23/23 0436  NA  --    < > 135 133*  --  136 137 136  K  --    < > 3.4* 3.0*  --  2.9* 3.6 3.1*  CL  --    < > 96* 94*  --  96* 94* 94*  CO2  --    < > 26 24  --  26 29 31   GLUCOSE  --    < > 151* 168*  --  271* 240* 145*  BUN  --    < > 34* 39*  --  40* 40* 39*  CREATININE  --    < > 0.99 1.02  --  1.04 1.02 0.93  CALCIUM  --    < > 8.8* 8.7*  --  8.3* 8.7* 8.5*  MG 2.3  --   --   --  2.1  --   --  2.5*   < > = values in this interval not displayed.   GFR: Estimated Creatinine Clearance: 96.7 mL/min (by C-G formula based on SCr of 0.93 mg/dL). Liver Function Tests: Recent Labs  Lab 09/18/23 0400  AST 17  ALT 24  ALKPHOS 51  BILITOT 0.9  PROT 5.9*  ALBUMIN 3.0*   No results for input(s): LIPASE, AMYLASE in the last 168 hours. No results for input(s): AMMONIA in the last 168  hours. Coagulation Profile: No results for input(s): INR, PROTIME in the last 168 hours. Cardiac Enzymes: No results for input(s): CKTOTAL, CKMB, CKMBINDEX, TROPONINI in the last 168 hours. BNP (last 3 results) No results for input(s): PROBNP in the last 8760 hours. HbA1C: No results for input(s): HGBA1C in the last 72 hours. CBG: Recent Labs  Lab 09/22/23 0941 09/22/23 1215 09/22/23 1639 09/23/23 0928 09/23/23 1158  GLUCAP 258* 257* 89 150* 156*   Lipid Profile: No results for input(s): CHOL, HDL, LDLCALC, TRIG, CHOLHDL, LDLDIRECT in the last 72 hours. Thyroid  Function Tests: Recent Labs    09/22/23 0916  TSH <0.010*  FREET4 5.24*  T3FREE 11.7*    Anemia Panel: No results for input(s): VITAMINB12, FOLATE, FERRITIN, TIBC, IRON, RETICCTPCT in the last 72 hours. Most Recent Urinalysis On File:     Component Value Date/Time   COLORURINE YELLOW (A) 08/07/2016 1337   APPEARANCEUR CLEAR (A) 08/07/2016 1337   LABSPEC 1.020 08/07/2016 1337   PHURINE 5.0 08/07/2016 1337   GLUCOSEU NEGATIVE 08/07/2016 1337   HGBUR NEGATIVE 08/07/2016 1337   BILIRUBINUR NEGATIVE 08/07/2016 1337   KETONESUR NEGATIVE 08/07/2016 1337   PROTEINUR NEGATIVE 08/07/2016 1337   NITRITE NEGATIVE 08/07/2016 1337   LEUKOCYTESUR NEGATIVE 08/07/2016 1337   Sepsis Labs: @LABRCNTIP (procalcitonin:4,lacticidven:4) Microbiology: Recent Results (from the past 240 hours)  Resp panel by RT-PCR (RSV, Flu A&B, Covid) Anterior Nasal Swab     Status: None   Collection Time: 09/17/23  5:55 AM   Specimen: Anterior Nasal Swab  Result Value Ref Range Status   SARS Coronavirus 2 by RT PCR NEGATIVE NEGATIVE Final    Comment: (NOTE) SARS-CoV-2 target nucleic acids are NOT DETECTED.  The SARS-CoV-2 RNA is generally detectable in upper respiratory specimens during the acute phase of infection. The lowest concentration of  SARS-CoV-2 viral copies this assay can detect is 138  copies/mL. A negative result does not preclude SARS-Cov-2 infection and should not be used as the sole basis for treatment or other patient management decisions. A negative result may occur with  improper specimen collection/handling, submission of specimen other than nasopharyngeal swab, presence of viral mutation(s) within the areas targeted by this assay, and inadequate number of viral copies(<138 copies/mL). A negative result must be combined with clinical observations, patient history, and epidemiological information. The expected result is Negative.  Fact Sheet for Patients:  bloggercourse.com  Fact Sheet for Healthcare Providers:  seriousbroker.it  This test is no t yet approved or cleared by the [place]  FDA and  has been authorized for detection and/or diagnosis of SARS-CoV-2 by FDA under an Emergency Use Authorization (EUA). This EUA will remain  in effect (meaning this test can be used) for the duration of the COVID-19 declaration under Section 564(b)(1) of the Act, 21 U.S.C.section 360bbb-3(b)(1), unless the authorization is terminated  or revoked sooner.       Influenza A by PCR NEGATIVE NEGATIVE Final   Influenza B by PCR NEGATIVE NEGATIVE Final    Comment: (NOTE) The Xpert Xpress SARS-CoV-2/FLU/RSV plus assay is intended as an aid in the diagnosis of influenza from Nasopharyngeal swab specimens and should not be used as a sole basis for treatment. Nasal washings and aspirates are unacceptable for Xpert Xpress SARS-CoV-2/FLU/RSV testing.  Fact Sheet for Patients: bloggercourse.com  Fact Sheet for Healthcare Providers: seriousbroker.it  This test is not yet approved or cleared by the [place]  FDA and has been authorized for detection and/or diagnosis of SARS-CoV-2 by FDA under an Emergency Use Authorization (EUA). This EUA will remain in effect (meaning  this test can be used) for the duration of the COVID-19 declaration under Section 564(b)(1) of the Act, 21 U.S.C. section 360bbb-3(b)(1), unless the authorization is terminated or revoked.     Resp Syncytial Virus by PCR NEGATIVE NEGATIVE Final    Comment: (NOTE) Fact Sheet for Patients: bloggercourse.com  Fact Sheet for Healthcare Providers: seriousbroker.it  This test is not yet approved or cleared by the [place]  FDA and has been authorized for detection and/or diagnosis of SARS-CoV-2 by FDA under an Emergency Use Authorization (EUA). This EUA will remain in effect (meaning this test can be used) for the duration of the COVID-19 declaration under Section 564(b)(1) of the Act, 21 U.S.C. section 360bbb-3(b)(1), unless the authorization is terminated or revoked.  Performed at [facility], [address] [place], [place] [address]   Respiratory (~20 pathogens) panel by PCR     Status: None   Collection Time: 09/19/23  6:50 PM   Specimen: Nasopharyngeal Swab; Respiratory  Result Value Ref Range Status   Adenovirus NOT DETECTED NOT DETECTED Final   Coronavirus 229E NOT DETECTED NOT DETECTED Final    Comment: (NOTE) The Coronavirus on the Respiratory Panel, DOES NOT test for the novel  Coronavirus (2019 nCoV)    Coronavirus HKU1 NOT DETECTED NOT DETECTED Final   Coronavirus NL63 NOT DETECTED NOT DETECTED Final   Coronavirus OC43 NOT DETECTED NOT DETECTED Final   Metapneumovirus NOT DETECTED NOT DETECTED Final   Rhinovirus / Enterovirus NOT DETECTED NOT DETECTED Final   Influenza A NOT DETECTED NOT DETECTED Final   Influenza B NOT DETECTED NOT DETECTED Final   Parainfluenza Virus 1 NOT DETECTED NOT DETECTED Final   Parainfluenza Virus 2 NOT DETECTED NOT DETECTED Final   Parainfluenza Virus 3 NOT DETECTED  NOT DETECTED Final   Parainfluenza Virus 4 NOT DETECTED NOT DETECTED Final   Respiratory Syncytial Virus NOT  DETECTED NOT DETECTED Final   Bordetella pertussis NOT DETECTED NOT DETECTED Final   Bordetella Parapertussis NOT DETECTED NOT DETECTED Final   Chlamydophila pneumoniae NOT DETECTED NOT DETECTED Final   Mycoplasma pneumoniae NOT DETECTED NOT DETECTED Final    Comment: Performed at [facility], [address], [place], [place] [address]      Radiology Studies last 3 days: No results found.       William Cortright, DO [facility] 09/23/2023, 5:12 PM    Dictation software may have been used to generate the above note. Typos may occur and escape review in typed/dictated notes. Please contact Dr Marsa directly for clarity if needed.  Staff may message me via secure chat in Epic  but this may not receive an immediate response,  please page me for urgent matters!  If 7PM-7AM, please contact night coverage [email or web address]

## 2023-09-23 NOTE — Progress Notes (Signed)
 Patient ID: William [name]., male   DOB: [birth year], 68 y.o.   MRN: [id number]     Advanced Heart Failure Rounding Note  Cardiologist: Evalene Lunger, MD  Chief Complaint: Shortness of breath Subjective:   Still in atrial fibrillation with RVR.    Short runs of NSVT yesterday, electrolytes repleted. Patient asymptomatic.   Feels fine just didn't sleep much again last night. Denies CP/SOB.   Objective:   Weight Range: 105.8 kg Body mass index is 32.53 kg/m.   Vital Signs:   Temp:  [97.6 F (36.4 C)-98.5 F (36.9 C)] 98.2 F (36.8 C) (01/14 0758) Pulse Rate:  [70-128] 128 (01/14 0758) Resp:  [16-18] 17 (01/14 0758) BP: (79-101)/(54-78) 93/63 (01/14 0758) SpO2:  [92 %-98 %] 97 % (01/14 0758) Weight:  [105.8 kg] 105.8 kg (01/14 0510) Last BM Date : 09/22/23  Weight change: Filed Weights   09/21/23 0548 09/22/23 0400 09/23/23 0510  Weight: 110.7 kg 105.3 kg 105.8 kg   Intake/Output:  No intake or output data in the 24 hours ending 09/23/23 0805   Physical Exam  General:  well appearing.  No respiratory difficulty HEENT: normal Neck: supple. JVD ~10 cm. Carotids 2+ bilat; no bruits. No lymphadenopathy or thyromegaly appreciated. Cor: PMI nondisplaced. Irregular rate & rhythm. No rubs, gallops or murmurs. Lungs: clear Abdomen: soft, nontender, nondistended. No hepatosplenomegaly. No bruits or masses. Good bowel sounds. Extremities: no cyanosis, clubbing, rash, +2 RLE edema, +1 LLE edema  Neuro: alert & oriented x 3, cranial nerves grossly intact. moves all 4 extremities w/o difficulty. Affect pleasant.  Telemetry   Atrial fibrillation rate 110s-130s, short runs NSVT (personally reviewed)  Labs    CBC Recent Labs    09/22/23 0522 09/23/23 0436  WBC 7.9 9.2  HGB 12.4* 12.4*  HCT 37.1* 37.4*  MCV 83.7 84.8  PLT 234 257   Basic Metabolic Panel Recent Labs    98/87/74 0954 09/22/23 0522 09/22/23 1313 09/23/23 0436  NA  --    < > 137 136  K  --    < > 3.6  3.1*  CL  --    < > 94* 94*  CO2  --    < > 29 31  GLUCOSE  --    < > 240* 145*  BUN  --    < > 40* 39*  CREATININE  --    < > 1.02 0.93  CALCIUM  --    < > 8.7* 8.5*  MG 2.1  --   --  2.5*   < > = values in this interval not displayed.   Liver Function Tests No results for input(s): AST, ALT, ALKPHOS, BILITOT, PROT, ALBUMIN in the last 72 hours.  No results for input(s): LIPASE, AMYLASE in the last 72 hours. Cardiac Enzymes No results for input(s): CKTOTAL, CKMB, CKMBINDEX, TROPONINI in the last 72 hours.  BNP: BNP (last 3 results) Recent Labs    08/19/23 2232 09/17/23 0444  BNP 1,995.5* 2,499.1*    ProBNP (last 3 results) No results for input(s): PROBNP in the last 8760 hours.   D-Dimer No results for input(s): DDIMER in the last 72 hours. Hemoglobin A1C No results for input(s): HGBA1C in the last 72 hours. Fasting Lipid Panel No results for input(s): CHOL, HDL, LDLCALC, TRIG, CHOLHDL, LDLDIRECT in the last 72 hours. Thyroid  Function Tests Recent Labs    09/22/23 0916  TSH <0.010*  T3FREE 11.7*     Other results:   Imaging  No results found.   Medications:     Scheduled Medications:  apixaban   5 mg Oral BID   benzonatate   200 mg Oral TID   digoxin   0.125 mg Oral Daily   empagliflozin   25 mg Oral Daily   FLUoxetine   40 mg Oral Daily   furosemide   80 mg Intravenous BID   insulin  aspart  0-15 Units Subcutaneous TID WC   methimazole   15 mg Oral TID   metoprolol  succinate  25 mg Oral BID   pantoprazole   40 mg Oral QPM   potassium chloride   40 mEq Oral Q3H   pravastatin   40 mg Oral QHS   sodium chloride  flush  3-10 mL Intravenous Q12H    Infusions:   PRN Medications: guaiFENesin -dextromethorphan , ipratropium, levalbuterol , menthol -cetylpyridinium, ondansetron  **OR** ondansetron  (ZOFRAN ) IV, sodium chloride  flush, zolpidem    Assessment/Plan  1. Atrial fibrillation: Persistent, in setting of  suspected type 2 amiodarone  induced thyrotoxicity.  He was taking a lower dose of methimazole  than prescribed and also did not get prednisone  at home.  He had prior episode of AF in 2021, had been on amiodarone  since that time.  Amiodarone  was stopped in 12/24 with type 2 AIT.  Today, HR in 120s-130s.  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  Needs to get back into NSR though antiarrhythmic options are limited.  Would avoid amiodarone  use unless absolutely necessary, and with persistently long QTc, I do not think that he will be able to take Tikosyn.  - Continue digoxin  for rate control.  - Continue Toprol  XL 25 mg daily, would not increase dose with soft BP.  - Continue Eliquis  5 mg bid.  - Continue hyperthyroidism treatment with methimazole  and prednisone .   - Patient had Mounjaro on 1/7. Unable to be sedate for TEE-guided DCCV until 1/14 safely. Plan for TEE/DCCV today. - Ultimately, he should be evaluated for AF ablation.  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 Thyroid  stimulating antibodies were negative in 12/24, less likely type 1. He was taking lower than prescribed dose of methimazole  at home and never got prednisone  after last discharge.  - Methimazole  15 mg tid.  - Continue prednisone  40 mg daily.  Slow taper over 1-3 months depending on thyroid  labs. When he goes home, will need PPI and Bactrim prophylaxis.  - Needs to see an endocrinologist.  3. Acute on chronic systolic CHF: Presumed nonischemic cardiomyopathy, ?tachycardia-mediated with AF/RVR.  Echo in 3/21 with EF < 20%.  He was in atrial fibrillation with RVR at that time, converted to NSR on amiodarone .  Slow improvement in EF over time, echo in 4/24 with EF 40-45%, moderate aortic stenosis.  Recurrent AF/RVR in 12/24, echo with EF 20-25% with normal RV, mild-moderate MR, moderate AS with mean gradient 16 mmHg. Suspect fall in LV systolic function was tachycardia-mediated. He  remains in AF/RVR.  SBP 80s-90s, no room to titrate GDMT. Reports good diuresis/UOP but no I/Os done. Weight overall down 18lbs. - Volume still mildly overloaded. Continue Lasix  80 mg IV bid, replace K. Follow I/Os. Give 2.5 metolazone  this morning + additional KDUR  - Continue digoxin  0.125. Dig level 0.7 09/21/23 (dose cut back) - Continue Toprol  XL 25 daily.  - Continue Jardiance .  - Continue UNNA boots - Needs conversion back to NSR.  - If EF does not improve significantly in NSR, consider right/left heart cath in future.  4. Aortic stenosis: Moderate on echo this admission.  5. DM2: Watch glucose with steroid use.   Length of  Stay: 6  Beckey LITTIE Coe, NP  09/23/2023, 8:05 AM  Advanced Heart Failure Team Pager [phone] (M-F; 7a - 5p)  Please contact [facility] for night-coverage after hours (5p -7a ) and weekends on [email or web address]

## 2023-09-23 NOTE — Transfer of Care (Signed)
 Immediate Anesthesia Transfer of Care Note  Patient: William [name].  Procedure(s) Performed: TRANSESOPHAGEAL ECHOCARDIOGRAM (TEE)  Patient Location: PACU  Anesthesia Type:MAC  Level of Consciousness: responds to stimulation  Airway & Oxygen  Therapy: Patient Spontanous Breathing and Patient connected to nasal cannula oxygen   Post-op Assessment: Report given to RN and Post -op Vital signs reviewed and stable  Post vital signs: stable  Last Vitals:  Vitals Value Taken Time  BP    Temp    Pulse    Resp    SpO2      Last Pain:  Vitals:   09/23/23 1143  TempSrc: Oral  PainSc: 0-No pain      Patients Stated Pain Goal: 0 (09/18/23 0050)  Complications: No notable events documented.

## 2023-09-23 NOTE — Progress Notes (Signed)
*  PRELIMINARY RESULTS* Echocardiogram Echocardiogram Transesophageal has been performed.  William [name] 09/23/2023, 12:48 PM

## 2023-09-23 NOTE — Anesthesia Preprocedure Evaluation (Signed)
 Anesthesia Evaluation  Patient identified by MRN, date of birth, ID band Patient awake    Reviewed: Allergy & Precautions, NPO status , Patient's Chart, lab work & pertinent test results  History of Anesthesia Complications Negative for: history of anesthetic complications  Airway Mallampati: II  TM Distance: >3 FB Neck ROM: Full    Dental  (+) Dental Advidsory Given   Pulmonary shortness of breath and with exertion, neg sleep apnea, neg COPD, neg recent URI   breath sounds clear to auscultation- rhonchi (-) wheezing      Cardiovascular hypertension, (-) angina +CHF  (-) CAD, (-) Past MI and (-) Cardiac Stents + dysrhythmias Atrial Fibrillation  Rhythm:Regular Rate:Normal - Systolic murmurs and - Diastolic murmurs    Neuro/Psych neg Seizures PSYCHIATRIC DISORDERS Anxiety Depression    negative neurological ROS     GI/Hepatic Neg liver ROS, PUD,GERD  ,,  Endo/Other  diabetes, Oral Hypoglycemic Agents    Renal/GU negative Renal ROS     Musculoskeletal  (+) Arthritis ,    Abdominal  (+) + obese  Peds  Hematology  (+) Blood dyscrasia, anemia   Anesthesia Other Findings Past Medical History: No date: Anxiety No date: Cancer [facility])     Comment:  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 No date: Diabetes mellitus without complication (HCC) No date: GERD (gastroesophageal reflux disease) No date: Iron deficiency anemia No date: Obesity No date: Osteoarthritis of hip     Comment:  right No date: Peptic ulcer disease   Reproductive/Obstetrics                             Anesthesia Physical Anesthesia Plan  ASA: 3  Anesthesia Plan: General   Post-op Pain Management:    Induction: Intravenous  PONV Risk Score and Plan: 1 and Propofol  infusion and TIVA  Airway Management Planned: Natural Airway and Nasal Cannula  Additional Equipment:   Intra-op Plan:   Post-operative Plan:   Informed Consent: I  have reviewed the patients History and Physical, chart, labs and discussed the procedure including the risks, benefits and alternatives for the proposed anesthesia with the patient or authorized representative who has indicated his/her understanding and acceptance.     Dental advisory given  Plan Discussed with: CRNA and Anesthesiologist  Anesthesia Plan Comments:         Anesthesia Quick Evaluation

## 2023-09-24 ENCOUNTER — Encounter: Payer: Managed Care, Other (non HMO) | Admitting: Family

## 2023-09-24 ENCOUNTER — Encounter: Payer: Self-pay | Admitting: Cardiology

## 2023-09-24 DIAGNOSIS — R0789 Other chest pain: Secondary | ICD-10-CM | POA: Diagnosis not present

## 2023-09-24 DIAGNOSIS — I4891 Unspecified atrial fibrillation: Secondary | ICD-10-CM | POA: Diagnosis not present

## 2023-09-24 LAB — BASIC METABOLIC PANEL
Anion gap: 13 (ref 5–15)
BUN: 40 mg/dL — ABNORMAL HIGH (ref 8–23)
CO2: 30 mmol/L (ref 22–32)
Calcium: 8.5 mg/dL — ABNORMAL LOW (ref 8.9–10.3)
Chloride: 93 mmol/L — ABNORMAL LOW (ref 98–111)
Creatinine, Ser: 1.03 mg/dL (ref 0.61–1.24)
GFR, Estimated: >60 mL/min (ref >60)
Glucose, Bld: 241 mg/dL — ABNORMAL HIGH (ref 70–99)
Potassium: 3.4 mmol/L — ABNORMAL LOW (ref 3.5–5.1)
Sodium: 136 mmol/L (ref 135–145)

## 2023-09-24 LAB — GLUCOSE, CAPILLARY
Glucose-Capillary: 175 mg/dL — ABNORMAL HIGH (ref 70–99)
Glucose-Capillary: 210 mg/dL — ABNORMAL HIGH (ref 70–99)
Glucose-Capillary: 293 mg/dL — ABNORMAL HIGH (ref 70–99)
Glucose-Capillary: 316 mg/dL — ABNORMAL HIGH (ref 70–99)

## 2023-09-24 LAB — MAGNESIUM: Magnesium: 2.5 mg/dL — ABNORMAL HIGH (ref 1.7–2.4)

## 2023-09-24 MED ORDER — PROPYLTHIOURACIL 50 MG PO TABS: 50.0000 mg | ORAL_TABLET | Freq: Three times a day (TID) | ORAL | Status: DC

## 2023-09-24 MED ORDER — HYDROCORTISONE SOD SUC (PF) 100 MG IJ SOLR
100.0000 mg | Freq: Three times a day (TID) | INTRAMUSCULAR | Status: DC
Start: 2023-09-24 — End: 2023-09-25
  Administered 2023-09-24 – 2023-09-25 (×3): 100 mg via INTRAVENOUS
  Filled 2023-09-24 (×4): qty 2

## 2023-09-24 MED ORDER — PREDNISONE 20 MG PO TABS
40.0000 mg | ORAL_TABLET | Freq: Every day | ORAL | Status: DC
  Administered 2023-09-24: 40 mg via ORAL
  Filled 2023-09-24: qty 2

## 2023-09-24 MED ORDER — POTASSIUM CHLORIDE CRYS ER 20 MEQ PO TBCR
40.0000 meq | EXTENDED_RELEASE_TABLET | Freq: Once | ORAL | Status: AC
  Administered 2023-09-24: 40 meq via ORAL
  Filled 2023-09-24: qty 2

## 2023-09-24 MED ORDER — INSULIN ASPART 100 UNIT/ML IJ SOLN
0.0000 [IU] | Freq: Three times a day (TID) | INTRAMUSCULAR | Status: DC
Start: 2023-09-24 — End: 2023-09-24

## 2023-09-24 MED ORDER — POTASSIUM CHLORIDE 10 MEQ/100ML IV SOLN
10.0000 meq | INTRAVENOUS | Status: DC
  Administered 2023-09-24: 10 meq via INTRAVENOUS
  Filled 2023-09-24 (×4): qty 100

## 2023-09-24 MED ORDER — INSULIN ASPART 100 UNIT/ML IJ SOLN
0.0000 [IU] | Freq: Three times a day (TID) | INTRAMUSCULAR | Status: DC
  Administered 2023-09-24: 11 [IU] via SUBCUTANEOUS
  Administered 2023-09-25: 2 [IU] via SUBCUTANEOUS
  Administered 2023-09-25: 5 [IU] via SUBCUTANEOUS
  Filled 2023-09-24 (×3): qty 1

## 2023-09-24 MED ORDER — PROPRANOLOL HCL 10 MG PO TABS
10.0000 mg | ORAL_TABLET | Freq: Three times a day (TID) | ORAL | Status: DC
  Administered 2023-09-24 – 2023-09-25 (×3): 10 mg via ORAL
  Filled 2023-09-24 (×4): qty 1

## 2023-09-24 MED ORDER — SODIUM CHLORIDE 0.9 % IV BOLUS: 500.0000 mL | Freq: Once | INTRAVENOUS | Status: DC

## 2023-09-24 NOTE — Progress Notes (Signed)
 Patient ID: Opal Bill., male   DOB: Jun 07, 1956, 68 y.o.   MRN: [id number]     Advanced Heart Failure Rounding Note  Cardiologist: Belva Boyden, MD  Chief Complaint: Shortness of breath Subjective:   Failed DCCV yesterday x3. TEE read pending  Feels ok this morning, has not been sleeping well.   Objective:   Weight Range: 104.5 kg Body mass index is 32.13 kg/m.   Vital Signs:   Temp:  [97.7 F (36.5 C)-98.9 F (37.2 C)] 98.7 F (37.1 C) (01/15 0747) Pulse Rate:  [64-130] 100 (01/15 0747) Resp:  [12-20] 17 (01/15 0747) BP: (81-95)/(52-78) 88/66 (01/15 0747) SpO2:  [93 %-98 %] 96 % (01/15 0747) Weight:  [104.5 kg] 104.5 kg (01/15 0555) Last BM Date : 09/23/23  Weight change: Filed Weights   09/22/23 0400 09/23/23 0510 09/24/23 0555  Weight: 105.3 kg 105.8 kg 104.5 kg   Intake/Output:  No intake or output data in the 24 hours ending 09/24/23 0818   Physical Exam  General:  well appearing.  No respiratory difficulty HEENT: normal Neck: supple. JVD ~8 cm. Carotids 2+ bilat; no bruits. No lymphadenopathy or thyromegaly appreciated. Cor: PMI nondisplaced. Irregular rate & rhythm. No rubs, gallops or murmurs. Lungs: clear Abdomen: soft, nontender, nondistended. No hepatosplenomegaly. No bruits or masses. Good bowel sounds. Extremities: no cyanosis, clubbing, rash, trace BLE edema. + UNNA boots Neuro: alert & oriented x 3, cranial nerves grossly intact. moves all 4 extremities w/o difficulty. Affect pleasant.  Telemetry   Atrial fibrillation 100-110s, intt PVCs (personally reviewed)  Labs    CBC Recent Labs    09/22/23 0522 09/23/23 0436  WBC 7.9 9.2  HGB 12.4* 12.4*  HCT 37.1* 37.4*  MCV 83.7 84.8  PLT 234 257   Basic Metabolic Panel Recent Labs    38/75/64 0436 09/24/23 0439  NA 136 136  K 3.1* 3.4*  CL 94* 93*  CO2 31 30  GLUCOSE 145* 241*  BUN 39* 40*  CREATININE 0.93 1.03  CALCIUM 8.5* 8.5*  MG 2.5* 2.5*   Liver Function Tests No  results for input(s): "AST", "ALT", "ALKPHOS", "BILITOT", "PROT", "ALBUMIN" in the last 72 hours.  No results for input(s): "LIPASE", "AMYLASE" in the last 72 hours. Cardiac Enzymes No results for input(s): "CKTOTAL", "CKMB", "CKMBINDEX", "TROPONINI" in the last 72 hours.  BNP: BNP (last 3 results) Recent Labs    08/19/23 2232 09/17/23 0444  BNP 1,995.5* 2,499.1*    ProBNP (last 3 results) No results for input(s): "PROBNP" in the last 8760 hours.   D-Dimer No results for input(s): "DDIMER" in the last 72 hours. Hemoglobin A1C No results for input(s): "HGBA1C" in the last 72 hours. Fasting Lipid Panel No results for input(s): "CHOL", "HDL", "LDLCALC", "TRIG", "CHOLHDL", "LDLDIRECT" in the last 72 hours. Thyroid  Function Tests Recent Labs    09/22/23 0916  TSH <0.010*  T3FREE 11.7*     Other results:   Imaging    No results found.   Medications:     Scheduled Medications:  apixaban   5 mg Oral BID   benzonatate   200 mg Oral TID   digoxin   0.125 mg Oral Daily   empagliflozin   25 mg Oral Daily   FLUoxetine   40 mg Oral Daily   furosemide   80 mg Intravenous BID   insulin  aspart  0-15 Units Subcutaneous TID WC   methimazole   15 mg Oral TID   metoprolol  tartrate  25 mg Oral TID   pantoprazole   40 mg Oral  QPM   pravastatin   40 mg Oral QHS   predniSONE   40 mg Oral Q breakfast    Infusions:  sodium chloride  10 mL/hr at 09/23/23 1210   potassium chloride       PRN Medications: guaiFENesin -dextromethorphan , ipratropium, levalbuterol , lidocaine -prilocaine , menthol -cetylpyridinium, ondansetron  **OR** ondansetron  (ZOFRAN ) IV, zolpidem    Assessment/Plan  1. 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  He was taking a lower dose of methimazole  than prescribed and also did not get prednisone  at home.  He had prior episode of AF in 2021, had been on amiodarone  since that time.  Amiodarone  was stopped in 12/24 with  type 2 AIT.  Today, HR in 120s-130s.  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  Needs to get back into NSR though antiarrhythmic options are limited.  Would avoid amiodarone  use unless absolutely necessary, and with persistently long QTc,  do not think that he will be able to take Tikosyn.  - Continue digoxin  for rate control.  - Continue metoprolol  tartrate 25 mg TID, higher doses may be limited with systolic HF - Continue Eliquis  5 mg bid.  - Continue hyperthyroidism treatment with methimazole  and prednisone .   - TEE/DCCV 1/14 failed conversion x3 - Ultimately, he should be evaluated for AF ablation.  - EP consulted today 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 Thyroid  stimulating antibodies were negative in 12/24, less likely type 1. He was taking lower than prescribed dose of methimazole  at home and never got prednisone  after last discharge.  - Methimazole  15 mg tid.  - Restart prednisone  40 mg daily.  Slow taper over 1-3 months depending on thyroid  labs. When he goes home, will need PPI and Bactrim prophylaxis. Stopped by hospitalist 09/19/23? - May need potassium iodide  + propranolol  but BP soft - Needs to see an endocrinologist.  3. Acute on chronic systolic CHF: Presumed nonischemic cardiomyopathy, ?tachycardia-mediated with AF/RVR.  Echo in 3/21 with EF < 20%.  He was in atrial fibrillation with RVR at that time, converted to NSR on amiodarone .  Slow improvement in EF over time, echo in 4/24 with EF 40-45%, moderate aortic stenosis.  Recurrent AF/RVR in 12/24, echo with EF 20-25% with normal RV, mild-moderate MR, moderate AS with mean gradient 16 mmHg. Suspect fall in LV systolic function was tachycardia-mediated. He remains in AF/RVR.  SBP 80s-90s, no room to titrate GDMT. Reports good diuresis/UOP but no I/Os done. Weight overall down 18lbs. - Volume still mildly overloaded. Continue Lasix  80 mg IV bid. No further metolazone  today.  Suspect he may be ready for PO diuretics tomorrow.  - Continue digoxin  0.125. Dig level 0.7 09/21/23 (dose cut back) - Continue metoprolol  tartrate 25 mg TID.  - Continue Jardiance .  - Continue UNNA boots - Conversion back to NSR difficult with hyperthyroid.  - If EF does not improve significantly in NSR, consider right/left heart cath in future.  4. Aortic stenosis: Moderate on echo this admission.  5. DM2: Watch glucose with steroid use.   Length of Stay: 7  Sheryl Donna, NP  09/24/2023, 8:18 AM  Advanced Heart Failure Team Pager [phone] (M-F; 7a - 5p)  Please contact [facility] for night-coverage after hours (5p -7a ) and weekends on [email or web address]

## 2023-09-24 NOTE — Consult Note (Signed)
 Electrophysiology consultation   Patient ID: William [name]. MRN: [id number]; DOB: May 31, 1956  Admit date: 09/17/2023 Date of Consult: 09/24/2023  PCP:  William Spoon, MD   History of Present Illness:   William [name] is a 68 year old man who I am seeing today for an evaluation of tachycardia and atrial fibrillation at the request of Dr. Bruce [name].  The patient has a long history of atrial fibrillation that was first diagnosed in 2021 after his COVID vaccine.  At that time he was receiving care with a [facility] clinic.  He was cardioverted and placed on amiodarone .  There was infrequent follow-up after that time.  He then presented in December 2024 with atrial fibrillation and hyperthyroidism.  He was treated with steroids and methimazole .  His amiodarone  was stopped in December.  He presented to [facility] January 8 with progressively worsening shortness of breath.  He was in decompensated systolic heart failure.  He denies fever.  He reports tremulousness.  He reports increased heart rates.  He reports unintentional weight loss.  He reports hair changes, nonspecific.  During his hospital stay so far, his heart rates have been in the 120s in atrial fibrillation.  Hypotension has limited up titration of beta-blockade.  His QT is long preventing use of Tikosyn.  He underwent a cardioversion yesterday that was unsuccessful x 3.  I am asked to weigh in about alternative options for his atrial fibrillation.    Past medical, surgical, social and family history reviewed.  ROS:  Please see the history of present illness.  All other ROS reviewed and negative.     Physical Exam/Data:   Vitals:   09/23/23 2318 09/24/23 0317 09/24/23 0555 09/24/23 0747  BP: (!) 87/73 (!) 89/64  (!) 88/66  Pulse: (!) 125 (!) 122  100  Resp: 18 18 12 17   Temp: 98.5 F (36.9 C) 97.8 F (36.6 C)  98.7 F (37.1 C)  TempSrc:    Oral  SpO2: 96% 94%  96%  Weight:   104.5 kg   Height:        General:  Anxious appearing, in bed at 45 degrees Cardiac: Irregularly irregular, tachycardic.  Tepid extremities.  Lower extremity pitting edema.  Lungs:  clear to auscultation bilaterally, no wheezing, rhonchi or rales  Psych:  Normal affect   EKG:  The EKG was personally reviewed and demonstrates: Atrial fibrillation with rapid ventricular rates Telemetry:  Telemetry was personally reviewed and demonstrates: Atrial fibrillation    Assessment and Plan:   #Persistent atrial fibrillation #Thyroid  storm #Hypothyroidism #Acute on chronic systolic heart failure  The patient is admitted with acute heart failure in the setting of thyroid  storm.  This thyroid  dysregulation is secondary to amiodarone  which has been on board for several years.  His atrial fibrillation has been refractory to cardioversion and beta-blockade.  Antiarrhythmic options are limited because of QTc prolongation.  His atrial fibrillation is a secondary process to the thyroid  dysregulation.  I am concerned about his overall clinical trajectory given his hypotension, severity of LV dysfunction.  I did touch base with the pharmacy team and heart failure team this morning.  Recommend restarting the steroids now.  Unclear why these were stopped.  Recommend continuing methimazole .  Recommend continuing cautious use of beta-blocker although I am concerned about his cardiac output.  He may ultimately require right heart catheterization to better define his cardiac output.  I have discussed with the patient and the heart failure team transfer to [facility] for  inpatient endocrinology consultation with continued care of his severe cardiomyopathy.  I do not think repeat cardioversion will be helpful until a more definitive plan for his thyroid  thyroid  is determined.    Donelda Fujita T. Marven Slimmer, MD, [facility], [facility] Cardiac Electrophysiology

## 2023-09-24 NOTE — Progress Notes (Signed)
 Progress Note   Patient: William [name]. BJY:[id number] DOB: 1956/01/16 DOA: 09/17/2023     7 DOS: the patient was seen and examined on 09/24/2023   Brief hospital course: Hospital course / significant events:   HPI:  William [name]. is a 67 y.o. male with medical history significant of HFrEF, atrial fibrillation, type 2 diabetes, aortic stenosis, history of gastric bypass, amiodarone  induced hyperthyroidism presenting with cough, chest pain. Had been admitted 08/2023 for decompensated heart failure, A-fib with RVR, amiodarone  induced hypothyroidism. Has been on methimazole , prednisone  taper, Eliquis , metoprolol , Lasix . Patient states he was discharged with a cough that progressively improved but then worsened acutely over the past 1 to 2 weeks. Has had mild nonspecific chest pain over the past few days.    01/08: admitted for chest pain r/o ACS, exacerbation HFrEF, Afib w/ RVR - given soft BP have held rate control and diuresing w/ caution. Cardiology consulted - starting IV digoxin  and transition to po tomorrow, cautious lasix , GDMT limited by hypotension. Of note, taking methimazole  5 mg tid not 15 mg tid as rx.  01/09: HR still elevated, cardio recs for DCCV planning this for tomorrow  01/10: delay in DCCV - cannot safely sedate given recent mounjaro per anesthesia - at this point given high risk for general anesthesia planis to wait the standard 7 days from last dose which puts him at 09/23/23 unless he decompensates in the meantime.  01/11-01/12: continuing diuresis, Planning for TEE/DCCV on 1/14 after Mounjaro has been held for 1 week. 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  Blood pressure is low,  Cardiology following: Continue digoxin , Toprol  XL Eliquis  Continue treatment with methimazole : change prednisone  to IV Hydrocortisone  100mg  Q8hrs  No further DCCV by cardiology (failed conversion x3) [facility] for transfer to inpatient endocrinology--> no beds at this time--> denied transfer-> recommended to call back tomorrow  Hypotension: likely from low EF, Gave low volume IVF bolus 250cc, f/u BP  Intermittent Vtach  Asymptomatic Cardiology team aware Continue telemetry Supplement K and Mg   Acute chest wall pain - resolved  Nonspecific likely multifactorial in setting of HF, Afib No ACS per cardiology monitor   Acute on chronic HFrEF (heart failure with reduced ejection fraction) 2D echo December 2024 with a EF of 20 to 25% Cardiology following Continue Lasix  Continue beta-blocker, Jardiance , and digoxin  Low blood pressure limiting GDMT Needs repeat echo in 3 months to assess for recovery of LVEF and ICD candidacy.     Thyroid  storm/Severe Hyperthyroidism admission December 2024 for amiodarone  induced hypothyroidism On methimazole  and recent course of prednisone  TSH <0.10  Free T4 4.66  continue methimazole  change prednisone  to IV Hydrocortisone  100mg  Q8hrs  [facility] for transfer to inpatient endocrinology--> no beds at this time--> denied transfer-> recommended to call back tomorrow   Cough, question URI vs amiodarone  tox  Noted recurrent cough over multiple weeks with recent evaluation for issues including A-fib with RVR, acute on chronic HFrEF, amiodarone   induced hyperthyroidism CT of the chest on admission grossly stable apart from pleural effusions Add as needed DuoNebs PPI Consider pulmonary  follow-up if persists given prior amiodarone  toxicity with potential for pulmonary impact   GERD (gastroesophageal reflux disease) PPI   Type 2 diabetes mellitus without complication (HCC) Blood sugars 170s SSI  Monitor  Hypokalemia Replace with PO K+ Monitor BMP   Class 1 obesity based on BMI: Body mass index is 32.38 kg/m.  Underweight - under 18  overweight - 25 to 29 obese - 30 or more Class 1 obesity: BMI of 30.0 to 34 Class 2 obesity: BMI of 35.0 to 39 Class 3 obesity: BMI of 40.0 to 49 Super Morbid Obesity: BMI 50-59 Super-super Morbid Obesity: BMI 60+ Significantly low or high BMI is associated with higher medical risk.  Weight management advised as adjunct to other disease management and risk reduction treatments   DVT prophylaxis: eliquis  IV fluids: no continuous IV fluids  Nutrition: cardaic diet Central lines / invasive devices: noen  Code Status: FULL CODE ACP documentation reviewed:  none on file in [facility]  TOC needs: TBD but expect d/c back home with no home health needs  Barriers to dispo / significant pending items: DCCV per cardiology       Subjective: Denies fever, chills,  Has mild dizziness Denies difficulty breath, no swelling in legs, abdominal symptoms  Physical Exam: Vitals:   09/24/23 1150 09/24/23 1200 09/24/23 1300 09/24/23 1400  BP: (!) 73/44 (!) 78/56 90/63 (!) 88/63  Pulse: (!) 113 (!) 117 (!) 119 (!) 115  Resp: 20     Temp: 98.3 F (36.8 C)     TempSrc: Oral     SpO2: 98%     Weight:      Height:      Physical Exam Constitutional:      General: He is not in acute distress.    Appearance: He is not ill-appearing.  HENT:     Head: Normocephalic and atraumatic.     Nose: Nose normal. No congestion.     Mouth/Throat:     Mouth: Mucous membranes are moist.     Pharynx: Oropharynx is clear. No oropharyngeal exudate.  Eyes:     Extraocular Movements: Extraocular movements intact.     Conjunctiva/sclera: Conjunctivae  normal.  Cardiovascular:     Rate and Rhythm: Tachycardia present. Rhythm irregular.  Pulmonary:     Effort: Pulmonary effort is normal.     Breath sounds: Normal breath sounds.  Abdominal:     General: Abdomen is flat. Bowel sounds are normal.     Palpations: Abdomen is soft.  Musculoskeletal:        General: No tenderness. Normal range of motion.     Cervical back: Normal range of motion and neck supple.  Skin:    General: Skin is warm.     Capillary Refill: Capillary refill takes 2 to 3 seconds.     Findings: No rash.  Neurological:     General: No focal deficit present.     Mental Status: He is alert.     Cranial Nerves: No cranial nerve deficit.     Sensory: No sensory deficit.     Motor: No weakness.  Psychiatric:        Mood and Affect: Mood normal.        Thought Content: Thought content normal.     Data Reviewed:  Latest Reference Range & Units 09/24/23 04:39  Sodium 135 - 145 mmol/L  136  Potassium 3.5 - 5.1 mmol/L 3.4 (L)  Chloride 98 - 111 mmol/L 93 (L)  CO2 22 - 32 mmol/L 30  Glucose 70 - 99 mg/dL 161 (H)  BUN 8 - 23 mg/dL 40 (H)  Creatinine 0.96 - 1.24 mg/dL 0.45  Calcium 8.9 - 40.9 mg/dL 8.5 (L)  Anion gap 5 - 15  13  Magnesium  1.7 - 2.4 mg/dL 2.5 (H)  GFR, Estimated >60 mL/min >60  (L): Data is abnormally low (H): Data is abnormally high  Family Communication: updated plan of care with the pt  Disposition: Status is: Inpatient   Planned Discharge Destination:  Awaiting to transfer to [facility]    Time spent: 50 minutes  Author: Allycia Pitz A Willamina Grieshop, MD 09/24/2023 3:23 PM  For on call review [email or web address].

## 2023-09-25 ENCOUNTER — Encounter (HOSPITAL_COMMUNITY): Payer: Self-pay

## 2023-09-25 ENCOUNTER — Inpatient Hospital Stay (HOSPITAL_COMMUNITY)
Admission: EM | Admit: 2023-09-25 | Discharge: 2023-09-30 | DRG: 643 | Disposition: A | Discharge status: Home / Self Care | Payer: Managed Care, Other (non HMO) | Source: Other Acute Inpatient Hospital | Attending: Internal Medicine | Admitting: Internal Medicine

## 2023-09-25 DIAGNOSIS — Z96641 Presence of right artificial hip joint: Secondary | ICD-10-CM | POA: Diagnosis present

## 2023-09-25 DIAGNOSIS — E0591 Thyrotoxicosis, unspecified with thyrotoxic crisis or storm: Secondary | ICD-10-CM | POA: Diagnosis present

## 2023-09-25 DIAGNOSIS — R0602 Shortness of breath: Secondary | ICD-10-CM | POA: Diagnosis present

## 2023-09-25 DIAGNOSIS — I5023 Acute on chronic systolic (congestive) heart failure: Secondary | ICD-10-CM | POA: Diagnosis present

## 2023-09-25 DIAGNOSIS — Z7901 Long term (current) use of anticoagulants: Secondary | ICD-10-CM

## 2023-09-25 DIAGNOSIS — Z85048 Personal history of other malignant neoplasm of rectum, rectosigmoid junction, and anus: Secondary | ICD-10-CM | POA: Diagnosis not present

## 2023-09-25 DIAGNOSIS — I350 Nonrheumatic aortic (valve) stenosis: Secondary | ICD-10-CM | POA: Diagnosis present

## 2023-09-25 DIAGNOSIS — Z7985 Long-term (current) use of injectable non-insulin antidiabetic drugs: Secondary | ICD-10-CM | POA: Diagnosis not present

## 2023-09-25 DIAGNOSIS — E039 Hypothyroidism, unspecified: Secondary | ICD-10-CM | POA: Diagnosis present

## 2023-09-25 DIAGNOSIS — Z885 Allergy status to narcotic agent status: Secondary | ICD-10-CM | POA: Diagnosis not present

## 2023-09-25 DIAGNOSIS — Z9884 Bariatric surgery status: Secondary | ICD-10-CM | POA: Diagnosis not present

## 2023-09-25 DIAGNOSIS — T380X5A Adverse effect of glucocorticoids and synthetic analogues, initial encounter: Secondary | ICD-10-CM | POA: Diagnosis present

## 2023-09-25 DIAGNOSIS — E0581 Other thyrotoxicosis with thyrotoxic crisis or storm: Principal | ICD-10-CM | POA: Diagnosis present

## 2023-09-25 DIAGNOSIS — Z79899 Other long term (current) drug therapy: Secondary | ICD-10-CM | POA: Diagnosis not present

## 2023-09-25 DIAGNOSIS — Z7984 Long term (current) use of oral hypoglycemic drugs: Secondary | ICD-10-CM

## 2023-09-25 DIAGNOSIS — R0789 Other chest pain: Secondary | ICD-10-CM | POA: Diagnosis not present

## 2023-09-25 DIAGNOSIS — E66813 Obesity, class 3: Secondary | ICD-10-CM | POA: Diagnosis present

## 2023-09-25 DIAGNOSIS — K219 Gastro-esophageal reflux disease without esophagitis: Secondary | ICD-10-CM | POA: Diagnosis present

## 2023-09-25 DIAGNOSIS — I110 Hypertensive heart disease with heart failure: Secondary | ICD-10-CM | POA: Diagnosis present

## 2023-09-25 DIAGNOSIS — Z833 Family history of diabetes mellitus: Secondary | ICD-10-CM | POA: Diagnosis not present

## 2023-09-25 DIAGNOSIS — T462X5A Adverse effect of other antidysrhythmic drugs, initial encounter: Secondary | ICD-10-CM | POA: Diagnosis present

## 2023-09-25 DIAGNOSIS — I4819 Other persistent atrial fibrillation: Secondary | ICD-10-CM | POA: Diagnosis present

## 2023-09-25 DIAGNOSIS — I428 Other cardiomyopathies: Secondary | ICD-10-CM | POA: Diagnosis present

## 2023-09-25 DIAGNOSIS — E064 Drug-induced thyroiditis: Secondary | ICD-10-CM | POA: Insufficient documentation

## 2023-09-25 DIAGNOSIS — I447 Left bundle-branch block, unspecified: Secondary | ICD-10-CM | POA: Diagnosis present

## 2023-09-25 DIAGNOSIS — Z6831 Body mass index (BMI) 31.0-31.9, adult: Secondary | ICD-10-CM

## 2023-09-25 DIAGNOSIS — I959 Hypotension, unspecified: Secondary | ICD-10-CM | POA: Diagnosis present

## 2023-09-25 DIAGNOSIS — I5021 Acute systolic (congestive) heart failure: Secondary | ICD-10-CM | POA: Diagnosis not present

## 2023-09-25 DIAGNOSIS — I4891 Unspecified atrial fibrillation: Secondary | ICD-10-CM | POA: Diagnosis not present

## 2023-09-25 DIAGNOSIS — E1165 Type 2 diabetes mellitus with hyperglycemia: Secondary | ICD-10-CM | POA: Diagnosis present

## 2023-09-25 LAB — CBC WITH DIFFERENTIAL/PLATELET
Abs Immature Granulocytes: 0.05 10*3/uL (ref 0.00–0.07)
Basophils Absolute: 0 10*3/uL (ref 0.0–0.1)
Basophils Relative: 0 %
Eosinophils Absolute: 0 10*3/uL (ref 0.0–0.5)
Eosinophils Relative: 0 %
HCT: 39 % (ref 39.0–52.0)
Hemoglobin: 12.7 g/dL — ABNORMAL LOW (ref 13.0–17.0)
Immature Granulocytes: 1 %
Lymphocytes Relative: 22 %
Lymphs Abs: 2.1 10*3/uL (ref 0.7–4.0)
MCH: 27.6 pg (ref 26.0–34.0)
MCHC: 32.6 g/dL (ref 30.0–36.0)
MCV: 84.8 fL (ref 80.0–100.0)
Monocytes Absolute: 1 10*3/uL (ref 0.1–1.0)
Monocytes Relative: 10 %
Neutro Abs: 6.5 10*3/uL (ref 1.7–7.7)
Neutrophils Relative %: 67 %
Platelets: 300 10*3/uL (ref 150–400)
RBC: 4.6 MIL/uL (ref 4.22–5.81)
RDW: 13.3 % (ref 11.5–15.5)
WBC: 9.7 10*3/uL (ref 4.0–10.5)
nRBC: 0 % (ref 0.0–0.2)

## 2023-09-25 LAB — COMPREHENSIVE METABOLIC PANEL
ALT: 19 U/L (ref 0–44)
AST: 21 U/L (ref 15–41)
Albumin: 2.9 g/dL — ABNORMAL LOW (ref 3.5–5.0)
Alkaline Phosphatase: 65 U/L (ref 38–126)
Anion gap: 11 (ref 5–15)
BUN: 53 mg/dL — ABNORMAL HIGH (ref 8–23)
CO2: 27 mmol/L (ref 22–32)
Calcium: 8.3 mg/dL — ABNORMAL LOW (ref 8.9–10.3)
Chloride: 93 mmol/L — ABNORMAL LOW (ref 98–111)
Creatinine, Ser: 1.51 mg/dL — ABNORMAL HIGH (ref 0.61–1.24)
GFR, Estimated: 51 mL/min — ABNORMAL LOW (ref >60)
Glucose, Bld: 307 mg/dL — ABNORMAL HIGH (ref 70–99)
Potassium: 3.8 mmol/L (ref 3.5–5.1)
Sodium: 131 mmol/L — ABNORMAL LOW (ref 135–145)
Total Bilirubin: 0.5 mg/dL (ref 0.0–1.2)
Total Protein: 5.8 g/dL — ABNORMAL LOW (ref 6.5–8.1)

## 2023-09-25 LAB — BASIC METABOLIC PANEL
Anion gap: 16 — ABNORMAL HIGH (ref 5–15)
BUN: 45 mg/dL — ABNORMAL HIGH (ref 8–23)
CO2: 28 mmol/L (ref 22–32)
Calcium: 8.8 mg/dL — ABNORMAL LOW (ref 8.9–10.3)
Chloride: 93 mmol/L — ABNORMAL LOW (ref 98–111)
Creatinine, Ser: 0.99 mg/dL (ref 0.61–1.24)
GFR, Estimated: >60 mL/min (ref >60)
Glucose, Bld: 196 mg/dL — ABNORMAL HIGH (ref 70–99)
Potassium: 3.3 mmol/L — ABNORMAL LOW (ref 3.5–5.1)
Sodium: 137 mmol/L (ref 135–145)

## 2023-09-25 LAB — HEPATIC FUNCTION PANEL
ALT: 24 U/L (ref 0–44)
AST: 25 U/L (ref 15–41)
Albumin: 3.6 g/dL (ref 3.5–5.0)
Alkaline Phosphatase: 76 U/L (ref 38–126)
Bilirubin, Direct: 0.2 mg/dL (ref 0.0–0.2)
Indirect Bilirubin: 0.7 mg/dL (ref 0.3–0.9)
Total Bilirubin: 0.9 mg/dL (ref 0.0–1.2)
Total Protein: 7 g/dL (ref 6.5–8.1)

## 2023-09-25 LAB — GLUCOSE, CAPILLARY
Glucose-Capillary: 212 mg/dL — ABNORMAL HIGH (ref 70–99)
Glucose-Capillary: 232 mg/dL — ABNORMAL HIGH (ref 70–99)
Glucose-Capillary: 216 mg/dL — ABNORMAL HIGH (ref 70–99)
Glucose-Capillary: 356 mg/dL — ABNORMAL HIGH (ref 70–99)
Glucose-Capillary: 222 mg/dL — ABNORMAL HIGH (ref 70–99)

## 2023-09-25 LAB — TSH: TSH: <0.01 u[IU]/mL — ABNORMAL LOW (ref 0.350–4.500)

## 2023-09-25 LAB — T4, FREE: Free T4: 5.37 ng/dL — ABNORMAL HIGH (ref 0.61–1.12)

## 2023-09-25 LAB — MAGNESIUM
Magnesium: 2.8 mg/dL — ABNORMAL HIGH (ref 1.7–2.4)
Magnesium: 2.5 mg/dL — ABNORMAL HIGH (ref 1.7–2.4)

## 2023-09-25 MED ORDER — PROPRANOLOL HCL 10 MG PO TABS
10.0000 mg | ORAL_TABLET | Freq: Three times a day (TID) | ORAL | Status: DC
  Administered 2023-09-25 – 2023-09-30 (×14): 10 mg via ORAL
  Filled 2023-09-25 (×16): qty 1

## 2023-09-25 MED ORDER — ONDANSETRON HCL 4 MG/2ML IJ SOLN: 4.0000 mg | Freq: Four times a day (QID) | INTRAMUSCULAR | Status: DC | PRN

## 2023-09-25 MED ORDER — DIGOXIN 125 MCG PO TABS: 0.1250 mg | ORAL_TABLET | Freq: Every day | ORAL | Status: DC

## 2023-09-25 MED ORDER — APIXABAN 5 MG PO TABS
5.0000 mg | ORAL_TABLET | Freq: Two times a day (BID) | ORAL | Status: DC
  Administered 2023-09-25 – 2023-09-30 (×10): 5 mg via ORAL
  Filled 2023-09-25 (×10): qty 1

## 2023-09-25 MED ORDER — FLUOXETINE HCL 20 MG PO CAPS
40.0000 mg | ORAL_CAPSULE | Freq: Every day | ORAL | Status: AC
Start: 2023-09-26 — End: ?
  Administered 2023-09-26 – 2023-09-30 (×5): 40 mg via ORAL
  Filled 2023-09-25 (×5): qty 2

## 2023-09-25 MED ORDER — INSULIN GLARGINE-YFGN 100 UNIT/ML ~~LOC~~ SOLN: 10.0000 [IU] | Freq: Every day | SUBCUTANEOUS | Status: DC

## 2023-09-25 MED ORDER — SODIUM CHLORIDE 0.9 % IV SOLN: 250.0000 mL | INTRAVENOUS | Status: AC | PRN

## 2023-09-25 MED ORDER — ACETAMINOPHEN 325 MG PO TABS: 650.0000 mg | ORAL_TABLET | ORAL | Status: DC | PRN

## 2023-09-25 MED ORDER — POTASSIUM CHLORIDE CRYS ER 20 MEQ PO TBCR
40.0000 meq | EXTENDED_RELEASE_TABLET | ORAL | Status: DC
Start: 2023-09-25 — End: 2023-09-25
  Administered 2023-09-25 (×2): 40 meq via ORAL
  Filled 2023-09-25 (×2): qty 2

## 2023-09-25 MED ORDER — HYDROCORTISONE SOD SUC (PF) 100 MG IJ SOLR
100.0000 mg | Freq: Three times a day (TID) | INTRAMUSCULAR | Status: DC
  Administered 2023-09-25 – 2023-09-29 (×11): 100 mg via INTRAVENOUS
  Filled 2023-09-25 (×11): qty 2

## 2023-09-25 MED ORDER — INSULIN GLARGINE-YFGN 100 UNIT/ML ~~LOC~~ SOLN
10.0000 [IU] | Freq: Every day | SUBCUTANEOUS | Status: DC
  Filled 2023-09-25: qty 0.1

## 2023-09-25 MED ORDER — FLUOXETINE HCL 20 MG PO CAPS: 40.0000 mg | ORAL_CAPSULE | Freq: Every day | ORAL | Status: DC

## 2023-09-25 MED ORDER — TORSEMIDE 20 MG PO TABS
20.0000 mg | ORAL_TABLET | Freq: Every day | ORAL | Status: DC
  Administered 2023-09-25: 20 mg via ORAL
  Filled 2023-09-25: qty 1

## 2023-09-25 MED ORDER — INSULIN ASPART 100 UNIT/ML IJ SOLN
0.0000 [IU] | Freq: Three times a day (TID) | INTRAMUSCULAR | Status: DC
  Administered 2023-09-26: 11 [IU] via SUBCUTANEOUS
  Administered 2023-09-26: 3 [IU] via SUBCUTANEOUS
  Administered 2023-09-26 – 2023-09-27 (×2): 8 [IU] via SUBCUTANEOUS
  Administered 2023-09-27 – 2023-09-28 (×2): 3 [IU] via SUBCUTANEOUS
  Administered 2023-09-28: 2 [IU] via SUBCUTANEOUS
  Administered 2023-09-28: 3 [IU] via SUBCUTANEOUS
  Administered 2023-09-29: 11 [IU] via SUBCUTANEOUS
  Administered 2023-09-29: 15 [IU] via SUBCUTANEOUS
  Administered 2023-09-30: 5 [IU] via SUBCUTANEOUS

## 2023-09-25 MED ORDER — POTASSIUM IODIDE (EXPECTORANT) 1 GM/ML PO SOLN
250.0000 mg | Freq: Two times a day (BID) | ORAL | Status: DC
  Filled 2023-09-25: qty 0.25

## 2023-09-25 MED ORDER — POTASSIUM IODIDE (EXPECTORANT) 1 GM/ML PO SOLN
250.0000 mg | Freq: Two times a day (BID) | ORAL | Status: DC
  Administered 2023-09-25 – 2023-09-27 (×4): 250 mg via ORAL
  Filled 2023-09-25 (×4): qty 0.25

## 2023-09-25 MED ORDER — IPRATROPIUM BROMIDE 0.06 % NA SOLN
2.0000 | Freq: Four times a day (QID) | NASAL | Status: AC | PRN
Start: 2023-09-25 — End: ?

## 2023-09-25 MED ORDER — PANTOPRAZOLE SODIUM 40 MG PO TBEC: 40.0000 mg | DELAYED_RELEASE_TABLET | Freq: Every day | ORAL | Status: DC

## 2023-09-25 MED ORDER — APIXABAN 5 MG PO TABS: 5.0000 mg | ORAL_TABLET | Freq: Two times a day (BID) | ORAL | Status: DC

## 2023-09-25 MED ORDER — PANTOPRAZOLE SODIUM 40 MG PO TBEC
40.0000 mg | DELAYED_RELEASE_TABLET | Freq: Every day | ORAL | Status: DC
  Administered 2023-09-25 – 2023-09-29 (×5): 40 mg via ORAL
  Filled 2023-09-25 (×5): qty 1

## 2023-09-25 MED ORDER — SODIUM CHLORIDE 0.9% FLUSH
3.0000 mL | Freq: Two times a day (BID) | INTRAVENOUS | Status: DC
  Administered 2023-09-25 – 2023-09-30 (×9): 3 mL via INTRAVENOUS

## 2023-09-25 MED ORDER — POTASSIUM IODIDE (EXPECTORANT) 1 GM/ML PO SOLN: 250.0000 mg | Freq: Two times a day (BID) | ORAL | Status: DC

## 2023-09-25 MED ORDER — HYDROCORTISONE SOD SUC (PF) 100 MG IJ SOLR: 100.0000 mg | Freq: Three times a day (TID) | INTRAMUSCULAR | Status: DC

## 2023-09-25 MED ORDER — TORSEMIDE 20 MG PO TABS: 20.0000 mg | ORAL_TABLET | Freq: Every day | ORAL | Status: DC

## 2023-09-25 MED ORDER — EMPAGLIFLOZIN 25 MG PO TABS: 25.0000 mg | ORAL_TABLET | Freq: Every day | ORAL | Status: DC

## 2023-09-25 MED ORDER — SODIUM CHLORIDE 0.9% FLUSH: 3.0000 mL | INTRAVENOUS | Status: DC | PRN

## 2023-09-25 MED ORDER — PROPRANOLOL HCL 10 MG PO TABS: 10.0000 mg | ORAL_TABLET | Freq: Three times a day (TID) | ORAL | Status: DC

## 2023-09-25 MED ORDER — METHIMAZOLE 5 MG PO TABS
15.0000 mg | ORAL_TABLET | Freq: Three times a day (TID) | ORAL | Status: DC
  Administered 2023-09-25 – 2023-09-30 (×15): 15 mg via ORAL
  Filled 2023-09-25 (×16): qty 1

## 2023-09-25 MED ORDER — INSULIN GLARGINE-YFGN 100 UNIT/ML ~~LOC~~ SOLN
10.0000 [IU] | Freq: Every day | SUBCUTANEOUS | Status: DC
  Administered 2023-09-25 – 2023-09-26 (×2): 10 [IU] via SUBCUTANEOUS
  Filled 2023-09-25 (×3): qty 0.1

## 2023-09-25 MED ORDER — EMPAGLIFLOZIN 25 MG PO TABS
25.0000 mg | ORAL_TABLET | Freq: Every day | ORAL | Status: DC
  Administered 2023-09-26 – 2023-09-30 (×5): 25 mg via ORAL
  Filled 2023-09-25 (×5): qty 1

## 2023-09-25 MED ORDER — BENZONATATE 100 MG PO CAPS
200.0000 mg | ORAL_CAPSULE | Freq: Three times a day (TID) | ORAL | Status: DC
  Administered 2023-09-25 – 2023-09-30 (×14): 200 mg via ORAL
  Filled 2023-09-25 (×15): qty 2

## 2023-09-25 MED ORDER — BENZONATATE 100 MG PO CAPS: 200.0000 mg | ORAL_CAPSULE | Freq: Three times a day (TID) | ORAL | Status: DC

## 2023-09-25 MED ORDER — POTASSIUM IODIDE (EXPECTORANT) 1 GM/ML PO SOLN
250.0000 mg | Freq: Two times a day (BID) | ORAL | Status: DC
  Administered 2023-09-25: 250 mg via ORAL
  Filled 2023-09-25: qty 0.25

## 2023-09-25 MED ORDER — DIGOXIN 125 MCG PO TABS
0.1250 mg | ORAL_TABLET | Freq: Every day | ORAL | Status: DC
  Administered 2023-09-26 – 2023-09-30 (×5): 0.125 mg via ORAL
  Filled 2023-09-25 (×5): qty 1

## 2023-09-25 MED ORDER — METHIMAZOLE 5 MG PO TABS: 15.0000 mg | ORAL_TABLET | Freq: Three times a day (TID) | ORAL | Status: DC

## 2023-09-25 MED ORDER — INSULIN ASPART 100 UNIT/ML IJ SOLN
0.0000 [IU] | Freq: Every day | INTRAMUSCULAR | Status: DC
  Administered 2023-09-25: 5 [IU] via SUBCUTANEOUS
  Administered 2023-09-26: 3 [IU] via SUBCUTANEOUS
  Administered 2023-09-27 – 2023-09-28 (×2): 2 [IU] via SUBCUTANEOUS

## 2023-09-25 MED ORDER — PRAVASTATIN SODIUM 40 MG PO TABS
40.0000 mg | ORAL_TABLET | Freq: Every day | ORAL | Status: DC
  Administered 2023-09-25 – 2023-09-29 (×5): 40 mg via ORAL
  Filled 2023-09-25 (×5): qty 1

## 2023-09-25 NOTE — Discharge Summary (Signed)
Physician Discharge Summary   Patient: William [name]. MRN: [id number] DOB: April 03, 1956  Admit date:     09/17/2023  Discharge date: 09/25/23  Discharge Physician: William [name]   PCP: William Ferrier, MD   Recommendations at discharge:  Please make sure to consult Hospitalist team upon arrival to [facility] ICU - needs medical management other than Cardiology  Discharge Diagnoses: Active Problems:   Acute on chronic HFrEF (heart failure with reduced ejection fraction) (HCC)   Atrial fibrillation with RVR (HCC)   Hyperthyroidism   Cough   Type 2 diabetes mellitus without complication (HCC)   GERD (gastroesophageal reflux disease)   Thyroid storm   Amiodarone-induced thyroiditis  Principal Problem (Resolved):   Acute chest wall pain   Hospital Course:  HPI:  William [name]. is a 68 y.o. male with medical history significant of HFrEF, atrial fibrillation, type 2 diabetes, aortic stenosis, history of gastric bypass, amiodarone induced hyperthyroidism presenting with cough, chest pain. Had been admitted 08/2023 for decompensated heart failure, A-fib with RVR, amiodarone induced hypothyroidism. Has been on methimazole, prednisone taper, Eliquis, metoprolol, Lasix. Patient states he was discharged with a cough that progressively improved but then worsened acutely over the past 1 to 2 weeks. Has had mild nonspecific chest pain over the past few days.    Hospital course / significant events:   01/08: admitted for chest pain r/o ACS, exacerbation HFrEF, Afib w/ RVR - given soft BP have held rate control and diuresing w/ caution. Cardiology consulted - starting IV digoxin and transition to po tomorrow, cautious lasix, GDMT limited by hypotension. Of note, taking methimazole 5 mg tid not 15 mg tid as rx.  01/09: HR still elevated, cardio recs for DCCV planning this for tomorrow  01/10: delay in DCCV - cannot safely sedate given recent mounjaro per anesthesia - at this point given high risk for  general anesthesia planis to wait the standard 7 days from last dose which puts him at 09/23/23 unless he decompensates in the meantime.  01/11-01/12: continuing diuresis, Planning for TEE/DCCV on 1/14 after [name] has been held for 1 week. BP remains low  01/13: runs of limited VT, cardiology aware, pt has no symptoms 01/14: DCCV unsuccessful, planning reattempt   1/15: Cardiology recommended to transfer him to [facility] inpatient Endocrinology for Thyroid Storm. No beds anywhere. 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  Blood pressure is low Cardiology following: Continue digoxin, Eliquis Continue treatment with methimazole: change prednisone to IV Hydrocortisone 100mg  Q8hrs  No further DCCV by cardiology (failed conversion x3) As the blood pressure improves, will increase the dose of Propanolol and or Toprol XL for better HR control Burch-Wartofsky Point Scale (BWPS) for Thyrotoxicosis=50: Highly suggestive of thyroid storm [facility] for transfer to inpatient endocrinology--> no beds at this time--> denied transfer-> Case has been accepted by Dr. [name]- He has contact with an endocrinologist over [facility] that can help guide therapy. If patient needs- Tikosyn load can be done while in 2H.    Hypotension: improving, Soft BP likely from low EF, f/u BP   Intermittent Vtach  Asymptomatic Cardiology team aware Continue telemetry Supplement K and Mg    Acute chest wall  pain - resolved  Nonspecific likely multifactorial in setting of HF, Afib No ACS per cardiology monitor   Acute on chronic HFrEF (heart failure with reduced ejection fraction) 2D echo December 2024 with  a EF of 20 to 25% Cardiology following Continue Lasix Continue beta-blocker, Jardiance, and digoxin Low blood pressure limiting GDMT Needs repeat echo in 3 months to assess for recovery of LVEF and ICD candidacy.     Thyroid storm: Burch-Wartofsky Point Scale (BWPS) for Thyrotoxicosis=50: Highly suggestive of thyroid storm admission December 2024 for amiodarone induced hypothyroidism On methimazole and recent course of prednisone TSH <0.010  Free T4: 5.37 continue methimazole 15mg  TID changed prednisone to IV Hydrocortisone 100mg  Q8hrs  As the blood pressure improves, will increase the dose of Propanolol and or Toprol XL for better HR control [facility] again on 1/16 for transfer to inpatient endocrinology--> no beds at this time--> denied transfer-> recommended to call back tomorrow   Cough, question URI vs amiodarone tox  Noted recurrent cough over multiple weeks with recent evaluation for issues including A-fib with RVR, acute on chronic HFrEF, amiodarone induced hyperthyroidism CT of the chest on admission grossly stable apart from pleural effusions Add as needed DuoNebs PPI Consider pulmonary follow-up if persists given prior amiodarone toxicity with potential for pulmonary impact   GERD (gastroesophageal reflux disease) PPI   Type 2 diabetes mellitus without complication (HCC) Blood sugars 170s SSI  Monitor   Hypokalemia Replace with PO K+ Monitor BMP   Class 1 obesity based on BMI: Body mass index is 32.38 kg/m.  Underweight - under 18  overweight - 25 to 29 obese - 30 or more Class 1 obesity: BMI of 30.0 to 34 Class 2 obesity: BMI of 35.0 to 39 Class 3 obesity: BMI of 40.0 to 49 Super Morbid Obesity: BMI 50-59 Super-super Morbid Obesity: BMI 60+ Significantly low or high BMI is associated with higher medical risk.  Weight management advised as adjunct to other disease management and risk reduction treatments    Consultants:  Cardiology  [facility])  Procedures/Surgeries: none   DVT prophylaxis: eliquis IV fluids: no continuous IV fluids  Nutrition: cardaic diet Central lines / invasive devices: noen  Code Status: FULL CODE ACP documentation reviewed:  none on file in [facility]  TOC needs: TBD but expect d/c back home with no home health needs  Barriers to dispo / significant pending items: DCCV per cardiology        Procedures performed: DCCV  Disposition:  to [facility]  Diet recommendation:  Cardiac diet  CURRENT FACILTY MEDICATION:    Current Facility-Administered Medications (Endocrine & Metabolic):    empagliflozin (JARDIANCE) tablet 25 mg   hydrocortisone sodium succinate (SOLU-CORTEF) 100 MG injection 100 mg   insulin aspart (novoLOG) injection 0-15 Units   insulin glargine-yfgn (SEMGLEE) injection 10 Units   methimazole (TAPAZOLE) tablet 15 mg   Facility-Administered Medications Ordered in Other Encounters (Endocrine & Metabolic):    [START ON 09/26/2023] empagliflozin (JARDIANCE) tablet 25 mg   [START ON 09/26/2023] hydrocortisone sodium succinate (SOLU-CORTEF) 100 MG injection 100 mg   insulin glargine-yfgn (SEMGLEE) injection 10 Units   methimazole (TAPAZOLE) tablet 15 mg  Current Facility-Administered Medications (Cardiovascular):    digoxin (LANOXIN) tablet 0.125 mg   pravastatin (PRAVACHOL) tablet 40 mg   propranolol (INDERAL) tablet 10 mg   torsemide (DEMADEX) tablet 20 mg   Facility-Administered Medications Ordered in Other Encounters (Cardiovascular):    [START ON 09/26/2023] digoxin (LANOXIN) tablet 0.125 mg  pravastatin (PRAVACHOL) tablet 40 mg   propranolol (INDERAL) tablet 10 mg   [START ON 09/26/2023] torsemide (DEMADEX) tablet 20 mg  Current Facility-Administered Medications (Respiratory):    benzonatate (TESSALON) capsule 200 mg   guaiFENesin-dextromethorphan (ROBITUSSIN DM) 100-10 MG/5ML syrup 5 mL   ipratropium (ATROVENT) 0.06 % nasal spray 2 spray   levalbuterol  (XOPENEX) nebulizer solution 0.63 mg   potassium iodide (SSKI) 1 GM/ML solution 250 mg   Facility-Administered Medications Ordered in Other Encounters (Respiratory):    benzonatate (TESSALON) capsule 200 mg   ipratropium (ATROVENT) 0.06 % nasal spray 2 spray   potassium iodide (SSKI) 1 GM/ML solution 250 mg    Facility-Administered Medications Ordered in Other Encounters (Analgesics):    acetaminophen (TYLENOL) tablet 650 mg  Current Facility-Administered Medications (Hematological):    apixaban (ELIQUIS) tablet 5 mg   Facility-Administered Medications Ordered in Other Encounters (Hematological):    apixaban (ELIQUIS) tablet 5 mg  Current Facility-Administered Medications (Other):    FLUoxetine (PROZAC) capsule 40 mg   lidocaine-prilocaine (EMLA) cream   menthol-cetylpyridinium (CEPACOL) lozenge 3 mg   ondansetron (ZOFRAN) tablet 4 mg **OR** ondansetron (ZOFRAN) injection 4 mg   pantoprazole (PROTONIX) EC tablet 40 mg   potassium chloride SA (KLOR-CON M) CR tablet 40 mEq   sodium chloride 0.9 % bolus 500 mL   zolpidem (AMBIEN) tablet 5 mg   Facility-Administered Medications Ordered in Other Encounters (Other):    0.9 %  sodium chloride infusion   [START ON 09/26/2023] FLUoxetine (PROZAC) capsule 40 mg   ondansetron (ZOFRAN) injection 4 mg   pantoprazole (PROTONIX) EC tablet 40 mg   sodium chloride flush (NS) 0.9 % injection 3 mL   sodium chloride flush (NS) 0.9 % injection 3 mL No current outpatient medications on file.     Follow-up Information     [facility]. Go in 6 day(s).   Specialty: Cardiology Why: Hospital Follow-Up 09/30/23 @ 11:00 with Inetta Fermo Please bring all medications to follow-up appointment [facility], [address] Floor Free Valet Parking @ the door Contact information: [address] [place] [address] [phone]               Discharge Exam: Ceasar Mons  Weights   09/23/23 0510 09/24/23 0555 09/25/23 0500  Weight: 105.8 kg 104.5 kg 106 kg   Constitutional:      General: He is not in acute distress.    Appearance: He is not ill-appearing.  HENT:     Head: Normocephalic and atraumatic.     Nose: Nose normal. No congestion.     Mouth/Throat:     Mouth: Mucous membranes are moist.     Pharynx: Oropharynx is clear. No oropharyngeal exudate.  Eyes:     Extraocular Movements: Extraocular movements intact.     Conjunctiva/sclera: Conjunctivae normal.  Cardiovascular:     Rate and Rhythm: Tachycardia present. Rhythm irregular.  Pulmonary:     Effort: Pulmonary effort is normal.     Breath sounds: Normal breath sounds.  Abdominal:     General: Abdomen is flat. Bowel sounds are normal.     Palpations: Abdomen is soft.  Musculoskeletal:        General: No tenderness. Normal range of motion.     Cervical back: Normal range of motion and neck supple.     Comments: Trace edema in LEs b/l  Skin:    General: Skin is warm.     Capillary Refill: Capillary refill  takes 2 to 3 seconds.     Findings: No rash.  Neurological:     General: No focal deficit present.     Mental Status: He is alert.     Cranial Nerves: No cranial nerve deficit.     Sensory: No sensory deficit.     Motor: No weakness.  Psychiatric:        Mood and Affect: Mood normal.        Thought Content: Thought content normal.     Condition at discharge: stable  The results of significant diagnostics from this hospitalization (including imaging, microbiology, ancillary and laboratory) are listed below for reference.   Imaging Studies: CT Angio Chest/Abd/Pel for Dissection W and/or Wo Contrast Result Date: 09/17/2023 CLINICAL DATA:  Chest pain. EXAM: CT ANGIOGRAPHY CHEST, ABDOMEN AND PELVIS TECHNIQUE: Non-contrast CT of the chest was initially obtained. Multidetector CT imaging through the chest, abdomen and pelvis was performed using the standard protocol during bolus  administration of intravenous contrast. Multiplanar reconstructed images and MIPs were obtained and reviewed to evaluate the vascular anatomy. RADIATION DOSE REDUCTION: This exam was performed according to the departmental dose-optimization program which includes automated exposure control, adjustment of the mA and/or kV according to patient size and/or use of iterative reconstruction technique. CONTRAST:  OMNIPAQUE IOHEXOL 350 MG/ML SOLN COMPARISON:  August 20, 2023. FINDINGS: CTA CHEST FINDINGS Cardiovascular: Preferential opacification of the thoracic aorta. No evidence of thoracic aortic aneurysm or dissection. Normal heart size. No pericardial effusion. Aortic valve calcifications are noted. Coronary artery calcifications are noted. Mediastinum/Nodes: No enlarged mediastinal, hilar, or axillary lymph nodes. Thyroid gland, trachea, and esophagus demonstrate no significant findings. Lungs/Pleura: Small bilateral pleural effusions are noted with minimal adjacent subsegmental atelectasis. No pneumothorax is noted. Musculoskeletal: No chest wall abnormality. No acute or significant osseous findings. Review of the MIP images confirms the above findings. CTA ABDOMEN AND PELVIS FINDINGS VASCULAR Aorta: Atherosclerosis of abdominal aorta is noted without aneurysm or dissection. Celiac: Patent without evidence of aneurysm, dissection, vasculitis or significant stenosis. SMA: Patent without evidence of aneurysm, dissection, vasculitis or significant stenosis. Renals: Both renal arteries are patent without evidence of aneurysm, dissection, vasculitis, fibromuscular dysplasia or significant stenosis. IMA: Patent without evidence of aneurysm, dissection, vasculitis or significant stenosis. Inflow: Patent without evidence of aneurysm, dissection, vasculitis or significant stenosis. Veins: No obvious venous abnormality within the limitations of this arterial phase study. Review of the MIP images confirms the above  findings. NON-VASCULAR Hepatobiliary: No focal liver abnormality is seen. No gallstones, gallbladder wall thickening, or biliary dilatation. Pancreas: Fatty replacement of the pancreas is noted. Spleen: Normal in size without focal abnormality. Adrenals/Urinary Tract: Adrenal glands are unremarkable. Large calcified renal cyst is noted in upper pole which is unchanged. Smaller cyst is seen more inferiorly in right kidney. No follow-up is required for these. No hydronephrosis or renal obstruction. Urinary bladder is unremarkable. Stomach/Bowel: Status post gastric bypass. Appendix appears normal. No evidence of bowel obstruction or inflammation. Lymphatic: No adenopathy. Reproductive: Prostate is unremarkable. Other: Small fat containing periumbilical hernia.  No ascites. Musculoskeletal: Status post right total hip arthroplasty. No acute osseous abnormality is noted. Review of the MIP images confirms the above findings. IMPRESSION: No evidence of thoracic or abdominal aortic dissection or aneurysm. Small pleural effusions are noted. Coronary artery calcifications are noted. Aortic Atherosclerosis (ICD10-I70.0). Electronically Signed   By: Lupita Raider M.D.   On: 09/17/2023 08:41   DG Chest 2 View Result Date: 09/17/2023 CLINICAL DATA:  Chest pains  and coughing. EXAM: CHEST - 2 VIEW COMPARISON:  Portable chest 08/20/2023 FINDINGS: 5:05 a.m. There is mild cardiomegaly. There is central vascular prominence and flow cephalization and increased mild generalized interstitial consolidation consistent with edema, with small pleural effusions beginning to blunt the sulci. No alveolar infiltrate is seen. The mediastinum is normally outlined. Interval right PICC removal. Thoracic cage is intact. IMPRESSION: Findings consistent with CHF and mild interstitial edema. Small pleural effusions. Electronically Signed   By: Almira Bar M.D.   On: 09/17/2023 06:13    Microbiology: Results for orders placed or performed  during the hospital encounter of 09/17/23  Resp panel by RT-PCR (RSV, Flu A&B, Covid) Anterior Nasal Swab     Status: None   Collection Time: 09/17/23  5:55 AM   Specimen: Anterior Nasal Swab  Result Value Ref Range Status   SARS Coronavirus 2 by RT PCR NEGATIVE NEGATIVE Final    Comment: (NOTE) SARS-CoV-2 target nucleic acids are NOT DETECTED.  The SARS-CoV-2 RNA is generally detectable in upper respiratory specimens during the acute phase of infection. The lowest concentration of SARS-CoV-2 viral copies this assay can detect is 138 copies/mL. A negative result does not preclude SARS-Cov-2 infection and should not be used as the sole basis for treatment or other patient management decisions. A negative result may occur with  improper specimen collection/handling, submission of specimen other than nasopharyngeal swab, presence of viral mutation(s) within the areas targeted by this assay, and inadequate number of viral copies(<138 copies/mL). A negative result must be combined with clinical observations, patient history, and epidemiological information. The expected result is Negative.  Fact Sheet for Patients:  BloggerCourse.com  Fact Sheet for Healthcare Providers:  SeriousBroker.it  This test is no t yet approved or cleared by the [place] FDA and  has been authorized for detection and/or diagnosis of SARS-CoV-2 by FDA under an Emergency Use Authorization (EUA). This EUA will remain  in effect (meaning this test can be used) for the duration of the COVID-19 declaration under Section 564(b)(1) of the Act, 21 U.S.C.section 360bbb-3(b)(1), unless the authorization is terminated  or revoked sooner.       Influenza A by PCR NEGATIVE NEGATIVE Final   Influenza B by PCR NEGATIVE NEGATIVE Final    Comment: (NOTE) The Xpert Xpress SARS-CoV-2/FLU/RSV plus assay is intended as an aid in the diagnosis of influenza from  Nasopharyngeal swab specimens and should not be used as a sole basis for treatment. Nasal washings and aspirates are unacceptable for Xpert Xpress SARS-CoV-2/FLU/RSV testing.  Fact Sheet for Patients: BloggerCourse.com  Fact Sheet for Healthcare Providers: SeriousBroker.it  This test is not yet approved or cleared by the [place] FDA and has been authorized for detection and/or diagnosis of SARS-CoV-2 by FDA under an Emergency Use Authorization (EUA). This EUA will remain in effect (meaning this test can be used) for the duration of the COVID-19 declaration under Section 564(b)(1) of the Act, 21 U.S.C. section 360bbb-3(b)(1), unless the authorization is terminated or revoked.     Resp Syncytial Virus by PCR NEGATIVE NEGATIVE Final    Comment: (NOTE) Fact Sheet for Patients: BloggerCourse.com  Fact Sheet for Healthcare Providers: SeriousBroker.it  This test is not yet approved or cleared by the [place] FDA and has been authorized for detection and/or diagnosis of SARS-CoV-2 by FDA under an Emergency Use Authorization (EUA). This EUA will remain in effect (meaning this test can be used) for the duration of the COVID-19 declaration under Section 564(b)(1) of the Act,  21 U.S.C. section 360bbb-3(b)(1), unless the authorization is terminated or revoked.  Performed at [facility], [address] [place], [place] [address]   Respiratory (~20 pathogens) panel by PCR     Status: None   Collection Time: 09/19/23  6:50 PM   Specimen: Nasopharyngeal Swab; Respiratory  Result Value Ref Range Status   Adenovirus NOT DETECTED NOT DETECTED Final   Coronavirus 229E NOT DETECTED NOT DETECTED Final    Comment: (NOTE) The Coronavirus on the Respiratory Panel, DOES NOT test for the novel  Coronavirus (2019 nCoV)    Coronavirus HKU1 NOT DETECTED NOT DETECTED Final    Coronavirus NL63 NOT DETECTED NOT DETECTED Final   Coronavirus OC43 NOT DETECTED NOT DETECTED Final   Metapneumovirus NOT DETECTED NOT DETECTED Final   Rhinovirus / Enterovirus NOT DETECTED NOT DETECTED Final   Influenza A NOT DETECTED NOT DETECTED Final   Influenza B NOT DETECTED NOT DETECTED Final   Parainfluenza Virus 1 NOT DETECTED NOT DETECTED Final   Parainfluenza Virus 2 NOT DETECTED NOT DETECTED Final   Parainfluenza Virus 3 NOT DETECTED NOT DETECTED Final   Parainfluenza Virus 4 NOT DETECTED NOT DETECTED Final   Respiratory Syncytial Virus NOT DETECTED NOT DETECTED Final   Bordetella pertussis NOT DETECTED NOT DETECTED Final   Bordetella Parapertussis NOT DETECTED NOT DETECTED Final   Chlamydophila pneumoniae NOT DETECTED NOT DETECTED Final   Mycoplasma pneumoniae NOT DETECTED NOT DETECTED Final    Comment: Performed at [facility], [address] [place], [place] [address]    Labs: CBC: Recent Labs  Lab 09/19/23 0303 09/20/23 0603 09/21/23 0503 09/22/23 0522 09/23/23 0436  WBC 9.7 10.7* 9.8 7.9 9.2  HGB 11.7* 13.0 13.9 12.4* 12.4*  HCT 36.8* 39.2 41.6 37.1* 37.4*  MCV 88.7 84.8 83.9 83.7 84.8  PLT 176 251 288 234 257   Basic Metabolic Panel: Recent Labs  Lab 09/21/23 0954 09/22/23 0522 09/22/23 1313 09/23/23 0436 09/24/23 0439 09/25/23 0649  NA  --  136 137 136 136 137  K  --  2.9* 3.6 3.1* 3.4* 3.3*  CL  --  96* 94* 94* 93* 93*  CO2  --  26 29 31 30 28   GLUCOSE  --  271* 240* 145* 241* 196*  BUN  --  40* 40* 39* 40* 45*  CREATININE  --  1.04 1.02 0.93 1.03 0.99  CALCIUM  --  8.3* 8.7* 8.5* 8.5* 8.8*  MG 2.1  --   --  2.5* 2.5* 2.8*   Liver Function Tests: Recent Labs  Lab 09/25/23 1139  AST 25  ALT 24  ALKPHOS 76  BILITOT 0.9  PROT 7.0  ALBUMIN 3.6   CBG: Recent Labs  Lab 09/24/23 1158 09/24/23 1706 09/24/23 2104 09/25/23 0915 09/25/23 1220  GLUCAP 210* 293* 316* 212* 232*    Discharge time spent: greater than 30  minutes.  Signed: Ernestene Mention, MD [facility] 09/25/2023

## 2023-09-25 NOTE — H&P (Addendum)
Advanced Heart Failure Team History and Physical Note   PCP:  Lynnea Ferrier, MD  PCP-Cardiology: Julien Nordmann, MD     Reason for Admission: Atrial fibrillation with RVR, acute on chronic CHF   HPI:   68 y.o. with history of chronic systolic CHF, atrial fibrillation, hyperthyroidism, DM II, aortic valve stenosis.    Back in 2021, after getting a COVID vaccination, patient went into atrial fibrillation with RVR.  Echo in 3/21 showed EF < 20%.  He underwent DCCV back to NSR and was started on amiodarone.  LV function made some improvement over time, in 4/24, echo showed EF 40-45% with moderate AS.  Patient did well symptomatically until 12/24.  At that time, he was admitted with CHF and atrial fibrillation with RVR in the setting of hyperthyroidism.  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  Thyroid stimulating antibodies were negative.  Echo in 12/24 showed EF 20-25% with normal RV, mild-moderate MR, moderate AS with mean gradient 16 mmHg. He was started on methimazole and prednisone and converted to NSR spontaneously.   He was discharged home, but only got methimazole 5 mg tid from the pharmacy rather than the ordered 15 mg tid and never got prednisone.     He was readmitted to [facility] 09/17/23 for reoccurrence of atrial fibrillation with RVR and a/c CHF in setting of thyroid storm.  He was started on higher dose of methimazole, steroids and propanolol. Case discussed with endocrine and iodine drops later added on 01/16. Diuresed with IV lasix. Transferred to [facility] for higher level of care.   Home Medications Prior to Admission medications   Medication Sig Start Date End Date Taking? Authorizing Provider  albuterol (VENTOLIN HFA) 108 (90 Base) MCG/ACT inhaler Inhale 2 puffs into the lungs every 6 (six) hours as needed for wheezing. 08/27/23   [provider]  amoxicillin (AMOXIL) 500 MG capsule Take 2,000 mg by mouth once as needed (Pretreatment for  Procedure(s)). Patient not taking: Reported on 09/17/2023 06/12/23   [provider]  betamethasone valerate (VALISONE) 0.1 % cream Apply 1 Application topically 2 (two) times daily as needed. Patient not taking: Reported on 09/17/2023 04/11/23   [provider]  Cyanocobalamin (B-12) 2500 MCG TABS Take 5,000 mcg by mouth daily.     [provider]  ELIQUIS 5 MG TABS tablet TAKE 1 TABLET BY MOUTH TWICE  DAILY 02/11/23   Clarisa Kindred A, FNP  FLUoxetine (PROZAC) 40 MG capsule Take 1 capsule by mouth daily. 05/23/23   [provider]  furosemide (LASIX) 40 MG tablet Take 0.5 tablets (20 mg total) by mouth daily. 08/24/23   Lurene Shadow, MD  ipratropium (ATROVENT) 0.03 % nasal spray Place 2 sprays into the nose 2 (two) times daily. 08/14/23 08/28/23  [provider]  JARDIANCE 25 MG TABS tablet Take 25 mg by mouth daily. 11/02/19   [provider]  metFORMIN (GLUCOPHAGE) 1000 MG tablet Take 1,000 mg by mouth 2 (two) times daily with a meal.     [provider]  methimazole (TAPAZOLE) 5 MG tablet Take 3 tablets (15 mg total) by mouth 3 (three) times daily. 08/24/23   Lurene Shadow, MD  metoprolol succinate (TOPROL-XL) 50 MG 24 hr tablet Take 1 tablet (50 mg total) by mouth daily. Take with or immediately following a meal. 08/24/23   Lurene Shadow, MD  Multiple Vitamin (MULTIVITAMIN WITH MINERALS) TABS tablet Take 1 tablet by mouth daily.    [provider]  pantoprazole (PROTONIX) 40 MG tablet Take 40 mg by mouth every evening.     [provider]  potassium chloride SA (KLOR-CON M) 20 MEQ tablet Take 1 tablet (20 mEq total) by mouth daily for 5 days. 08/24/23 08/29/23  Lurene Shadow, MD  pravastatin (PRAVACHOL) 40 MG tablet Take 40 mg by mouth at bedtime.  09/22/19   [provider]  tirzepatide [name]) 12.5 MG/0.5ML Pen Inject 12.5 mg into the skin once a week. 05/16/23   [provider]  traZODone (DESYREL)  50 MG tablet Take 1 tablet (50 mg total) by mouth at bedtime. Patient not taking: Reported on 09/17/2023 08/24/23   Lurene Shadow, MD  triamcinolone cream (KENALOG) 0.1 % Apply 1 Application topically 2 (two) times daily as needed. 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   CHF (congestive heart failure) (HCC)    Diabetes mellitus without complication (HCC)    GERD (gastroesophageal reflux disease)    Hypertension    Iron deficiency anemia    Obesity    Osteoarthritis of hip    right   Peptic ulcer disease     Past Surgical History: Past Surgical History:  Procedure Laterality Date   CARDIOVERSION N/A 11/26/2019   Procedure: CARDIOVERSION;  Surgeon: Dalia Heading, MD;  Location: [facility] ORS;  Service: Cardiovascular;  Laterality: N/A;   COLONOSCOPY     ESOPHAGOGASTRODUODENOSCOPY     GASTRIC BYPASS     2009   NASAL SINUS SURGERY     TEE WITHOUT CARDIOVERSION N/A 09/23/2023   Procedure: TRANSESOPHAGEAL ECHOCARDIOGRAM (TEE);  Surgeon: Laurey Morale, MD;  Location: [facility] ORS;  Service: Cardiovascular;  Laterality: N/A;  TEE with cardioversion   TOTAL HIP ARTHROPLASTY Right 08/20/2016   Procedure: TOTAL HIP ARTHROPLASTY ANTERIOR APPROACH;  Surgeon: Kennedy Bucker, MD;  Location: [facility] ORS;  Service: Orthopedics;  Laterality: Right;    Family History:  Family History  Problem Relation Age of Onset   Dementia Mother    Diabetes Father     Social History: Social History   Socioeconomic History   Marital status: Single    Spouse name: Not on file   Number of children: Not on file   Years of education: Not on file   Highest education level: Not on file  Occupational History   Not on file  Tobacco Use   Smoking status: Never   Smokeless tobacco: Never  Vaping Use   Vaping status: Never Used  Substance and Sexual Activity    Alcohol use: No   Drug use: No   Sexual activity: Not Currently  Other Topics Concern   Not on file  Social History Narrative   Not on file   Social Drivers of Health   Financial Resource Strain: Low Risk  (05/16/2023)   Received from [facility]   Overall Financial Resource Strain (CARDIA)    Difficulty of Paying Living Expenses: Not hard at all  Food Insecurity: No Food Insecurity (09/18/2023)   Hunger Vital Sign    Worried About Running Out of Food in the Last Year: Never true    Ran Out of Food in the Last Year: Never true  Transportation Needs: No Transportation Needs (09/18/2023)   PRAPARE - Transportation    Lack of Transportation (Medical): No    Lack  of Transportation (Non-Medical): No  Physical Activity: Not on file  Stress: Stress Concern Present (12/03/2019)   Received from [facility]   [employer] of Occupational Health - Occupational Stress Questionnaire    Feeling of Stress : To some extent  Social Connections: Moderately Integrated (09/18/2023)   Social Connection and Isolation Panel [NHANES]    Frequency of Communication with Friends and Family: More than three times a week    Frequency of Social Gatherings with Friends and Family: Twice a week    Attends Religious Services: 1 to 4 times per year    Active Member of [employer] or Organizations: Yes    Attends Banker Meetings: 1 to 4 times per year    Marital Status: Never married    Allergies:  Allergies  Allergen Reactions   Morphine And Codeine Hives   Nsaids Other (See Comments)    Gastric bypass    Objective:    Vital Signs:   Temp:  [97.8 F (36.6 C)-98.5 F (36.9 C)] 97.8 F (36.6 C) (01/16 1531) Pulse Rate:  [107-140] 107 (01/16 1633) Resp:  [16-20] 17 (01/16 1633) BP: (88-107)/(69-88) 107/84 (01/16 1633) SpO2:  [92 %-98 %] 92 % (01/16 1633) Weight:  [102.3 kg-106 kg] 102.3 kg (01/16 1633) Last BM Date :  09/24/23 Filed Weights   09/25/23 1633  Weight: 102.3 kg     Physical Exam     General:  Well appearing.  HEENT: Normal Neck: Supple. no JVD.  Cor: PMI nondisplaced. Irregular rhythm tachy. Lungs: Breathing nonlabored Extremities: No cyanosis, clubbing, rash, edema Neuro: Alert & oriented x 3. Affect pleasant.   Telemetry   Afib 120s   Labs     Basic Metabolic Panel: Recent Labs  Lab 09/21/23 0954 09/22/23 0522 09/22/23 1313 09/23/23 0436 09/24/23 0439 09/25/23 0649  NA  --  136 137 136 136 137  K  --  2.9* 3.6 3.1* 3.4* 3.3*  CL  --  96* 94* 94* 93* 93*  CO2  --  26 29 31 30 28   GLUCOSE  --  271* 240* 145* 241* 196*  BUN  --  40* 40* 39* 40* 45*  CREATININE  --  1.04 1.02 0.93 1.03 0.99  CALCIUM  --  8.3* 8.7* 8.5* 8.5* 8.8*  MG 2.1  --   --  2.5* 2.5* 2.8*    Liver Function Tests: Recent Labs  Lab 09/25/23 1139  AST 25  ALT 24  ALKPHOS 76  BILITOT 0.9  PROT 7.0  ALBUMIN 3.6   No results for input(s): "LIPASE", "AMYLASE" in the last 168 hours. No results for input(s): "AMMONIA" in the last 168 hours.  CBC: Recent Labs  Lab 09/19/23 0303 09/20/23 0603 09/21/23 0503 09/22/23 0522 09/23/23 0436  WBC 9.7 10.7* 9.8 7.9 9.2  HGB 11.7* 13.0 13.9 12.4* 12.4*  HCT 36.8* 39.2 41.6 37.1* 37.4*  MCV 88.7 84.8 83.9 83.7 84.8  PLT 176 251 288 234 257    Cardiac Enzymes: No results for input(s): "CKTOTAL", "CKMB", "CKMBINDEX", "TROPONINI" in the last 168 hours.  BNP: BNP (last 3 results) Recent Labs    08/19/23 2232 09/17/23 0444  BNP 1,995.5* 2,499.1*    ProBNP (last 3 results) No results for input(s): "PROBNP" in the last 8760 hours.   CBG: Recent Labs  Lab 09/24/23 1706 09/24/23 2104 09/25/23 0915 09/25/23 1220 09/25/23 1655  GLUCAP 293* 316* 212* 232* 216*    Coagulation Studies: No results for  input(s): "LABPROT", "INR" in the last 72 hours.  Imaging: No results found.   Patient Profile  68 y.o. male with history of  PAF, chronic systolic CHF, hyperthyroidism, DM II, AS.  Admitted with Afib with RVR and acute on chronic CHF in setting of thyroid storm.  Assessment/Plan  1. 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  He was taking a lower dose of methimazole than prescribed and also did not get prednisone at home.  He had prior episode of AF in 2021, had been on amiodarone since that time.  Amiodarone was stopped in 12/24 with type 2 AIT.  Today, HR in 120s-130s.  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  Needs to get back into NSR though antiarrhythmic options are limited.  Would avoid amiodarone use unless absolutely necessary, and with persistently long QTc,  do not think that he will be able to take Tikosyn.  - Continue digoxin for rate control.  - Continue propranolol 10 mg TID - Continue Eliquis 5 mg bid.  - Continue hyperthyroidism treatment with methimazole and prednisone.   - TEE/DCCV 1/14 failed conversion x3 - Ultimately, he should be evaluated for AF ablation.  - EP consulted 09/24/23, recommended transfer to [facility] for inpatient endocrinology consult. No beds available.  2. 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 Thyroid stimulating antibodies were negative in 12/24, less likely type 1. He was taking lower than prescribed dose of methimazole at home and never got prednisone after last discharge.  - Methimazole 15 mg tid.  - Prednisone restarted 1/15 for type II AIT, switched to solu-cortef per primary team - Continue propranolol 10 mg TID, denies dizziness. May be able to increase to 20 TID - Dr. [name] discussed with local Endocrinology. Added potassium iodide drops 250 mg BID on 01/16.  3. Acute on chronic systolic CHF: Presumed nonischemic cardiomyopathy, ?tachycardia-mediated with AF/RVR.  Echo in 3/21 with EF < 20%.  He was in atrial fibrillation with RVR at that time, converted to NSR on  amiodarone.  Slow improvement in EF over time, echo in 4/24 with EF 40-45%, moderate aortic stenosis.  Recurrent AF/RVR in 12/24, echo with EF 20-25% with normal RV, mild-moderate MR, moderate AS with mean gradient 16 mmHg. Suspect fall in LV systolic function was tachycardia-mediated. He remains in AF/RVR.   - Volume stable. IV lasix switched to po Torsemide 20 mg daily - Continue digoxin 0.125. Dig level 0.7 09/21/23 (dose cut back) - Continue propranolol 10 mg TID - Continue Jardiance.  - Conversion back to NSR difficult with hyperthyroid.  - If EF does not improve significantly in NSR, consider right/left heart cath in future.  4. Aortic stenosis: Moderate on echo this admission.  5. DM2: Watch glucose with steroid use.     FINCH, LINDSAY N, PA-C 09/25/2023, 5:51 PM  Advanced Heart Failure Team Pager [phone] (M-F; 7a - 5p)  Please contact [facility] for night-coverage after hours (4p -7a ) and weekends on [email or web address]  Agree with above.    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 EF 20-25% by echo. Failed DC-CV x3.  Therapy complicated by prolonged QT.    Remains in AF with rates in 120s Denies CP or SOB. Volume status better. BP remains marginal.   General:  Sitting up HEENT: normal Neck: supple. no JVD. Carotids 2+ bilat; no bruits. No lymphadenopathy or thryomegaly appreciated. Cor: PMI nondisplaced. Irregular tachy Lungs: clear  Abdomen: soft, nontender, nondistended. No hepatosplenomegaly. No bruits or masses. Good bowel sounds. Extremities: no cyanosis, clubbing, rash, tr edema Neuro: alert & orientedx3, cranial nerves grossly intact. moves all 4 extremities w/o difficulty. Affect pleasant   Clinically improved but still quite tenuous in setting of thyroid storm. I have spoken with Dr. Marylouise Stacks (Endo). Will continue steroids, methimazone and propanolol. Add iodine drops for faster effect. Have moved to [facility] ICU for  closer monitoring.    I have also spoken to Dr. Lalla Brothers about potential for Tikosyn. QT is long but mainly driven by LBBB (JT interval OK). He feels he is currently too high risk for Tikosyn. Will continue treatment of thyroid storm and attempt repeat DC-CV once more stable.    CRITICAL CARE Performed by: Arvilla Meres   Total critical care time: 55 minutes   Critical care time was exclusive of separately billable procedures and treating other patients.   Critical care was necessary to treat or prevent imminent or life-threatening deterioration.   Critical care was time spent personally by me (independent of midlevel providers or residents) on the following activities: development of treatment plan with patient and/or surrogate as well as nursing, discussions with consultants, evaluation of patient's response to treatment, examination of patient, obtaining history from patient or surrogate, ordering and performing treatments and interventions, ordering and review of laboratory studies, ordering and review of radiographic studies, pulse oximetry and re-evaluation of patient's condition.   Arvilla Meres, MD  4:26 PM

## 2023-09-25 NOTE — Inpatient Diabetes Management (Signed)
Inpatient Diabetes Program Recommendations  AACE/ADA: New Consensus Statement on Inpatient Glycemic Control (2015)  Target Ranges:  Prepandial:   less than 140 mg/dL      Peak postprandial:   less than 180 mg/dL (1-2 hours)      Critically ill patients:  140 - 180 mg/dL   Lab Results  Component Value Date   GLUCAP 232 (H) 09/25/2023   HGBA1C 6.1 (H) 08/20/2023    Review of Glycemic Control  Latest Reference Range & Units 09/24/23 07:44 09/24/23 11:58 09/24/23 17:06 09/24/23 21:04 09/25/23 09:15 09/25/23 12:20  Glucose-Capillary 70 - 99 mg/dL 161 (H) 096 (H) 045 (H) 316 (H) 212 (H) 232 (H)   Diabetes history: DM 2 Outpatient Diabetes medications:  Jardiance 25 mg daily Metformin 1000 mg bid Mounjaro 12.5 mg weekly Current orders for Inpatient glycemic control:  Novolog 0-15 units tid with meals and HS Jardiance 25 mg daily Solucortef 100 mg q 8 hours Inpatient Diabetes Program Recommendations:    While on steroids, consider adding Semglee 12 units daily.   Thanks,  Lorenza Cambridge, RN, BC-ADM Inpatient Diabetes Coordinator Pager [phone]  (8a-5p)

## 2023-09-25 NOTE — Progress Notes (Addendum)
Patient ID: William Dy., male   DOB: Feb 04, 1956, 68 y.o.   MRN: [id number]     Advanced Heart Failure Rounding Note  Cardiologist: Julien Nordmann, MD  Chief Complaint: Shortness of breath Subjective:   Failed DCCV 1/14 x3. TEE read pending  Feels fine this morning. Asking about transfer to [place]. Denies dizziness.   Objective:   Weight Range: 106 kg Body mass index is 32.59 kg/m.   Vital Signs:   Temp:  [98 F (36.7 C)-98.7 F (37.1 C)] 98.2 F (36.8 C) (01/16 0012) Pulse Rate:  [100-128] 128 (01/16 0012) Resp:  [17-20] 20 (01/16 0012) BP: (73-101)/(44-88) 101/88 (01/16 0012) SpO2:  [94 %-98 %] 94 % (01/16 0012) Weight:  [106 kg] 106 kg (01/16 0500) Last BM Date : 09/24/23  Weight change: Filed Weights   09/23/23 0510 09/24/23 0555 09/25/23 0500  Weight: 105.8 kg 104.5 kg 106 kg   Intake/Output:   Intake/Output Summary (Last 24 hours) at 09/25/2023 0739 Last data filed at 09/24/2023 1920 Gross per 24 hour  Intake 240 ml  Output --  Net 240 ml     Physical Exam  General:  well appearing, sitting up in chair.  No respiratory difficulty HEENT: normal Neck: supple. JVD ~6 cm. Carotids 2+ bilat; no bruits. No lymphadenopathy or thyromegaly appreciated. Cor: PMI nondisplaced. Irregular rate & rhythm. No rubs, gallops or murmurs. Lungs: clear Abdomen: soft, nontender, nondistended. No hepatosplenomegaly. No bruits or masses. Good bowel sounds. Extremities: no cyanosis, clubbing, rash, trace RLE edema. + Ted hose Neuro: alert & oriented x 3, cranial nerves grossly intact. moves all 4 extremities w/o difficulty. Affect pleasant.  Telemetry   Atrial fibrillation 100-110s  (personally reviewed)  Labs    CBC Recent Labs    09/23/23 0436  WBC 9.2  HGB 12.4*  HCT 37.4*  MCV 84.8  PLT 257   Basic Metabolic Panel Recent Labs    30/86/[birth year] 0436 09/24/23 0439  NA 136 136  K 3.1* 3.4*  CL 94* 93*  CO2 31 30  GLUCOSE 145* 241*  BUN 39* 40*  CREATININE 0.93  1.03  CALCIUM 8.5* 8.5*  MG 2.5* 2.5*   Liver Function Tests No results for input(s): "AST", "ALT", "ALKPHOS", "BILITOT", "PROT", "ALBUMIN" in the last 72 hours.  No results for input(s): "LIPASE", "AMYLASE" in the last 72 hours. Cardiac Enzymes No results for input(s): "CKTOTAL", "CKMB", "CKMBINDEX", "TROPONINI" in the last 72 hours.  BNP: BNP (last 3 results) Recent Labs    08/19/23 2232 09/17/23 0444  BNP 1,995.5* 2,499.1*    ProBNP (last 3 results) No results for input(s): "PROBNP" in the last 8760 hours.   D-Dimer No results for input(s): "DDIMER" in the last 72 hours. Hemoglobin A1C No results for input(s): "HGBA1C" in the last 72 hours. Fasting Lipid Panel No results for input(s): "CHOL", "HDL", "LDLCALC", "TRIG", "CHOLHDL", "LDLDIRECT" in the last 72 hours. Thyroid Function Tests Recent Labs    09/22/23 0916  TSH <0.010*  T3FREE 11.7*     Other results:   Imaging    No results found.   Medications:     Scheduled Medications:  apixaban  5 mg Oral BID   benzonatate  200 mg Oral TID   digoxin  0.125 mg Oral Daily   empagliflozin  25 mg Oral Daily   FLUoxetine  40 mg Oral Daily   furosemide  80 mg Intravenous BID   hydrocortisone sod succinate (SOLU-CORTEF) inj  100 mg Intravenous Q8H   insulin aspart  0-15 Units Subcutaneous TID AC & HS   methimazole  15 mg Oral TID   pantoprazole  40 mg Oral QPM   pravastatin  40 mg Oral QHS   propranolol  10 mg Oral TID    Infusions:  sodium chloride      PRN Medications: guaiFENesin-dextromethorphan, ipratropium, levalbuterol, lidocaine-prilocaine, menthol-cetylpyridinium, ondansetron **OR** ondansetron (ZOFRAN) IV, zolpidem   Assessment/Plan  1. 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  He was taking a lower dose of methimazole than prescribed and also did not get prednisone at home.  He had prior episode of AF in 2021, had been on amiodarone  since that time.  Amiodarone was stopped in 12/24 with type 2 AIT.  Today, HR in 120s-130s.  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  Needs to get back into NSR though antiarrhythmic options are limited.  Would avoid amiodarone use unless absolutely necessary, and with persistently long QTc,  do not think that he will be able to take Tikosyn.  - Continue digoxin for rate control.  - Continue propranolol 10 mg TID - Continue Eliquis 5 mg bid.  - Continue hyperthyroidism treatment with methimazole and prednisone.   - TEE/DCCV 1/14 failed conversion x3 - Ultimately, he should be evaluated for AF ablation.  - EP consulted 09/24/23, recommended transfer to [facility] for inpatient endocrinology consult.  2. 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 Thyroid stimulating antibodies were negative in 12/24, less likely type 1. He was taking lower than prescribed dose of methimazole at home and never got prednisone after last discharge.  - Methimazole 15 mg tid.  - Prednisone restarted yesterday for type II AIT, switched to solu-cortef per primary team - Continue propranolol 10 mg TID, denies dizziness. May be able to increase to 20 TID - Needs tx to [facility] for endocrinologist consult 3. Acute on chronic systolic CHF: Presumed nonischemic cardiomyopathy, ?tachycardia-mediated with AF/RVR.  Echo in 3/21 with EF < 20%.  He was in atrial fibrillation with RVR at that time, converted to NSR on amiodarone.  Slow improvement in EF over time, echo in 4/24 with EF 40-45%, moderate aortic stenosis.  Recurrent AF/RVR in 12/24, echo with EF 20-25% with normal RV, mild-moderate MR, moderate AS with mean gradient 16 mmHg. Suspect fall in LV systolic function was tachycardia-mediated. He remains in AF/RVR.  SBP 90s, no room to titrate GDMT.  Weight overall down ~18lbs. - Appears close to euvolemia will stop IV lasix and start Torsemide 20 mg daily - Continue digoxin 0.125. Dig level  0.7 09/21/23 (dose cut back) - Continue propranolol 10 mg TID - Continue Jardiance.  - Removed UNNA boots, continue compression socks - Conversion back to NSR difficult with hyperthyroid.  - If EF does not improve significantly in NSR, consider right/left heart cath in future.  4. Aortic stenosis: Moderate on echo this admission.  5. DM2: Watch glucose with steroid use.   Plan for transfer to [facility] hopefully today.   Length of Stay: 8  Alen Bleacher, NP  09/25/2023, 7:39 AM  Advanced Heart Failure Team Pager [phone] (M-F; 7a - 5p)  Please contact [facility] for night-coverage after hours (5p -7a ) and weekends on [email or web address]  Agree with above.   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 EF 20-25% by echo. Failed DC-CV x3.  Therapy complicated by prolonged QT.   Remains in AF with rates in 120s Denies CP  or SOB. Volume status better. BP remains marginal.  General:  Sitting up HEENT: normal Neck: supple. no JVD. Carotids 2+ bilat; no bruits. No lymphadenopathy or thryomegaly appreciated. Cor: PMI nondisplaced. Irregular tachy Lungs: clear Abdomen: soft, nontender, nondistended. No hepatosplenomegaly. No bruits or masses. Good bowel sounds. Extremities: no cyanosis, clubbing, rash, tr edema Neuro: alert & orientedx3, cranial nerves grossly intact. moves all 4 extremities w/o difficulty. Affect pleasant  Clinically improved buyt still quite tenuous in setting of thyroid storm. I have spoken with Dr. Marylouise Stacks (Endo). Will continue steroids, methimazone and propanolol. Add iodine drops for faster effect. Will move to [facility] ICU for closer monitoring.   I have also spoken to Dr. Lalla Brothers about potential for Tikosyn. QT is long but mainly driven by LBBB (JT interval OK). He feels he is currently too high rish for Tikosyn. Will continue treatment of thyroid storm and attempt repeat DC-CV once more stable.   CRITICAL CARE Performed  by: Arvilla Meres  Total critical care time: 55 minutes  Critical care time was exclusive of separately billable procedures and treating other patients.  Critical care was necessary to treat or prevent imminent or life-threatening deterioration.  Critical care was time spent personally by me (independent of midlevel providers or residents) on the following activities: development of treatment plan with patient and/or surrogate as well as nursing, discussions with consultants, evaluation of patient's response to treatment, examination of patient, obtaining history from patient or surrogate, ordering and performing treatments and interventions, ordering and review of laboratory studies, ordering and review of radiographic studies, pulse oximetry and re-evaluation of patient's condition.  Arvilla Meres, MD  4:26 PM

## 2023-09-25 NOTE — Progress Notes (Signed)
Progress Note   Patient: William [name]. ZOX:[id number] DOB: Feb 24, 1956 DOA: 09/17/2023     8 DOS: the patient was seen and examined on 09/25/2023   Brief hospital course: Hospital course / significant events:   HPI:  William [name]. is a 68 y.o. male with medical history significant of HFrEF, atrial fibrillation, type 2 diabetes, aortic stenosis, history of gastric bypass, amiodarone induced hyperthyroidism presenting with cough, chest pain. Had been admitted 08/2023 for decompensated heart failure, A-fib with RVR, amiodarone induced hypothyroidism. Has been on methimazole, prednisone taper, Eliquis, metoprolol, Lasix. Patient states he was discharged with a cough that progressively improved but then worsened acutely over the past 1 to 2 weeks. Has had mild nonspecific chest pain over the past few days.    01/08: admitted for chest pain r/o ACS, exacerbation HFrEF, Afib w/ RVR - given soft BP have held rate control and diuresing w/ caution. Cardiology consulted - starting IV digoxin and transition to po tomorrow, cautious lasix, GDMT limited by hypotension. Of note, taking methimazole 5 mg tid not 15 mg tid as rx.  01/09: HR still elevated, cardio recs for DCCV planning this for tomorrow  01/10: delay in DCCV - cannot safely sedate given recent mounjaro per anesthesia - at this point given high risk for general anesthesia planis to wait the standard 7 days from last dose which puts him at 09/23/23 unless he decompensates in the meantime.  01/11-01/12: continuing diuresis, Planning for TEE/DCCV on 1/14 after [name] has been held for 1 week. BP remains low  01/13: runs of limited VT, cardiology aware, pt has no symptoms 01/14: DCCV unsuccessful, planning reattempt   1/15: Cardiology recommended to transfer him to [facility] inpatient Endocrinology for Thyroid Storm. No beds anywhere. 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  Blood pressure is low,  Cardiology following: Continue digoxin, Eliquis Continue treatment with methimazole: change prednisone to IV Hydrocortisone 100mg  Q8hrs  No further DCCV by cardiology (failed conversion x3) As the blood pressure improves, will increase the dose of Propanolol and or Toprol XL for better HR control Burch-Wartofsky Point Scale (BWPS) for Thyrotoxicosis=50: Highly suggestive of thyroid storm [facility] for transfer to inpatient endocrinology--> no beds at this time--> denied transfer-> recommended to call back tomorrow  Hypotension: Soft BP likely from low EF, f/u BP  Intermittent Vtach  Asymptomatic Cardiology team aware Continue telemetry Supplement K and Mg   Acute chest wall pain - resolved  Nonspecific likely multifactorial in setting of HF, Afib No ACS per cardiology monitor   Acute on chronic HFrEF (heart failure with reduced ejection fraction) 2D echo December 2024 with a EF of 20 to 25% Cardiology following Continue Lasix Continue beta-blocker, Jardiance, and digoxin Low blood pressure limiting GDMT Needs repeat echo in 3 months to assess for recovery of LVEF and ICD candidacy.     Thyroid storm: Burch-Wartofsky Point Scale (BWPS) for Thyrotoxicosis=50: Highly suggestive of thyroid storm admission December 2024 for amiodarone induced hypothyroidism On methimazole and recent course of prednisone TSH <0.010  Free T4: 5.37 continue methimazole 15mg  TID changed prednisone to IV Hydrocortisone 100mg  Q8hrs  As the blood pressure improves,  will increase the dose of Propanolol and or Toprol XL for better HR control [facility] again on 1/16 for transfer to inpatient endocrinology--> no beds at this time--> denied transfer-> recommended to call back tomorrow   Cough, question URI vs amiodarone tox  Noted recurrent cough over multiple weeks with recent evaluation for issues including A-fib with RVR, acute on chronic HFrEF, amiodarone induced hyperthyroidism CT of the chest on admission grossly stable apart from pleural effusions Add as needed DuoNebs PPI Consider pulmonary follow-up if persists given prior amiodarone toxicity with potential for pulmonary impact   GERD (gastroesophageal reflux disease) PPI   Type 2 diabetes mellitus without complication (HCC) Blood sugars 170s SSI  Monitor  Hypokalemia Replace with PO K+ Monitor BMP  Class 1 obesity based on BMI: Body mass index is 32.38 kg/m.  Underweight - under 18  overweight - 25 to 29 obese - 30 or more Class 1 obesity: BMI of 30.0 to 34 Class 2 obesity: BMI of 35.0 to 39 Class 3 obesity: BMI of 40.0 to 49 Super Morbid Obesity: BMI 50-59 Super-super Morbid Obesity: BMI 60+ Significantly low or high BMI is associated with higher medical risk.  Weight management advised as adjunct to other disease management and risk reduction treatments   DVT prophylaxis: eliquis IV fluids: no continuous IV fluids  Nutrition: cardaic diet Central lines / invasive devices: noen  Code Status: FULL CODE ACP documentation reviewed:  none on file in [facility]  TOC needs: TBD but expect d/c back home with no home health needs  Barriers to dispo / significant pending items: DCCV per cardiology       Subjective: Denies fever, chills, dizziness Reported some headache Denies difficulty breathing, chest pain, abdominal symptoms Mild swelling in legs+   Physical Exam: Vitals:   09/24/23 2033 09/25/23 0012 09/25/23 0500 09/25/23 0920  BP: 99/69 101/88  92/72  Pulse: (!) 122 (!) 128  (!) 140  Resp: 20 20  16   Temp: 98.4 F (36.9 C) 98.2 F  (36.8 C)  98.5 F (36.9 C)  TempSrc: Oral Oral    SpO2: 94% 94%  95%  Weight:   106 kg   Height:      Physical Exam Constitutional:      General: He is not in acute distress.    Appearance: He is not ill-appearing.  HENT:     Head: Normocephalic and atraumatic.     Nose: Nose normal. No congestion.     Mouth/Throat:     Mouth: Mucous membranes are moist.     Pharynx: Oropharynx is clear. No oropharyngeal exudate.  Eyes:     Extraocular Movements: Extraocular movements intact.     Conjunctiva/sclera: Conjunctivae normal.  Cardiovascular:     Rate and Rhythm: Tachycardia present. Rhythm irregular.  Pulmonary:     Effort: Pulmonary effort is normal.     Breath sounds: Normal breath sounds.  Abdominal:     General: Abdomen is flat. Bowel sounds are normal.     Palpations: Abdomen is soft.  Musculoskeletal:        General: No tenderness. Normal range of motion.     Cervical back: Normal range of motion and neck supple.     Comments: Trace edema in LEs b/l  Skin:    General: Skin is warm.     Capillary Refill: Capillary refill takes 2 to 3 seconds.     Findings: No rash.  Neurological:     General: No focal deficit  present.     Mental Status: He is alert.     Cranial Nerves: No cranial nerve deficit.     Sensory: No sensory deficit.     Motor: No weakness.  Psychiatric:        Mood and Affect: Mood normal.        Thought Content: Thought content normal.     Data Reviewed:  Latest Reference Range & Units 09/24/23 04:39  Sodium 135 - 145 mmol/L 136  Potassium 3.5 - 5.1 mmol/L 3.4 (L)  Chloride 98 - 111 mmol/L 93 (L)  CO2 22 - 32 mmol/L 30  Glucose 70 - 99 mg/dL 621 (H)  BUN 8 - 23 mg/dL 40 (H)  Creatinine 3.08 - 1.24 mg/dL 6.57  Calcium 8.9 - 84.6 mg/dL 8.5 (L)  Anion gap 5 - 15  13  Magnesium 1.7 - 2.4 mg/dL 2.5 (H)  GFR, Estimated >60 mL/min >60  (L): Data is abnormally low (H): Data is abnormally high  Family Communication: updated plan of care with the pt  and pt's family at bedside  Disposition: Status is: Inpatient   Planned Discharge Destination:  Awaiting to transfer to [facility]    Time spent: 50 minutes  Author: Ernestene Mention, MD 09/25/2023 10:28 AM  For on call review [email or web address].

## 2023-09-25 NOTE — Consult Note (Signed)
Unna's boots removed per heart failure NP, will sign off.   Wynell Halberg [facility], CNS, [employer] [phone]

## 2023-09-25 NOTE — Progress Notes (Signed)
Pt refuses VS this AM.

## 2023-09-26 ENCOUNTER — Encounter (HOSPITAL_COMMUNITY): Payer: Self-pay | Admitting: Internal Medicine

## 2023-09-26 ENCOUNTER — Other Ambulatory Visit: Payer: Self-pay

## 2023-09-26 DIAGNOSIS — I5023 Acute on chronic systolic (congestive) heart failure: Secondary | ICD-10-CM

## 2023-09-26 DIAGNOSIS — I4819 Other persistent atrial fibrillation: Secondary | ICD-10-CM | POA: Diagnosis not present

## 2023-09-26 LAB — HEMOGLOBIN A1C
Hgb A1c MFr Bld: 7 % — ABNORMAL HIGH (ref 4.8–5.6)
Mean Plasma Glucose: 154.2 mg/dL

## 2023-09-26 LAB — GLUCOSE, CAPILLARY
Glucose-Capillary: 186 mg/dL — ABNORMAL HIGH (ref 70–99)
Glucose-Capillary: 341 mg/dL — ABNORMAL HIGH (ref 70–99)
Glucose-Capillary: 253 mg/dL — ABNORMAL HIGH (ref 70–99)
Glucose-Capillary: 181 mg/dL — ABNORMAL HIGH (ref 70–99)
Glucose-Capillary: 283 mg/dL — ABNORMAL HIGH (ref 70–99)

## 2023-09-26 LAB — BASIC METABOLIC PANEL
Anion gap: 13 (ref 5–15)
BUN: 50 mg/dL — ABNORMAL HIGH (ref 8–23)
CO2: 27 mmol/L (ref 22–32)
Calcium: 8.6 mg/dL — ABNORMAL LOW (ref 8.9–10.3)
Chloride: 95 mmol/L — ABNORMAL LOW (ref 98–111)
Creatinine, Ser: 1.32 mg/dL — ABNORMAL HIGH (ref 0.61–1.24)
GFR, Estimated: 59 mL/min — ABNORMAL LOW (ref >60)
Glucose, Bld: 162 mg/dL — ABNORMAL HIGH (ref 70–99)
Potassium: 3.6 mmol/L (ref 3.5–5.1)
Sodium: 135 mmol/L (ref 135–145)

## 2023-09-26 LAB — THYROID STIMULATING IMMUNOGLOBULIN: Thyroid Stimulating Immunoglob: <0.1 [IU]/L (ref 0.00–0.55)

## 2023-09-26 LAB — THYROGLOBULIN ANTIBODY: Thyroglobulin Antibody: <1 [IU]/mL (ref 0.0–0.9)

## 2023-09-26 LAB — MRSA NEXT GEN BY PCR, NASAL: MRSA by PCR Next Gen: NOT DETECTED

## 2023-09-26 LAB — THYROID PEROXIDASE ANTIBODY: Thyroperoxidase Ab SerPl-aCnc: 12 [IU]/mL (ref 0–34)

## 2023-09-26 LAB — T3: T3, Total: 257 ng/dL — ABNORMAL HIGH (ref 71–180)

## 2023-09-26 MED ORDER — TORSEMIDE 20 MG PO TABS
40.0000 mg | ORAL_TABLET | Freq: Every day | ORAL | Status: DC
  Administered 2023-09-26 – 2023-09-30 (×5): 40 mg via ORAL
  Filled 2023-09-26 (×5): qty 2

## 2023-09-26 MED ORDER — ZOLPIDEM TARTRATE 5 MG PO TABS
5.0000 mg | ORAL_TABLET | Freq: Every evening | ORAL | Status: DC | PRN
  Administered 2023-09-26 – 2023-09-29 (×5): 5 mg via ORAL
  Filled 2023-09-26 (×5): qty 1

## 2023-09-26 MED ORDER — GUAIFENESIN-DM 100-10 MG/5ML PO SYRP
5.0000 mL | ORAL_SOLUTION | ORAL | Status: DC | PRN
  Filled 2023-09-26: qty 5

## 2023-09-26 MED ORDER — POTASSIUM CHLORIDE CRYS ER 20 MEQ PO TBCR
40.0000 meq | EXTENDED_RELEASE_TABLET | Freq: Once | ORAL | Status: AC
  Administered 2023-09-26: 40 meq via ORAL
  Filled 2023-09-26: qty 2

## 2023-09-26 MED ORDER — CHLORHEXIDINE GLUCONATE CLOTH 2 % EX PADS
6.0000 | MEDICATED_PAD | Freq: Every day | CUTANEOUS | Status: DC
  Administered 2023-09-26 – 2023-09-30 (×5): 6 via TOPICAL

## 2023-09-26 MED ORDER — INSULIN ASPART 100 UNIT/ML IJ SOLN
5.0000 [IU] | Freq: Three times a day (TID) | INTRAMUSCULAR | Status: DC
  Administered 2023-09-26 – 2023-09-28 (×7): 5 [IU] via SUBCUTANEOUS

## 2023-09-26 MED ORDER — MENTHOL 3 MG MT LOZG
1.0000 | LOZENGE | OROMUCOSAL | Status: DC | PRN
  Administered 2023-09-26 – 2023-09-28 (×2): 3 mg via ORAL
  Filled 2023-09-26 (×2): qty 9

## 2023-09-26 NOTE — Anesthesia Postprocedure Evaluation (Signed)
Anesthesia Post Note  Patient: William [name].  Procedure(s) Performed: TRANSESOPHAGEAL ECHOCARDIOGRAM (TEE)  Patient location during evaluation: Specials Recovery Anesthesia Type: General Level of consciousness: awake and alert Pain management: pain level controlled Vital Signs Assessment: post-procedure vital signs reviewed and stable Respiratory status: spontaneous breathing, nonlabored ventilation, respiratory function stable and patient connected to nasal cannula oxygen Cardiovascular status: blood pressure returned to baseline and stable Postop Assessment: no apparent nausea or vomiting Anesthetic complications: no   No notable events documented.   Last Vitals:  Vitals:   09/25/23 1508 09/25/23 1531  BP: 93/71 93/71  Pulse: (!) 128 (!) 128  Resp: 16 20  Temp: 36.6 C 36.6 C  SpO2: 96% 98%    Last Pain:  Vitals:   09/25/23 1531  TempSrc: Oral  PainSc:                  Lenard Simmer

## 2023-09-26 NOTE — Plan of Care (Signed)

## 2023-09-26 NOTE — Progress Notes (Signed)
   Plan for cardioversion on Monday. Scheduled.   Jaydon Soroka NP-C  1:58 PM

## 2023-09-26 NOTE — Progress Notes (Addendum)
Advanced Heart Failure Rounding Note  Cardiologist: Julien Nordmann, MD  Chief Complaint: A fib RVR /Hyperthyroidism  Subjective:    1/16 Transfer from [facility] to [facility] for thyroid storm/A fib management. Continued on  methimazole and hydrocortisone IV and started on potassium iodine drops.   Denies SOB. Deniess dizziness.   Objective:   Weight Range: 101.6 kg Body mass index is 31.24 kg/m.   Vital Signs:   Temp:  [97.8 F (36.6 C)-98.7 F (37.1 C)] 98.3 F (36.8 C) (01/17 0318) Pulse Rate:  [103-140] 120 (01/17 0530) Resp:  [9-23] 17 (01/17 0530) BP: (81-109)/(38-90) 97/73 (01/17 0530) SpO2:  [92 %-98 %] 95 % (01/17 0530) Weight:  [101.6 kg-102.3 kg] 101.6 kg (01/17 0500) Last BM Date : 09/24/23  Weight change: Filed Weights   09/25/23 1633 09/26/23 0500  Weight: 102.3 kg 101.6 kg    Intake/Output:   Intake/Output Summary (Last 24 hours) at 09/26/2023 0656 Last data filed at 09/26/2023 0500 Gross per 24 hour  Intake 840 ml  Output 500 ml  Net 340 ml      Physical Exam    General: No resp difficulty. In bed.  HEENT: Normal Neck: Supple. JVP 8-9. Carotids 2+ bilat; no bruits. No lymphadenopathy or thyromegaly appreciated. Cor: PMI nondisplaced. Tachy Irregular. No rubs, gallops. RUSB 2/6  murmurs Lungs: Clear Abdomen: Soft, nontender, nondistended. No hepatosplenomegaly. No bruits or masses. Good bowel sounds. Extremities: No cyanosis, clubbing, rash, edema Neuro: Alert & orientedx3, cranial nerves grossly intact. moves all 4 extremities w/o difficulty. Affect pleasant   Telemetry   A fib 110-130s personally checked.   EKG    N/A  Labs    CBC Recent Labs    09/25/23 2130  WBC 9.7  NEUTROABS 6.5  HGB 12.7*  HCT 39.0  MCV 84.8  PLT 300   Basic Metabolic Panel Recent Labs    16/10/96 0649 09/25/23 2130 09/26/23 0211  NA 137 131* 135  K 3.3* 3.8 3.6  CL 93* 93* 95*  CO2 28 27 27   GLUCOSE 196* 307* 162*  BUN 45* 53* 50*  CREATININE 0.99  1.51* 1.32*  CALCIUM 8.8* 8.3* 8.6*  MG 2.8* 2.5*  --    Liver Function Tests Recent Labs    09/25/23 1139 09/25/23 2130  AST 25 21  ALT 24 19  ALKPHOS 76 65  BILITOT 0.9 0.5  PROT 7.0 5.8*  ALBUMIN 3.6 2.9*   No results for input(s): "LIPASE", "AMYLASE" in the last 72 hours. Cardiac Enzymes No results for input(s): "CKTOTAL", "CKMB", "CKMBINDEX", "TROPONINI" in the last 72 hours.  BNP: BNP (last 3 results) Recent Labs    08/19/23 2232 09/17/23 0444  BNP 1,995.5* 2,499.1*    ProBNP (last 3 results) No results for input(s): "PROBNP" in the last 8760 hours.   D-Dimer No results for input(s): "DDIMER" in the last 72 hours. Hemoglobin A1C No results for input(s): "HGBA1C" in the last 72 hours. Fasting Lipid Panel No results for input(s): "CHOL", "HDL", "LDLCALC", "TRIG", "CHOLHDL", "LDLDIRECT" in the last 72 hours. Thyroid Function Tests Recent Labs    09/25/23 0649  TSH <0.010*    Other results:   Imaging    No results found.   Medications:     Scheduled Medications:  apixaban  5 mg Oral BID   benzonatate  200 mg Oral TID   Chlorhexidine Gluconate Cloth  6 each Topical Daily   digoxin  0.125 mg Oral Daily   empagliflozin  25 mg Oral Daily  FLUoxetine  40 mg Oral Daily   hydrocortisone sod succinate (SOLU-CORTEF) inj  100 mg Intravenous Q8H   insulin aspart  0-15 Units Subcutaneous TID WC   insulin aspart  0-5 Units Subcutaneous QHS   insulin glargine-yfgn  10 Units Subcutaneous Daily   methIMAzole  15 mg Oral TID   pantoprazole  40 mg Oral QHS   potassium iodide  250 mg Oral BID   pravastatin  40 mg Oral QHS   propranolol  10 mg Oral TID   sodium chloride flush  3 mL Intravenous Q12H   torsemide  20 mg Oral Daily    Infusions:  sodium chloride      PRN Medications: sodium chloride, acetaminophen, ipratropium, ondansetron (ZOFRAN) IV, sodium chloride flush, zolpidem    Patient Profile  68 y.o. male with history of PAF, chronic  systolic CHF, hyperthyroidism, DM II, AS.  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    Assessment/Plan   1. 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  He was taking a lower dose of methimazole than prescribed and also did not get prednisone at home.  He had prior episode of AF in 2021, had been on amiodarone since that time.  Amiodarone was stopped in 12/24 with type 2 AIT.  Needs to get back into NSR though antiarrhythmic options are limited.  Would avoid amiodarone use unless absolutely necessary, and with persistently long QTc,  do not think that he will be able to take Tikosyn. Discussed Tikosyn with EP  but QT long  and felt to be high risk.  - Remains in A Fib RVR.  - Continue digoxin for rate control.  - Continue propranolol 10 mg TID- BP soft. No room to titrate.  - Continue Eliquis 5 mg bid.  - Continue hyperthyroidism treatment.    - TEE/DCCV 1/14 failed conversion x3. Plan for repeat DC-CV once stabilized.  - Ultimately, he should be evaluated for AF ablation.  - EP consulted 09/24/23, recommended transfer to [facility] for inpatient endocrinology consult. No beds available.  2. 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 Thyroid stimulating antibodies were negative in 12/24, less likely type 1. He was taking lower than prescribed dose of methimazole at home and never got prednisone after last discharge.  - Continue Methimazole 15 mg tid.  - Prednisone restarted 1/15 for type II AIT, switched to hydrocortisone per primary team - Continue propranolol 10 mg TID. BP soft. No room to increased.  - Dr. [name] discussed with local Endocrinology. On 1/16 potassium iodide drops 250 mg BID started.   3. Acute on chronic systolic CHF: Presumed nonischemic cardiomyopathy, ?tachycardia-mediated with AF/RVR.  Echo in 3/21 with EF < 20%.  He was in  atrial fibrillation with RVR at that time, converted to NSR on amiodarone.  Slow improvement in EF over time, echo in 4/24 with EF 40-45%, moderate aortic stenosis.  Recurrent AF/RVR in 12/24, echo with EF 20-25% with normal RV, mild-moderate MR, moderate AS with mean gradient 16 mmHg. Suspect fall in LV systolic function was tachycardia-mediated. He remains in AF/RVR as noted above.    -  Volume status stable. Continue Torsemide 20 mg daily.  - Continue digoxin 0.125. Dig level 0.7 09/21/23 (dose cut back) - Continue propranolol 10 mg TID - Continue Jardiance 10 mg daily .[name]  - If EF does not improve significantly in NSR, consider right/left heart  cath in future.  4. Aortic stenosis: Moderate on echo this admission.  5. DM2: Watch glucose with steroid use. Continue SSI.    Length of Stay: 1  Amy Clegg, NP  09/26/2023, 6:56 AM  Advanced Heart Failure Team Pager [phone] (M-F; 7a - 5p)  Please contact [facility] for night-coverage after hours (5p -7a ) and weekends on [email or web address]  Patient seen with NP, agree with the above note.   Patient transferred from [facility] yesterday.  He remains in AF with RVR 110s-120s.  SBP 90s generally.   No dyspnea.   General: NAD Neck: JVP 8-9 cm, no thyromegaly or thyroid nodule.  Lungs: Clear to auscultation bilaterally with normal respiratory effort. CV: Nondisplaced PMI.  Heart tachy, irregular S1/S2, no S3/S4, 2/6 early SEM RUSB.  No peripheral edema.    Abdomen: Soft, nontender, no hepatosplenomegaly, no distention.  Skin: Intact without lesions or rashes.  Neurologic: Alert and oriented x 3.  Psych: Normal affect. Extremities: No clubbing or cyanosis.  HEENT: Normal.   Patient remains in rapid AF suspected driven by type 2 amiodarone-induced hyperthyroidism.  Rate remains uncontrolled.  He is off amiodarone.  QTc has been prolonged so he has not been on Tikosyn.  Unfortunately, thyroid indices drawn yesterday are unchanged with elevated T4 and T3  and negative thyroid autoantibodies.  - Continue propranolol 10 tid, no BP room to increase.  - Continue hydrocortisone - Continue methimazole 15 tid.  - Continue potassium iodide - Ultimately, will likely require thyroidectomy.  - Continue apixaban.  - Will reattempt DCCV Monday after a couple more days of aggressive hyperthyroidism treatment.  - ?If we need to restart amiodarone to keep in rhythm while we treat hyperthyroidism.  Will involve EP to help in decision-making.  - Will consult [facility] to help with hyperthyroidism.   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  - Increase torsemide to 40 mg daily.   Marca Ancona 09/26/2023 7:56 AM  -

## 2023-09-26 NOTE — TOC Initial Note (Signed)
Transition of Care (TOC) - Initial/Assessment Note    Patient Details  Name: William [name]. MRN: [id number] Date of Birth: 09-Oct-1955  Transition of Care [facility]) CM/SW Contact:    Elliot Cousin, RN Phone Number: 09/26/2023, 3:17 PM  Clinical Narrative:   readmission HF TOC CM spoke to pt and states he lives independent at home. He works full-time. Discussed FMLA/STD, states he will follow up with his HR. Pt reports doing daily weights and continues to monitor his diet after weight loss surgery.  Will continue to follow up for dc needs.                 Expected Discharge Plan: Home/Self Care Barriers to Discharge: Continued Medical Work up   Patient Goals and CMS Choice Patient states their goals for this hospitalization and ongoing recovery are:: wants to keep working and remain independent          Expected Discharge Plan and Services   Discharge Planning Services: CM Consult   Living arrangements for the past 2 months: Single Family Home                                      Prior Living Arrangements/Services Living arrangements for the past 2 months: Single Family Home Lives with:: Self Patient language and need for interpreter reviewed:: Yes Do you feel safe going back to the place where you live?: Yes      Need for Family Participation in Patient Care: No (Comment) Care giver support system in place?: No (comment) Current home services: DME (scale, cane, blood pressure cuff) Criminal Activity/Legal Involvement Pertinent to Current Situation/Hospitalization: No - Comment as needed  Activities of Daily Living   ADL Screening (condition at time of admission) Independently performs ADLs?: Yes (appropriate for developmental age) Is the patient deaf or have difficulty hearing?: No Does the patient have difficulty seeing, even when wearing glasses/contacts?: No Does the patient have difficulty concentrating, remembering, or making decisions?:  No  Permission Sought/Granted Permission sought to share information with : Case Manager, Family Supports, PCP Permission granted to share information with : Yes, Verbal Permission Granted  Share Information with NAME: Diane     Permission granted to share info w Relationship: sister  Permission granted to share info w Contact Information: [phone]  Emotional Assessment Appearance:: Appears stated age Attitude/Demeanor/Rapport: Engaged Affect (typically observed): Accepting Orientation: : Oriented to Self, Oriented to Place, Oriented to  Time, Oriented to Situation   Psych Involvement: No (comment)  Admission diagnosis:  Unspecified atrial fibrillation [I48.91] Other chest pain [R07.89] Hyperthyroidism [E05.90] Acute on chronic systolic CHF (congestive heart failure) (HCC) [I50.23] Patient Active Problem List   Diagnosis Date Noted   Acute on chronic systolic CHF (congestive heart failure) (HCC) 09/25/2023   Thyroid storm 09/25/2023   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   Hyperthyroidism 09/17/2023   Cough 09/17/2023   Atrial fibrillation with RVR (HCC) 08/23/2023   History of gastric bypass 08/20/2023   Aortic stenosis, mild 08/20/2023   Upper respiratory tract infection 08/20/2023   Acute exacerbation of CHF (congestive heart failure) (HCC) 08/20/2023   Scrotal edema 08/20/2023   Dilated cardiomyopathy (HCC) 01/06/2022   PAF s/p cardioversion (paroxysmal atrial fibrillation) (HCC) 01/06/2022   Type 2 diabetes mellitus without complication (HCC) 11/24/2019   GERD (gastroesophageal reflux disease) 11/24/2019   Acute on chronic HFrEF (heart failure with reduced ejection fraction) (HCC) 11/24/2019  Depression 11/24/2019   Renal mass, right 11/24/2019   Primary localized osteoarthritis of right hip 08/20/2016   PCP:  Lynnea Ferrier, MD Pharmacy:   [facility] [phone] [facility], [place] - [address] AT [facility] [address] [facility] [place] [address] Phone: [phone] Fax: [phone]     Social Drivers of Health (SDOH) Social History: SDOH Screenings   Food Insecurity: No Food Insecurity (09/26/2023)  Housing: Low Risk  (09/26/2023)  Transportation Needs: No Transportation Needs (09/26/2023)  Utilities: Not At Risk (09/26/2023)  Depression (PHQ2-9): Low Risk  (11/23/2021)  Financial Resource Strain: Low Risk  (05/16/2023)   Received from [facility]  Social Connections: Moderately Integrated (09/26/2023)  Stress: Stress Concern Present (12/03/2019)   Received from [facility]  Tobacco Use: Low Risk  (09/26/2023)   SDOH Interventions:     Readmission Risk Interventions     No data to display

## 2023-09-26 NOTE — Consult Note (Addendum)
Initial Consultation Note   Patient: William [name]. UUV:[id number] DOB: 02/12/1956 PCP: Lynnea Ferrier, MD   Assessment and Plan:  Amiodarone induced thyrotoxicosis, thyroid storm, Type 2 -no beds available at [facility] for inpatient transfer -Amiodarone discontinued last admission 4 weeks ago -Suspect current flare related to interruption of recent steroid therapy -TSH undetectable, free T3 and T4 remain elevated since last week -Thyroid-stimulating immunoglobulin and thyroid peroxidase antibodies negative now and back in September 30, 2024 -I called and reviewed case with local endocrinologist -Continue methimazole 15 Mg 3 times daily -Propranolol 10 Mg 3 times daily -Potassium iodine X 2 days -IV hydrocortisone 100mg  TID X 2 to 3 days, then prednisone -If no improvement in free T4/T3, could consider short-term change of methimazole to PTU 300 Mg TID while inpatient -Check free T4/T3 daily x few days, no value in repeating TSH frequently  Hyperglycemia Secondary to high-dose IV steroids -Check HbA1c, agree with glargine, add meal coverage NovoLog  Afib RVR -Triggered by thyrotoxicosis, management as above, continue digoxin, propranolol, Eliquis  Acute on chronic systolic CHF -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 is a 68 y.o. male was first diagnosed with CHF and A-fib RVR 3/21, EF was less than 20%, he was cardioverted and started on amiodarone, subsequently EF improved to 40-45%. -Subsequently admitted 08/20/2023 to [facility] with A-fib RVR and CHF in the setting of amiodarone induced thyroid toxicity, echo noted EF of 20-25%, moderate aortic stenosis, started on prednisone and methimazole, spontaneously converted to sinus rhythm and discharged home, apparently quickly ran out of prednisone and pharmacy dispensed 5 Mg 3 times daily of  methimazole instead of 15 Mg 3 times daily. 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   CHF (congestive heart failure) (HCC)    Diabetes mellitus without complication (HCC)    GERD (gastroesophageal reflux disease)    Hypertension    Iron deficiency anemia    Obesity    Osteoarthritis of hip    right   Peptic ulcer disease    Past Surgical History:  Procedure Laterality Date   CARDIOVERSION N/A 11/26/2019   Procedure: CARDIOVERSION;  Surgeon: Dalia Heading, MD;  Location: [facility] ORS;  Service: Cardiovascular;  Laterality: N/A;   COLONOSCOPY     ESOPHAGOGASTRODUODENOSCOPY     GASTRIC BYPASS     2009   NASAL SINUS SURGERY     TEE WITHOUT CARDIOVERSION N/A 09/23/2023   Procedure: TRANSESOPHAGEAL ECHOCARDIOGRAM (TEE);  Surgeon: Laurey Morale, MD;  Location: [facility] ORS;  Service: Cardiovascular;  Laterality: N/A;  TEE with cardioversion   TOTAL HIP ARTHROPLASTY Right 08/20/2016   Procedure: TOTAL HIP ARTHROPLASTY ANTERIOR APPROACH;  Surgeon: Kennedy Bucker, MD;  Location: [facility] ORS;  Service: Orthopedics;  Laterality: Right;   Social History:  reports that he has never smoked. He has never used smokeless tobacco. He reports that he does not drink alcohol and does not use drugs.  Allergies  Allergen Reactions   Morphine And Codeine Hives   Nsaids Other (See Comments)    Gastric bypass    Family History  Problem Relation Age of Onset   Dementia Mother    Diabetes Father     Prior to Admission medications   Medication Sig Start Date End Date  Taking? Authorizing Provider  Cyanocobalamin (VITAMIN B-12 PO) Take 1 tablet by mouth daily.   Yes [provider]  ELIQUIS 5 MG TABS tablet TAKE 1 TABLET BY MOUTH TWICE  DAILY 02/11/23  Yes Clarisa Kindred A, FNP  FLUoxetine (PROZAC) 40 MG capsule Take 1 capsule by mouth daily. 05/23/23  Yes [provider]  furosemide (LASIX) 40 MG tablet Take 0.5 tablets (20 mg total) by mouth daily. 08/24/23  Yes Lurene Shadow, MD  JARDIANCE 25 MG TABS tablet Take 25 mg by mouth daily. 11/02/19  Yes [provider]  metFORMIN (GLUCOPHAGE) 1000 MG tablet Take 1,000 mg by mouth 2 (two) times daily with a meal.    Yes [provider]  methimazole (TAPAZOLE) 5 MG tablet Take 3 tablets (15 mg total) by mouth 3 (three) times daily. Patient taking differently: Take 10 mg by mouth 2 (two) times daily. 08/24/23  Yes Lurene Shadow, MD  metoprolol succinate (TOPROL-XL) 50 MG 24 hr tablet Take 1 tablet (50 mg total) by mouth daily. Take with or immediately following a meal. 08/24/23  Yes Lurene Shadow, MD  Multiple Vitamins-Minerals (MULTIVITAMIN MEN) TABS Take 1 tablet by mouth daily.   Yes [provider]  pantoprazole (PROTONIX) 40 MG tablet Take 40 mg by mouth every evening.    Yes [provider]  pravastatin (PRAVACHOL) 40 MG tablet Take 40 mg by mouth at bedtime.  09/22/19  Yes [provider]  tirzepatide [name]) 12.5 MG/0.5ML Pen Inject 12.5 mg into the skin every Tuesday. 05/16/23  Yes [provider]  potassium chloride SA (KLOR-CON M) 20 MEQ tablet Take 1 tablet (20 mEq total) by mouth daily for 5 days. Patient not taking: Reported on 09/25/2023 08/24/23 11/15/23  Lurene Shadow, MD    Physical Exam: Vitals:   09/26/23 0900 09/26/23 1000 09/26/23 1100 09/26/23 1200  BP: (!) 78/68 (!) 89/61  100/69  Pulse: (!) 130 (!) 117  (!) 116  Resp: (!) 21 19  16   Temp:   (!) 97.3 F (36.3 C)   TempSrc:   Oral   SpO2: 96% 95%  98%  Weight:        Author: Zannie Cove, MD 09/26/2023 12:34 PM  For on call review [email or web address].

## 2023-09-26 NOTE — Consult Note (Signed)
ELECTROPHYSIOLOGY CONSULT NOTE    Patient ID: William [name]. MRN: [id number], DOB/AGE: 1956-03-10 68 y.o.  Admit date: 09/25/2023 Date of Consult: 09/26/2023  Primary Physician: Lynnea Ferrier, MD Primary Cardiologist: Julien Nordmann, MD  Electrophysiologist: New to Dr. Lalla Brothers 1/15   Referring Provider: Dr. Shirlee Latch  Patient Profile: William Bubier. is a 68 y.o. male with a history of AF since COVID vaccine in 2021, chronic systolic CHF, and hyperthyroidism who is being seen today for the evaluation of tachycardia and AF in the setting of thyroid storm at the request of Dr. Shirlee Latch.  HPI:  William Leventhal. is a 68 y.o. male with medical history as above.   He presented to [facility] January 8 with progressively worsening shortness of breath, starting in November. Treated for bronchitis. This admission, he was in decompensated systolic heart failure. He denied fever. He reports tremulousness. He reports increased heart rates. He reports unintentional weight loss. He reports hair changes, nonspecific. During his hospital stay so far, his heart rates have been in the 120s in atrial fibrillation. Hypotension has limited up titration of beta-blockade. His QT is long preventing use of Tikosyn. He underwent a cardioversion 1/14 that was unsuccessful x 3. EP was asked to weigh in and recommended transfer to [facility] for inpatient endocrinology management.   With lack of beds, pt was transferred to [facility] for thyroid storm/AF management . Continued on methimazole and hydrocortisone IV. In consultation pt was also started on potassium iodine.   EP asked to see again today to follow course.   Pt remains in AF 110-130s with tentative plans to retry cardioversion next week as his thyroid is treated. Potassium Iodine just started. Pt is currently feeling OK at rest. Denies CP or SOB. BP remains soft. Complains of taste and mouth-feel of Potassium Iodine. Begrudgingly understands treatment options  are limited while we wait for his thyroid to respond.   Echo 08/20/2023 EF 20-25% with normal RV, mild-moderate MR, moderate AS with mean gradient 16 mmHg. Suspect fall in LV systolic function was tachycardia-mediated   TSH <0.010 T4 5.37 T3  257   Labs Potassium3.6 (01/17 0211) Magnesium  2.5* (01/16 2130) Creatinine, ser  1.32* (01/17 0211) PLT  300 (01/16 2130) HGB  12.7* (01/16 2130) WBC 9.7 (01/16 2130)  .    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   CHF (congestive heart failure) (HCC)    Diabetes mellitus without complication (HCC)    GERD (gastroesophageal reflux disease)    Hypertension    Iron deficiency anemia    Obesity    Osteoarthritis of hip    right   Peptic ulcer disease      Surgical History:  Past Surgical History:  Procedure Laterality Date   CARDIOVERSION N/A 11/26/2019   Procedure: CARDIOVERSION;  Surgeon: Dalia Heading, MD;  Location: [facility] ORS;  Service: Cardiovascular;  Laterality: N/A;   COLONOSCOPY     ESOPHAGOGASTRODUODENOSCOPY     GASTRIC BYPASS     2009   NASAL SINUS SURGERY     TEE WITHOUT CARDIOVERSION N/A 09/23/2023   Procedure: TRANSESOPHAGEAL ECHOCARDIOGRAM (TEE);  Surgeon: Laurey Morale, MD;  Location: [facility] ORS;  Service: Cardiovascular;  Laterality: N/A;  TEE with cardioversion   TOTAL HIP ARTHROPLASTY Right 08/20/2016   Procedure: TOTAL HIP ARTHROPLASTY ANTERIOR APPROACH;  Surgeon: Kennedy Bucker, MD;  Location: [facility] ORS;  Service:  Orthopedics;  Laterality: Right;     Medications Prior to Admission  Medication Sig Dispense Refill Last Dose/Taking   Cyanocobalamin (VITAMIN B-12 PO) Take 1 tablet by mouth daily.   Past Month   ELIQUIS 5 MG TABS tablet TAKE 1 TABLET BY MOUTH TWICE  DAILY 180 tablet 3 Past Month   FLUoxetine (PROZAC) 40 MG capsule Take 1 capsule by mouth daily.   Past Month   furosemide (LASIX) 40 MG tablet Take 0.5 tablets (20 mg total) by mouth daily.   Past  Month   JARDIANCE 25 MG TABS tablet Take 25 mg by mouth daily.   Past Month   metFORMIN (GLUCOPHAGE) 1000 MG tablet Take 1,000 mg by mouth 2 (two) times daily with a meal.    Past Month   methimazole (TAPAZOLE) 5 MG tablet Take 3 tablets (15 mg total) by mouth 3 (three) times daily. (Patient taking differently: Take 10 mg by mouth 2 (two) times daily.) 270 tablet 0 Past Month   metoprolol succinate (TOPROL-XL) 50 MG 24 hr tablet Take 1 tablet (50 mg total) by mouth daily. Take with or immediately following a meal. 30 tablet 0 Past Month   Multiple Vitamins-Minerals (MULTIVITAMIN MEN) TABS Take 1 tablet by mouth daily.   Past Month   pantoprazole (PROTONIX) 40 MG tablet Take 40 mg by mouth every evening.    Past Month   pravastatin (PRAVACHOL) 40 MG tablet Take 40 mg by mouth at bedtime.    Past Month   tirzepatide [facility]) 12.5 MG/0.5ML Pen Inject 12.5 mg into the skin every Tuesday.   Past Month   potassium chloride SA (KLOR-CON M) 20 MEQ tablet Take 1 tablet (20 mEq total) by mouth daily for 5 days. (Patient not taking: Reported on 09/25/2023) 5 tablet 0 Not Taking    Inpatient Medications:   apixaban  5 mg Oral BID   benzonatate  200 mg Oral TID   Chlorhexidine Gluconate Cloth  6 each Topical Daily   digoxin  0.125 mg Oral Daily   empagliflozin  25 mg Oral Daily   FLUoxetine  40 mg Oral Daily   hydrocortisone sod succinate (SOLU-CORTEF) inj  100 mg Intravenous Q8H   insulin aspart  0-15 Units Subcutaneous TID WC   insulin aspart  0-5 Units Subcutaneous QHS   insulin glargine-yfgn  10 Units Subcutaneous Daily   methIMAzole  15 mg Oral TID   pantoprazole  40 mg Oral QHS   potassium chloride  40 mEq Oral Once   potassium iodide  250 mg Oral BID   pravastatin  40 mg Oral QHS   propranolol  10 mg Oral TID   sodium chloride flush  3 mL Intravenous Q12H   torsemide  40 mg Oral Daily    Allergies:  Allergies  Allergen Reactions   Morphine And Codeine Hives   Nsaids Other (See  Comments)    Gastric bypass    Family History  Problem Relation Age of Onset   Dementia Mother    Diabetes Father      Physical Exam: Vitals:   09/26/23 0430 09/26/23 0500 09/26/23 0530 09/26/23 0700  BP: 92/64 98/71 97/73  (!) 89/64  Pulse: (!) 119 (!) 123 (!) 120 (!) 123  Resp: 15 (!) 9 17 16   Temp:      TempSrc:      SpO2: 98% 97% 95% 96%  Weight:  101.6 kg      GEN- NAD, A&O x 3, normal affect HEENT: Normocephalic, atraumatic Lungs- CTAB,  Normal effort.  Heart- Regular rate and rhythm, No M/G/R.  GI- Soft, NT, ND.  Extremities- No clubbing, cyanosis, or edema   Radiology/Studies: CT Angio Chest/Abd/Pel for Dissection W and/or Wo Contrast Result Date: 09/17/2023 CLINICAL DATA:  Chest pain. EXAM: CT ANGIOGRAPHY CHEST, ABDOMEN AND PELVIS TECHNIQUE: Non-contrast CT of the chest was initially obtained. Multidetector CT imaging through the chest, abdomen and pelvis was performed using the standard protocol during bolus administration of intravenous contrast. Multiplanar reconstructed images and MIPs were obtained and reviewed to evaluate the vascular anatomy. RADIATION DOSE REDUCTION: This exam was performed according to the departmental dose-optimization program which includes automated exposure control, adjustment of the mA and/or kV according to patient size and/or use of iterative reconstruction technique. CONTRAST:  OMNIPAQUE IOHEXOL 350 MG/ML SOLN COMPARISON:  August 20, 2023. FINDINGS: CTA CHEST FINDINGS Cardiovascular: Preferential opacification of the thoracic aorta. No evidence of thoracic aortic aneurysm or dissection. Normal heart size. No pericardial effusion. Aortic valve calcifications are noted. Coronary artery calcifications are noted. Mediastinum/Nodes: No enlarged mediastinal, hilar, or axillary lymph nodes. Thyroid gland, trachea, and esophagus demonstrate no significant findings. Lungs/Pleura: Small bilateral pleural effusions are noted with minimal adjacent  subsegmental atelectasis. No pneumothorax is noted. Musculoskeletal: No chest wall abnormality. No acute or significant osseous findings. Review of the MIP images confirms the above findings. CTA ABDOMEN AND PELVIS FINDINGS VASCULAR Aorta: Atherosclerosis of abdominal aorta is noted without aneurysm or dissection. Celiac: Patent without evidence of aneurysm, dissection, vasculitis or significant stenosis. SMA: Patent without evidence of aneurysm, dissection, vasculitis or significant stenosis. Renals: Both renal arteries are patent without evidence of aneurysm, dissection, vasculitis, fibromuscular dysplasia or significant stenosis. IMA: Patent without evidence of aneurysm, dissection, vasculitis or significant stenosis. Inflow: Patent without evidence of aneurysm, dissection, vasculitis or significant stenosis. Veins: No obvious venous abnormality within the limitations of this arterial phase study. Review of the MIP images confirms the above findings. NON-VASCULAR Hepatobiliary: No focal liver abnormality is seen. No gallstones, gallbladder wall thickening, or biliary dilatation. Pancreas: Fatty replacement of the pancreas is noted. Spleen: Normal in size without focal abnormality. Adrenals/Urinary Tract: Adrenal glands are unremarkable. Large calcified renal cyst is noted in upper pole which is unchanged. Smaller cyst is seen more inferiorly in right kidney. No follow-up is required for these. No hydronephrosis or renal obstruction. Urinary bladder is unremarkable. Stomach/Bowel: Status post gastric bypass. Appendix appears normal. No evidence of bowel obstruction or inflammation. Lymphatic: No adenopathy. Reproductive: Prostate is unremarkable. Other: Small fat containing periumbilical hernia.  No ascites. Musculoskeletal: Status post right total hip arthroplasty. No acute osseous abnormality is noted. Review of the MIP images confirms the above findings. IMPRESSION: No evidence of thoracic or abdominal aortic  dissection or aneurysm. Small pleural effusions are noted. Coronary artery calcifications are noted. Aortic Atherosclerosis (ICD10-I70.0). Electronically Signed   By: Lupita Raider M.D.   On: 09/17/2023 08:41   DG Chest 2 View Result Date: 09/17/2023 CLINICAL DATA:  Chest pains and coughing. EXAM: CHEST - 2 VIEW COMPARISON:  Portable chest 08/20/2023 FINDINGS: 5:05 a.m. There is mild cardiomegaly. There is central vascular prominence and flow cephalization and increased mild generalized interstitial consolidation consistent with edema, with small pleural effusions beginning to blunt the sulci. No alveolar infiltrate is seen. The mediastinum is normally outlined. Interval right PICC removal. Thoracic cage is intact. IMPRESSION: Findings consistent with CHF and mild interstitial edema. Small pleural effusions. Electronically Signed   By: Almira Bar M.D.   On: 09/17/2023 06:13  EKG:on arrival shows AF RVR 110s (personally reviewed)  TELEMETRY: AF RVR 110-130s (personally reviewed)  Assessment/Plan:  Persistent atrial fibrillation Thyroid storm Hyperthyroidism Acute on chronic systolic heart failure  The patient is admitted with acute heart failure in the setting of thyroid storm. This thyroid dysregulation is secondary to amiodarone which has been on board for several years. His atrial fibrillation has been refractory to cardioversion and beta-blockade. Antiarrhythmic options are limited because of QTc prolongation. His atrial fibrillation is a secondary process to the thyroid dysregulation. Concerned about his overall clinical trajectory given his hypotension, severity of LV dysfunction.   Continue steroids, methimazole, and propanolol.  Continue potassium iodine   No beds available for [facility] transfer.   Would not recommend Tikosyn nor re-challenging with amiodarone.   Agree with re-trying cardioversion early next week once thyroid more thoroughly treated.   For questions or updates,  please contact [facility] Please consult [email or web address] for contact info under Cardiology/STEMI.  Dustin Flock, PA-C  09/26/2023 7:57 AM

## 2023-09-27 DIAGNOSIS — I5023 Acute on chronic systolic (congestive) heart failure: Secondary | ICD-10-CM | POA: Diagnosis not present

## 2023-09-27 LAB — GLUCOSE, CAPILLARY
Glucose-Capillary: 174 mg/dL — ABNORMAL HIGH (ref 70–99)
Glucose-Capillary: 299 mg/dL — ABNORMAL HIGH (ref 70–99)
Glucose-Capillary: 116 mg/dL — ABNORMAL HIGH (ref 70–99)
Glucose-Capillary: 227 mg/dL — ABNORMAL HIGH (ref 70–99)

## 2023-09-27 LAB — T4, FREE: Free T4: 5.01 ng/dL — ABNORMAL HIGH (ref 0.61–1.12)

## 2023-09-27 LAB — BASIC METABOLIC PANEL WITH GFR
Anion gap: 12 (ref 5–15)
BUN: 45 mg/dL — ABNORMAL HIGH (ref 8–23)
CO2: 29 mmol/L (ref 22–32)
Calcium: 8.7 mg/dL — ABNORMAL LOW (ref 8.9–10.3)
Chloride: 94 mmol/L — ABNORMAL LOW (ref 98–111)
Creatinine, Ser: 1.24 mg/dL (ref 0.61–1.24)
GFR, Estimated: >60 mL/min
Glucose, Bld: 238 mg/dL — ABNORMAL HIGH (ref 70–99)
Potassium: 3.6 mmol/L (ref 3.5–5.1)
Sodium: 135 mmol/L (ref 135–145)

## 2023-09-27 LAB — DIGOXIN LEVEL: Digoxin Level: 0.7 ng/mL — ABNORMAL LOW (ref 0.8–2.0)

## 2023-09-27 MED ORDER — POTASSIUM CHLORIDE CRYS ER 20 MEQ PO TBCR
40.0000 meq | EXTENDED_RELEASE_TABLET | Freq: Every day | ORAL | Status: DC
  Administered 2023-09-27: 40 meq via ORAL
  Filled 2023-09-27: qty 2

## 2023-09-27 MED ORDER — POTASSIUM IODIDE (EXPECTORANT) 1 GM/ML PO SOLN
250.0000 mg | Freq: Two times a day (BID) | ORAL | Status: AC
  Administered 2023-09-27: 250 mg via ORAL
  Filled 2023-09-27: qty 0.25

## 2023-09-27 MED ORDER — INSULIN GLARGINE-YFGN 100 UNIT/ML ~~LOC~~ SOLN
15.0000 [IU] | Freq: Every day | SUBCUTANEOUS | Status: DC
  Administered 2023-09-27: 15 [IU] via SUBCUTANEOUS
  Filled 2023-09-27 (×2): qty 0.15

## 2023-09-27 NOTE — Plan of Care (Signed)

## 2023-09-27 NOTE — Progress Notes (Signed)
PROGRESS NOTE    William [name].  YIR:[id number] DOB: 1956/05/04 DOA: 09/25/2023 PCP: Lynnea Ferrier, MD  68/M diagnosed with CHF and A-fib RVR 3/21, EF<20%, he was cardioverted and started on amiodarone, subsequently EF improved to 40-45%. -Subsequently admitted 08/20/2023 to [facility] with A-fib RVR and CHF in the setting of amiodarone induced thyroid toxicity, echo noted EF of 20-25%, moderate aortic stenosis, started on prednisone and methimazole, spontaneously converted to sinus rhythm and discharged home, apparently quickly ran out of prednisone and pharmacy dispensed 5 Mg 3 times daily of methimazole instead of 15 Mg 3 times daily. -Readmitted to [facility] 09/17/2023 with A-fib RVR acute on chronic systolic CHF, thyroid storm, restarted on methimazole 15 Mg 3 times daily, prednisone 40 Mg daily> switched to IV hydrocortisone 1/16, propranolol, transfer requested to [facility] for endocrinology evaluation for several days however [facility] at capacity, eventually transferred to [facility] 1/16  Subjective: -Tired, otherwise about the same  Assessment and Plan:  Amiodarone induced thyrotoxicosis, thyroid storm, Type 2 -Amiodarone discontinued last admission 4 weeks ago -Suspect current flare related to interruption of recent steroid therapy -TSH undetectable, free T3 and T4 remain elevated since last week -Thyroid-stimulating immunoglobulin and thyroid peroxidase antibodies negative now and back in 09/15/24 -I called and reviewed case with local endocrinologist yesterday -Continue methimazole 15 Mg 3 times daily -Propranolol 10 Mg 3 times daily -Potassium iodine X 2 days, discontinue after tonight's dose -IV hydrocortisone 100mg  TID X 1-2 more days then changed to prednisone -Labs pending today, if no improvement in free T3/T4, could consider short-term change of methimazole to PTU 300 Mg TID while inpatient -Check free T4/T3 daily x few days   Hyperglycemia Secondary to high-dose IV steroids -A1c is 7.0  (suspect he has steroid-induced hyperglycemia rather than diabetes), increase glargine, add meal coverage NovoLog   Afib RVR -Triggered by thyrotoxicosis, management as above, on digoxin, propranolol, Eliquis   Acute on chronic systolic CHF -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 -Now on oral torsemide, digoxin, Jardiance   Moderate aortic stenosis   DVT prophylaxis: Apixaban Code Status: Full code Family Communication: None present Disposition Plan:   Consultants:    Procedures:   Antimicrobials:    Objective: Vitals:   09/27/23 0915 09/27/23 0930 09/27/23 0945 09/27/23 1000  BP:      Pulse: (!) 129     Resp: (!) 32 (!) 21 16 (!) 22  Temp:      TempSrc:      SpO2: 97%     Weight:      Height:        Intake/Output Summary (Last 24 hours) at 09/27/2023 1034 Last data filed at 09/27/2023 0600 Gross per 24 hour  Intake 1440 ml  Output --  Net 1440 ml   Filed Weights   09/25/23 1633 09/26/23 0500 09/27/23 0500  Weight: 102.3 kg 101.6 kg 101 kg    Examination:  General exam: Frail chronically ill male sitting up in the recliner, AAOx3 HEENT: No JVD CVS: S1-S2, irregular rhythm Lungs: Clear bilaterally Abdomen: Soft, nontender, bowel sounds present Extremities: Trace edema  Skin: No rashes Psychiatry:  Mood & affect appropriate.     Data Reviewed:   CBC: Recent Labs  Lab 09/21/23 0503 09/22/23 0522 09/23/23 0436 09/25/23 2130  WBC 9.8 7.9 9.2 9.7  NEUTROABS  --   --   --  6.5  HGB 13.9 12.4* 12.4* 12.7*  HCT 41.6 37.1* 37.4* 39.0  MCV 83.9 83.7 84.8 84.8  PLT 288  234 257 300   Basic Metabolic Panel: Recent Labs  Lab 09/21/23 0954 09/22/23 0522 09/23/23 0436 09/24/23 0439 09/25/23 0649 09/25/23 2130 09/26/23 0211 09/27/23 0734  NA  --    < > 136 136 137 131* 135 135  K  --    < > 3.1* 3.4* 3.3* 3.8 3.6 3.6  CL  --    < > 94* 93* 93* 93* 95* 94*  CO2  --    < > 31 30 28 27 27 29   GLUCOSE  --    < > 145* 241* 196* 307* 162* 238*   BUN  --    < > 39* 40* 45* 53* 50* 45*  CREATININE  --    < > 0.93 1.03 0.99 1.51* 1.32* 1.24  CALCIUM  --    < > 8.5* 8.5* 8.8* 8.3* 8.6* 8.7*  MG 2.1  --  2.5* 2.5* 2.8* 2.5*  --   --    < > = values in this interval not displayed.   GFR: Estimated Creatinine Clearance: 70.9 mL/min (by C-G formula based on SCr of 1.24 mg/dL). Liver Function Tests: Recent Labs  Lab 09/25/23 1139 09/25/23 2130  AST 25 21  ALT 24 19  ALKPHOS 76 65  BILITOT 0.9 0.5  PROT 7.0 5.8*  ALBUMIN 3.6 2.9*   No results for input(s): "LIPASE", "AMYLASE" in the last 168 hours. No results for input(s): "AMMONIA" in the last 168 hours. Coagulation Profile: No results for input(s): "INR", "PROTIME" in the last 168 hours. Cardiac Enzymes: No results for input(s): "CKTOTAL", "CKMB", "CKMBINDEX", "TROPONINI" in the last 168 hours. BNP (last 3 results) No results for input(s): "PROBNP" in the last 8760 hours. HbA1C: Recent Labs    09/25/23 2130  HGBA1C 7.0*   CBG: Recent Labs  Lab 09/26/23 0829 09/26/23 1107 09/26/23 1636 09/26/23 2235 09/27/23 0557  GLUCAP 341* 253* 181* 283* 174*   Lipid Profile: No results for input(s): "CHOL", "HDL", "LDLCALC", "TRIG", "CHOLHDL", "LDLDIRECT" in the last 72 hours. Thyroid Function Tests: Recent Labs    09/25/23 0649 09/27/23 0734  TSH <0.010*  --   FREET4 5.37* 5.01*   Anemia Panel: No results for input(s): "VITAMINB12", "FOLATE", "FERRITIN", "TIBC", "IRON", "RETICCTPCT" in the last 72 hours. Urine analysis:    Component Value Date/Time   COLORURINE YELLOW (A) 08/07/2016 1337   APPEARANCEUR CLEAR (A) 08/07/2016 1337   LABSPEC 1.020 08/07/2016 1337   PHURINE 5.0 08/07/2016 1337   GLUCOSEU NEGATIVE 08/07/2016 1337   HGBUR NEGATIVE 08/07/2016 1337   BILIRUBINUR NEGATIVE 08/07/2016 1337   KETONESUR NEGATIVE 08/07/2016 1337   PROTEINUR NEGATIVE 08/07/2016 1337   NITRITE NEGATIVE 08/07/2016 1337   LEUKOCYTESUR NEGATIVE 08/07/2016 1337   Sepsis  Labs: @LABRCNTIP (procalcitonin:4,lacticidven:4)  ) Recent Results (from the past 240 hours)  Respiratory (~20 pathogens) panel by PCR     Status: None   Collection Time: 09/19/23  6:50 PM   Specimen: Nasopharyngeal Swab; Respiratory  Result Value Ref Range Status   Adenovirus NOT DETECTED NOT DETECTED Final   Coronavirus 229E NOT DETECTED NOT DETECTED Final    Comment: (NOTE) The Coronavirus on the Respiratory Panel, DOES NOT test for the novel  Coronavirus (2019 nCoV)    Coronavirus HKU1 NOT DETECTED NOT DETECTED Final   Coronavirus NL63 NOT DETECTED NOT DETECTED Final   Coronavirus OC43 NOT DETECTED NOT DETECTED Final   Metapneumovirus NOT DETECTED NOT DETECTED Final   Rhinovirus / Enterovirus NOT DETECTED NOT DETECTED Final   Influenza A  NOT DETECTED NOT DETECTED Final   Influenza B NOT DETECTED NOT DETECTED Final   Parainfluenza Virus 1 NOT DETECTED NOT DETECTED Final   Parainfluenza Virus 2 NOT DETECTED NOT DETECTED Final   Parainfluenza Virus 3 NOT DETECTED NOT DETECTED Final   Parainfluenza Virus 4 NOT DETECTED NOT DETECTED Final   Respiratory Syncytial Virus NOT DETECTED NOT DETECTED Final   Bordetella pertussis NOT DETECTED NOT DETECTED Final   Bordetella Parapertussis NOT DETECTED NOT DETECTED Final   Chlamydophila pneumoniae NOT DETECTED NOT DETECTED Final   Mycoplasma pneumoniae NOT DETECTED NOT DETECTED Final    Comment: Performed at [facility], [address] [place], [place] [address]  MRSA Next Gen by PCR, Nasal     Status: None   Collection Time: 09/26/23  2:57 PM   Specimen: Nasal Mucosa; Nasal Swab  Result Value Ref Range Status   MRSA by PCR Next Gen NOT DETECTED NOT DETECTED Final    Comment: (NOTE) The GeneXpert MRSA Assay (FDA approved for NASAL specimens only), is one component of a comprehensive MRSA colonization surveillance program. It is not intended to diagnose MRSA infection nor to guide or monitor treatment for MRSA infections. Test  performance is not FDA approved in patients less than 58 years old. Performed at [facility], [address], [place], [place] [address]      Radiology Studies: No results found.   Scheduled Meds:  apixaban  5 mg Oral BID   benzonatate  200 mg Oral TID   Chlorhexidine Gluconate Cloth  6 each Topical Daily   digoxin  0.125 mg Oral Daily   empagliflozin  25 mg Oral Daily   FLUoxetine  40 mg Oral Daily   hydrocortisone sod succinate (SOLU-CORTEF) inj  100 mg Intravenous Q8H   insulin aspart  0-15 Units Subcutaneous TID WC   insulin aspart  0-5 Units Subcutaneous QHS   insulin aspart  5 Units Subcutaneous TID WC   insulin glargine-yfgn  15 Units Subcutaneous Daily   methIMAzole  15 mg Oral TID   pantoprazole  40 mg Oral QHS   potassium chloride  40 mEq Oral Daily   potassium iodide  250 mg Oral BID   pravastatin  40 mg Oral QHS   propranolol  10 mg Oral TID   sodium chloride flush  3 mL Intravenous Q12H   torsemide  40 mg Oral Daily   Continuous Infusions:   LOS: 2 days    Time spent:    Zannie Cove, MD [facility]   09/27/2023, 10:34 AM

## 2023-09-27 NOTE — Progress Notes (Signed)
Electrophysiology Progress Note  Patient Name: William [name]. Date of Encounter: 09/27/2023  Primary Cardiologist: Julien Nordmann, MD    Subjective   Does not feel any better today. A little frustrated unable to appreciate any progress in his course.  Inpatient Medications    Scheduled Meds:  apixaban  5 mg Oral BID   benzonatate  200 mg Oral TID   Chlorhexidine Gluconate Cloth  6 each Topical Daily   digoxin  0.125 mg Oral Daily   empagliflozin  25 mg Oral Daily   FLUoxetine  40 mg Oral Daily   hydrocortisone sod succinate (SOLU-CORTEF) inj  100 mg Intravenous Q8H   insulin aspart  0-15 Units Subcutaneous TID WC   insulin aspart  0-5 Units Subcutaneous QHS   insulin aspart  5 Units Subcutaneous TID WC   insulin glargine-yfgn  15 Units Subcutaneous Daily   methIMAzole  15 mg Oral TID   pantoprazole  40 mg Oral QHS   potassium chloride  40 mEq Oral Daily   potassium iodide  250 mg Oral BID   pravastatin  40 mg Oral QHS   propranolol  10 mg Oral TID   sodium chloride flush  3 mL Intravenous Q12H   torsemide  40 mg Oral Daily   Continuous Infusions:  PRN Meds: acetaminophen, guaiFENesin-dextromethorphan, ipratropium, menthol-cetylpyridinium, ondansetron (ZOFRAN) IV, sodium chloride flush, zolpidem   Vital Signs    Vitals:   09/27/23 0500 09/27/23 0530 09/27/23 0600 09/27/23 0740  BP: (!) 84/68  98/74   Pulse: (!) 115 (!) 107 (!) 118   Resp: (!) 21 20 (!) 23   Temp:    97.9 F (36.6 C)  TempSrc:    Oral  SpO2: 95% 94% 96%   Weight: 101 kg     Height:        Intake/Output Summary (Last 24 hours) at 09/27/2023 0803 Last data filed at 09/27/2023 0600 Gross per 24 hour  Intake 1440 ml  Output --  Net 1440 ml   Filed Weights   09/25/23 1633 09/26/23 0500 09/27/23 0500  Weight: 102.3 kg 101.6 kg 101 kg    Physical Exam    GEN- The patient is well appearing, alert and oriented x 3 today.   Lungs- Clear to ausculation bilaterally, normal work of  breathing Heart- Regular rate and rhythm, no murmurs, rubs or gallops Extremities- no clubbing, cyanosis, or edema Neuro- strength and sensation are intact  Labs      Telemetry    AF with RVR (personally reviewed)  Radiology    No results found.   Patient Profile     William [name]. is a 68 y.o. male with a past medical history significant for chronic CHFrEF.    He was admitted for acute CHF with AF and RVR, LBBB in the setting of thyroid storm.   Assessment & Plan     Persistent atrial fibrillation Refractory to cardioversion in thyroid storm Rates uncontrolled despite propranolol, limited by hypotension Perpetuated by thyrotoxicosis No safe option for pharmacologic rhythm control Plan for re-attempt of cardioversion early next week after thyrotoxicosis better managed If thyrotoxicosis is felt to be controlled, yet patient remains refractory to cardioversion, consider switching from propranolol to metoprolol for better HR control with limitations of hypotension   Thyrotoxicosis On methimazole, potassium iodide Management per primary in consultation with endocrinology   LBBB EF 40-45% most recently as 12/2022 despite LBBB He does not meet usual criteria for CRT presently   Acute  on chronic CHFrEF EF 20-25% Thyrotoxicosis also driving high demand state   York Pellant MD 09/27/2023 8:03 AM   For questions or updates, please contact [facility] Please consult [email or web address] for contact info under Cardiology/STEMI.  Signed, Maurice Small, MD  09/27/2023, 8:03 AM

## 2023-09-27 NOTE — Progress Notes (Signed)
Patient ID: William Dy., male   DOB: September 06, 1956, 68 y.o.   MRN: [id number]     Advanced Heart Failure Rounding Note  Cardiologist: Julien Nordmann, MD  Chief Complaint: A fib RVR /Hyperthyroidism  Subjective:    1/16 Transfer from [facility] to [facility] for thyroid storm/A fib management. Continued on  methimazole and hydrocortisone IV and started on potassium iodine drops.   Still in AF with rate 120s this morning.  No dyspnea at rest or walking in room.  SBP generally 90s.  No labs yet this morning.   Objective:   Weight Range: 101 kg Body mass index is 31.06 kg/m.   Vital Signs:   Temp:  [96.7 F (35.9 C)-97.8 F (36.6 C)] 97.8 F (36.6 C) (01/18 0006) Pulse Rate:  [94-148] 118 (01/18 0600) Resp:  [11-26] 23 (01/18 0600) BP: (76-106)/(50-80) 98/74 (01/18 0600) SpO2:  [89 %-99 %] 96 % (01/18 0600) Weight:  [101 kg] 101 kg (01/18 0500) Last BM Date : 09/26/23  Weight change: Filed Weights   09/25/23 1633 09/26/23 0500 09/27/23 0500  Weight: 102.3 kg 101.6 kg 101 kg    Intake/Output:   Intake/Output Summary (Last 24 hours) at 09/27/2023 0724 Last data filed at 09/27/2023 0600 Gross per 24 hour  Intake 1440 ml  Output --  Net 1440 ml      Physical Exam    General: NAD Neck: No JVD, no thyromegaly or thyroid nodule.  Lungs: Clear to auscultation bilaterally with normal respiratory effort. CV: Nondisplaced PMI.  Heart tachy, irregular S1/S2, no S3/S4, 2/6 SEM RUSB.  No peripheral edema.    Abdomen: Soft, nontender, no hepatosplenomegaly, no distention.  Skin: Intact without lesions or rashes.  Neurologic: Alert and oriented x 3.  Psych: Normal affect. Extremities: No clubbing or cyanosis.  HEENT: Normal.   Telemetry   A fib 110-130s (personally reviewed)  EKG    N/A  Labs    CBC Recent Labs    09/25/23 2130  WBC 9.7  NEUTROABS 6.5  HGB 12.7*  HCT 39.0  MCV 84.8  PLT 300   Basic Metabolic Panel Recent Labs    40/98/11 0649 09/25/23 2130  09/26/23 0211  NA 137 131* 135  K 3.3* 3.8 3.6  CL 93* 93* 95*  CO2 28 27 27   GLUCOSE 196* 307* 162*  BUN 45* 53* 50*  CREATININE 0.99 1.51* 1.32*  CALCIUM 8.8* 8.3* 8.6*  MG 2.8* 2.5*  --    Liver Function Tests Recent Labs    09/25/23 1139 09/25/23 2130  AST 25 21  ALT 24 19  ALKPHOS 76 65  BILITOT 0.9 0.5  PROT 7.0 5.8*  ALBUMIN 3.6 2.9*   No results for input(s): "LIPASE", "AMYLASE" in the last 72 hours. Cardiac Enzymes No results for input(s): "CKTOTAL", "CKMB", "CKMBINDEX", "TROPONINI" in the last 72 hours.  BNP: BNP (last 3 results) Recent Labs    08/19/23 2232 09/17/23 0444  BNP 1,995.5* 2,499.1*    ProBNP (last 3 results) No results for input(s): "PROBNP" in the last 8760 hours.   D-Dimer No results for input(s): "DDIMER" in the last 72 hours. Hemoglobin A1C Recent Labs    09/25/23 2130  HGBA1C 7.0*   Fasting Lipid Panel No results for input(s): "CHOL", "HDL", "LDLCALC", "TRIG", "CHOLHDL", "LDLDIRECT" in the last 72 hours. Thyroid Function Tests Recent Labs    09/25/23 0649  TSH <0.010*    Other results:   Imaging    No results found.   Medications:  Scheduled Medications:  apixaban  5 mg Oral BID   benzonatate  200 mg Oral TID   Chlorhexidine Gluconate Cloth  6 each Topical Daily   digoxin  0.125 mg Oral Daily   empagliflozin  25 mg Oral Daily   FLUoxetine  40 mg Oral Daily   hydrocortisone sod succinate (SOLU-CORTEF) inj  100 mg Intravenous Q8H   insulin aspart  0-15 Units Subcutaneous TID WC   insulin aspart  0-5 Units Subcutaneous QHS   insulin aspart  5 Units Subcutaneous TID WC   insulin glargine-yfgn  15 Units Subcutaneous Daily   methIMAzole  15 mg Oral TID   pantoprazole  40 mg Oral QHS   potassium iodide  250 mg Oral BID   pravastatin  40 mg Oral QHS   propranolol  10 mg Oral TID   sodium chloride flush  3 mL Intravenous Q12H   torsemide  40 mg Oral Daily    Infusions:    PRN  Medications: acetaminophen, guaiFENesin-dextromethorphan, ipratropium, menthol-cetylpyridinium, ondansetron (ZOFRAN) IV, sodium chloride flush, zolpidem    Patient Profile  68 y.o. male with history of PAF, chronic systolic CHF, hyperthyroidism, DM II, AS.  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    Assessment/Plan   1. 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  He was taking a lower dose of methimazole than prescribed and also did not get prednisone at home.  He had prior episode of AF in 2021, had been on amiodarone since that time.  Amiodarone was stopped in 12/24 with type 2 AIT.  Needs to get back into NSR though antiarrhythmic options are limited.  Would avoid amiodarone use unless absolutely necessary, and with persistently long QTc,  do not think that he will be able to take Tikosyn. Discussed Tikosyn with EP but QT long and felt to be high risk. TEE/DCCV 1/14 failed conversion x3. EP consulted 09/24/23, recommended transfer to [facility] for inpatient endocrinology consult. No beds available.  Remains in AF with RVR today.  - Continue digoxin for rate control, check level today.   - Continue propranolol 10 mg TID- BP soft. No room to titrate.  - Continue Eliquis 5 mg bid.  - Continue hyperthyroidism treatment.    - Attempt repeat DCCV Monday.  - Ultimately, he should be evaluated for AF ablation.  2. 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 Thyroid stimulating antibodies were negative in 12/24, less likely type 1. He was taking lower than prescribed dose of methimazole at home and never got prednisone after last discharge.  - Continue Methimazole 15 mg tid.  - Prednisone restarted 1/15 for type II AIT, now has been switched to IV hydrocortisone.  Continue hydrocortisone today, back to prednisone probably tomorrow.  - Continue  propranolol 10 mg TID. BP soft. No room to increased.  - On 1/16 potassium iodide drops 250 mg BID started, can stop after today. - Check free T4/free T3 daily.    3. Acute on chronic systolic CHF: Presumed nonischemic cardiomyopathy, ?tachycardia-mediated with AF/RVR.  Echo in 3/21 with EF < 20%.  He was in atrial fibrillation with RVR at that time, converted to NSR on amiodarone.  Slow improvement in EF over time, echo in 4/24 with EF 40-45%, moderate aortic stenosis.  Recurrent AF/RVR in 12/24, echo with EF 20-25% with normal RV, mild-moderate MR, moderate AS with mean gradient 16 mmHg. Suspect fall  in LV systolic function was tachycardia-mediated. He remains in AF/RVR as noted above.   Volume status looks ok today after increasing torsemide yesterday.  No BP room to titrate meds.  - Continue torsemide 40 mg daily, waiting for BMET.   - Continue digoxin 0.125. Check level today.  - Continue propranolol 10 mg TID - Continue Jardiance.  - If EF does not improve significantly in NSR, consider right/left heart cath in future.  4. Aortic stenosis: Moderate on echo this admission.  5. DM2: Watch glucose with steroid use. Continue SSI.   Mobilize.    Length of Stay: 2  Marca Ancona, MD  09/27/2023, 7:24 AM  Advanced Heart Failure Team Pager [phone] (M-F; 7a - 5p)  Please contact [facility] for night-coverage after hours (5p -7a ) and weekends on [email or web address]

## 2023-09-28 DIAGNOSIS — I5023 Acute on chronic systolic (congestive) heart failure: Secondary | ICD-10-CM | POA: Diagnosis not present

## 2023-09-28 LAB — BASIC METABOLIC PANEL
Anion gap: 11 (ref 5–15)
BUN: 43 mg/dL — ABNORMAL HIGH (ref 8–23)
CO2: 28 mmol/L (ref 22–32)
Calcium: 8.4 mg/dL — ABNORMAL LOW (ref 8.9–10.3)
Chloride: 98 mmol/L (ref 98–111)
Creatinine, Ser: 1.11 mg/dL (ref 0.61–1.24)
GFR, Estimated: >60 mL/min (ref >60)
Glucose, Bld: 209 mg/dL — ABNORMAL HIGH (ref 70–99)
Potassium: 2.9 mmol/L — ABNORMAL LOW (ref 3.5–5.1)
Sodium: 137 mmol/L (ref 135–145)

## 2023-09-28 LAB — T4, FREE: Free T4: 4.72 ng/dL — ABNORMAL HIGH (ref 0.61–1.12)

## 2023-09-28 LAB — GLUCOSE, CAPILLARY
Glucose-Capillary: 168 mg/dL — ABNORMAL HIGH (ref 70–99)
Glucose-Capillary: 194 mg/dL — ABNORMAL HIGH (ref 70–99)
Glucose-Capillary: 129 mg/dL — ABNORMAL HIGH (ref 70–99)
Glucose-Capillary: 250 mg/dL — ABNORMAL HIGH (ref 70–99)

## 2023-09-28 MED ORDER — POTASSIUM CHLORIDE CRYS ER 20 MEQ PO TBCR
40.0000 meq | EXTENDED_RELEASE_TABLET | Freq: Once | ORAL | Status: AC
  Administered 2023-09-28: 40 meq via ORAL
  Filled 2023-09-28: qty 2

## 2023-09-28 MED ORDER — POTASSIUM CHLORIDE CRYS ER 20 MEQ PO TBCR
40.0000 meq | EXTENDED_RELEASE_TABLET | Freq: Every day | ORAL | Status: DC
  Administered 2023-09-29 – 2023-09-30 (×2): 40 meq via ORAL
  Filled 2023-09-28 (×3): qty 2

## 2023-09-28 MED ORDER — ORAL CARE MOUTH RINSE: 15.0000 mL | OROMUCOSAL | Status: DC | PRN

## 2023-09-28 MED ORDER — INSULIN GLARGINE-YFGN 100 UNIT/ML ~~LOC~~ SOLN
20.0000 [IU] | Freq: Every day | SUBCUTANEOUS | Status: DC
  Administered 2023-09-28 – 2023-09-29 (×2): 20 [IU] via SUBCUTANEOUS
  Filled 2023-09-28 (×2): qty 0.2

## 2023-09-28 NOTE — Progress Notes (Signed)
PROGRESS NOTE    Neal Dy.  ZOX:[id number] DOB: 06-13-1956 DOA: 09/25/2023 PCP: Lynnea Ferrier, MD  68/M diagnosed with CHF and A-fib RVR 3/21, EF<20%, he was cardioverted and started on amiodarone, subsequently EF improved to 40-45%. -Subsequently admitted 08/20/2023 to [facility] with A-fib RVR and CHF in the setting of amiodarone induced thyroid toxicity, echo noted EF of 20-25%, moderate aortic stenosis, started on prednisone and methimazole, spontaneously converted to sinus rhythm and discharged home, apparently quickly ran out of prednisone and pharmacy dispensed 5 Mg 3 times daily of methimazole instead of 15 Mg 3 times daily. -Readmitted to [facility] 09/17/2023 with A-fib RVR acute on chronic systolic CHF, thyroid storm, restarted on methimazole 15 Mg 3 times daily, prednisone 40 Mg daily> switched to IV hydrocortisone 1/16, propranolol, transfer requested to [facility] for endocrinology evaluation for several days however [facility] at capacity, eventually transferred to [facility] 1/16  Subjective: -Feels a little better today  Assessment and Plan:  Amiodarone induced thyrotoxicosis, thyroid storm, Type 2 -Amiodarone discontinued last admission 4 weeks ago -Suspect current flare related to interruption of recent steroid therapy -TSH undetectable, free T3 and T4 remain elevated since last week -Thyroid-stimulating immunoglobulin and thyroid peroxidase antibodies negative now and back in 09-28-24 -I called and reviewed case with local endocrinologist 1/17 -Continue methimazole 15 Mg 3 times daily -Propranolol 10 Mg 3 times daily -Potassium iodine X 2 days-completed -IV hydrocortisone 100mg  TID X 1 more day then changed to prednisone -Mild improvement in free T4, continue to trend   Hyperglycemia Secondary to high-dose IV steroids -A1c is 7.0 (suspect he has steroid-induced hyperglycemia rather than diabetes), increase glargine, continue meal coverage NovoLog -Will need to cut down dose when IV  steroids tapered   Afib RVR -Triggered by thyrotoxicosis, management as above, on digoxin, propranolol, Eliquis   Acute on chronic systolic CHF -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 -Now on oral torsemide, digoxin, Jardiance   Moderate aortic stenosis   DVT prophylaxis: Apixaban Code Status: Full code Family Communication: None present Disposition Plan:   Consultants:    Procedures:   Antimicrobials:    Objective: Vitals:   09/28/23 0500 09/28/23 0700 09/28/23 0800 09/28/23 0900  BP:  98/70 100/66 95/81  Pulse:  (!) 119 (!) 118 (!) 110  Resp:  18 16 18   Temp:      TempSrc:      SpO2:  93% 96% 94%  Weight: 101.4 kg     Height:        Intake/Output Summary (Last 24 hours) at 09/28/2023 1002 Last data filed at 09/27/2023 1700 Gross per 24 hour  Intake 480 ml  Output --  Net 480 ml   Filed Weights   09/26/23 0500 09/27/23 0500 09/28/23 0500  Weight: 101.6 kg 101 kg 101.4 kg    Examination:  General exam: Frail chronically ill male sitting up in the recliner, AAOx3 HEENT: No JVD CVS: S1-S2, irregular rhythm, systolic murmur Lungs: Clear Abdomen: Soft, nontender, bowel sounds present Remedies: No edema Skin: No rashes Psychiatry:  Mood & affect appropriate.     Data Reviewed:   CBC: Recent Labs  Lab 09/22/23 0522 09/23/23 0436 09/25/23 2130  WBC 7.9 9.2 9.7  NEUTROABS  --   --  6.5  HGB 12.4* 12.4* 12.7*  HCT 37.1* 37.4* 39.0  MCV 83.7 84.8 84.8  PLT 234 257 300   Basic Metabolic Panel: Recent Labs  Lab 09/23/23 0436 09/24/23 0439 09/25/23 0649 09/25/23 2130 09/26/23 0211 09/27/23 0734 09/28/23  0304  NA 136 136 137 131* 135 135 137  K 3.1* 3.4* 3.3* 3.8 3.6 3.6 2.9*  CL 94* 93* 93* 93* 95* 94* 98  CO2 31 30 28 27 27 29 28   GLUCOSE 145* 241* 196* 307* 162* 238* 209*  BUN 39* 40* 45* 53* 50* 45* 43*  CREATININE 0.93 1.03 0.99 1.51* 1.32* 1.24 1.11  CALCIUM 8.5* 8.5* 8.8* 8.3* 8.6* 8.7* 8.4*  MG 2.5* 2.5* 2.8* 2.5*  --   --    --    GFR: Estimated Creatinine Clearance: 79.4 mL/min (by C-G formula based on SCr of 1.11 mg/dL). Liver Function Tests: Recent Labs  Lab 09/25/23 1139 09/25/23 2130  AST 25 21  ALT 24 19  ALKPHOS 76 65  BILITOT 0.9 0.5  PROT 7.0 5.8*  ALBUMIN 3.6 2.9*   No results for input(s): "LIPASE", "AMYLASE" in the last 168 hours. No results for input(s): "AMMONIA" in the last 168 hours. Coagulation Profile: No results for input(s): "INR", "PROTIME" in the last 168 hours. Cardiac Enzymes: No results for input(s): "CKTOTAL", "CKMB", "CKMBINDEX", "TROPONINI" in the last 168 hours. BNP (last 3 results) No results for input(s): "PROBNP" in the last 8760 hours. HbA1C: Recent Labs    09/25/23 2130  HGBA1C 7.0*   CBG: Recent Labs  Lab 09/27/23 0557 09/27/23 1119 09/27/23 1628 09/27/23 2146 09/28/23 0553  GLUCAP 174* 299* 116* 227* 168*   Lipid Profile: No results for input(s): "CHOL", "HDL", "LDLCALC", "TRIG", "CHOLHDL", "LDLDIRECT" in the last 72 hours. Thyroid Function Tests: Recent Labs    09/28/23 0304  FREET4 4.72*   Anemia Panel: No results for input(s): "VITAMINB12", "FOLATE", "FERRITIN", "TIBC", "IRON", "RETICCTPCT" in the last 72 hours. Urine analysis:    Component Value Date/Time   COLORURINE YELLOW (A) 08/07/2016 1337   APPEARANCEUR CLEAR (A) 08/07/2016 1337   LABSPEC 1.020 08/07/2016 1337   PHURINE 5.0 08/07/2016 1337   GLUCOSEU NEGATIVE 08/07/2016 1337   HGBUR NEGATIVE 08/07/2016 1337   BILIRUBINUR NEGATIVE 08/07/2016 1337   KETONESUR NEGATIVE 08/07/2016 1337   PROTEINUR NEGATIVE 08/07/2016 1337   NITRITE NEGATIVE 08/07/2016 1337   LEUKOCYTESUR NEGATIVE 08/07/2016 1337   Sepsis Labs: @LABRCNTIP (procalcitonin:4,lacticidven:4)  ) Recent Results (from the past 240 hours)  Respiratory (~20 pathogens) panel by PCR     Status: None   Collection Time: 09/19/23  6:50 PM   Specimen: Nasopharyngeal Swab; Respiratory  Result Value Ref Range Status    Adenovirus NOT DETECTED NOT DETECTED Final   Coronavirus 229E NOT DETECTED NOT DETECTED Final    Comment: (NOTE) The Coronavirus on the Respiratory Panel, DOES NOT test for the novel  Coronavirus (2019 nCoV)    Coronavirus HKU1 NOT DETECTED NOT DETECTED Final   Coronavirus NL63 NOT DETECTED NOT DETECTED Final   Coronavirus OC43 NOT DETECTED NOT DETECTED Final   Metapneumovirus NOT DETECTED NOT DETECTED Final   Rhinovirus / Enterovirus NOT DETECTED NOT DETECTED Final   Influenza A NOT DETECTED NOT DETECTED Final   Influenza B NOT DETECTED NOT DETECTED Final   Parainfluenza Virus 1 NOT DETECTED NOT DETECTED Final   Parainfluenza Virus 2 NOT DETECTED NOT DETECTED Final   Parainfluenza Virus 3 NOT DETECTED NOT DETECTED Final   Parainfluenza Virus 4 NOT DETECTED NOT DETECTED Final   Respiratory Syncytial Virus NOT DETECTED NOT DETECTED Final   Bordetella pertussis NOT DETECTED NOT DETECTED Final   Bordetella Parapertussis NOT DETECTED NOT DETECTED Final   Chlamydophila pneumoniae NOT DETECTED NOT DETECTED Final   Mycoplasma pneumoniae NOT  DETECTED NOT DETECTED Final    Comment: Performed at [facility], [address] [place], [place] [address]  MRSA Next Gen by PCR, Nasal     Status: None   Collection Time: 09/26/23  2:57 PM   Specimen: Nasal Mucosa; Nasal Swab  Result Value Ref Range Status   MRSA by PCR Next Gen NOT DETECTED NOT DETECTED Final    Comment: (NOTE) The GeneXpert MRSA Assay (FDA approved for NASAL specimens only), is one component of a comprehensive MRSA colonization surveillance program. It is not intended to diagnose MRSA infection nor to guide or monitor treatment for MRSA infections. Test performance is not FDA approved in patients less than 50 years old. Performed at [facility], [address], [place], [place] [address]      Radiology Studies: No results found.   Scheduled Meds:  apixaban  5 mg Oral BID   benzonatate  200 mg Oral TID    Chlorhexidine Gluconate Cloth  6 each Topical Daily   digoxin  0.125 mg Oral Daily   empagliflozin  25 mg Oral Daily   FLUoxetine  40 mg Oral Daily   hydrocortisone sod succinate (SOLU-CORTEF) inj  100 mg Intravenous Q8H   insulin aspart  0-15 Units Subcutaneous TID WC   insulin aspart  0-5 Units Subcutaneous QHS   insulin aspart  5 Units Subcutaneous TID WC   insulin glargine-yfgn  20 Units Subcutaneous Daily   methIMAzole  15 mg Oral TID   pantoprazole  40 mg Oral QHS   [START ON 09/29/2023] potassium chloride  40 mEq Oral Daily   potassium chloride  40 mEq Oral Once   pravastatin  40 mg Oral QHS   propranolol  10 mg Oral TID   sodium chloride flush  3 mL Intravenous Q12H   torsemide  40 mg Oral Daily   Continuous Infusions:   LOS: 3 days    Time spent:    Zannie Cove, MD [facility]   09/28/2023, 10:02 AM

## 2023-09-28 NOTE — H&P (View-Only) (Signed)
Patient ID: William Dy., male   DOB: Mar 22, 1956, 68 y.o.   MRN: [id number]     Advanced Heart Failure Rounding Note  Cardiologist: Julien Nordmann, MD  Chief Complaint: A fib RVR /Hyperthyroidism  Subjective:    1/16 Transfer from [facility] to [facility] for thyroid storm/A fib management. Continued on  methimazole and hydrocortisone IV and started on potassium iodine drops.   In AF, rate 100s today.  SBP generally 90s.  Free T4 now trending down.  Remains on IV hydrocortisone.    No dyspnea.   Objective:   Weight Range: 101.4 kg Body mass index is 31.18 kg/m.   Vital Signs:   Temp:  [97.9 F (36.6 C)-98.1 F (36.7 C)] 98.1 F (36.7 C) (01/18 2000) Pulse Rate:  [96-254] 111 (01/19 0400) Resp:  [16-32] 18 (01/19 0400) BP: (85-109)/(58-83) 97/68 (01/19 0300) SpO2:  [83 %-99 %] 83 % (01/19 0400) Weight:  [101.4 kg] 101.4 kg (01/19 0500) Last BM Date : 09/27/23  Weight change: Filed Weights   09/26/23 0500 09/27/23 0500 09/28/23 0500  Weight: 101.6 kg 101 kg 101.4 kg    Intake/Output:   Intake/Output Summary (Last 24 hours) at 09/28/2023 0728 Last data filed at 09/27/2023 1700 Gross per 24 hour  Intake 720 ml  Output --  Net 720 ml      Physical Exam    General: NAD Neck: No JVD, no thyromegaly or thyroid nodule.  Lungs: Clear to auscultation bilaterally with normal respiratory effort. CV: Nondisplaced PMI.  Heart tachy, irregular S1/S2, no S3/S4, 2/6 SEM RUSB.  No peripheral edema.    Abdomen: Soft, nontender, no hepatosplenomegaly, no distention.  Skin: Intact without lesions or rashes.  Neurologic: Alert and oriented x 3.  Psych: Normal affect. Extremities: No clubbing or cyanosis.  HEENT: Normal.   Telemetry   A fib 100s (personally reviewed)  EKG    N/A  Labs    CBC Recent Labs    09/25/23 2130  WBC 9.7  NEUTROABS 6.5  HGB 12.7*  HCT 39.0  MCV 84.8  PLT 300   Basic Metabolic Panel Recent Labs    40/98/11 2130 09/26/23 0211 09/27/23 0734  09/28/23 0304  NA 131*   < > 135 137  K 3.8   < > 3.6 2.9*  CL 93*   < > 94* 98  CO2 27   < > 29 28  GLUCOSE 307*   < > 238* 209*  BUN 53*   < > 45* 43*  CREATININE 1.51*   < > 1.24 1.11  CALCIUM 8.3*   < > 8.7* 8.4*  MG 2.5*  --   --   --    < > = values in this interval not displayed.   Liver Function Tests Recent Labs    09/25/23 1139 09/25/23 2130  AST 25 21  ALT 24 19  ALKPHOS 76 65  BILITOT 0.9 0.5  PROT 7.0 5.8*  ALBUMIN 3.6 2.9*   No results for input(s): "LIPASE", "AMYLASE" in the last 72 hours. Cardiac Enzymes No results for input(s): "CKTOTAL", "CKMB", "CKMBINDEX", "TROPONINI" in the last 72 hours.  BNP: BNP (last 3 results) Recent Labs    08/19/23 2232 09/17/23 0444  BNP 1,995.5* 2,499.1*    ProBNP (last 3 results) No results for input(s): "PROBNP" in the last 8760 hours.   D-Dimer No results for input(s): "DDIMER" in the last 72 hours. Hemoglobin A1C Recent Labs    09/25/23 2130  HGBA1C 7.0*   Fasting  Lipid Panel No results for input(s): "CHOL", "HDL", "LDLCALC", "TRIG", "CHOLHDL", "LDLDIRECT" in the last 72 hours. Thyroid Function Tests No results for input(s): "TSH", "T4TOTAL", "T3FREE", "THYROIDAB" in the last 72 hours.  Invalid input(s): "FREET3"   Other results:   Imaging    No results found.   Medications:     Scheduled Medications:  apixaban  5 mg Oral BID   benzonatate  200 mg Oral TID   Chlorhexidine Gluconate Cloth  6 each Topical Daily   digoxin  0.125 mg Oral Daily   empagliflozin  25 mg Oral Daily   FLUoxetine  40 mg Oral Daily   hydrocortisone sod succinate (SOLU-CORTEF) inj  100 mg Intravenous Q8H   insulin aspart  0-15 Units Subcutaneous TID WC   insulin aspart  0-5 Units Subcutaneous QHS   insulin aspart  5 Units Subcutaneous TID WC   insulin glargine-yfgn  20 Units Subcutaneous Daily   methIMAzole  15 mg Oral TID   pantoprazole  40 mg Oral QHS   potassium chloride  40 mEq Oral Once   [START ON  09/29/2023] potassium chloride  40 mEq Oral Daily   potassium chloride  40 mEq Oral Once   pravastatin  40 mg Oral QHS   propranolol  10 mg Oral TID   sodium chloride flush  3 mL Intravenous Q12H   torsemide  40 mg Oral Daily    Infusions:    PRN Medications: acetaminophen, guaiFENesin-dextromethorphan, ipratropium, menthol-cetylpyridinium, ondansetron (ZOFRAN) IV, sodium chloride flush, zolpidem    Patient Profile  68 y.o. male with history of PAF, chronic systolic CHF, hyperthyroidism, DM II, AS.  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    Assessment/Plan   1. 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  He was taking a lower dose of methimazole than prescribed and also did not get prednisone at home.  He had prior episode of AF in 2021, had been on amiodarone since that time.  Amiodarone was stopped in 12/24 with type 2 AIT.  Needs to get back into NSR though antiarrhythmic options are limited.  Would avoid amiodarone use unless absolutely necessary, and with persistently long QTc,  do not think that he will be able to take Tikosyn. Discussed Tikosyn with EP but QT long and felt to be high risk. TEE/DCCV 1/14 failed conversion x3. EP consulted 09/24/23, recommended transfer to [facility] for inpatient endocrinology consult. No beds available.  Remains in AF with RVR today, HR generally 100s. Free T4 is now trending down.  - Continue digoxin for rate control, level ok yesterday.   - Continue propranolol 10 mg TID- BP soft. No room to titrate.  - Continue Eliquis 5 mg bid.  - Continue hyperthyroidism treatment.    - Attempt repeat DCCV Monday, discussed risks/benefits and he agrees to  procedure. .  - Ultimately, he should be evaluated for AF ablation.  2. 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 Thyroid stimulating antibodies were  negative in 12/24, less likely type 1. He was taking lower than prescribed dose of methimazole at home and never got prednisone after last discharge.  Free T4 now trending down with treatment.  - Continue Methimazole 15 mg tid.  - Prednisone restarted 1/15 for type II AIT, now has been switched to IV hydrocortisone.  Continue hydrocortisone for now, transition to prednisone after DCCV.  - Continue propranolol 10 mg TID.  No BP room to increased.  - Got potassium iodide, stopped after yesterday. - Check free T4/free T3 daily.    3. Acute on chronic systolic CHF: Presumed nonischemic cardiomyopathy, ?tachycardia-mediated with AF/RVR.  Echo in 3/21 with EF < 20%.  He was in atrial fibrillation with RVR at that time, converted to NSR on amiodarone.  Slow improvement in EF over time, echo in 4/24 with EF 40-45%, moderate aortic stenosis.  Recurrent AF/RVR in 12/24, echo with EF 20-25% with normal RV, mild-moderate MR, moderate AS with mean gradient 16 mmHg. Suspect fall in LV systolic function was tachycardia-mediated. He remains in AF/RVR as noted above.   Volume status looks ok today.  No BP room to titrate meds.  - Continue torsemide 40 mg daily, replace K. - Continue digoxin 0.125. Level good yesterday.   - Continue propranolol 10 mg TID - Continue Jardiance.  - If EF does not improve significantly in NSR, consider right/left heart cath in future.  4. Aortic stenosis: Moderate on echo this admission.  5. DM2: Watch glucose with steroid use. Continue SSI.   Mobilize.    Length of Stay: 3  Marca Ancona, MD  09/28/2023, 7:28 AM  Advanced Heart Failure Team Pager [phone] (M-F; 7a - 5p)  Please contact [facility] for night-coverage after hours (5p -7a ) and weekends on [email or web address]

## 2023-09-28 NOTE — Progress Notes (Signed)
Patient ID: William Dy., male   DOB: Mar 22, 1956, 68 y.o.   MRN: [id number]     Advanced Heart Failure Rounding Note  Cardiologist: Julien Nordmann, MD  Chief Complaint: A fib RVR /Hyperthyroidism  Subjective:    1/16 Transfer from [facility] to [facility] for thyroid storm/A fib management. Continued on  methimazole and hydrocortisone IV and started on potassium iodine drops.   In AF, rate 100s today.  SBP generally 90s.  Free T4 now trending down.  Remains on IV hydrocortisone.    No dyspnea.   Objective:   Weight Range: 101.4 kg Body mass index is 31.18 kg/m.   Vital Signs:   Temp:  [97.9 F (36.6 C)-98.1 F (36.7 C)] 98.1 F (36.7 C) (01/18 2000) Pulse Rate:  [96-254] 111 (01/19 0400) Resp:  [16-32] 18 (01/19 0400) BP: (85-109)/(58-83) 97/68 (01/19 0300) SpO2:  [83 %-99 %] 83 % (01/19 0400) Weight:  [101.4 kg] 101.4 kg (01/19 0500) Last BM Date : 09/27/23  Weight change: Filed Weights   09/26/23 0500 09/27/23 0500 09/28/23 0500  Weight: 101.6 kg 101 kg 101.4 kg    Intake/Output:   Intake/Output Summary (Last 24 hours) at 09/28/2023 0728 Last data filed at 09/27/2023 1700 Gross per 24 hour  Intake 720 ml  Output --  Net 720 ml      Physical Exam    General: NAD Neck: No JVD, no thyromegaly or thyroid nodule.  Lungs: Clear to auscultation bilaterally with normal respiratory effort. CV: Nondisplaced PMI.  Heart tachy, irregular S1/S2, no S3/S4, 2/6 SEM RUSB.  No peripheral edema.    Abdomen: Soft, nontender, no hepatosplenomegaly, no distention.  Skin: Intact without lesions or rashes.  Neurologic: Alert and oriented x 3.  Psych: Normal affect. Extremities: No clubbing or cyanosis.  HEENT: Normal.   Telemetry   A fib 100s (personally reviewed)  EKG    N/A  Labs    CBC Recent Labs    09/25/23 2130  WBC 9.7  NEUTROABS 6.5  HGB 12.7*  HCT 39.0  MCV 84.8  PLT 300   Basic Metabolic Panel Recent Labs    40/98/11 2130 09/26/23 0211 09/27/23 0734  09/28/23 0304  NA 131*   < > 135 137  K 3.8   < > 3.6 2.9*  CL 93*   < > 94* 98  CO2 27   < > 29 28  GLUCOSE 307*   < > 238* 209*  BUN 53*   < > 45* 43*  CREATININE 1.51*   < > 1.24 1.11  CALCIUM 8.3*   < > 8.7* 8.4*  MG 2.5*  --   --   --    < > = values in this interval not displayed.   Liver Function Tests Recent Labs    09/25/23 1139 09/25/23 2130  AST 25 21  ALT 24 19  ALKPHOS 76 65  BILITOT 0.9 0.5  PROT 7.0 5.8*  ALBUMIN 3.6 2.9*   No results for input(s): "LIPASE", "AMYLASE" in the last 72 hours. Cardiac Enzymes No results for input(s): "CKTOTAL", "CKMB", "CKMBINDEX", "TROPONINI" in the last 72 hours.  BNP: BNP (last 3 results) Recent Labs    08/19/23 2232 09/17/23 0444  BNP 1,995.5* 2,499.1*    ProBNP (last 3 results) No results for input(s): "PROBNP" in the last 8760 hours.   D-Dimer No results for input(s): "DDIMER" in the last 72 hours. Hemoglobin A1C Recent Labs    09/25/23 2130  HGBA1C 7.0*   Fasting  Lipid Panel No results for input(s): "CHOL", "HDL", "LDLCALC", "TRIG", "CHOLHDL", "LDLDIRECT" in the last 72 hours. Thyroid Function Tests No results for input(s): "TSH", "T4TOTAL", "T3FREE", "THYROIDAB" in the last 72 hours.  Invalid input(s): "FREET3"   Other results:   Imaging    No results found.   Medications:     Scheduled Medications:  apixaban  5 mg Oral BID   benzonatate  200 mg Oral TID   Chlorhexidine Gluconate Cloth  6 each Topical Daily   digoxin  0.125 mg Oral Daily   empagliflozin  25 mg Oral Daily   FLUoxetine  40 mg Oral Daily   hydrocortisone sod succinate (SOLU-CORTEF) inj  100 mg Intravenous Q8H   insulin aspart  0-15 Units Subcutaneous TID WC   insulin aspart  0-5 Units Subcutaneous QHS   insulin aspart  5 Units Subcutaneous TID WC   insulin glargine-yfgn  20 Units Subcutaneous Daily   methIMAzole  15 mg Oral TID   pantoprazole  40 mg Oral QHS   potassium chloride  40 mEq Oral Once   [START ON  09/29/2023] potassium chloride  40 mEq Oral Daily   potassium chloride  40 mEq Oral Once   pravastatin  40 mg Oral QHS   propranolol  10 mg Oral TID   sodium chloride flush  3 mL Intravenous Q12H   torsemide  40 mg Oral Daily    Infusions:    PRN Medications: acetaminophen, guaiFENesin-dextromethorphan, ipratropium, menthol-cetylpyridinium, ondansetron (ZOFRAN) IV, sodium chloride flush, zolpidem    Patient Profile  68 y.o. male with history of PAF, chronic systolic CHF, hyperthyroidism, DM II, AS.  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    Assessment/Plan   1. 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  He was taking a lower dose of methimazole than prescribed and also did not get prednisone at home.  He had prior episode of AF in 2021, had been on amiodarone since that time.  Amiodarone was stopped in 12/24 with type 2 AIT.  Needs to get back into NSR though antiarrhythmic options are limited.  Would avoid amiodarone use unless absolutely necessary, and with persistently long QTc,  do not think that he will be able to take Tikosyn. Discussed Tikosyn with EP but QT long and felt to be high risk. TEE/DCCV 1/14 failed conversion x3. EP consulted 09/24/23, recommended transfer to [facility] for inpatient endocrinology consult. No beds available.  Remains in AF with RVR today, HR generally 100s. Free T4 is now trending down.  - Continue digoxin for rate control, level ok yesterday.   - Continue propranolol 10 mg TID- BP soft. No room to titrate.  - Continue Eliquis 5 mg bid.  - Continue hyperthyroidism treatment.    - Attempt repeat DCCV Monday, discussed risks/benefits and he agrees to  procedure. .  - Ultimately, he should be evaluated for AF ablation.  2. 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 Thyroid stimulating antibodies were  negative in 12/24, less likely type 1. He was taking lower than prescribed dose of methimazole at home and never got prednisone after last discharge.  Free T4 now trending down with treatment.  - Continue Methimazole 15 mg tid.  - Prednisone restarted 1/15 for type II AIT, now has been switched to IV hydrocortisone.  Continue hydrocortisone for now, transition to prednisone after DCCV.  - Continue propranolol 10 mg TID.  No BP room to increased.  - Got potassium iodide, stopped after yesterday. - Check free T4/free T3 daily.    3. Acute on chronic systolic CHF: Presumed nonischemic cardiomyopathy, ?tachycardia-mediated with AF/RVR.  Echo in 3/21 with EF < 20%.  He was in atrial fibrillation with RVR at that time, converted to NSR on amiodarone.  Slow improvement in EF over time, echo in 4/24 with EF 40-45%, moderate aortic stenosis.  Recurrent AF/RVR in 12/24, echo with EF 20-25% with normal RV, mild-moderate MR, moderate AS with mean gradient 16 mmHg. Suspect fall in LV systolic function was tachycardia-mediated. He remains in AF/RVR as noted above.   Volume status looks ok today.  No BP room to titrate meds.  - Continue torsemide 40 mg daily, replace K. - Continue digoxin 0.125. Level good yesterday.   - Continue propranolol 10 mg TID - Continue Jardiance.  - If EF does not improve significantly in NSR, consider right/left heart cath in future.  4. Aortic stenosis: Moderate on echo this admission.  5. DM2: Watch glucose with steroid use. Continue SSI.   Mobilize.    Length of Stay: 3  Marca Ancona, MD  09/28/2023, 7:28 AM  Advanced Heart Failure Team Pager [phone] (M-F; 7a - 5p)  Please contact [facility] for night-coverage after hours (5p -7a ) and weekends on [email or web address]

## 2023-09-28 NOTE — Anesthesia Preprocedure Evaluation (Signed)
Anesthesia Evaluation  Patient identified by MRN, date of birth, ID band Patient awake    Reviewed: Allergy & Precautions, NPO status , Patient's Chart, lab work & pertinent test results  History of Anesthesia Complications Negative for: history of anesthetic complications  Airway Mallampati: II  TM Distance: >3 FB Neck ROM: Full    Dental  (+) Dental Advidsory Given   Pulmonary shortness of breath and with exertion, neg sleep apnea, neg COPD, neg recent URI   breath sounds clear to auscultation- rhonchi (-) wheezing      Cardiovascular hypertension, Pt. on medications and Pt. on home beta blockers (-) angina +CHF  (-) CAD, (-) Past MI and (-) Cardiac Stents + dysrhythmias Atrial Fibrillation + Valvular Problems/Murmurs AS  Rhythm:Regular Rate:Normal - Systolic murmurs and - Diastolic murmurs ECHO 1/25   1. Left ventricular ejection fraction, by estimation, is 20 to 25%. The  left ventricle has severely decreased function. The left ventricle  demonstrates global hypokinesis. The left ventricular internal cavity size  was moderately dilated. Left  ventricular diastolic parameters are indeterminate.   2. Right ventricular systolic function is normal. The right ventricular  size is normal.   3. Left atrial size was moderately dilated.   4. The mitral valve is normal in structure. Mild to moderate mitral valve  regurgitation. No evidence of mitral stenosis.   5. The aortic valve is normal in structure. There is moderate  calcification of the aortic valve. Aortic valve regurgitation is mild.  Moderate aortic valve stenosis. Aortic valve area, by VTI measures 0.97  cm. Aortic valve mean gradient measures 16.0  mmHg. Aortic valve Vmax measures 2.81 m/s.   6. The inferior vena cava is normal in size with greater than 50%  respiratory variability, suggesting right atrial pressure of 3 mmHg.      Neuro/Psych neg Seizures  PSYCHIATRIC DISORDERS Anxiety Depression    negative neurological ROS     GI/Hepatic Neg liver ROS, PUD,GERD  Medicated,,  Endo/Other  diabetes, Type 2, Oral Hypoglycemic AgentsHypothyroidism  Class 3 obesity  Renal/GU negative Renal ROS     Musculoskeletal  (+) Arthritis , Osteoarthritis,    Abdominal  (+) + obese  Peds  Hematology  (+) Blood dyscrasia, anemia   Anesthesia Other Findings   Reproductive/Obstetrics                             Anesthesia Physical Anesthesia Plan  ASA: 3  Anesthesia Plan: General   Post-op Pain Management: Minimal or no pain anticipated   Induction: Intravenous  PONV Risk Score and Plan: 1 and Propofol infusion  Airway Management Planned: Natural Airway, Nasal Cannula and Simple Face Mask  Additional Equipment: None  Intra-op Plan:   Post-operative Plan: Extubation in OR  Informed Consent: I have reviewed the patients History and Physical, chart, labs and discussed the procedure including the risks, benefits and alternatives for the proposed anesthesia with the patient or authorized representative who has indicated his/her understanding and acceptance.     Dental advisory given  Plan Discussed with: CRNA and Anesthesiologist  Anesthesia Plan Comments:         Anesthesia Quick Evaluation

## 2023-09-28 NOTE — Plan of Care (Signed)

## 2023-09-29 ENCOUNTER — Inpatient Hospital Stay (HOSPITAL_COMMUNITY): Payer: Self-pay | Admitting: Anesthesiology

## 2023-09-29 ENCOUNTER — Encounter (HOSPITAL_COMMUNITY)
Admission: EM | Disposition: A | Discharge status: Home / Self Care | Payer: Self-pay | Source: Other Acute Inpatient Hospital | Attending: Cardiology

## 2023-09-29 DIAGNOSIS — I5021 Acute systolic (congestive) heart failure: Secondary | ICD-10-CM

## 2023-09-29 DIAGNOSIS — I5023 Acute on chronic systolic (congestive) heart failure: Secondary | ICD-10-CM | POA: Diagnosis not present

## 2023-09-29 DIAGNOSIS — I4819 Other persistent atrial fibrillation: Secondary | ICD-10-CM | POA: Diagnosis not present

## 2023-09-29 DIAGNOSIS — E039 Hypothyroidism, unspecified: Secondary | ICD-10-CM

## 2023-09-29 DIAGNOSIS — I4891 Unspecified atrial fibrillation: Secondary | ICD-10-CM

## 2023-09-29 DIAGNOSIS — I110 Hypertensive heart disease with heart failure: Secondary | ICD-10-CM

## 2023-09-29 HISTORY — PX: CARDIOVERSION: EP1203

## 2023-09-29 LAB — BASIC METABOLIC PANEL
Anion gap: 9 (ref 5–15)
Anion gap: 12 (ref 5–15)
BUN: 39 mg/dL — ABNORMAL HIGH (ref 8–23)
BUN: 42 mg/dL — ABNORMAL HIGH (ref 8–23)
CO2: 29 mmol/L (ref 22–32)
CO2: 25 mmol/L (ref 22–32)
Calcium: 8.4 mg/dL — ABNORMAL LOW (ref 8.9–10.3)
Calcium: 8.4 mg/dL — ABNORMAL LOW (ref 8.9–10.3)
Chloride: 102 mmol/L (ref 98–111)
Chloride: 95 mmol/L — ABNORMAL LOW (ref 98–111)
Creatinine, Ser: 1.05 mg/dL (ref 0.61–1.24)
Creatinine, Ser: 1.18 mg/dL (ref 0.61–1.24)
GFR, Estimated: >60 mL/min (ref >60)
GFR, Estimated: >60 mL/min (ref >60)
Glucose, Bld: 147 mg/dL — ABNORMAL HIGH (ref 70–99)
Glucose, Bld: 340 mg/dL — ABNORMAL HIGH (ref 70–99)
Potassium: 3.1 mmol/L — ABNORMAL LOW (ref 3.5–5.1)
Potassium: 4.5 mmol/L (ref 3.5–5.1)
Sodium: 140 mmol/L (ref 135–145)
Sodium: 132 mmol/L — ABNORMAL LOW (ref 135–145)

## 2023-09-29 LAB — GLUCOSE, CAPILLARY
Glucose-Capillary: 123 mg/dL — ABNORMAL HIGH (ref 70–99)
Glucose-Capillary: 167 mg/dL — ABNORMAL HIGH (ref 70–99)
Glucose-Capillary: 381 mg/dL — ABNORMAL HIGH (ref 70–99)
Glucose-Capillary: 334 mg/dL — ABNORMAL HIGH (ref 70–99)
Glucose-Capillary: 132 mg/dL — ABNORMAL HIGH (ref 70–99)

## 2023-09-29 LAB — T4, FREE: Free T4: 4.51 ng/dL — ABNORMAL HIGH (ref 0.61–1.12)

## 2023-09-29 LAB — T3, FREE
T3, Free: 6.4 pg/mL — ABNORMAL HIGH (ref 2.0–4.4)
T3, Free: 5.2 pg/mL — ABNORMAL HIGH (ref 2.0–4.4)

## 2023-09-29 LAB — MAGNESIUM: Magnesium: 2.2 mg/dL (ref 1.7–2.4)

## 2023-09-29 SURGERY — CARDIOVERSION (CATH LAB)
Anesthesia: General

## 2023-09-29 MED ORDER — SODIUM CHLORIDE 0.9% FLUSH: 3.0000 mL | Freq: Two times a day (BID) | INTRAVENOUS | Status: DC

## 2023-09-29 MED ORDER — POTASSIUM CHLORIDE CRYS ER 20 MEQ PO TBCR
40.0000 meq | EXTENDED_RELEASE_TABLET | Freq: Once | ORAL | Status: AC
  Administered 2023-09-29: 40 meq via ORAL
  Filled 2023-09-29: qty 2

## 2023-09-29 MED ORDER — PHENYLEPHRINE HCL (PRESSORS) 10 MG/ML IV SOLN
INTRAVENOUS | Status: DC | PRN
  Administered 2023-09-29 (×2): 160 ug via INTRAVENOUS

## 2023-09-29 MED ORDER — PREDNISONE 20 MG PO TABS
20.0000 mg | ORAL_TABLET | Freq: Every day | ORAL | Status: DC
  Administered 2023-09-29 – 2023-09-30 (×2): 20 mg via ORAL
  Filled 2023-09-29 (×2): qty 1

## 2023-09-29 MED ORDER — SODIUM CHLORIDE 0.9% FLUSH
3.0000 mL | INTRAVENOUS | Status: DC | PRN
  Administered 2023-09-29 (×2): 10 mL via INTRAVENOUS

## 2023-09-29 MED ORDER — PROPOFOL 10 MG/ML IV BOLUS
INTRAVENOUS | Status: DC | PRN
  Administered 2023-09-29: 70 mg via INTRAVENOUS

## 2023-09-29 MED ORDER — LIDOCAINE 2% (20 MG/ML) 5 ML SYRINGE
INTRAMUSCULAR | Status: DC | PRN
  Administered 2023-09-29: 60 mg via INTRAVENOUS

## 2023-09-29 MED ORDER — INSULIN GLARGINE-YFGN 100 UNIT/ML ~~LOC~~ SOLN
10.0000 [IU] | Freq: Every day | SUBCUTANEOUS | Status: DC
  Administered 2023-09-30: 10 [IU] via SUBCUTANEOUS
  Filled 2023-09-29: qty 0.1

## 2023-09-29 MED ORDER — INSULIN ASPART 100 UNIT/ML IJ SOLN
3.0000 [IU] | Freq: Three times a day (TID) | INTRAMUSCULAR | Status: DC
  Administered 2023-09-29 – 2023-09-30 (×3): 3 [IU] via SUBCUTANEOUS

## 2023-09-29 SURGICAL SUPPLY — 1 items: PAD DEFIB RADIO PHYSIO CONN (PAD) ×1 IMPLANT

## 2023-09-29 NOTE — Anesthesia Postprocedure Evaluation (Signed)
Anesthesia Post Note  Patient: William [name].  Procedure(s) Performed: CARDIOVERSION     Patient location during evaluation: PACU Anesthesia Type: General Level of consciousness: awake and alert Pain management: pain level controlled Vital Signs Assessment: post-procedure vital signs reviewed and stable Respiratory status: spontaneous breathing, nonlabored ventilation, respiratory function stable and patient connected to nasal cannula oxygen Cardiovascular status: blood pressure returned to baseline and stable Postop Assessment: no apparent nausea or vomiting Anesthetic complications: no   No notable events documented.  Last Vitals:  Vitals:   09/29/23 0755 09/29/23 0800  BP: (!) 86/58 (!) 89/58  Pulse: 78 77  Resp: 16 17  Temp:    SpO2: 99% 98%    Last Pain:  Vitals:   09/29/23 0733  TempSrc: Temporal  PainSc: 0-No pain                 Vondra Aldredge

## 2023-09-29 NOTE — Progress Notes (Signed)
  Patient Name: William [name]. Date of Encounter: 09/29/2023  Primary Cardiologist: Julien Nordmann, MD Electrophysiologist: None  Interval Summary   S/p successful DCCV this AM Anxious about whether he will maintain normal rhythm.    Vital Signs    Vitals:   09/29/23 0741 09/29/23 0750 09/29/23 0755 09/29/23 0800  BP: (!) 88/65 (!) 74/46 (!) 86/58 (!) 89/58  Pulse: 72 78 78 77  Resp:  (!) 21 16 17   Temp:      TempSrc:      SpO2: 98% 98% 99% 98%  Weight:      Height:        Intake/Output Summary (Last 24 hours) at 09/29/2023 0830 Last data filed at 09/28/2023 2000 Gross per 24 hour  Intake 1080 ml  Output --  Net 1080 ml   Filed Weights   09/27/23 0500 09/28/23 0500 09/29/23 0500  Weight: 101 kg 101.4 kg 101.2 kg    Physical Exam    GEN- The patient is well appearing, alert and oriented x 3 today.   Lungs- Clear to ausculation bilaterally, normal work of breathing Cardiac- Regular rate and rhythm, 3/6 murmur at RSB. No rubs or gallops GI- soft, NT, ND, + BS Extremities- no clubbing or cyanosis. No edema  Telemetry    AF with improving rates 90-100 prior to DCCV Sinus in 70s since DCCV (personally reviewed)   EKG    1/20 - SR with 1st deg HB, LBBB, LAD PR - QRS - QT - ~459ms  Hospital Course    William [name]. is a 68 y.o. male with PMH persisAF, HFrEF, hyperthyroid admitted for thyroid storm, HFrEF exacerbation, AFib w RVR.   1/14 DCCV x 3, unsuccessful 1/16 - [facility] from [facility] > [facility] 1/20 - successful DCCV   Assessment & Plan    #) persis AFib #) thyroid storm iso amiodarone use #) hyperthyroid #) HFrEF, Acute on chronic S/p successful DCCV this am Sinus rates in 70s Continue 0.130mcg dig, 10mg  TID propranolol LVEF newly reduced to 20-25% (prev 40-45%), likely in tachy-mediated Continue methimazole, potassium iodide per primary team Appears well-compensated on exam Free T4 continues to improve, T3 pending Keep K > 4, Mag > 2;  replete K today    Dr. Nelly Laurence to see    For questions or updates, please contact [facility] Please consult [email or web address] for contact info under Cardiology/STEMI.  Signed, Sherie Don, NP  09/29/2023, 8:30 AM

## 2023-09-29 NOTE — Progress Notes (Signed)
Patient ID: William [name]., male   DOB: 13-May-1956, 68 y.o.   MRN: [id number]     Advanced Heart Failure Rounding Note  Cardiologist: Julien Nordmann, MD  Chief Complaint: A fib RVR /Hyperthyroidism  Subjective:    1/16 Transfer from [facility] to [facility] for thyroid storm/A fib management. Continued on  methimazole and hydrocortisone IV and started on potassium iodine drops.   Underwent successful DC-CV this am. Remains in NSR.   Denies SOB, orthopnea or PND. Tired of the food   SBP 90-low 100s    Objective:   Weight Range: 101.2 kg Body mass index is 31.12 kg/m.   Vital Signs:   Temp:  [98 F (36.7 C)-98.4 F (36.9 C)] 98 F (36.7 C) (01/20 0652) Pulse Rate:  [93-118] 106 (01/20 0652) Resp:  [12-29] 18 (01/20 0700) BP: (92-115)/(64-93) 101/73 (01/20 0652) SpO2:  [76 %-100 %] 95 % (01/20 0652) Weight:  [101.2 kg] 101.2 kg (01/20 0500) Last BM Date : 09/28/23  Weight change: Filed Weights   09/27/23 0500 09/28/23 0500 09/29/23 0500  Weight: 101 kg 101.4 kg 101.2 kg    Intake/Output:   Intake/Output Summary (Last 24 hours) at 09/29/2023 0733 Last data filed at 09/28/2023 2000 Gross per 24 hour  Intake 1080 ml  Output --  Net 1080 ml      Physical Exam    General:  Well appearing. No resp difficulty HEENT: normal Neck: supple. no JVD. Carotids 2+ bilat; no bruits. No lymphadenopathy or thryomegaly appreciated. Cor: PMI nondisplaced. Regular rate & rhythm. 2/6 SEM Lungs: clear Abdomen: soft, nontender, nondistended. No hepatosplenomegaly. No bruits or masses. Good bowel sounds. Extremities: no cyanosis, clubbing, rash, edema Neuro: alert & orientedx3, cranial nerves grossly intact. moves all 4 extremities w/o difficulty. Affect pleasant  Telemetry    AF 90-low 100s -> NSR  (personally reviewed)  Labs    CBC No results for input(s): "WBC", "NEUTROABS", "HGB", "HCT", "MCV", "PLT" in the last 72 hours.  Basic Metabolic Panel Recent Labs    69/62/95 0304  09/29/23 0400  NA 137 140  K 2.9* 3.1*  CL 98 102  CO2 28 29  GLUCOSE 209* 147*  BUN 43* 39*  CREATININE 1.11 1.05  CALCIUM 8.4* 8.4*   Liver Function Tests No results for input(s): "AST", "ALT", "ALKPHOS", "BILITOT", "PROT", "ALBUMIN" in the last 72 hours.  No results for input(s): "LIPASE", "AMYLASE" in the last 72 hours. Cardiac Enzymes No results for input(s): "CKTOTAL", "CKMB", "CKMBINDEX", "TROPONINI" in the last 72 hours.  BNP: BNP (last 3 results) Recent Labs    08/19/23 2232 09/17/23 0444  BNP 1,995.5* 2,499.1*    ProBNP (last 3 results) No results for input(s): "PROBNP" in the last 8760 hours.   D-Dimer No results for input(s): "DDIMER" in the last 72 hours. Hemoglobin A1C No results for input(s): "HGBA1C" in the last 72 hours.  Fasting Lipid Panel No results for input(s): "CHOL", "HDL", "LDLCALC", "TRIG", "CHOLHDL", "LDLDIRECT" in the last 72 hours. Thyroid Function Tests Recent Labs    09/28/23 0304  T3FREE 5.2*     Other results:   Imaging    No results found.   Medications:     Scheduled Medications:  [MAR Hold] apixaban  5 mg Oral BID   [MAR Hold] benzonatate  200 mg Oral TID   [MAR Hold] Chlorhexidine Gluconate Cloth  6 each Topical Daily   [MAR Hold] digoxin  0.125 mg Oral Daily   [MAR Hold] empagliflozin  25 mg Oral Daily   [  MAR Hold] FLUoxetine  40 mg Oral Daily   [MAR Hold] hydrocortisone sod succinate (SOLU-CORTEF) inj  100 mg Intravenous Q8H   [MAR Hold] insulin aspart  0-15 Units Subcutaneous TID WC   [MAR Hold] insulin aspart  0-5 Units Subcutaneous QHS   [MAR Hold] insulin aspart  5 Units Subcutaneous TID WC   [MAR Hold] insulin glargine-yfgn  20 Units Subcutaneous Daily   [MAR Hold] methIMAzole  15 mg Oral TID   [MAR Hold] pantoprazole  40 mg Oral QHS   [MAR Hold] potassium chloride  40 mEq Oral Daily   [MAR Hold] pravastatin  40 mg Oral QHS   [MAR Hold] propranolol  10 mg Oral TID   [MAR Hold] sodium chloride flush   3 mL Intravenous Q12H   sodium chloride flush  3-10 mL Intravenous Q12H   [MAR Hold] torsemide  40 mg Oral Daily    Infusions:    PRN Medications: [MAR Hold] acetaminophen, [MAR Hold] guaiFENesin-dextromethorphan, [MAR Hold] ipratropium, [MAR Hold] menthol-cetylpyridinium, [MAR Hold] ondansetron (ZOFRAN) IV, [MAR Hold] mouth rinse, [MAR Hold] sodium chloride flush, sodium chloride flush, [MAR Hold] zolpidem    Patient Profile  68 y.o. male with history of PAF, chronic systolic CHF, hyperthyroidism, DM II, AS.  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    Assessment/Plan   1. 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  He was taking a lower dose of methimazole than prescribed and also did not get prednisone at home.  He had prior episode of AF in 2021, had been on amiodarone since that time.  Amiodarone was stopped in 12/24 with type 2 AIT.  Needs to get back into NSR though antiarrhythmic options are limited.  Would avoid amiodarone use unless absolutely necessary, and with persistently long QTc,  do not think that he will be able to take Tikosyn. Discussed Tikosyn with EP but QT long and felt to be high risk. TEE/DCCV 1/14 failed conversion x3. EP consulted 09/24/23, recommended transfer to [facility] for inpatient endocrinology consult. No beds available. DC-CV to NSR today 09/29/23 - Continue digoxin for now - Continue propranolol 10 mg TID- BP soft. Will titrate as able  - Continue Eliquis 5 mg bid.  - Continue hyperthyroidism treatment.   See bleow - He is back in NSR 2. 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 Thyroid stimulating antibodies were negative in 12/24, less likely type 1. He was taking lower than prescribed dose of methimazole at home and never got prednisone after last discharge.  Free T4 now trending down with treatment.   - Continue Methimazole 15 mg tid.  - Prednisone restarted 1/15 for type II AIT, now has been switched to IV hydrocortisone.  Continue hydrocortisone for now, transition to prednisone after DCCV. Will discuss dosing with Endo by phone - Continue propranolol 10 mg TID. No BP room to increased.  - Got potassium iodide, stopped 09/27/23. 3. Acute systolic CHF: Presumed nonischemic cardiomyopathy, ?tachycardia-mediated with AF/RVR.  Echo in 3/21 with EF < 20%.  He was in atrial fibrillation with RVR at that time, converted to NSR on amiodarone.  Slow improvement in EF over time, echo in 4/24 with EF 40-45%, moderate aortic stenosis.  Recurrent AF/RVR in 12/24, echo with EF 20-25% with normal RV, mild-moderate MR, moderate AS with mean gradient 16 mmHg. Suspect fall in LV systolic function was tachycardia-mediated. He remains in AF/RVR as  noted above.   Volume status looks ok today.  No BP room to titrate meds.  - Continue torsemide 40 mg daily, replace K. - Continue digoxin 0.125. Level good yesterday.   - Continue propranolol 10 mg TID - Continue Jardiance.  - Add spiro 12.5 if BP tolerates - If EF does not improve significantly in NSR, consider right/left heart cath in future.  4. Aortic stenosis: Moderate on echo this admission.  5. DM2: Watch glucose with steroid use. Continue SSI.   Mobilize. Can transfer to the floor under TR[facility] care later today if remains in NSR.    Length of Stay: 4  Arvilla Meres, MD  09/29/2023, 7:33 AM  Advanced Heart Failure Team Pager [phone] (M-F; 7a - 5p)  Please contact [facility] for night-coverage after hours (5p -7a ) and weekends on [email or web address]

## 2023-09-29 NOTE — Transfer of Care (Signed)
Immediate Anesthesia Transfer of Care Note  Patient: William [name].  Procedure(s) Performed: CARDIOVERSION  Patient Location: PACU  Anesthesia Type:MAC  Level of Consciousness: awake and sedated  Airway & Oxygen Therapy: Patient Spontanous Breathing and Patient connected to nasal cannula oxygen  Post-op Assessment: Report given to RN and Post -op Vital signs reviewed and stable  Post vital signs: Reviewed and stable  Last Vitals:  Vitals Value Taken Time  BP    Temp    Pulse    Resp    SpO2      Last Pain:  Vitals:   09/29/23 0700  TempSrc:   PainSc: 0-No pain         Complications: No notable events documented.

## 2023-09-29 NOTE — Plan of Care (Signed)

## 2023-09-29 NOTE — Progress Notes (Addendum)
PROGRESS NOTE    William [name].  ZOX:[id number] DOB: 05/31/1956 DOA: 09/25/2023 PCP: Lynnea Ferrier, MD  67/M diagnosed with CHF and A-fib RVR 3/21, EF<20%, he was cardioverted and started on amiodarone, subsequently EF improved to 40-45%. -Subsequently admitted 08/20/2023 to [facility] with A-fib RVR and CHF in the setting of amiodarone induced thyroid toxicity, echo noted EF of 20-25%, moderate aortic stenosis, started on prednisone and methimazole, spontaneously converted to sinus rhythm and discharged home, apparently quickly ran out of prednisone and pharmacy dispensed 5 Mg 3 times daily of methimazole instead of 15 Mg 3 times daily. -Readmitted to [facility] 09/17/2023 with A-fib RVR acute on chronic systolic CHF, thyroid storm, restarted on methimazole 15 Mg 3 times daily, prednisone 40 Mg daily> switched to IV hydrocortisone 1/16, propranolol, transfer requested to [facility] for endocrinology evaluation for several days however [facility] at capacity, eventually transferred to [facility] 1/16 -Improving and stabilized on methimazole, IV hydrocortisone, potassium iodine, propranolol -1/20, cardioverted to sinus rhythm  Subjective: -Feels better today  Assessment and Plan:  Amiodarone induced thyrotoxicosis, thyroid storm, Type 2 -Amiodarone discontinued last admission 4 weeks ago -Suspect current flare related to interruption of recent steroid therapy -TSH undetectable, free T3 and T4 remain elevated since last week -Thyroid-stimulating immunoglobulin and thyroid peroxidase antibodies negative now and back in 2024/09/07 -I called and reviewed case with local endocrinologist 1/17 and again today -Continue methimazole 15 Mg 3 times daily, change to methimazole 10 Mg twice daily at discharge -Propranolol 10 Mg 3 times daily -Potassium iodine X 2 days-completed -IV hydrocortisone 100mg  TID X 4 days completed, discussed with endocrinology today, suggested prednisone 20 Mg daily at discharge and follow-up with her in  few weeks   Hyperglycemia Secondary to high-dose IV steroids -A1c is 7.0 (suspect he has steroid-induced hyperglycemia rather than diabetes), with decreasing steroid dose will cut down glargine and meal coverage NovoLog   Afib RVR -Triggered by thyrotoxicosis, management as above, on digoxin, propranolol, Eliquis   Acute on chronic systolic CHF -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 -Now on oral torsemide, digoxin, Jardiance   Moderate aortic stenosis   DVT prophylaxis: Apixaban Code Status: Full code Family Communication: None present Disposition Plan: Home likely 1 to 2 days  Consultants:    Procedures:   Antimicrobials:    Objective: Vitals:   09/29/23 0750 09/29/23 0755 09/29/23 0800 09/29/23 0843  BP: (!) 74/46 (!) 86/58 (!) 89/58   Pulse: 78 78 77   Resp: (!) 21 16 17    Temp:    99.6 F (37.6 C)  TempSrc:    Oral  SpO2: 98% 99% 98%   Weight:      Height:        Intake/Output Summary (Last 24 hours) at 09/29/2023 1156 Last data filed at 09/28/2023 2000 Gross per 24 hour  Intake 840 ml  Output --  Net 840 ml   Filed Weights   09/27/23 0500 09/28/23 0500 09/29/23 0500  Weight: 101 kg 101.4 kg 101.2 kg    Examination:  General exam: Frail male sitting up in bed, better spirits today, AAOx3 HEENT: No JVD CVS: S1-S2, regular rhythm, systolic murmur Lungs: Clear bilaterally Abdomen: Soft, nontender, bowel sounds present Extremities: No edema Skin: No rashes Psychiatry:  Mood & affect appropriate.     Data Reviewed:   CBC: Recent Labs  Lab 09/23/23 0436 09/25/23 2130  WBC 9.2 9.7  NEUTROABS  --  6.5  HGB 12.4* 12.7*  HCT 37.4* 39.0  MCV 84.8 84.8  PLT 257 300   Basic Metabolic Panel: Recent Labs  Lab 09/23/23 0436 09/24/23 0439 09/25/23 0649 09/25/23 2130 09/26/23 0211 09/27/23 0734 09/28/23 0304 09/29/23 0400  NA 136 136 137 131* 135 135 137 140  K 3.1* 3.4* 3.3* 3.8 3.6 3.6 2.9* 3.1*  CL 94* 93* 93* 93* 95* 94* 98 102   CO2 31 30 28 27 27 29 28 29   GLUCOSE 145* 241* 196* 307* 162* 238* 209* 147*  BUN 39* 40* 45* 53* 50* 45* 43* 39*  CREATININE 0.93 1.03 0.99 1.51* 1.32* 1.24 1.11 1.05  CALCIUM 8.5* 8.5* 8.8* 8.3* 8.6* 8.7* 8.4* 8.4*  MG 2.5* 2.5* 2.8* 2.5*  --   --   --   --    GFR: Estimated Creatinine Clearance: 83.9 mL/min (by C-G formula based on SCr of 1.05 mg/dL). Liver Function Tests: Recent Labs  Lab 09/25/23 1139 09/25/23 2130  AST 25 21  ALT 24 19  ALKPHOS 76 65  BILITOT 0.9 0.5  PROT 7.0 5.8*  ALBUMIN 3.6 2.9*   No results for input(s): "LIPASE", "AMYLASE" in the last 168 hours. No results for input(s): "AMMONIA" in the last 168 hours. Coagulation Profile: No results for input(s): "INR", "PROTIME" in the last 168 hours. Cardiac Enzymes: No results for input(s): "CKTOTAL", "CKMB", "CKMBINDEX", "TROPONINI" in the last 168 hours. BNP (last 3 results) No results for input(s): "PROBNP" in the last 8760 hours. HbA1C: No results for input(s): "HGBA1C" in the last 72 hours.  CBG: Recent Labs  Lab 09/28/23 1600 09/28/23 2212 09/29/23 0615 09/29/23 0836 09/29/23 1130  GLUCAP 129* 250* 123* 167* 381*   Lipid Profile: No results for input(s): "CHOL", "HDL", "LDLCALC", "TRIG", "CHOLHDL", "LDLDIRECT" in the last 72 hours. Thyroid Function Tests: Recent Labs    09/28/23 0304 09/29/23 0400  FREET4 4.72* 4.51*  T3FREE 5.2*  --    Anemia Panel: No results for input(s): "VITAMINB12", "FOLATE", "FERRITIN", "TIBC", "IRON", "RETICCTPCT" in the last 72 hours. Urine analysis:    Component Value Date/Time   COLORURINE YELLOW (A) 08/07/2016 1337   APPEARANCEUR CLEAR (A) 08/07/2016 1337   LABSPEC 1.020 08/07/2016 1337   PHURINE 5.0 08/07/2016 1337   GLUCOSEU NEGATIVE 08/07/2016 1337   HGBUR NEGATIVE 08/07/2016 1337   BILIRUBINUR NEGATIVE 08/07/2016 1337   KETONESUR NEGATIVE 08/07/2016 1337   PROTEINUR NEGATIVE 08/07/2016 1337   NITRITE NEGATIVE 08/07/2016 1337   LEUKOCYTESUR  NEGATIVE 08/07/2016 1337   Sepsis Labs: @LABRCNTIP (procalcitonin:4,lacticidven:4)  ) Recent Results (from the past 240 hours)  Respiratory (~20 pathogens) panel by PCR     Status: None   Collection Time: 09/19/23  6:50 PM   Specimen: Nasopharyngeal Swab; Respiratory  Result Value Ref Range Status   Adenovirus NOT DETECTED NOT DETECTED Final   Coronavirus 229E NOT DETECTED NOT DETECTED Final    Comment: (NOTE) The Coronavirus on the Respiratory Panel, DOES NOT test for the novel  Coronavirus (2019 nCoV)    Coronavirus HKU1 NOT DETECTED NOT DETECTED Final   Coronavirus NL63 NOT DETECTED NOT DETECTED Final   Coronavirus OC43 NOT DETECTED NOT DETECTED Final   Metapneumovirus NOT DETECTED NOT DETECTED Final   Rhinovirus / Enterovirus NOT DETECTED NOT DETECTED Final   Influenza A NOT DETECTED NOT DETECTED Final   Influenza B NOT DETECTED NOT DETECTED Final   Parainfluenza Virus 1 NOT DETECTED NOT DETECTED Final   Parainfluenza Virus 2 NOT DETECTED NOT DETECTED Final   Parainfluenza Virus 3 NOT DETECTED NOT DETECTED Final   Parainfluenza Virus 4 NOT  DETECTED NOT DETECTED Final   Respiratory Syncytial Virus NOT DETECTED NOT DETECTED Final   Bordetella pertussis NOT DETECTED NOT DETECTED Final   Bordetella Parapertussis NOT DETECTED NOT DETECTED Final   Chlamydophila pneumoniae NOT DETECTED NOT DETECTED Final   Mycoplasma pneumoniae NOT DETECTED NOT DETECTED Final    Comment: Performed at [facility], [address], [place], [place] [address]  MRSA Next Gen by PCR, Nasal     Status: None   Collection Time: 09/26/23  2:57 PM   Specimen: Nasal Mucosa; Nasal Swab  Result Value Ref Range Status   MRSA by PCR Next Gen NOT DETECTED NOT DETECTED Final    Comment: (NOTE) The GeneXpert MRSA Assay (FDA approved for NASAL specimens only), is one component of a comprehensive MRSA colonization surveillance program. It is not intended to diagnose MRSA infection nor to guide or monitor  treatment for MRSA infections. Test performance is not FDA approved in patients less than 76 years old. Performed at [facility], [address], [place], [place] [address]      Radiology Studies: EP STUDY Result Date: 09/29/2023 See surgical note for result.    Scheduled Meds:  apixaban  5 mg Oral BID   benzonatate  200 mg Oral TID   Chlorhexidine Gluconate Cloth  6 each Topical Daily   digoxin  0.125 mg Oral Daily   empagliflozin  25 mg Oral Daily   FLUoxetine  40 mg Oral Daily   insulin aspart  0-15 Units Subcutaneous TID WC   insulin aspart  0-5 Units Subcutaneous QHS   insulin aspart  5 Units Subcutaneous TID WC   insulin glargine-yfgn  20 Units Subcutaneous Daily   methIMAzole  15 mg Oral TID   pantoprazole  40 mg Oral QHS   potassium chloride  40 mEq Oral Daily   pravastatin  40 mg Oral QHS   predniSONE  20 mg Oral Q breakfast   propranolol  10 mg Oral TID   sodium chloride flush  3 mL Intravenous Q12H   torsemide  40 mg Oral Daily   Continuous Infusions:   LOS: 4 days    Time spent:    Zannie Cove, MD [facility]   09/29/2023, 11:56 AM

## 2023-09-29 NOTE — Interval H&P Note (Signed)
History and Physical Interval Note:  09/29/2023 7:23 AM  William Dy.  has presented today for surgery, with the diagnosis of afib.  The various methods of treatment have been discussed with the patient and family. After consideration of risks, benefits and other options for treatment, the patient has consented to  Procedure(s): CARDIOVERSION (N/A) as a surgical intervention.  The patient's history has been reviewed, patient examined, no change in status, stable for surgery.  I have reviewed the patient's chart and labs.  Questions were answered to the patient's satisfaction.     William [name]

## 2023-09-29 NOTE — CV Procedure (Signed)
    DIRECT CURRENT CARDIOVERSION  NAME:  William [name].   MRN: [id number] DOB:  April 26, 1956   ADMIT DATE: 09/25/2023   INDICATIONS: Atrial fibrillation    PROCEDURE:   Informed consent was obtained prior to the procedure. The risks, benefits and alternatives for the procedure were discussed and the patient comprehended these risks. Once an appropriate time out was taken, the patient had the defibrillator pads placed in the anterior and posterior position. The patient then underwent sedation by the anesthesia service. Once an appropriate level of sedation was achieved, the patient received a single biphasic, synchronized 200J shock with prompt conversion to sinus rhythm. No apparent complications.  Arvilla Meres, MD  7:32 AM

## 2023-09-30 ENCOUNTER — Encounter (HOSPITAL_COMMUNITY): Payer: Self-pay | Admitting: Internal Medicine

## 2023-09-30 ENCOUNTER — Encounter: Payer: Managed Care, Other (non HMO) | Admitting: Family

## 2023-09-30 ENCOUNTER — Other Ambulatory Visit (HOSPITAL_COMMUNITY): Payer: Self-pay

## 2023-09-30 DIAGNOSIS — I4819 Other persistent atrial fibrillation: Secondary | ICD-10-CM | POA: Diagnosis not present

## 2023-09-30 DIAGNOSIS — I5023 Acute on chronic systolic (congestive) heart failure: Secondary | ICD-10-CM | POA: Diagnosis not present

## 2023-09-30 LAB — GLUCOSE, CAPILLARY: Glucose-Capillary: 230 mg/dL — ABNORMAL HIGH (ref 70–99)

## 2023-09-30 LAB — T3, FREE: T3, Free: 4.5 pg/mL — ABNORMAL HIGH (ref 2.0–4.4)

## 2023-09-30 LAB — BASIC METABOLIC PANEL
Anion gap: 8 (ref 5–15)
BUN: 43 mg/dL — ABNORMAL HIGH (ref 8–23)
CO2: 27 mmol/L (ref 22–32)
Calcium: 8.4 mg/dL — ABNORMAL LOW (ref 8.9–10.3)
Chloride: 103 mmol/L (ref 98–111)
Creatinine, Ser: 1.14 mg/dL (ref 0.61–1.24)
GFR, Estimated: >60 mL/min (ref >60)
Glucose, Bld: 87 mg/dL (ref 70–99)
Potassium: 3.5 mmol/L (ref 3.5–5.1)
Sodium: 138 mmol/L (ref 135–145)

## 2023-09-30 LAB — T4, FREE: Free T4: 4.11 ng/dL — ABNORMAL HIGH (ref 0.61–1.12)

## 2023-09-30 MED ORDER — METHIMAZOLE 10 MG PO TABS
10.0000 mg | ORAL_TABLET | Freq: Two times a day (BID) | ORAL | 1 refills | Status: DC
  Filled 2023-09-30: qty 60, 30d supply, fill #0

## 2023-09-30 MED ORDER — APIXABAN 5 MG PO TABS
5.0000 mg | ORAL_TABLET | Freq: Two times a day (BID) | ORAL | 1 refills | Status: DC
  Filled 2023-09-30: qty 60, 30d supply, fill #0

## 2023-09-30 MED ORDER — TORSEMIDE 20 MG PO TABS
20.0000 mg | ORAL_TABLET | Freq: Every day | ORAL | Status: DC
  Administered 2023-09-30: 20 mg via ORAL

## 2023-09-30 MED ORDER — POTASSIUM CHLORIDE CRYS ER 20 MEQ PO TBCR
20.0000 meq | EXTENDED_RELEASE_TABLET | Freq: Every day | ORAL | 1 refills | Status: DC
  Filled 2023-09-30: qty 30, 30d supply, fill #0

## 2023-09-30 MED ORDER — JARDIANCE 25 MG PO TABS
25.0000 mg | ORAL_TABLET | Freq: Every day | ORAL | 1 refills | Status: DC
  Filled 2023-09-30: qty 30, 30d supply, fill #0

## 2023-09-30 MED ORDER — TORSEMIDE 20 MG PO TABS
20.0000 mg | ORAL_TABLET | Freq: Every day | ORAL | 1 refills | Status: DC
  Filled 2023-09-30: qty 30, 30d supply, fill #0

## 2023-09-30 MED ORDER — DIGOXIN 125 MCG PO TABS
0.1250 mg | ORAL_TABLET | Freq: Every day | ORAL | 1 refills | Status: DC
  Filled 2023-09-30: qty 30, 30d supply, fill #0

## 2023-09-30 MED ORDER — PREDNISONE 20 MG PO TABS
20.0000 mg | ORAL_TABLET | Freq: Every day | ORAL | 1 refills | Status: DC
  Filled 2023-09-30: qty 30, 30d supply, fill #0

## 2023-09-30 MED ORDER — PROPRANOLOL HCL 10 MG PO TABS
10.0000 mg | ORAL_TABLET | Freq: Three times a day (TID) | ORAL | 1 refills | Status: DC
  Filled 2023-09-30: qty 90, 30d supply, fill #0

## 2023-09-30 MED ORDER — SPIRONOLACTONE 25 MG PO TABS
12.5000 mg | ORAL_TABLET | Freq: Every day | ORAL | 1 refills | Status: DC
  Filled 2023-09-30: qty 15, 30d supply, fill #0

## 2023-09-30 MED ORDER — SPIRONOLACTONE 12.5 MG HALF TABLET
12.5000 mg | ORAL_TABLET | Freq: Every day | ORAL | Status: DC
  Administered 2023-09-30: 12.5 mg via ORAL
  Filled 2023-09-30: qty 1

## 2023-09-30 NOTE — Progress Notes (Signed)
Rounding Note    Patient Name: William [name]. Date of Encounter: 09/30/2023  [facility] Cardiologist: Julien Nordmann, MD   Subjective   Very much wants to go home  Inpatient Medications    Scheduled Meds:  apixaban  5 mg Oral BID   benzonatate  200 mg Oral TID   Chlorhexidine Gluconate Cloth  6 each Topical Daily   digoxin  0.125 mg Oral Daily   empagliflozin  25 mg Oral Daily   FLUoxetine  40 mg Oral Daily   insulin aspart  0-15 Units Subcutaneous TID WC   insulin aspart  0-5 Units Subcutaneous QHS   insulin aspart  3 Units Subcutaneous TID WC   insulin glargine-yfgn  10 Units Subcutaneous Daily   methIMAzole  15 mg Oral TID   pantoprazole  40 mg Oral QHS   potassium chloride  40 mEq Oral Daily   pravastatin  40 mg Oral QHS   predniSONE  20 mg Oral Q breakfast   propranolol  10 mg Oral TID   sodium chloride flush  3 mL Intravenous Q12H   torsemide  40 mg Oral Daily   Continuous Infusions:  PRN Meds: acetaminophen, guaiFENesin-dextromethorphan, ipratropium, menthol-cetylpyridinium, ondansetron (ZOFRAN) IV, mouth rinse, sodium chloride flush, zolpidem   Vital Signs    Vitals:   09/29/23 1700 09/29/23 1800 09/29/23 2000 09/30/23 0626  BP: 107/77 97/62 96/64  (!) 97/58  Pulse: 78 73 68 70  Resp: (!) 24 12 14 16   Temp:   97.9 F (36.6 C) 97.8 F (36.6 C)  TempSrc:   Oral Oral  SpO2: 97% 97% 99% 98%  Weight:    103.6 kg  Height:        Intake/Output Summary (Last 24 hours) at 09/30/2023 0837 Last data filed at 09/30/2023 0805 Gross per 24 hour  Intake 490 ml  Output --  Net 490 ml      09/30/2023    6:26 AM 09/29/2023    5:00 AM 09/28/2023    5:00 AM  Last 3 Weights  Weight (lbs) 228 lb 8 oz 223 lb 1.7 oz 223 lb 8.7 oz  Weight (kg) 103.647 kg 101.2 kg 101.4 kg      Telemetry    SR 60's - Personally Reviewed  ECG    Yesterday SR 73bpm, 1st degree AVBlock, , LAD, LBBB  - Personally Reviewed  Physical Exam   GEN: No acute  distress.   Neck: No JVD Cardiac: RRR, no murmurs, rubs, or gallops.  Respiratory: CTA b/l GI: Soft, nontender, non-distended  MS: No edema; No deformity. Neuro:  Nonfocal  Psych: Normal affect   Labs    High Sensitivity Troponin:   Recent Labs  Lab 09/17/23 0444 09/17/23 0625  TROPONINIHS 23* 22*     Chemistry Recent Labs  Lab 09/25/23 0649 09/25/23 1139 09/25/23 2130 09/26/23 0211 09/29/23 0400 09/29/23 1346 09/30/23 0432  NA 137  --  131*   < > 140 132* 138  K 3.3*  --  3.8   < > 3.1* 4.5 3.5  CL 93*  --  93*   < > 102 95* 103  CO2 28  --  27   < > 29 25 27   GLUCOSE 196*  --  307*   < > 147* 340* 87  BUN 45*  --  53*   < > 39* 42* 43*  CREATININE 0.99  --  1.51*   < > 1.05 1.18 1.14  CALCIUM 8.8*  --  8.3*   < > 8.4* 8.4* 8.4*  MG 2.8*  --  2.5*  --   --  2.2  --   PROT  --  7.0 5.8*  --   --   --   --   ALBUMIN  --  3.6 2.9*  --   --   --   --   AST  --  25 21  --   --   --   --   ALT  --  24 19  --   --   --   --   ALKPHOS  --  76 65  --   --   --   --   BILITOT  --  0.9 0.5  --   --   --   --   GFRNONAA >60  --  51*   < > >60 >60 >60  ANIONGAP 16*  --  11   < > 9 12 8    < > = values in this interval not displayed.    Lipids No results for input(s): "CHOL", "TRIG", "HDL", "LABVLDL", "LDLCALC", "CHOLHDL" in the last 168 hours.  Hematology Recent Labs  Lab 09/25/23 2130  WBC 9.7  RBC 4.60  HGB 12.7*  HCT 39.0  MCV 84.8  MCH 27.6  MCHC 32.6  RDW 13.3  PLT 300   Thyroid  Recent Labs  Lab 09/25/23 0649 09/27/23 0734 09/30/23 0432  TSH <0.010*  --   --   FREET4 5.37*   < > 4.11*   < > = values in this interval not displayed.    BNPNo results for input(s): "BNP", "PROBNP" in the last 168 hours.  DDimer No results for input(s): "DDIMER" in the last 168 hours.   Radiology    EP STUDY Result Date: 09/29/2023 See surgical note for result.   Cardiac Studies   Echo in 3/21 showed EF < 20%.  He underwent DCCV back to NSR and was started on  amiodarone. LV function made some improvement over time, in 4/24, echo showed EF 40-45% with moderate AS.  Echo in 12/24 showed EF 20-25% with normal RV, mild-moderate MR, moderate AS with mean gradient 16 mmHg.   Patient Profile     68 y.o. male AF since COVID vaccine in 2021, chronic systolic CHF, DM  Amiodarone induced hyperthyroid noted > amio stopped Dec 2024 Presented to [facility] with thryoid storm >> eventually transferred to [facility] for management, RVR  Assessment & Plan    Persistent Afib CHA2DS2Vasc is 2, on Eliquis DCCV yesterday Holding SR  Continue same propranolol/dig  Not a Tikosyn candidate (baseline QT too long)  C/w IM/AHF teams:  Thyroid storm Hypothyroid by hx Acute/chronic CHF (systolic)  For questions or updates, please contact [facility] Please consult [email or web address] for contact info under        Signed, Sheilah Pigeon, PA-C  09/30/2023, 8:37 AM

## 2023-09-30 NOTE — Progress Notes (Signed)
CARDIAC REHAB PHASE I   HF education including HF booklet, heart healthy diabetic low sodium diet, exercise guidelines, restrictions, risk factors and CRP2 reviewed. All questions and concerns addressed. Pt not interested in CRP2 at this time. Plan for home today.   Woodroe Chen, RN BSN 09/30/2023 11:34 AM

## 2023-09-30 NOTE — TOC Transition Note (Signed)
Transition of Care [facility]) - Discharge Note   Patient Details  Name: William [name]. MRN: [id number] Date of Birth: 05-May-1956  Transition of Care [facility]) CM/SW Contact:  Nicanor Bake Phone Number: [phone] 09/30/2023, 11:03 AM   Clinical Narrative: HF CSW spoke with pt over the phone. Pt stated that his sister will be providing transportation home. CSW explained to pt his upcoming appointments. Pt stated that he understood.    CSW called to scheduled pts hospital follow up appointment for Tuesday, October 07, 2023 at 11:15 am.       Barriers to Discharge: Continued Medical Work up   Patient Goals and CMS Choice Patient states their goals for this hospitalization and ongoing recovery are:: wants to keep working and remain independent          Discharge Placement                       Discharge Plan and Services Additional resources added to the After Visit Summary for     Discharge Planning Services: CM Consult                                 Social Drivers of Health (SDOH) Interventions SDOH Screenings   Food Insecurity: No Food Insecurity (09/26/2023)  Housing: Low Risk  (09/26/2023)  Transportation Needs: No Transportation Needs (09/26/2023)  Utilities: Not At Risk (09/26/2023)  Depression (PHQ2-9): Low Risk  (11/23/2021)  Financial Resource Strain: Low Risk  (05/16/2023)   Received from [facility]  Social Connections: Moderately Integrated (09/26/2023)  Stress: Stress Concern Present (12/03/2019)   Received from [facility]  Tobacco Use: Low Risk  (09/26/2023)     Readmission Risk Interventions     No data to display

## 2023-09-30 NOTE — Discharge Summary (Addendum)
Physician Discharge Summary  William [name]. MVH:[id number] DOB: [birth year] DOA: 09/25/2023  PCP: Lynnea Ferrier, MD  Admit date: 09/25/2023 Discharge date: 09/30/2023  Time spent: 45 minutes  Recommendations for Outpatient Follow-up:  Cardiology at [facility] 1/29, please check BMP at follow-up Endocrinology Dr. Elvera Lennox in 2 to 3 weeks, follow-up requested   Discharge Diagnoses:  Principal Problem:   Acute on chronic systolic CHF (congestive heart failure) (HCC) Atrial fibrillation with RVR Thyroid storm Amiodarone induced thyrotoxicosis, type II Steroid-induced hyperglycemia Moderate aortic stenosis  Discharge Condition: Improved  Diet recommendation: Low-sodium, heart healthy  Filed Weights   09/28/23 0500 09/29/23 0500 09/30/23 0626  Weight: 101.4 kg 101.2 kg 103.6 kg    History of present illness:  68/M diagnosed with CHF and A-fib RVR 3/21, EF<20%, he was cardioverted and started on amiodarone, subsequently EF improved to 40-45%. -Subsequently admitted 08/20/2023 to [facility] with A-fib RVR and CHF in the setting of amiodarone induced thyroid toxicity, echo noted EF of 20-25%, moderate aortic stenosis, started on prednisone and methimazole, spontaneously converted to sinus rhythm and discharged home, apparently quickly ran out of prednisone and pharmacy dispensed 5 Mg 3 times daily of methimazole instead of 15 Mg 3 times daily. -Readmitted to [facility] 09/17/2023 with A-fib RVR acute on chronic systolic CHF, thyroid storm, restarted on methimazole 15 Mg 3 times daily, prednisone 40 Mg daily> switched to IV hydrocortisone 1/16, propranolol, transfer requested to [facility] for endocrinology evaluation for several days however [facility] at capacity, eventually transferred to [facility] 1/16 -Improving and stabilized on methimazole, IV hydrocortisone, potassium iodine, propranolol -1/20, cardioverted to sinus rhythm    Hospital Course:  Amiodarone induced thyrotoxicosis, thyroid storm, Type 2 -Amiodarone  discontinued last admission 4 weeks ago -Suspect current flare related to interruption of recent steroid therapy -TSH undetectable, free T3 and T4 remain elevated since last week -Thyroid-stimulating immunoglobulin and thyroid peroxidase antibodies negative now and back in 2024-09-12 -I called and reviewed case with local endocrinologist 1/17 and again 1/20 -He was treated with high-dose IV hydrocortisone for 4 days and then transition to oral prednisone 20 Mg daily yesterday -Was also on methimazole 15 Mg 3 times daily, changed to methimazole 10 Mg twice daily at discharge -Completed 2 days of potassium iodine -He will continue propranolol, methimazole and prednisone at discharge -follow-up with Dr. 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 -Now on oral torsemide, digoxin, Jardiance   Moderate aortic stenosis  Discharge Exam: Vitals:   09/29/23 2000 09/30/23 0626  BP: 96/64 (!) 97/58  Pulse: 68 70  Resp: 14 16  Temp: 97.9 F (36.6 C) 97.8 F (36.6 C)  SpO2: 99% 98%   Gen: Awake, Alert, Oriented X 3,  HEENT: no JVD Lungs: Good air movement bilaterally, CTAB CVS: S1S2/RRR Abd: soft, Non tender, non distended, BS present Extremities: No edema Skin: no new rashes on exposed skin   Discharge Instructions   Discharge Instructions     Diet - low sodium heart healthy   Complete by: As directed    Increase activity slowly   Complete by: As directed  Allergies as of 09/30/2023       Reactions   Morphine And Codeine Hives   Nsaids Other (See Comments)    Gastric bypass        Medication List     STOP taking these medications    furosemide 40 MG tablet Commonly known as: LASIX   metoprolol succinate 50 MG 24 hr tablet Commonly known as: TOPROL-XL       TAKE these medications    apixaban 5 MG Tabs tablet Commonly known as: Eliquis Take 1 tablet (5 mg total) by mouth 2 (two) times daily.   digoxin 0.125 MG tablet Commonly known as: LANOXIN Take 1 tablet (0.125 mg total) by mouth daily. Start taking on: October 01, 2023   FLUoxetine 40 MG capsule Commonly known as: PROZAC Take 1 capsule by mouth daily.   Jardiance 25 MG Tabs tablet Generic drug: empagliflozin Take 1 tablet (25 mg total) by mouth daily.   metFORMIN 1000 MG tablet Commonly known as: GLUCOPHAGE Take 1,000 mg by mouth 2 (two) times daily with a meal.   methimazole 10 MG tablet Commonly known as: TAPAZOLE Take 1 tablet (10 mg total) by mouth 2 (two) times daily. What changed:  medication strength how much to take when to take this   Mounjaro 12.5 MG/0.5ML Pen Generic drug: tirzepatide Inject 12.5 mg into the skin every Tuesday.   Multivitamin Men Tabs Take 1 tablet by mouth daily.   pantoprazole 40 MG tablet Commonly known as: PROTONIX Take 40 mg by mouth every evening.   potassium chloride SA 20 MEQ tablet Commonly known as: KLOR-CON M Take 1 tablet (20 mEq total) by mouth daily.   pravastatin 40 MG tablet Commonly known as: PRAVACHOL Take 40 mg by mouth at bedtime.   predniSONE 20 MG tablet Commonly known as: DELTASONE Take 1 tablet (20 mg total) by mouth daily with breakfast. Start taking on: October 01, 2023   propranolol 10 MG tablet Commonly known as: INDERAL Take 1 tablet (10 mg total) by mouth 3 (three) times daily.   spironolactone 25 MG tablet Commonly known as: ALDACTONE Take 0.5 tablets (12.5 mg total) by mouth daily. Start taking on: October 01, 2023   torsemide 20 MG tablet Commonly known as: DEMADEX Take 1  tablet (20 mg total) by mouth daily. Start taking on: October 01, 2023   VITAMIN B-12 PO Take 1 tablet by mouth daily.       Allergies  Allergen Reactions   Morphine And Codeine Hives   Nsaids Other (See Comments)    Gastric bypass    Follow-up Information     [facility] Follow up on 10/08/2023.   Specialty: Cardiology Why: At 10:15 Contact information: [address] [place] [address] [phone]                 The results of significant diagnostics from this hospitalization (including imaging, microbiology, ancillary and laboratory) are listed below for reference.    Significant Diagnostic Studies: EP STUDY Result Date: 09/29/2023 See surgical note for result.  CT Angio Chest/Abd/Pel for Dissection W and/or Wo Contrast Result Date: 09/17/2023 CLINICAL DATA:  Chest pain. EXAM: CT ANGIOGRAPHY CHEST, ABDOMEN AND PELVIS TECHNIQUE: Non-contrast CT of the chest was initially obtained. Multidetector CT imaging through the chest, abdomen and pelvis was performed using the standard protocol during bolus administration of intravenous contrast. Multiplanar reconstructed images and MIPs were obtained and reviewed to evaluate the vascular  anatomy. RADIATION DOSE REDUCTION: This exam was performed according to the departmental dose-optimization program which includes automated exposure control, adjustment of the mA and/or kV according to patient size and/or use of iterative reconstruction technique. CONTRAST:  OMNIPAQUE IOHEXOL 350 MG/ML SOLN COMPARISON:  August 20, 2023. FINDINGS: CTA CHEST FINDINGS Cardiovascular: Preferential opacification of the thoracic aorta. No evidence of thoracic aortic aneurysm or dissection. Normal heart size. No pericardial effusion. Aortic valve calcifications are noted. Coronary artery calcifications are noted. Mediastinum/Nodes: No enlarged mediastinal, hilar, or axillary  lymph nodes. Thyroid gland, trachea, and esophagus demonstrate no significant findings. Lungs/Pleura: Small bilateral pleural effusions are noted with minimal adjacent subsegmental atelectasis. No pneumothorax is noted. Musculoskeletal: No chest wall abnormality. No acute or significant osseous findings. Review of the MIP images confirms the above findings. CTA ABDOMEN AND PELVIS FINDINGS VASCULAR Aorta: Atherosclerosis of abdominal aorta is noted without aneurysm or dissection. Celiac: Patent without evidence of aneurysm, dissection, vasculitis or significant stenosis. SMA: Patent without evidence of aneurysm, dissection, vasculitis or significant stenosis. Renals: Both renal arteries are patent without evidence of aneurysm, dissection, vasculitis, fibromuscular dysplasia or significant stenosis. IMA: Patent without evidence of aneurysm, dissection, vasculitis or significant stenosis. Inflow: Patent without evidence of aneurysm, dissection, vasculitis or significant stenosis. Veins: No obvious venous abnormality within the limitations of this arterial phase study. Review of the MIP images confirms the above findings. NON-VASCULAR Hepatobiliary: No focal liver abnormality is seen. No gallstones, gallbladder wall thickening, or biliary dilatation. Pancreas: Fatty replacement of the pancreas is noted. Spleen: Normal in size without focal abnormality. Adrenals/Urinary Tract: Adrenal glands are unremarkable. Large calcified renal cyst is noted in upper pole which is unchanged. Smaller cyst is seen more inferiorly in right kidney. No follow-up is required for these. No hydronephrosis or renal obstruction. Urinary bladder is unremarkable. Stomach/Bowel: Status post gastric bypass. Appendix appears normal. No evidence of bowel obstruction or inflammation. Lymphatic: No adenopathy. Reproductive: Prostate is unremarkable. Other: Small fat containing periumbilical hernia.  No ascites. Musculoskeletal: Status post right total  hip arthroplasty. No acute osseous abnormality is noted. Review of the MIP images confirms the above findings. IMPRESSION: No evidence of thoracic or abdominal aortic dissection or aneurysm. Small pleural effusions are noted. Coronary artery calcifications are noted. Aortic Atherosclerosis (ICD10-I70.0). Electronically Signed   By: Lupita Raider M.D.   On: 09/17/2023 08:41   DG Chest 2 View Result Date: 09/17/2023 CLINICAL DATA:  Chest pains and coughing. EXAM: CHEST - 2 VIEW COMPARISON:  Portable chest 08/20/2023 FINDINGS: 5:05 a.m. There is mild cardiomegaly. There is central vascular prominence and flow cephalization and increased mild generalized interstitial consolidation consistent with edema, with small pleural effusions beginning to blunt the sulci. No alveolar infiltrate is seen. The mediastinum is normally outlined. Interval right PICC removal. Thoracic cage is intact. IMPRESSION: Findings consistent with CHF and mild interstitial edema. Small pleural effusions. Electronically Signed   By: Almira Bar M.D.   On: 09/17/2023 06:13    Microbiology: Recent Results (from the past 240 hours)  MRSA Next Gen by PCR, Nasal     Status: None   Collection Time: 09/26/23  2:57 PM   Specimen: Nasal Mucosa; Nasal Swab  Result Value Ref Range Status   MRSA by PCR Next Gen NOT DETECTED NOT DETECTED Final    Comment: (NOTE) The GeneXpert MRSA Assay (FDA approved for NASAL specimens only), is one component of a comprehensive MRSA colonization surveillance program. It is not intended to diagnose MRSA infection nor to guide  or monitor treatment for MRSA infections. Test performance is not FDA approved in patients less than 57 years old. Performed at [facility], [address], [place], [place] [address]      Labs: Basic Metabolic Panel: Recent Labs  Lab 09/24/23 [phone] 09/25/23 4098 09/25/23 2130 09/26/23 0211 09/27/23 0734 09/28/23 0304 09/29/23 0400 09/29/23 1346 09/30/23 0432   NA 136 137 131*   < > 135 137 140 132* 138  K 3.4* 3.3* 3.8   < > 3.6 2.9* 3.1* 4.5 3.5  CL 93* 93* 93*   < > 94* 98 102 95* 103  CO2 30 28 27    < > 29 28 29 25 27   GLUCOSE 241* 196* 307*   < > 238* 209* 147* 340* 87  BUN 40* 45* 53*   < > 45* 43* 39* 42* 43*  CREATININE 1.03 0.99 1.51*   < > 1.24 1.11 1.05 1.18 1.14  CALCIUM 8.5* 8.8* 8.3*   < > 8.7* 8.4* 8.4* 8.4* 8.4*  MG 2.5* 2.8* 2.5*  --   --   --   --  2.2  --    < > = values in this interval not displayed.   Liver Function Tests: Recent Labs  Lab 09/25/23 1139 09/25/23 2130  AST 25 21  ALT 24 19  ALKPHOS 76 65  BILITOT 0.9 0.5  PROT 7.0 5.8*  ALBUMIN 3.6 2.9*   No results for input(s): "LIPASE", "AMYLASE" in the last 168 hours. No results for input(s): "AMMONIA" in the last 168 hours. CBC: Recent Labs  Lab 09/25/23 2130  WBC 9.7  NEUTROABS 6.5  HGB 12.7*  HCT 39.0  MCV 84.8  PLT 300   Cardiac Enzymes: No results for input(s): "CKTOTAL", "CKMB", "CKMBINDEX", "TROPONINI" in the last 168 hours. BNP: BNP (last 3 results) Recent Labs    08/19/23 2232 09/17/23 0444  BNP 1,995.5* 2,499.1*    ProBNP (last 3 results) No results for input(s): "PROBNP" in the last 8760 hours.  CBG: Recent Labs  Lab 09/29/23 0836 09/29/23 1130 09/29/23 1724 09/29/23 2046 09/30/23 0748  GLUCAP 167* 381* 334* 132* 230*       Signed:  Zannie Cove MD.  [facility] 09/30/2023, 10:21 AM

## 2023-09-30 NOTE — Progress Notes (Signed)
Patient ID: William Dy., male   DOB: [birth year], 68 y.o.   MRN: [id number]      Advanced Heart Failure Rounding Note  Cardiologist: Julien Nordmann, MD  Chief Complaint: A fib RVR /Hyperthyroidism  Subjective:    1/16: Transfer from [facility] to [facility] for thyroid storm/A fib management. Continued on  methimazole and hydrocortisone IV and started on potassium iodine drops.  1/20: DCCV to NSR.    Patient remains in NSR.  Free T4 is slowly trending down.   No dyspnea. Wants to go home.   Objective:   Weight Range: 103.6 kg Body mass index is 31.87 kg/m.   Vital Signs:   Temp:  [97.8 F (36.6 C)-97.9 F (36.6 C)] 97.8 F (36.6 C) (01/21 0626) Pulse Rate:  [68-79] 70 (01/21 0626) Resp:  [12-24] 16 (01/21 0626) BP: (96-118)/(58-90) 97/58 (01/21 0626) SpO2:  [93 %-99 %] 98 % (01/21 0626) Weight:  [103.6 kg] 103.6 kg (01/21 0626) Last BM Date : 09/29/23  Weight change: Filed Weights   09/28/23 0500 09/29/23 0500 09/30/23 0626  Weight: 101.4 kg 101.2 kg 103.6 kg    Intake/Output:   Intake/Output Summary (Last 24 hours) at 09/30/2023 1000 Last data filed at 09/30/2023 0805 Gross per 24 hour  Intake 490 ml  Output --  Net 490 ml      Physical Exam    General: NAD Neck: No JVD, no thyromegaly or thyroid nodule.  Lungs: Clear to auscultation bilaterally with normal respiratory effort. CV: Nondisplaced PMI.  Heart regular S1/S2, no S3/S4, 3/6 SEM RUSB.  No peripheral edema.  Abdomen: Soft, nontender, no hepatosplenomegaly, no distention.  Skin: Intact without lesions or rashes.  Neurologic: Alert and oriented x 3.  Psych: Normal affect. Extremities: No clubbing or cyanosis.  HEENT: Normal.   Telemetry   NSR 70s (personally reviewed)  EKG    N/A  Labs    CBC No results for input(s): "WBC", "NEUTROABS", "HGB", "HCT", "MCV", "PLT" in the last 72 hours.  Basic Metabolic Panel Recent Labs    78/29/56 1346 09/30/23 0432  NA 132* 138  K 4.5 3.5  CL 95* 103   CO2 25 27  GLUCOSE 340* 87  BUN 42* 43*  CREATININE 1.18 1.14  CALCIUM 8.4* 8.4*  MG 2.2  --    Liver Function Tests No results for input(s): "AST", "ALT", "ALKPHOS", "BILITOT", "PROT", "ALBUMIN" in the last 72 hours.  No results for input(s): "LIPASE", "AMYLASE" in the last 72 hours. Cardiac Enzymes No results for input(s): "CKTOTAL", "CKMB", "CKMBINDEX", "TROPONINI" in the last 72 hours.  BNP: BNP (last 3 results) Recent Labs    08/19/23 2232 09/17/23 0444  BNP 1,995.5* 2,499.1*    ProBNP (last 3 results) No results for input(s): "PROBNP" in the last 8760 hours.   D-Dimer No results for input(s): "DDIMER" in the last 72 hours. Hemoglobin A1C No results for input(s): "HGBA1C" in the last 72 hours.  Fasting Lipid Panel No results for input(s): "CHOL", "HDL", "LDLCALC", "TRIG", "CHOLHDL", "LDLDIRECT" in the last 72 hours. Thyroid Function Tests Recent Labs    09/29/23 0400  T3FREE 4.5*     Other results:   Imaging    No results found.   Medications:     Scheduled Medications:  apixaban  5 mg Oral BID   benzonatate  200 mg Oral TID   Chlorhexidine Gluconate Cloth  6 each Topical Daily   digoxin  0.125 mg Oral Daily   empagliflozin  25 mg Oral Daily  FLUoxetine  40 mg Oral Daily   insulin aspart  0-15 Units Subcutaneous TID WC   insulin aspart  0-5 Units Subcutaneous QHS   insulin aspart  3 Units Subcutaneous TID WC   insulin glargine-yfgn  10 Units Subcutaneous Daily   methIMAzole  15 mg Oral TID   pantoprazole  40 mg Oral QHS   potassium chloride  40 mEq Oral Daily   pravastatin  40 mg Oral QHS   predniSONE  20 mg Oral Q breakfast   propranolol  10 mg Oral TID   sodium chloride flush  3 mL Intravenous Q12H   spironolactone  12.5 mg Oral Daily   torsemide  40 mg Oral Daily    Infusions:    PRN Medications: acetaminophen, guaiFENesin-dextromethorphan, ipratropium, menthol-cetylpyridinium, ondansetron (ZOFRAN) IV, mouth rinse, sodium  chloride flush, zolpidem    Patient Profile  68 y.o. male with history of PAF, chronic systolic CHF, hyperthyroidism, DM II, AS.  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    Assessment/Plan   1. 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  He was taking a lower dose of methimazole than prescribed and also did not get prednisone at home.  He had prior episode of AF in 2021, had been on amiodarone since that time.  Amiodarone was stopped in 12/24 with type 2 AIT.  Needs to get back into NSR though antiarrhythmic options are limited.  Would avoid amiodarone use unless absolutely necessary, and with persistently long QTc,  do not think that he will be able to take Tikosyn. Discussed Tikosyn with EP but QT long and felt to be high risk. TEE/DCCV 1/14 failed conversion x3. DCCV 1/20 was successful.  Free T4 continues to come down.  - No anti-arrhythmic option at this time.  - Continue propranolol 10 mg TID- BP soft. No room to titrate.  - Continue Eliquis 5 mg bid.  - Continue hyperthyroidism treatment.    - Ultimately, he should be evaluated for AF ablation.  2. 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 Thyroid stimulating antibodies were negative in 12/24, less likely type 1. He was taking lower than prescribed dose of methimazole at home and never got prednisone after last discharge.  Free T4 now trending down with treatment.  - Can decrease methimazole to 10 mg tid at discharge.  - Patient now on prednisone 20 mg daily, should go home on this dose.  He will need 1-3 months of prednisone as outpatient depending on trend of thyroid hormones.  He is on PPI, will discuss Bactrim prophylaxis with [facility], may be able to avoid as he does not have other risk for immunosuppression.  - Continue propranolol 10 mg TID. No BP room to increased.  - Followup  with endocrinology, should be arranged.    3. Acute on chronic systolic CHF: Presumed nonischemic cardiomyopathy, ?tachycardia-mediated with AF/RVR.  Echo in 3/21 with EF < 20%.  He was in atrial fibrillation with RVR at that time, converted to NSR on amiodarone.  Slow improvement in EF over time, echo in 4/24 with EF 40-45%, moderate aortic stenosis.  Recurrent AF/RVR in 12/24, echo with EF 20-25% with normal RV, mild-moderate MR, moderate AS with mean gradient 16 mmHg. Suspect fall in LV systolic function was tachycardia-mediated. He is now back in NSR.   Volume status looks ok today.  Low BP limits med titration.   -  Decrease torsemide to 20 mg daily.  - Add spironolactone 12.5 daily.  - Continue digoxin 0.125. Level good yesterday.   - Continue propranolol 10 mg TID for now, can transition to Coreg or Toprol XL when euthyroid in future.  - Continue Jardiance.  - If EF does not improve significantly in NSR, consider right/left heart cath in future.  4. Aortic stenosis: Moderate on echo this admission.  5. DM2: Watch glucose with steroid use. Can restart metformin at home.   OK for home today from my standpoint.  Will need followup in [facility].  Needs endocrinology followup scheduled.  Cardiac meds for home: torsemide 20 daily, KCl 20 daily, digoxin 0.125 daily, propranolol 10 tid, Jardiance 25 daily, spironolactone 12.5 daily.  He will need prednisone 20 daily and methimazole 10 tid for hyperthyroidism.    Length of Stay: 5  Marca Ancona, MD  09/30/2023, 10:00 AM  Advanced Heart Failure Team Pager [phone] (M-F; 7a - 5p)  Please contact [facility] for night-coverage after hours (5p -7a ) and weekends on [email or web address]

## 2023-10-01 LAB — T3, FREE: T3, Free: 3.9 pg/mL (ref 2.0–4.4)

## 2023-10-08 ENCOUNTER — Encounter: Payer: Managed Care, Other (non HMO) | Admitting: Cardiology

## 2023-10-20 LAB — ECHO TEE

## 2023-12-01 ENCOUNTER — Other Ambulatory Visit (HOSPITAL_COMMUNITY): Payer: Self-pay

## 2023-12-02 ENCOUNTER — Other Ambulatory Visit (HOSPITAL_COMMUNITY): Payer: Self-pay

## 2023-12-09 DEATH — deceased
